# Patient Record
Sex: Female | Born: 1985
Health system: Southern US, Community
[De-identification: ages and names within clinical notes are randomized; demographics above are authoritative.]

## PROBLEM LIST (undated history)

## (undated) ENCOUNTER — Inpatient Hospital Stay (HOSPITAL_COMMUNITY): Payer: Self-pay

## (undated) DIAGNOSIS — I493 Ventricular premature depolarization: Secondary | ICD-10-CM

## (undated) DIAGNOSIS — K219 Gastro-esophageal reflux disease without esophagitis: Secondary | ICD-10-CM

## (undated) DIAGNOSIS — G4730 Sleep apnea, unspecified: Secondary | ICD-10-CM

## (undated) DIAGNOSIS — T7840XA Allergy, unspecified, initial encounter: Secondary | ICD-10-CM

## (undated) DIAGNOSIS — R0600 Dyspnea, unspecified: Secondary | ICD-10-CM

## (undated) DIAGNOSIS — Z95810 Presence of automatic (implantable) cardiac defibrillator: Secondary | ICD-10-CM

## (undated) DIAGNOSIS — I519 Heart disease, unspecified: Secondary | ICD-10-CM

## (undated) DIAGNOSIS — I509 Heart failure, unspecified: Secondary | ICD-10-CM

## (undated) DIAGNOSIS — J189 Pneumonia, unspecified organism: Secondary | ICD-10-CM

## (undated) DIAGNOSIS — D649 Anemia, unspecified: Secondary | ICD-10-CM

## (undated) DIAGNOSIS — I428 Other cardiomyopathies: Secondary | ICD-10-CM

## (undated) HISTORY — PX: CARDIAC CATHETERIZATION: SHX172

## (undated) HISTORY — DX: Other cardiomyopathies: I42.8

## (undated) HISTORY — DX: Heart disease, unspecified: I51.9

## (undated) HISTORY — DX: Allergy, unspecified, initial encounter: T78.40XA

## (undated) HISTORY — DX: Ventricular premature depolarization: I49.3

## (undated) HISTORY — PX: WISDOM TOOTH EXTRACTION: SHX21

## (undated) HISTORY — DX: Pneumonia, unspecified organism: J18.9

---
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
## 2003-02-23 ENCOUNTER — Inpatient Hospital Stay (HOSPITAL_COMMUNITY): Admission: AD | Admit: 2003-02-23 | Discharge: 2003-02-23 | Payer: Self-pay | Admitting: *Deleted

## 2005-05-10 ENCOUNTER — Ambulatory Visit: Payer: Self-pay | Admitting: Family Medicine

## 2005-05-15 ENCOUNTER — Ambulatory Visit: Payer: Self-pay | Admitting: Family Medicine

## 2005-05-16 ENCOUNTER — Ambulatory Visit: Payer: Self-pay | Admitting: *Deleted

## 2005-07-09 ENCOUNTER — Encounter: Payer: Self-pay | Admitting: Family Medicine

## 2005-07-09 ENCOUNTER — Ambulatory Visit: Payer: Self-pay | Admitting: Family Medicine

## 2006-03-08 ENCOUNTER — Ambulatory Visit: Payer: Self-pay | Admitting: Internal Medicine

## 2006-05-17 ENCOUNTER — Ambulatory Visit: Payer: Self-pay | Admitting: Internal Medicine

## 2006-05-28 ENCOUNTER — Ambulatory Visit: Payer: Self-pay | Admitting: Family Medicine

## 2006-06-19 ENCOUNTER — Ambulatory Visit: Payer: Self-pay | Admitting: Family Medicine

## 2006-06-21 ENCOUNTER — Ambulatory Visit: Payer: Self-pay | Admitting: Internal Medicine

## 2006-12-20 ENCOUNTER — Ambulatory Visit: Payer: Self-pay | Admitting: Internal Medicine

## 2006-12-25 ENCOUNTER — Encounter (INDEPENDENT_AMBULATORY_CARE_PROVIDER_SITE_OTHER): Payer: Self-pay | Admitting: *Deleted

## 2007-04-23 ENCOUNTER — Ambulatory Visit: Payer: Self-pay | Admitting: Family Medicine

## 2007-04-23 ENCOUNTER — Encounter (INDEPENDENT_AMBULATORY_CARE_PROVIDER_SITE_OTHER): Payer: Self-pay | Admitting: Nurse Practitioner

## 2007-04-23 LAB — CONVERTED CEMR LAB
ALT: 17 units/L (ref 0–35)
Alkaline Phosphatase: 70 units/L (ref 39–117)
BUN: 13 mg/dL (ref 6–23)
Basophils Relative: 0 % (ref 0–1)
Chloride: 103 meq/L (ref 96–112)
Creatinine, Ser: 0.69 mg/dL (ref 0.40–1.20)
Glucose, Bld: 94 mg/dL (ref 70–99)
HCT: 37.2 % (ref 36.0–46.0)
Lymphocytes Relative: 50 % — ABNORMAL HIGH (ref 12–46)
Lymphs Abs: 3.3 10*3/uL (ref 0.7–4.0)
Monocytes Absolute: 0.5 10*3/uL (ref 0.1–1.0)
Neutrophils Relative %: 39 % — ABNORMAL LOW (ref 43–77)
RBC: 4.71 M/uL (ref 3.87–5.11)
Total Bilirubin: 0.3 mg/dL (ref 0.3–1.2)
Total Protein: 7.6 g/dL (ref 6.0–8.3)

## 2007-05-08 ENCOUNTER — Emergency Department (HOSPITAL_COMMUNITY): Admission: EM | Admit: 2007-05-08 | Discharge: 2007-05-08 | Payer: Self-pay | Admitting: Family Medicine

## 2007-05-13 ENCOUNTER — Ambulatory Visit: Payer: Self-pay | Admitting: Internal Medicine

## 2007-06-04 ENCOUNTER — Encounter (INDEPENDENT_AMBULATORY_CARE_PROVIDER_SITE_OTHER): Payer: Self-pay | Admitting: Nurse Practitioner

## 2007-06-04 ENCOUNTER — Ambulatory Visit: Payer: Self-pay | Admitting: Family Medicine

## 2007-06-04 LAB — CONVERTED CEMR LAB
LDL Cholesterol: 148 mg/dL — ABNORMAL HIGH (ref 0–99)
Total CHOL/HDL Ratio: 4.6
VLDL: 18 mg/dL (ref 0–40)

## 2007-07-30 ENCOUNTER — Ambulatory Visit: Payer: Self-pay | Admitting: Family Medicine

## 2007-09-22 ENCOUNTER — Ambulatory Visit: Payer: Self-pay | Admitting: Internal Medicine

## 2008-06-16 ENCOUNTER — Ambulatory Visit: Payer: Self-pay | Admitting: Family Medicine

## 2008-07-05 ENCOUNTER — Encounter (INDEPENDENT_AMBULATORY_CARE_PROVIDER_SITE_OTHER): Payer: Self-pay | Admitting: Internal Medicine

## 2008-07-05 ENCOUNTER — Ambulatory Visit: Payer: Self-pay | Admitting: Internal Medicine

## 2008-07-05 LAB — CONVERTED CEMR LAB
BUN: 9 mg/dL (ref 6–23)
Creatinine, Ser: 0.66 mg/dL (ref 0.40–1.20)
Eosinophils Absolute: 0.2 10*3/uL (ref 0.0–0.7)
GC Probe Amp, Genital: NEGATIVE
Hemoglobin: 11.7 g/dL — ABNORMAL LOW (ref 12.0–15.0)
Lymphocytes Relative: 42 % (ref 12–46)
Lymphs Abs: 3.2 10*3/uL (ref 0.7–4.0)
MCHC: 34.2 g/dL (ref 30.0–36.0)
Monocytes Relative: 5 % (ref 3–12)
Neutro Abs: 3.9 10*3/uL (ref 1.7–7.7)
Platelets: 346 10*3/uL (ref 150–400)
Potassium: 3.9 meq/L (ref 3.5–5.3)
Prolactin: 6.6 ng/mL
RBC: 4.43 M/uL (ref 3.87–5.11)
RDW: 14.3 % (ref 11.5–15.5)
Sodium: 140 meq/L (ref 135–145)
WBC: 7.7 10*3/uL (ref 4.0–10.5)

## 2008-07-13 ENCOUNTER — Ambulatory Visit (HOSPITAL_COMMUNITY): Admission: RE | Admit: 2008-07-13 | Discharge: 2008-07-13 | Payer: Self-pay | Admitting: Internal Medicine

## 2008-11-04 ENCOUNTER — Ambulatory Visit: Payer: Self-pay | Admitting: Obstetrics and Gynecology

## 2008-11-05 ENCOUNTER — Ambulatory Visit: Payer: Self-pay | Admitting: Obstetrics & Gynecology

## 2009-10-25 ENCOUNTER — Emergency Department (HOSPITAL_COMMUNITY): Admission: EM | Admit: 2009-10-25 | Discharge: 2009-10-25 | Payer: Self-pay | Admitting: Family Medicine

## 2010-08-22 NOTE — Group Therapy Note (Signed)
NAME:  Tara Bradshaw, MISHKIN               ACCOUNT NO.:  [id number]   MEDICAL RECORD NO.:  [id number]          PATIENT TYPE:  WOC   LOCATION:  [facility]                   FACILITY:  [facility]   PHYSICIAN:  Dorthula Perfect, MD     DATE OF BIRTH:  1985/09/08   DATE OF SERVICE:                                  CLINIC NOTE   A 25 year old African American female, nulligravida, last menstrual  period Aug 16, 2008, is referred here from [facility] with the  diagnosis of amenorrhea.   The patient's menarche was at about age 58.  She has never had regular  periods.  Her menstrual periods for the past number of years have taken  place every 2-3 months.  Her periods are every 2 months that lasting  anywhere from 5-7 days and her periods had a few months interval, the  menstrual period might last from 10-14 days.  She describes the periods  as heavy and does stain her clothes and sometimes the bed sheets.  She  has moderate cramping.  As part of her evaluation at [facility], she  had an ultrasound, which revealed a normal uterus, revealed both ovaries  to be of normal size, but containing multiple small predominantly  peripherally oriented follicles.  No dominant follicle was identified.  This was certainly compatible with polycystic ovarian disease.  A Pap  smear was done March of this year, which was normal.   This patient is not desirous at this time of becoming pregnant.  She and  her husband use condoms.  Being low income, which there needs to be a  consideration as to what laboratory work is done at this time.   FAMILY HISTORY:  Negative.  There is no history of diabetes or breast  cancer.   MEDICATIONS:  She is on no medications.   ALLERGIES:  She has no allergies.   PHYSICAL EXAMINATION:  VITAL SIGNS:  Height 5 feet 2 1 inch, 225 pounds.  Blood pressure 127/83.  GENERAL:  There is no abnormal facial hair.  Thyroid is normal.  BREAST:  Not done today.  ABDOMEN:  Somewhat obese and  nontender.  No masses are felt.  PELVIC:  External genitalia, BUS glands were normal.  Normal female hair  pattern.  Vaginal vault was epithelialized as normal.  Cervix was  epithelialized as normal.  Uterus is of normal size and shape.  The  ovaries are not palpable on either side because of her body habitus.   DIAGNOSES:  1. Secondary amenorrhea.  2. Probable polycystic ovarian disease.   DISPOSITION:  1. The patient will return in the morning fasting for a fasting blood      sugar.  2. Because of her low income status and not desirous of pregnancy at      this time.  I will not put her on metformin or which would      necessitate baseline liver function tests and the BUN and      creatinine.  3. I will not order at this time any endocrine lab work.  She will      return in the morning  for a fasting blood sugar.  I have given her      a prescription for Provera 10 mg ten tablets to take one a day for      next 10 days depending on her periods.  Following that she will      start Ortho Tri-Cyclen low.  She was given two sample packs and a      prescription for a year.           ______________________________  Dorthula Perfect, MD     ER/MEDQ  D:  11/04/2008  T:  11/05/2008  Job:  [id number]   cc:   Dala Dock

## 2010-11-09 ENCOUNTER — Other Ambulatory Visit: Payer: Self-pay | Admitting: Family Medicine

## 2010-12-28 LAB — POCT PREGNANCY, URINE
Operator id: 239701
Preg Test, Ur: NEGATIVE

## 2010-12-28 LAB — INFLUENZA A AND B ANTIGEN (CONVERTED LAB)
Inflenza A Ag: NEGATIVE
Influenza B Ag: NEGATIVE

## 2011-02-06 ENCOUNTER — Inpatient Hospital Stay (INDEPENDENT_AMBULATORY_CARE_PROVIDER_SITE_OTHER)
Admission: RE | Admit: 2011-02-06 | Discharge: 2011-02-06 | Disposition: A | Discharge status: Home / Self Care | Payer: Self-pay | Source: Ambulatory Visit | Attending: Family Medicine | Admitting: Family Medicine

## 2011-02-06 DIAGNOSIS — J069 Acute upper respiratory infection, unspecified: Secondary | ICD-10-CM

## 2011-10-13 ENCOUNTER — Encounter (HOSPITAL_COMMUNITY): Payer: Self-pay | Admitting: Emergency Medicine

## 2011-10-13 ENCOUNTER — Emergency Department (HOSPITAL_COMMUNITY)
Admission: EM | Admit: 2011-10-13 | Discharge: 2011-10-13 | Disposition: A | Discharge status: Home / Self Care | Payer: Self-pay | Attending: Emergency Medicine | Admitting: Emergency Medicine

## 2011-10-13 DIAGNOSIS — S01312A Laceration without foreign body of left ear, initial encounter: Secondary | ICD-10-CM

## 2011-10-13 DIAGNOSIS — S01309A Unspecified open wound of unspecified ear, initial encounter: Secondary | ICD-10-CM | POA: Insufficient documentation

## 2011-10-13 DIAGNOSIS — W540XXA Bitten by dog, initial encounter: Secondary | ICD-10-CM | POA: Insufficient documentation

## 2011-10-13 DIAGNOSIS — Z23 Encounter for immunization: Secondary | ICD-10-CM | POA: Insufficient documentation

## 2011-10-13 DIAGNOSIS — Z203 Contact with and (suspected) exposure to rabies: Secondary | ICD-10-CM | POA: Insufficient documentation

## 2011-10-13 MED ORDER — RABIES VACCINE, PCEC IM SUSR
1.0000 mL | Freq: Once | INTRAMUSCULAR | Status: AC
  Administered 2011-10-13: 1 mL via INTRAMUSCULAR
  Filled 2011-10-13: qty 1

## 2011-10-13 MED ORDER — LIDOCAINE HCL (PF) 1 % IJ SOLN: 5.0000 mL | Freq: Once | INTRAMUSCULAR | Status: DC

## 2011-10-13 MED ORDER — BACITRACIN ZINC 500 UNIT/GM EX OINT
1.0000 "application " | TOPICAL_OINTMENT | Freq: Two times a day (BID) | CUTANEOUS | Status: DC
  Filled 2011-10-13: qty 15

## 2011-10-13 MED ORDER — NAPROXEN 500 MG PO TABS: 500.0000 mg | ORAL_TABLET | Freq: Two times a day (BID) | ORAL | Status: DC

## 2011-10-13 MED ORDER — RABIES IMMUNE GLOBULIN 150 UNIT/ML IM INJ
2100.0000 [IU] | INJECTION | Freq: Once | INTRAMUSCULAR | Status: AC
  Administered 2011-10-13: 2100 [IU] via INTRAMUSCULAR
  Filled 2011-10-13: qty 14

## 2011-10-13 MED ORDER — BACITRACIN ZINC 500 UNIT/GM EX OINT: TOPICAL_OINTMENT | Freq: Two times a day (BID) | CUTANEOUS | Status: AC

## 2011-10-13 MED ORDER — AMOXICILLIN-POT CLAVULANATE 875-125 MG PO TABS: 1.0000 | ORAL_TABLET | Freq: Two times a day (BID) | ORAL | Status: AC

## 2011-10-13 NOTE — ED Notes (Signed)
Pt states she was bitten on the left ear  by a stray dog at midnite.  States last tetanus 2 yrs ago,.  No known history of dog.   States left ear pain at Left lower lobe bit site 5/10, non bleeding at present.  NS irrigation in progress

## 2011-10-13 NOTE — ED Notes (Signed)
Pt states she was running from a stray dog just pta and fell.  Reports dog bit her L ear.  Denies injury/pain from fall.  Wound noted to L ear.  Bleeding controlled pta.  Unknown last DT. No information known on dog.

## 2011-10-13 NOTE — ED Provider Notes (Signed)
History     CSN: [id number]  Arrival date & time 10/13/11  0216   First MD Initiated Contact with Patient 10/13/11 [phone]      Chief Complaint  Patient presents with  . Animal Bite    (Consider location/radiation/quality/duration/timing/severity/associated sxs/prior treatment) HPI Comments: 26 year old female but states that she was bitten by a stray dog on her left ear. She states this occurred approximately 11:45 in the evening. This was acute in onset, the pain is constant, worse with palpation and not associated with any other injuries. She states that she did not know this dog, she was unable to run faster than a dog and when she tripped, the dog bit her on her left ear. She denies any significant bleeding and states she is up-to-date on tetanus within 2 years.  Patient is a 26 y.o. female presenting with animal bite. The history is provided by the patient and the spouse.  Animal Bite  Pertinent negatives include no numbness, no vomiting and no weakness.    History reviewed. No pertinent past medical history.  History reviewed. No pertinent past surgical history.  No family history on file.  History  Substance Use Topics  . Smoking status: Never Smoker   . Smokeless tobacco: Not on file  . Alcohol Use: No    OB History    Grav Para Term Preterm Abortions TAB SAB Ect Mult Living                  Review of Systems  Constitutional: Negative for fever.  Gastrointestinal: Negative for vomiting.  Skin: Positive for wound.       Laceration  Neurological: Negative for weakness and numbness.    Allergies  Review of patient's allergies indicates no known allergies.  Home Medications   Current Outpatient Rx  Name Route Sig Dispense Refill  . FERROUS GLUCONATE 324 (38 FE) MG PO TABS Oral Take 324 mg by mouth daily with breakfast.    . BACITRACIN ZINC 500 UNIT/GM EX OINT Topical Apply topically 2 (two) times daily. 15 g 0  . NAPROXEN 500 MG PO TABS Oral Take 1 tablet  (500 mg total) by mouth 2 (two) times daily with a meal. 30 tablet 0    BP 153/102  Pulse 82  Temp 98.1 F (36.7 C)  Wt 230 lb (104.327 kg)  SpO2 100%  LMP 09/09/2011  Physical Exam  Constitutional: She appears well-developed and well-nourished. No distress.  HENT:  Head: Normocephalic.       Laceration to the left earlobe  Eyes: Conjunctivae are normal. No scleral icterus.  Cardiovascular: Normal rate and regular rhythm.   Pulmonary/Chest: Effort normal and breath sounds normal.  Musculoskeletal: Normal range of motion. She exhibits tenderness ( ttp over the laceration site). She exhibits no edema.  Neurological: She is alert. Coordination normal.       Sensation and motor intact  Skin: Skin is warm and dry. She is not diaphoretic.       Laceration located on left earlobe The Laceration is linear and zig zag shaped The depth is superficial The length is 3 cm    ED Course  Procedures (including critical care time)  Labs Reviewed - No data to display No results found.   1. Laceration of left external ear   2. Dog bite       MDM  The location of the laceration makes it difficult to repair given its earlobe location wrapping around to the posterior portion of the  ear. Copious irrigation, rabies prophylaxis, tetanus is up-to-date.  LACERATION REPAIR Performed by: Vida Roller Authorized by: Vida Roller Consent: Verbal consent obtained. Risks and benefits: risks, benefits and alternatives were discussed Consent given by: patient Patient identity confirmed: provided demographic data Prepped and Draped in normal sterile fashion Wound explored  Laceration Location: Left earlobe  Laceration Length: 3 cm  No Foreign Bodies seen or palpated  Anesthesia: local infiltration  Local anesthetic: lidocaine 1 % without epinephrine perform by block of the entire left ear   Anesthetic total: 6 ml  Irrigation method: syringe Amount of cleaning: standard  Skin  closure: 6-0 Prolene   Number of sutures: 7   Technique: Simple interrupted   Patient tolerance: Patient tolerated the procedure well with no immediate complications.   I discussed with the patient at length the need for followup, I have given her a prescription for an antibiotic should her wounds become red or inflamed, she has expressed her understanding of the indications for return including signs of infection. She has had copious urination of more than 2 L of normal saline through the wound of her ear and there is no particulate matter in the wounds which have been explored to the base.   Vida Roller, MD 10/13/11 [phone]

## 2012-03-10 ENCOUNTER — Inpatient Hospital Stay (HOSPITAL_COMMUNITY)
Admission: AD | Admit: 2012-03-10 | Discharge: 2012-03-10 | Disposition: A | Discharge status: Home / Self Care | Payer: Medicaid Other | Source: Ambulatory Visit | Attending: Obstetrics & Gynecology | Admitting: Obstetrics & Gynecology

## 2012-03-10 ENCOUNTER — Encounter (HOSPITAL_COMMUNITY): Payer: Self-pay | Admitting: Family

## 2012-03-10 ENCOUNTER — Inpatient Hospital Stay (HOSPITAL_COMMUNITY): Payer: Medicaid Other

## 2012-03-10 DIAGNOSIS — O2390 Unspecified genitourinary tract infection in pregnancy, unspecified trimester: Secondary | ICD-10-CM

## 2012-03-10 DIAGNOSIS — O210 Mild hyperemesis gravidarum: Secondary | ICD-10-CM

## 2012-03-10 DIAGNOSIS — Z3490 Encounter for supervision of normal pregnancy, unspecified, unspecified trimester: Secondary | ICD-10-CM

## 2012-03-10 DIAGNOSIS — R109 Unspecified abdominal pain: Secondary | ICD-10-CM | POA: Insufficient documentation

## 2012-03-10 DIAGNOSIS — B9689 Other specified bacterial agents as the cause of diseases classified elsewhere: Secondary | ICD-10-CM | POA: Insufficient documentation

## 2012-03-10 DIAGNOSIS — N760 Acute vaginitis: Secondary | ICD-10-CM | POA: Insufficient documentation

## 2012-03-10 DIAGNOSIS — A499 Bacterial infection, unspecified: Secondary | ICD-10-CM | POA: Insufficient documentation

## 2012-03-10 HISTORY — DX: Anemia, unspecified: D64.9

## 2012-03-10 LAB — URINALYSIS, ROUTINE W REFLEX MICROSCOPIC
Bilirubin Urine: NEGATIVE
Glucose, UA: NEGATIVE mg/dL
Ketones, ur: NEGATIVE mg/dL
Leukocytes, UA: NEGATIVE
Specific Gravity, Urine: 1.02 (ref 1.005–1.030)
pH: 7.5 (ref 5.0–8.0)

## 2012-03-10 LAB — CBC
HCT: 33.7 % — ABNORMAL LOW (ref 36.0–46.0)
Hemoglobin: 11.4 g/dL — ABNORMAL LOW (ref 12.0–15.0)
MCH: 26.9 pg (ref 26.0–34.0)
MCHC: 33.8 g/dL (ref 30.0–36.0)
MCV: 79.5 fL (ref 78.0–100.0)
RDW: 13.5 % (ref 11.5–15.5)

## 2012-03-10 LAB — WET PREP, GENITAL
WBC, Wet Prep HPF POC: NONE SEEN
Yeast Wet Prep HPF POC: NONE SEEN

## 2012-03-10 LAB — OB RESULTS CONSOLE GC/CHLAMYDIA
Chlamydia: NEGATIVE
Gonorrhea: NEGATIVE

## 2012-03-10 LAB — HCG, QUANTITATIVE, PREGNANCY: hCG, Beta Chain, Quant, S: 39357 m[IU]/mL — ABNORMAL HIGH (ref <5)

## 2012-03-10 MED ORDER — PROMETHAZINE HCL 25 MG PO TABS: 25.0000 mg | ORAL_TABLET | Freq: Four times a day (QID) | ORAL | Status: DC | PRN

## 2012-03-10 MED ORDER — METRONIDAZOLE 500 MG PO TABS: 500.0000 mg | ORAL_TABLET | Freq: Two times a day (BID) | ORAL | Status: DC

## 2012-03-10 NOTE — MAU Provider Note (Signed)
History     CSN: [id number]  Arrival date and time: 03/10/12 1128   First Provider Initiated Contact with Patient 03/10/12 1217      Chief Complaint  Patient presents with  . Possible Pregnancy  . Vaginal Pain  . Abdominal Pain  . Rectal Pain  . Nausea   HPI This is a 26 y.o. female at [redacted]w[redacted]d who presents with c/o nausea, abdominal and breast pain. Has never been pregnant, is not on birth control. Was diagnosed many years ago with PCOS, though no testing was done due to finances. Has always had regular periods however, but has not gotten pregnant before, even in absence of contraception.  RN Note: Patient states she has been having nausea for a while, no vomiting. Started having abdominal, vaginal, rectal and breast pain for couple of weeks. Has missed her period since September.       OB History    Grav Para Term Preterm Abortions TAB SAB Ect Mult Living   1               Past Medical History  Diagnosis Date  . Anemia     Past Surgical History  Procedure Date  . Wisdom tooth extraction     History reviewed. No pertinent family history.  History  Substance Use Topics  . Smoking status: Never Smoker   . Smokeless tobacco: Not on file  . Alcohol Use: No    Allergies: No Known Allergies  Prescriptions prior to admission  Medication Sig Dispense Refill  . Pediatric Multivitamins-Iron (FLINTSTONES PLUS IRON PO) Take 2 tablets by mouth daily.        ROS See HPI  Physical Exam   Blood pressure 126/74, pulse 69, temperature 98 F (36.7 C), temperature source Oral, resp. rate 18, height 5' 2.5" (1.588 m), weight 230 lb 12.8 oz (104.69 kg), last menstrual period 12/12/2011, SpO2 100.00%.  Physical Exam  Constitutional: She is oriented to person, place, and time. She appears well-developed and well-nourished. No distress.  HENT:  Head: Normocephalic.  Cardiovascular: Normal rate.   Respiratory: Effort normal.  GI: Soft. She exhibits no distension and no  mass. There is no tenderness. There is no rebound and no guarding.  Genitourinary: Uterus normal. No vaginal discharge found.       Uterus enlarged, but difficult to feel due to habitus Bedside US, able to see gestational sac, but unclear as to contents   Musculoskeletal: Normal range of motion.  Neurological: She is alert and oriented to person, place, and time.  Skin: Skin is warm and dry.  Psychiatric: She has a normal mood and affect.   Results for orders placed during the hospital encounter of 03/10/12 (from the past 24 hour(s))  URINALYSIS, ROUTINE W REFLEX MICROSCOPIC     Status: Normal   Collection Time   03/10/12 11:45 AM      Component Value Range   Color, Urine YELLOW  YELLOW   APPearance CLEAR  CLEAR   Specific Gravity, Urine 1.020  1.005 - 1.030   pH 7.5  5.0 - 8.0   Glucose, UA NEGATIVE  NEGATIVE mg/dL   Hgb urine dipstick NEGATIVE  NEGATIVE   Bilirubin Urine NEGATIVE  NEGATIVE   Ketones, ur NEGATIVE  NEGATIVE mg/dL   Protein, ur NEGATIVE  NEGATIVE mg/dL   Urobilinogen, UA 0.2  0.0 - 1.0 mg/dL   Nitrite NEGATIVE  NEGATIVE   Leukocytes, UA NEGATIVE  NEGATIVE  POCT PREGNANCY, URINE     Status: Abnormal  Collection Time   03/10/12 11:53 AM      Component Value Range   Preg Test, Ur POSITIVE (*) NEGATIVE  POCT PREGNANCY, URINE     Status: Abnormal   Collection Time   03/10/12 12:18 PM      Component Value Range   Preg Test, Ur POSITIVE (*) NEGATIVE  WET PREP, GENITAL     Status: Abnormal   Collection Time   03/10/12 12:25 PM      Component Value Range   Yeast Wet Prep HPF POC NONE SEEN  NONE SEEN   Trich, Wet Prep NONE SEEN  NONE SEEN   Clue Cells Wet Prep HPF POC FEW (*) NONE SEEN   WBC, Wet Prep HPF POC NONE SEEN  NONE SEEN  CBC     Status: Abnormal   Collection Time   03/10/12 12:27 PM      Component Value Range   WBC 8.7  4.0 - 10.5 K/uL   RBC 4.24  3.87 - 5.11 MIL/uL   Hemoglobin 11.4 (*) 12.0 - 15.0 g/dL   HCT 16.1 (*) 09.6 - 04.5 %   MCV 79.5  78.0 -  100.0 fL   MCH 26.9  26.0 - 34.0 pg   MCHC 33.8  30.0 - 36.0 g/dL   RDW 40.9  81.1 - 91.4 %   Platelets 286  150 - 400 K/uL  HCG, QUANTITATIVE, PREGNANCY     Status: Abnormal   Collection Time   03/10/12 12:28 PM      Component Value Range   hCG, Beta Chain, Quant, S 78295 (*) <5 mIU/mL    MAU Course  Procedures  MDM Will check Quant, CBC and [place] >> SIUP at 8 wks with [facility] 10/20/12 Rx Phenergan US Ob Comp Less 14 Wks    Assessment and Plan  A:  SIUP at 8 weeks       Nausea of pregnancy       BV, mild  P:  Discharge      Rx Phenergan      Advised to start prenatal care        Medication List     As of 03/10/2012  6:44 PM    START taking these medications         metroNIDAZOLE 500 MG tablet   Commonly known as: FLAGYL   Take 1 tablet (500 mg total) by mouth 2 (two) times daily.      promethazine 25 MG tablet   Commonly known as: PHENERGAN   Take 1 tablet (25 mg total) by mouth every 6 (six) hours as needed for nausea.      CONTINUE taking these medications         FLINTSTONES PLUS IRON PO          Where to get your medications    These are the prescriptions that you need to pick up. We sent them to a specific pharmacy, so you will need to go there to get them.   [id number] [place], [place] - [address]    [address] [place] [address]    Phone: [phone]        metroNIDAZOLE 500 MG tablet   promethazine 25 MG tablet             Domnick Chervenak 03/10/2012, 1:10 PM

## 2012-03-10 NOTE — MAU Note (Signed)
Pt reports missed period, LMP 12/12/11; generalized lower abdominal cramping and breasts are tender.

## 2012-03-10 NOTE — MAU Note (Signed)
Patient states she has been having nausea for a while, no vomiting. Started having abdominal, vaginal, rectal and breast pain for couple of weeks. Has missed her period since September.

## 2012-03-10 NOTE — Progress Notes (Signed)
03/10/12 1500  Clinical Encounter Type  Visited With Patient  Visit Type Initial;Spiritual support;Social support  Referral From Nurse  Spiritual Encounters  Spiritual Needs Emotional  Stress Factors  Patient Stress Factors Major life changes    Thank you to RN Christy for referral to provide support to Ms Tara Bradshaw upon her learning of a positive pregnancy.  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  Provided opportunity for her to share and process her thoughts and feelings about her pregnancy news and to begin to imagine her future as a parent.  Served as pastoral presence, witness to her story, pastoral reflection partner.  Also let her know of support available from Spiritual Care upon subsequent visits/admissions.  Pt receptive, appreciative.  [address] [place], [place] [phone]

## 2012-03-11 LAB — GC/CHLAMYDIA PROBE AMP
CT Probe RNA: NEGATIVE
GC Probe RNA: NEGATIVE

## 2012-03-26 ENCOUNTER — Ambulatory Visit (INDEPENDENT_AMBULATORY_CARE_PROVIDER_SITE_OTHER): Payer: Medicaid Other | Admitting: Obstetrics and Gynecology

## 2012-03-26 DIAGNOSIS — Z36 Encounter for antenatal screening of mother: Secondary | ICD-10-CM

## 2012-03-26 DIAGNOSIS — Z331 Pregnant state, incidental: Secondary | ICD-10-CM

## 2012-03-26 LAB — POCT URINALYSIS DIPSTICK
Bilirubin, UA: NEGATIVE
Blood, UA: NEGATIVE
Clarity, UA: NEGATIVE
Glucose, UA: NEGATIVE
Nitrite, UA: NEGATIVE
Spec Grav, UA: 1.015
Urobilinogen, UA: NEGATIVE

## 2012-03-26 MED ORDER — FUSION PLUS PO CAPS: 1.0000 | ORAL_CAPSULE | Freq: Every day | ORAL | Status: DC

## 2012-03-26 NOTE — Progress Notes (Signed)
NOB Interview. Pt needs to sign ROI for family member at [place]. Completed but did not sign.

## 2012-03-26 NOTE — Addendum Note (Signed)
Addended by: Malissa Hippo. on: 03/26/2012 11:46 PM   Modules accepted: Orders

## 2012-03-27 LAB — PRENATAL PANEL VII
Antibody Screen: NEGATIVE
Basophils Absolute: 0 10*3/uL (ref 0.0–0.1)
Basophils Relative: 0 % (ref 0–1)
Eosinophils Absolute: 0.2 10*3/uL (ref 0.0–0.7)
Eosinophils Relative: 2 % (ref 0–5)
HCT: 32.3 % — ABNORMAL LOW (ref 36.0–46.0)
HIV: NONREACTIVE
Hemoglobin: 10.6 g/dL — ABNORMAL LOW (ref 12.0–15.0)
MCH: 26.3 pg (ref 26.0–34.0)
MCHC: 32.8 g/dL (ref 30.0–36.0)
MCV: 80.1 fL (ref 78.0–100.0)
Monocytes Absolute: 0.6 10*3/uL (ref 0.1–1.0)
Monocytes Relative: 7 % (ref 3–12)
RDW: 15.3 % (ref 11.5–15.5)
Rh Type: POSITIVE

## 2012-03-28 ENCOUNTER — Other Ambulatory Visit: Payer: Self-pay | Admitting: Obstetrics and Gynecology

## 2012-03-28 ENCOUNTER — Telehealth: Payer: Self-pay | Admitting: Obstetrics and Gynecology

## 2012-03-28 LAB — CULTURE, OB URINE: Colony Count: NO GROWTH

## 2012-03-28 LAB — HEMOGLOBINOPATHY EVALUATION
Hgb A2 Quant: 2.6 % (ref 2.2–3.2)
Hgb F Quant: 0 % (ref 0.0–2.0)

## 2012-03-28 NOTE — Telephone Encounter (Signed)
Message copied by Mason Jim on Fri Mar 28, 2012 12:26 PM ------      Message from: Malissa Hippo.      Created: Wed Mar 26, 2012 11:46 PM      Regarding: Please call pt to notify hgb low, FE suppl ordered, sent to pharmacy                    ----- Message -----         From: Constance Haw, RN         Sent: 03/26/2012  12:29 PM           To: Malissa Hippo, CNM

## 2012-03-28 NOTE — Telephone Encounter (Signed)
TC to pt. Informed of Hg results and Rx for Fe. Advised to eat Fe-rich foods and take Rx with Vit C source not dairy. Pt verbalizes comprehension.

## 2012-04-09 NOTE — L&D Delivery Note (Signed)
Delivery Note At 3:16 PM a viable female was delivered via Vaginal, Spontaneous Delivery (Presentation: Left Occiput Anterior).  Loose nuchal and body cord, summersault maneuver done and nuchal cord easily reduced after delivery.  APGAR: 9, 9; weight 6 lb 7.2 oz (2925 g).  Placenta status: Intact, Spontaneous.  Cord: 3 vessels with the following complications: None.  Cord pH: not done  Anesthesia: Epidural  Episiotomy: None Lacerations: None Suture Repair: n/a Est. Blood Loss (mL): 200  Mom to postpartum.  Baby to skin to skin immediately after delivery. Desires in-office circ Desires Mi[employer] for [employer] 10/14/2012, 5:01 PM

## 2012-04-14 ENCOUNTER — Ambulatory Visit (INDEPENDENT_AMBULATORY_CARE_PROVIDER_SITE_OTHER): Payer: Medicaid Other

## 2012-04-14 ENCOUNTER — Other Ambulatory Visit: Payer: Self-pay | Admitting: Obstetrics and Gynecology

## 2012-04-14 DIAGNOSIS — Z36 Encounter for antenatal screening of mother: Secondary | ICD-10-CM

## 2012-04-21 ENCOUNTER — Encounter: Payer: Self-pay | Admitting: Obstetrics and Gynecology

## 2012-04-21 ENCOUNTER — Ambulatory Visit (INDEPENDENT_AMBULATORY_CARE_PROVIDER_SITE_OTHER): Payer: Medicaid Other | Admitting: Obstetrics and Gynecology

## 2012-04-21 VITALS — BP 110/64 | Temp 97.3°F | Wt 229.0 lb

## 2012-04-21 DIAGNOSIS — R05 Cough: Secondary | ICD-10-CM

## 2012-04-21 DIAGNOSIS — Z3689 Encounter for other specified antenatal screening: Secondary | ICD-10-CM

## 2012-04-21 DIAGNOSIS — O0930 Supervision of pregnancy with insufficient antenatal care, unspecified trimester: Secondary | ICD-10-CM | POA: Insufficient documentation

## 2012-04-21 DIAGNOSIS — Z124 Encounter for screening for malignant neoplasm of cervix: Secondary | ICD-10-CM

## 2012-04-21 DIAGNOSIS — Z331 Pregnant state, incidental: Secondary | ICD-10-CM

## 2012-04-21 DIAGNOSIS — E669 Obesity, unspecified: Secondary | ICD-10-CM

## 2012-04-21 DIAGNOSIS — Z3490 Encounter for supervision of normal pregnancy, unspecified, unspecified trimester: Secondary | ICD-10-CM

## 2012-04-21 DIAGNOSIS — R059 Cough, unspecified: Secondary | ICD-10-CM

## 2012-04-21 DIAGNOSIS — R053 Chronic cough: Secondary | ICD-10-CM

## 2012-04-21 LAB — POCT URINALYSIS DIPSTICK
Blood, UA: NEGATIVE
Glucose, UA: NEGATIVE
Nitrite, UA: NEGATIVE
Spec Grav, UA: 1.02
Urobilinogen, UA: NEGATIVE
pH, UA: 6

## 2012-04-21 MED ORDER — HYDROCOD POLST-CHLORPHEN POLST 10-8 MG/5ML PO LQCR: 5.0000 mL | Freq: Two times a day (BID) | ORAL | Status: DC

## 2012-04-21 NOTE — Progress Notes (Signed)
[redacted]w[redacted]d Last pap smear 2011 WNL LMP 01/11/2012 Declines Flu vaccine today due to cold symptoms. OTC meds not working. Pt complains of bad cough x 1 month with sneezing.

## 2012-04-21 NOTE — Progress Notes (Signed)
[facility] NEW OB EXAMINATION   Tara Bradshaw is a 27 y.o. female, G1P0, who presents at [redacted]w[redacted]d gestation for a new obstetrical examination. Her last menstrual period is not certain.  She had an ultrasound at the [facility] on March 10, 2012 at which time she was [redacted] weeks pregnant. Her assigned a due date was October 20, 2012.  She was seen for nausea and vomiting.  This is now resolved. The patient complains of a chronic cough.  Robitussin has not been helpful.  The following portions of the patient's history were reviewed and updated as appropriate: allergies, current medications, past family history, past medical history, past social history, past surgical history and problem list.  OB History    Grav Para Term Preterm Abortions TAB SAB Ect Mult Living   1               Past Medical History  Diagnosis Date  . Pneumonia     x 1  . Infection     BV X 1  . Anemia     CHRONIC    Past Surgical History  Procedure Date  . Wisdom tooth extraction     Family History  Problem Relation Age of Onset  . Epilepsy Mother   . Cancer Maternal Aunt 52    BREAST  . Hypertension Maternal Grandmother   . Cancer Paternal Grandmother     BREAST    Social History:  reports that she has never smoked. She has never used smokeless tobacco. She reports that she does not drink alcohol or use illicit drugs.  Allergies: No Known Allergies  Medications: prenatal vitamins   Objective:    BP 110/64  Temp 97.3 F (36.3 C)  Wt 229 lb (103.874 kg)  LMP 12/12/2011    Weight:  Wt Readings from Last 1 Encounters:  04/21/12 229 lb (103.874 kg)          BMI: There is no height on file to calculate BMI.  General Appearance: Alert, appropriate appearance for age. No acute distress HEENT: Grossly normal Neck / Thyroid: Supple, no masses, nodes or enlargement Lungs: clear to auscultation bilaterally Back: No CVA tenderness Breast Exam: No masses or nodes.No dimpling, nipple  retraction or discharge. Cardiovascular: Regular rate and rhythm. S1, S2, no murmur Gastrointestinal: Soft, non-tender, no masses or organomegaly.                               Fundal height: 14 weeks                               Fetal heart tones audible: yes  ++++++++++++++++++++++++++++++++++++++++++++++++++++++++  Pelvic Exam: External genitalia: normal general appearance Vaginal: normal without tenderness, induration or masses and relaxation: No Cervix: normal appearance Adnexa: normal bimanual exam Uterus: gravid, nontender, 14 weeks size  ++++++++++++++++++++++++++++++++++++++++++++++++++++++++  Lymphatic Exam: Non-palpable nodes in neck, clavicular, axillary, or inguinal regions Neurologic: Normal speech, no tremor  Psychiatric: Alert and oriented, appropriate affect.  Prenatal labs: ABO, Rh: O/POS/-- (12/18 1149) Antibody: NEG (12/18 1149) Rubella:  immune RPR: NON REAC (12/18 1149)  HBsAg: NEGATIVE (12/18 1149)  HIV: NON REACTIVE (12/18 1149)  GBS:   pending until the third trimester Gonorrhea: Negative Chlamydia: Negative Sickle cell: Normal adult hemoglobin Urine culture: Negative Wet prep: Clue cells (treated with metronidazole) First trimester screen: Normal Hemoglobin: 10.6 Platelets: 300,000  Wet  Prep:   Previously done:            yes                 Assessment:   27 y.o. female G1P0 at [redacted]w[redacted]d gestation ( EDC is October 20, 2012) by: Ultrasound:  March 10, 2012 at [redacted] weeks gestation.                                                             Obesity  Anemia  Late prenatal care  Chronic cough probably secondary to viral illness   Plan:    Pap smear sent (last pain in 2011)  We discussed routine pregnancy issues:  Toxoplasmosis was reviewed.  The patient was told to avoid cat liter boxes and feces.  The patient was told to avoid predator fish including tuna because of our concerns for mercury consumption.  The patient was told to avoid soft  cheeses.  The patient was told to be sure that all lunch meats are well cooked.  Genetic screening was discussed. Normal first trimester screen  Our model for pregnancy management was reviewed.  Proper diet and exercise reviewed.  Return to office in 4 weeks.  Medications include:  Prenatal vitamins Tussionex as needed for cough.  Mylinda Latina.D.

## 2012-04-22 LAB — PAP IG, CT-NG, RFX HPV ASCU
Chlamydia Probe Amp: NEGATIVE
GC Probe Amp: NEGATIVE

## 2012-04-23 ENCOUNTER — Telehealth: Payer: Self-pay | Admitting: Obstetrics and Gynecology

## 2012-04-23 ENCOUNTER — Telehealth: Payer: Self-pay

## 2012-04-23 NOTE — Telephone Encounter (Signed)
Pt stated Rx that was called in for her cough/cold is not covered by Medicaid. ( Tussionex) Rx is $80.00 Pt needs something cheaper. Will consult w/ AVS.   LC CMA

## 2012-04-23 NOTE — Telephone Encounter (Signed)
avs pt 

## 2012-04-23 NOTE — Telephone Encounter (Signed)
Tussionex was printed on Monday. Rx needed to be called in  Tussionex called into pt pharm. Pt called to be made aware  Pt not ava LMOVM  LC CMA

## 2012-04-24 ENCOUNTER — Telehealth: Payer: Self-pay | Admitting: Obstetrics and Gynecology

## 2012-04-24 NOTE — Telephone Encounter (Signed)
Pt made aware per AVS to try Robitussin AC 10ml every 4-6 hrs.  Pt agreeable   LC CMA

## 2012-05-01 ENCOUNTER — Encounter: Payer: Self-pay | Admitting: Internal Medicine

## 2012-05-01 ENCOUNTER — Telehealth: Payer: Self-pay | Admitting: Obstetrics and Gynecology

## 2012-05-01 ENCOUNTER — Ambulatory Visit (INDEPENDENT_AMBULATORY_CARE_PROVIDER_SITE_OTHER): Payer: Medicaid Other | Admitting: Internal Medicine

## 2012-05-01 VITALS — BP 122/60 | HR 78 | Temp 98.3°F | Ht 62.0 in | Wt 231.8 lb

## 2012-05-01 DIAGNOSIS — R05 Cough: Secondary | ICD-10-CM

## 2012-05-01 DIAGNOSIS — R059 Cough, unspecified: Secondary | ICD-10-CM

## 2012-05-01 MED ORDER — ACETAMINOPHEN-CODEINE #3 300-30 MG PO TABS: ORAL_TABLET | ORAL | Status: DC

## 2012-05-01 MED ORDER — LANSOPRAZOLE 30 MG PO CPDR: DELAYED_RELEASE_CAPSULE | ORAL | Status: DC

## 2012-05-01 NOTE — Progress Notes (Signed)
  Subjective:    Patient ID: Tara Bradshaw, female    DOB: 03/16/1986   MRN: [id number]  HPI  69 yobf never smoker referred by Nigel Bridgeman during 1st prgenancy for refractory cough.    05/01/2012 1st pulmonary eval cc cough x 10 weeks and pregnant x 16 weeks, cough is daily, assoc with nasal congestion and sneezing but no problem before pregnancy assoc with dry cough to point of choking and vomiting.  Worse starting 8pm and right thru the night but better in am until around noon. No  Better with delsym, rob, afrin  No obvious daytime variabilty or assoc  cp or chest tightness, subjective wheeze overt sinus or hb symptoms. No unusual exp hx. Only really sob when couging   a. Also denies any obvious fluctuation of symptoms with weather or environmental changes or other aggravating or alleviating factors except as outlined above   No previous h/o allergies or asthma or prolonged cough with uri's       Review of Systems  Constitutional: Negative for fever, chills and unexpected weight change.  HENT: Positive for congestion and sneezing. Negative for ear pain, nosebleeds, sore throat, rhinorrhea, trouble swallowing, dental problem, voice change, postnasal drip and sinus pressure.   Eyes: Negative for visual disturbance.  Respiratory: Positive for cough and shortness of breath. Negative for choking.   Cardiovascular: Negative for chest pain and leg swelling.  Gastrointestinal: Negative for vomiting, abdominal pain and diarrhea.  Genitourinary: Negative for difficulty urinating.  Musculoskeletal: Negative for arthralgias.  Skin: Negative for rash.  Neurological: Negative for tremors, syncope and headaches.  Hematological: Does not bruise/bleed easily.       Objective:   Physical Exam  Wt Readings from Last 3 Encounters:  05/01/12 231 lb 12.8 oz (105.144 kg)  04/21/12 229 lb (103.874 kg)  03/10/12 230 lb 12.8 oz (104.69 kg)    HEENT: nl dentition,  and orophanx. Nl external ear  canals without cough reflex- mod non-specific turbinate edema bilaterally no purulent secretions   NECK :  without JVD/Nodes/TM/ nl carotid upstrokes bilaterally   LUNGS: no acc muscle use, clear to A and P bilaterally without cough on insp or exp maneuvers   CV:  RRR  no s3 or murmur or increase in P2, no edema   ABD:  soft and nontender c/w [redacted] week gestation with nl excursion in the supine position. No bruits or organomegaly, bowel sounds nl  MS:  warm without deformities, calf tenderness, cyanosis or clubbing  SKIN: warm and dry without lesions    NEURO:  alert, approp, no deficits        Assessment & Plan:

## 2012-05-01 NOTE — Patient Instructions (Addendum)
Prevacid 30 mg Take 30- 60 min before your first and last meals of the day   GERD (REFLUX)  is an extremely common cause of respiratory symptoms, many times with no significant heartburn at all.    It can be treated with medication, but also with lifestyle changes including avoidance of late meals, excessive alcohol, smoking cessation, and avoid fatty foods, chocolate, peppermint, colas, red wine, and acidic juices such as orange juice.  NO MINT OR MENTHOL PRODUCTS SO NO COUGH DROPS  USE SUGARLESS CANDY INSTEAD (jolley ranchers or Stover's)  NO OIL BASED VITAMINS - use powdered substitutes.   If still coughing ok to use Tylenol #3 one every 4 hours as needed  Please schedule a follow up office visit in 4 weeks, sooner if needed

## 2012-05-01 NOTE — Telephone Encounter (Signed)
Tc to Tara Bradshaw regarding msg.  Tara Bradshaw is 15 weeks and has a persistent cough that has not gone away with taking Robitussin and Delsym and Tussinex, Tessalon Pearls are not covered by the Tara Bradshaw's insurance.  Tara Bradshaw has had the cough since about 6 wks of her pregnancy.  Cough is a dry/hacking cough. Tara Bradshaw does not have a hx of asthma.  Per VL, refer Tara Bradshaw to [facility] pulmonology to see if she has asthma, Tara Bradshaw voices agreement.  Appt scheduled for today @ 1600, Tara Bradshaw to arrive @ 1615 to see Dr. Sandrea Hughs.

## 2012-05-02 ENCOUNTER — Telehealth: Payer: Self-pay | Admitting: Internal Medicine

## 2012-05-02 NOTE — Telephone Encounter (Signed)
I spoke with pt and advised her it is bc it is OTC being why insurance will not cover medication. She voiced her understanding and needed nothing further.

## 2012-05-04 DIAGNOSIS — R05 Cough: Secondary | ICD-10-CM | POA: Insufficient documentation

## 2012-05-04 DIAGNOSIS — R059 Cough, unspecified: Secondary | ICD-10-CM | POA: Insufficient documentation

## 2012-05-04 NOTE — Assessment & Plan Note (Signed)
Cough onset  @ around [redacted] weeks gestation typical of  Classic Upper airway cough syndrome, so named because it's frequently impossible to sort out how much is  CR/sinusitis with freq throat clearing (which can be related to primary GERD)   vs  causing  secondary (" extra esophageal")  GERD from wide swings in gastric pressure that occur with throat clearing, often  promoting self use of mint and menthol lozenges that reduce the lower esophageal sphincter tone and exacerbate the problem further in a cyclical fashion.   These are the same pts (now being labeled as having "irritable larynx syndrome" by some cough centers) who not infrequently have a history of having failed to tolerate ace inhibitors,  dry powder inhalers or biphosphonates or report having atypical reflux symptoms that don't respond to standard doses of PPI , and are easily confused as having aecopd or asthma flares by even experienced allergists/ pulmonologists.     clearly refluxing if she coughs so hard she gags and vomits and this can't be good for her baby so no real options here other than cough suppression with codeine and max acid rx - See instructions for specific recommendations which were reviewed directly with the patient who was given a copy with highlighter outlining the key components.

## 2012-05-13 ENCOUNTER — Telehealth: Payer: Self-pay | Admitting: Obstetrics and Gynecology

## 2012-05-13 NOTE — Telephone Encounter (Signed)
Pt called, is 17 weeks, was recently dx'd w/ GERD after a referral by this office.  Has been unable to keep anything down b/c when she coughs she vomits.  Pt was given Prevacid and Tylenol w/ Codeine for relief, however pt has only taken the Prevacid and has not taken the Tylenol w/ Codeine b/c she is afraid it will harm the baby.  Pt advised it is ok to take, that is a med that we rx to pregnant pt's frequently if needed and she is in her 2nd trimester which makes it safer.  Pt voices agreement, will try the Tylenol #3 and the Prevacid as suggested and will call back if no relief.

## 2012-05-19 ENCOUNTER — Ambulatory Visit: Payer: Medicaid Other

## 2012-05-19 ENCOUNTER — Ambulatory Visit: Payer: Medicaid Other | Admitting: Obstetrics and Gynecology

## 2012-05-19 ENCOUNTER — Encounter: Payer: Self-pay | Admitting: Obstetrics and Gynecology

## 2012-05-19 VITALS — BP 110/72 | Wt 226.0 lb

## 2012-05-19 DIAGNOSIS — Z331 Pregnant state, incidental: Secondary | ICD-10-CM

## 2012-05-19 DIAGNOSIS — Z3689 Encounter for other specified antenatal screening: Secondary | ICD-10-CM

## 2012-05-19 NOTE — Progress Notes (Signed)
[redacted]w[redacted]d No complaints per pt  Ultrasound shows:  SIUP  S=D     [place] EDD:10/20/12           AFI: fluid is normal (vertical pocket 3.9 cm)           Cervical length: 3.30 cm           Placenta localization: right lateral placenta (placenta edge is 6.2 cm from internal OS-normal)           Fetal presentation: vertex                   Anatomy survey is normal           Gender : female  Posterior intramural fibroid = 3.1 x 2.2 x 2.6 cm

## 2012-05-19 NOTE — Progress Notes (Signed)
[redacted]w[redacted]d Doing well. Declines a AFP screen. Ultrasound: Single gestation, normal anatomy, normal fluid, female, 8 ounces (54th percentile), cervix equals 3.3 cm. Return to office in 4 weeks. Dr. Stefano Gaul

## 2012-05-23 LAB — US OB COMP + 14 WK

## 2012-05-24 ENCOUNTER — Other Ambulatory Visit: Payer: Self-pay

## 2012-06-02 ENCOUNTER — Ambulatory Visit: Payer: Medicaid Other | Admitting: Internal Medicine

## 2012-06-16 ENCOUNTER — Ambulatory Visit: Payer: Medicaid Other | Admitting: Certified Nurse Midwife

## 2012-06-16 ENCOUNTER — Encounter: Payer: Medicaid Other | Admitting: Obstetrics and Gynecology

## 2012-06-16 VITALS — BP 112/68 | Wt 230.0 lb

## 2012-06-16 DIAGNOSIS — Z331 Pregnant state, incidental: Secondary | ICD-10-CM

## 2012-06-16 NOTE — Progress Notes (Signed)
[redacted]w[redacted]d Pt only reports sciatic discomfort and nipple dryness and sensitivity.  Reassurance given and comfort measure options offered. Rv'd glucola for next visit if done. Will RTC in 4 weeks.

## 2012-06-16 NOTE — Progress Notes (Signed)
Pt stated her nipples been very dry been going on for about a month . Pt no other issues today.

## 2012-06-25 ENCOUNTER — Ambulatory Visit: Payer: Medicaid Other | Admitting: Internal Medicine

## 2012-07-09 ENCOUNTER — Encounter: Payer: Self-pay | Admitting: Family Medicine

## 2012-09-17 ENCOUNTER — Inpatient Hospital Stay (HOSPITAL_COMMUNITY)
Admission: AD | Admit: 2012-09-17 | Discharge: 2012-09-17 | Disposition: A | Discharge status: Home / Self Care | Payer: Medicaid Other | Source: Ambulatory Visit | Attending: Obstetrics and Gynecology | Admitting: Obstetrics and Gynecology

## 2012-09-17 ENCOUNTER — Encounter (HOSPITAL_COMMUNITY): Payer: Self-pay

## 2012-09-17 DIAGNOSIS — M549 Dorsalgia, unspecified: Secondary | ICD-10-CM | POA: Insufficient documentation

## 2012-09-17 DIAGNOSIS — R112 Nausea with vomiting, unspecified: Secondary | ICD-10-CM

## 2012-09-17 DIAGNOSIS — O99891 Other specified diseases and conditions complicating pregnancy: Secondary | ICD-10-CM | POA: Insufficient documentation

## 2012-09-17 DIAGNOSIS — O212 Late vomiting of pregnancy: Secondary | ICD-10-CM | POA: Insufficient documentation

## 2012-09-17 DIAGNOSIS — R109 Unspecified abdominal pain: Secondary | ICD-10-CM | POA: Insufficient documentation

## 2012-09-17 LAB — COMPREHENSIVE METABOLIC PANEL
Alkaline Phosphatase: 105 U/L (ref 39–117)
BUN: 3 mg/dL — ABNORMAL LOW (ref 6–23)
Creatinine, Ser: 0.41 mg/dL — ABNORMAL LOW (ref 0.50–1.10)
GFR calc Af Amer: >90 mL/min (ref >90)
Glucose, Bld: 85 mg/dL (ref 70–99)
Potassium: 3.6 mEq/L (ref 3.5–5.1)
Total Bilirubin: 0.1 mg/dL — ABNORMAL LOW (ref 0.3–1.2)
Total Protein: 5.9 g/dL — ABNORMAL LOW (ref 6.0–8.3)

## 2012-09-17 LAB — CBC WITH DIFFERENTIAL/PLATELET
Eosinophils Absolute: 0.1 10*3/uL (ref 0.0–0.7)
HCT: 36.4 % (ref 36.0–46.0)
Hemoglobin: 9.5 g/dL — ABNORMAL LOW (ref 12.0–15.0)
Lymphs Abs: 1.7 10*3/uL (ref 0.7–4.0)
MCH: 25.9 pg — ABNORMAL LOW (ref 26.0–34.0)
MCHC: 26.1 g/dL — ABNORMAL LOW (ref 30.0–36.0)
MCV: 99.2 fL (ref 78.0–100.0)
Monocytes Absolute: 0.5 10*3/uL (ref 0.1–1.0)
Monocytes Relative: 8 % (ref 3–12)
Neutrophils Relative %: 66 % (ref 43–77)
RBC: 3.67 MIL/uL — ABNORMAL LOW (ref 3.87–5.11)

## 2012-09-17 LAB — URINALYSIS, ROUTINE W REFLEX MICROSCOPIC
Bilirubin Urine: NEGATIVE
Glucose, UA: NEGATIVE mg/dL
Hgb urine dipstick: NEGATIVE
Ketones, ur: NEGATIVE mg/dL
Nitrite: NEGATIVE
Specific Gravity, Urine: 1.02 (ref 1.005–1.030)
pH: 7 (ref 5.0–8.0)

## 2012-09-17 MED ORDER — RANITIDINE HCL 150 MG PO TABS: 150.0000 mg | ORAL_TABLET | Freq: Two times a day (BID) | ORAL | Status: DC

## 2012-09-17 MED ORDER — LACTATED RINGERS IV SOLN
Freq: Once | INTRAVENOUS | Status: AC
  Administered 2012-09-17: 13:00:00 via INTRAVENOUS

## 2012-09-17 MED ORDER — PROMETHAZINE HCL 25 MG/ML IJ SOLN
12.5000 mg | INTRAMUSCULAR | Status: DC | PRN
  Administered 2012-09-17: 12.5 mg via INTRAVENOUS
  Filled 2012-09-17: qty 1

## 2012-09-17 MED ORDER — PROMETHAZINE HCL 25 MG PO TABS: 25.0000 mg | ORAL_TABLET | Freq: Four times a day (QID) | ORAL | Status: DC | PRN

## 2012-09-17 NOTE — MAU Note (Signed)
Patient state she has been having nauusea and vomiting for 24 hours. Not able to keep anything down since 1800 last night. Started having abdominal cramping yesterday with some back pain. Denies bleeding or leaking and reports good fetal movement.

## 2012-09-17 NOTE — MAU Note (Signed)
Patient is in with n/v lower back and abdominal pain since last night. She denies diarrhea, vaginal bleeding or lof. She reports good fetal movement.

## 2012-10-13 ENCOUNTER — Encounter (HOSPITAL_COMMUNITY): Payer: Self-pay

## 2012-10-13 ENCOUNTER — Inpatient Hospital Stay (HOSPITAL_COMMUNITY)
Admission: RE | Admit: 2012-10-13 | Discharge: 2012-10-16 | DRG: 775 | Disposition: A | Discharge status: Home / Self Care | Payer: Medicaid Other | Source: Ambulatory Visit | Attending: Obstetrics and Gynecology | Admitting: Obstetrics and Gynecology

## 2012-10-13 DIAGNOSIS — O9902 Anemia complicating childbirth: Secondary | ICD-10-CM | POA: Diagnosis present

## 2012-10-13 DIAGNOSIS — D649 Anemia, unspecified: Secondary | ICD-10-CM | POA: Diagnosis present

## 2012-10-13 DIAGNOSIS — IMO0002 Reserved for concepts with insufficient information to code with codable children: Secondary | ICD-10-CM | POA: Diagnosis present

## 2012-10-13 DIAGNOSIS — O365990 Maternal care for other known or suspected poor fetal growth, unspecified trimester, not applicable or unspecified: Principal | ICD-10-CM | POA: Diagnosis present

## 2012-10-13 LAB — COMPREHENSIVE METABOLIC PANEL
ALT: 15 U/L (ref 0–35)
CO2: 22 mEq/L (ref 19–32)
Calcium: 9.5 mg/dL (ref 8.4–10.5)
Creatinine, Ser: 0.45 mg/dL — ABNORMAL LOW (ref 0.50–1.10)
GFR calc Af Amer: >90 mL/min (ref >90)
GFR calc non Af Amer: >90 mL/min (ref >90)
Glucose, Bld: 91 mg/dL (ref 70–99)
Sodium: 137 mEq/L (ref 135–145)
Total Bilirubin: 0.2 mg/dL — ABNORMAL LOW (ref 0.3–1.2)

## 2012-10-13 LAB — CBC
MCH: 26.5 pg (ref 26.0–34.0)
MCHC: 34.1 g/dL (ref 30.0–36.0)
Platelets: 213 10*3/uL (ref 150–400)

## 2012-10-13 LAB — PROTEIN / CREATININE RATIO, URINE: Creatinine, Urine: 57.5 mg/dL

## 2012-10-13 LAB — RPR: RPR Ser Ql: NONREACTIVE

## 2012-10-13 LAB — OB RESULTS CONSOLE GBS: GBS: NEGATIVE

## 2012-10-13 MED ORDER — FENTANYL CITRATE 0.05 MG/ML IJ SOLN
100.0000 ug | INTRAMUSCULAR | Status: DC | PRN
  Administered 2012-10-13 – 2012-10-14 (×4): 100 ug via INTRAVENOUS
  Filled 2012-10-13 (×6): qty 2

## 2012-10-13 MED ORDER — CITRIC ACID-SODIUM CITRATE 334-500 MG/5ML PO SOLN: 30.0000 mL | ORAL | Status: DC | PRN

## 2012-10-13 MED ORDER — LACTATED RINGERS IV SOLN
INTRAVENOUS | Status: DC
  Administered 2012-10-13: 900 mL via INTRAVENOUS
  Administered 2012-10-14 (×3): via INTRAVENOUS

## 2012-10-13 MED ORDER — LACTATED RINGERS IV SOLN
500.0000 mL | INTRAVENOUS | Status: DC | PRN
  Administered 2012-10-14: 500 mL via INTRAVENOUS

## 2012-10-13 MED ORDER — OXYCODONE-ACETAMINOPHEN 5-325 MG PO TABS: 1.0000 | ORAL_TABLET | ORAL | Status: DC | PRN

## 2012-10-13 MED ORDER — TERBUTALINE SULFATE 1 MG/ML IJ SOLN: 0.2500 mg | Freq: Once | INTRAMUSCULAR | Status: AC | PRN

## 2012-10-13 MED ORDER — LABETALOL HCL 5 MG/ML IV SOLN
10.0000 mg | INTRAVENOUS | Status: DC | PRN
  Filled 2012-10-13: qty 8

## 2012-10-13 MED ORDER — ACETAMINOPHEN 325 MG PO TABS: 650.0000 mg | ORAL_TABLET | ORAL | Status: DC | PRN

## 2012-10-13 MED ORDER — OXYTOCIN 40 UNITS IN LACTATED RINGERS INFUSION - SIMPLE MED: 62.5000 mL/h | INTRAVENOUS | Status: DC

## 2012-10-13 MED ORDER — DIPHENHYDRAMINE HCL 50 MG/ML IJ SOLN: 12.5000 mg | INTRAMUSCULAR | Status: DC | PRN

## 2012-10-13 MED ORDER — FENTANYL 2.5 MCG/ML BUPIVACAINE 1/10 % EPIDURAL INFUSION (WH - ANES)
14.0000 mL/h | INTRAMUSCULAR | Status: DC | PRN
  Administered 2012-10-14: 14 mL/h via EPIDURAL
  Filled 2012-10-13 (×2): qty 125

## 2012-10-13 MED ORDER — EPHEDRINE 5 MG/ML INJ
10.0000 mg | INTRAVENOUS | Status: DC | PRN
  Filled 2012-10-13: qty 4

## 2012-10-13 MED ORDER — IBUPROFEN 600 MG PO TABS
600.0000 mg | ORAL_TABLET | Freq: Four times a day (QID) | ORAL | Status: DC | PRN
  Administered 2012-10-14: 600 mg via ORAL
  Filled 2012-10-13: qty 1

## 2012-10-13 MED ORDER — FLEET ENEMA 7-19 GM/118ML RE ENEM: 1.0000 | ENEMA | RECTAL | Status: DC | PRN

## 2012-10-13 MED ORDER — EPHEDRINE 5 MG/ML INJ: 10.0000 mg | INTRAVENOUS | Status: DC | PRN

## 2012-10-13 MED ORDER — OXYTOCIN BOLUS FROM INFUSION: 500.0000 mL | INTRAVENOUS | Status: DC

## 2012-10-13 MED ORDER — LACTATED RINGERS IV SOLN
500.0000 mL | Freq: Once | INTRAVENOUS | Status: AC
  Administered 2012-10-14: 500 mL via INTRAVENOUS

## 2012-10-13 MED ORDER — MISOPROSTOL 25 MCG QUARTER TABLET
25.0000 ug | ORAL_TABLET | Freq: Once | ORAL | Status: AC
  Administered 2012-10-13: 25 ug via VAGINAL
  Filled 2012-10-13: qty 0.25

## 2012-10-13 MED ORDER — OXYTOCIN 40 UNITS IN LACTATED RINGERS INFUSION - SIMPLE MED
1.0000 m[IU]/min | INTRAVENOUS | Status: DC
  Administered 2012-10-13: 2 m[IU]/min via INTRAVENOUS
  Administered 2012-10-13: 1 m[IU]/min via INTRAVENOUS
  Administered 2012-10-13: 4 m[IU]/min via INTRAVENOUS
  Administered 2012-10-13: 8 m[IU]/min via INTRAVENOUS
  Administered 2012-10-14: 4 m[IU]/min via INTRAVENOUS
  Administered 2012-10-14: 5 m[IU]/min via INTRAVENOUS
  Administered 2012-10-14: 666 m[IU]/min via INTRAVENOUS
  Filled 2012-10-13 (×2): qty 1000

## 2012-10-13 MED ORDER — LIDOCAINE HCL (PF) 1 % IJ SOLN
30.0000 mL | INTRAMUSCULAR | Status: DC | PRN
  Filled 2012-10-13: qty 30

## 2012-10-13 MED ORDER — PHENYLEPHRINE 40 MCG/ML (10ML) SYRINGE FOR IV PUSH (FOR BLOOD PRESSURE SUPPORT)
80.0000 ug | PREFILLED_SYRINGE | INTRAVENOUS | Status: DC | PRN
  Filled 2012-10-13: qty 5

## 2012-10-13 MED ORDER — LABETALOL HCL 5 MG/ML IV SOLN: 10.0000 mg | Freq: Once | INTRAVENOUS | Status: DC

## 2012-10-13 MED ORDER — ONDANSETRON HCL 4 MG/2ML IJ SOLN
4.0000 mg | Freq: Four times a day (QID) | INTRAMUSCULAR | Status: DC | PRN
  Administered 2012-10-14: 4 mg via INTRAVENOUS
  Filled 2012-10-13: qty 2

## 2012-10-13 MED ORDER — PHENYLEPHRINE 40 MCG/ML (10ML) SYRINGE FOR IV PUSH (FOR BLOOD PRESSURE SUPPORT): 80.0000 ug | PREFILLED_SYRINGE | INTRAVENOUS | Status: DC | PRN

## 2012-10-13 NOTE — Progress Notes (Signed)
Patient ID: Tara Bradshaw, female   DOB: 26-Mar-1986, 27 y.o.   MRN: [id number] Tara Bradshaw is a 27 y.o. G1P0 at [redacted]w[redacted]d admitted for IOL   Subjective: C/o feeling more ctx now in her back, currently resting in L ex sims position, rcv'd 1 dose cytotec at 8am.   Objective: BP 147/86  Pulse 73  Temp(Src) 98.4 F (36.9 C) (Oral)  Resp 18  Ht 5\' 2"  (1.575 m)  Wt 243 lb (110.224 kg)  BMI 44.43 kg/m2  LMP 12/12/2011     FHT:  FHR: 130 bpm, variability: moderate,  accelerations:  Present,  decelerations:  Absent had a run of late decels about an hour ago, that have resolved  UC:   Irregular, not tracing well  SVE:   Dilation: 2 Effacement (%): Thick Station: -2 Exam by:: Deletha Jaffee  Foley balloon placed without difficulty, inflated 60cc and taped with mild traction to pt's inner thigh   Assessment / Plan: IOL  Labor: cervical ripening, will start pitocin now w foley balloon  Preeclampsia:  no s/s, labs normal, BP stable, prot/creat ratio .12  Fetal Wellbeing:  Category I Pain Control:  desires IV meds Anticipated MOD:  NSVD  Recheck after foley comes out   Update physician PRN   Malissa Hippo 10/13/2012, 2:07 PM

## 2012-10-13 NOTE — Progress Notes (Signed)
Patient ID: Tara Bradshaw, female   DOB: 1985/06/13, 27 y.o.   MRN: [id number] Tara Bradshaw is a 27 y.o. G1P0 at 110w0d admitted for IOL   Subjective: Requesting IV pain meds, good relief w fentanyl earlier, declines epidural at present   Objective: BP 147/78  Pulse 74  Temp(Src) 98.6 F (37 C) (Oral)  Resp 18  Ht 5\' 2"  (1.575 m)  Wt 243 lb (110.224 kg)  BMI 44.43 kg/m2  LMP 12/12/2011     FHT:  FHR: 130 bpm, variability: minimal ,  accelerations:  Abscent,  decelerations:  Absent, occ periods of mod variability, occ 10x10 accel  UC:   regular, every 3-4 minutes, not tracing well at times  SVE:   Dilation: 4 Effacement (%): 90 Station: -2 Exam by:: Jola Critzer, CNM  Foley balloon removed   Assessment / Plan: IOL for growth restriction, foley balloon now out, will continue to titrate pitocin   Labor: Progressing on Pitocin, will continue to increase then AROM Preeclampsia:  no s/s, BP stable Fetal Wellbeing:  Category II Pain Control:  Fentanyl Anticipated MOD:  NSVD  Recheck 2-3hrs, AROM/IUPC   Update physician PRN   Malissa Hippo 10/13/2012, 7:57 PM

## 2012-10-13 NOTE — H&P (Signed)
Tara Bradshaw is a 27 y.o. female presenting for IOL secondary to persistent AC lag. She denies any ctx, vag bleeding or LOF, reports GFM. Denies HA/N/V/RUQ pain  HPI: Pt began PNC at [facility] at 14wks, EDC based on 8wk [place] =7/14 1st trimester screen WNL, declined AFP,  Anatomy scan at 18wks WNL [place] at 36wks for S<D, noted growth at 30%, decreased AFI and AC lag, BPP 8/8 F/u US at 38wks growth 16%, w worsening AC lag at 3%, normal AFI, BPP 8/8   Maternal Medical History:  Reason for admission: Nausea. IOL at 39wks for [name]  Fetal activity: Perceived fetal activity is normal.   Last perceived fetal movement was within the past hour.    Prenatal complications: AC lag  Prenatal Complications - Diabetes: none.    OB History   Grav Para Term Preterm Abortions TAB SAB Ect Mult Living   1              Past Medical History  Diagnosis Date  . Pneumonia     x 1  . Infection     BV X 1  . Anemia     CHRONIC   Past Surgical History  Procedure Laterality Date  . Wisdom tooth extraction     Family History: family history includes Asthma in her mother; Cancer in her paternal grandmother; Cancer (age of onset: 74) in her maternal aunt; Epilepsy in her mother; and Hypertension in her maternal grandmother. Social History:  reports that she has never smoked. She has never used smokeless tobacco. She reports that she does not drink alcohol or use illicit drugs.   Prenatal Transfer Tool  Maternal Diabetes: No Genetic Screening: Normal Maternal Ultrasounds/Referrals: Normal Fetal Ultrasounds or other Referrals:  Referred to Materal Fetal Medicine  persistent AC lag, overall growth is normal  Maternal Substance Abuse:  No Significant Maternal Medications:  None Significant Maternal Lab Results:  Lab values include: Group B Strep negative Other Comments:  None  Review of Systems  Eyes: Negative for blurred vision.  Respiratory: Negative for shortness of breath.   Cardiovascular:  Negative for chest pain.  Gastrointestinal: Negative for heartburn, nausea, vomiting and abdominal pain.  Neurological: Negative for headaches.  All other systems reviewed and are negative.    Dilation: 2 Effacement (%): Thick Station: -2 Exam by:: dherr rn Blood pressure 144/78, pulse 79, temperature 98.2 F (36.8 C), temperature source Oral, resp. rate 20, height 5\' 2"  (1.575 Bradshaw), weight 243 lb (110.224 kg), last menstrual period 12/12/2011. Maternal Exam:  Abdomen: Patient reports no abdominal tenderness. Fundal height is aga.   Estimated fetal weight is 6-6.   Fetal presentation: vertex  Introitus: Normal vulva. Normal vagina.  Pelvis: adequate for delivery.   Cervix: Cervix evaluated by digital exam.     Fetal Exam Fetal Monitor Review: Mode: ultrasound.   Baseline rate: 130.  Variability: moderate (6-25 bpm).   Pattern: accelerations present and no decelerations.    Fetal State Assessment: Category I - tracings are normal.     Physical Exam  Nursing note and vitals reviewed. Constitutional: She is oriented to person, place, and time. She appears well-developed and well-nourished.  HENT:  Head: Normocephalic.  Eyes: Pupils are equal, round, and reactive to light.  Neck: Normal range of motion.  Cardiovascular: Normal rate, regular rhythm and normal heart sounds.   Respiratory: Effort normal and breath sounds normal.  GI: Soft. Bowel sounds are normal.  Genitourinary: Vagina normal.  Musculoskeletal: Normal range of  motion.  Neurological: She is alert and oriented to person, place, and time. She has normal reflexes.  Skin: Skin is warm and dry.  Psychiatric: She has a normal mood and affect. Her behavior is normal.    Prenatal labs: ABO, Rh: --/--/O POS (07/07 1610) Antibody: NEG (07/07 9604) Rubella: 1.18 (12/18 1149) RPR: NON REAC (12/18 1149)  HBsAg: NEGATIVE (12/18 1149)  HIV: NON REACTIVE (12/18 1149)  GBS:   neg - 6-21 GC/CT neg Pap WNL Sickle cell  - normal  1hr gtt 94, hgb 9.3, RPR NR H/H - 9.4/28.3 on 6-5   Assessment/Plan: IUP at 39wks AC lag at 3% overall growth 16% EFW 6#6oz Chronic anemia GBS neg FHR reassuring Mildly elevated BP Unfavorable cervix  Admit to b.s. Per c/w DR Rivard Routine L&D orders cytotec PV q4h, may use foley balloon and pitocin when appropriate Will check PIH labs and protein/creatine ratio     Tara Bradshaw 10/13/2012, 10:23 AM

## 2012-10-13 NOTE — Progress Notes (Signed)
Patient ID: Tara Bradshaw, female   DOB: Dec 31, 1985, 27 y.o.   MRN: [id number] Tara Bradshaw is a 27 y.o. G1P0 at [redacted]w[redacted]d admitted for IOL due to [facility] lag  Subjective: No c/o, denies HA/N/V/RUQ pain,   Objective: BP 144/78  Pulse 79  Temp(Src) 98.2 F (36.8 C) (Oral)  Resp 20  Ht 5\' 2"  (1.575 m)  Wt 243 lb (110.224 kg)  BMI 44.43 kg/m2  LMP 12/12/2011    Filed Vitals:   10/13/12 0650 10/13/12 0654 10/13/12 0801  BP: 152/92 137/90 144/78  Pulse: 83 81 79  Temp: 98.2 F (36.8 C)    TempSrc: Oral    Resp: 20    Height: 5\' 2"  (1.575 m)    Weight: 243 lb (110.224 kg)       FHT:  FHR: 130 bpm, variability: moderate,  accelerations:  Present,  decelerations:  Absent UC:   none SVE:   Dilation: 2 Effacement (%): Thick Station: -2 Exam by:: dherr rn    Assessment / Plan: IOL at 39wks due to [facility] lag  Labor: s/p cytotec at 0800 for cervical ripening  Preeclampsia:  no s/s, will check labs and prot/creat ratio, mildly elevated BP's  Fetal Wellbeing:  Category I Pain Control:  n/a Anticipated MOD:  NSVD GBS neg Recheck at noon, consider repeat cytotec/foley balloon or pitocin when appropriate  Update physician PRN   Teagen Mcleary M 10/13/2012, 9:50 AM

## 2012-10-14 ENCOUNTER — Encounter (HOSPITAL_COMMUNITY): Payer: Self-pay | Admitting: Anesthesiology

## 2012-10-14 ENCOUNTER — Encounter (HOSPITAL_COMMUNITY): Payer: Self-pay

## 2012-10-14 ENCOUNTER — Inpatient Hospital Stay (HOSPITAL_COMMUNITY): Payer: Medicaid Other | Admitting: Anesthesiology

## 2012-10-14 DIAGNOSIS — IMO0002 Reserved for concepts with insufficient information to code with codable children: Secondary | ICD-10-CM | POA: Diagnosis present

## 2012-10-14 LAB — COMPREHENSIVE METABOLIC PANEL
ALT: 13 U/L (ref 0–35)
AST: 21 U/L (ref 0–37)
Albumin: 2.6 g/dL — ABNORMAL LOW (ref 3.5–5.2)
Alkaline Phosphatase: 129 U/L — ABNORMAL HIGH (ref 39–117)
BUN: <3 mg/dL — ABNORMAL LOW (ref 6–23)
Chloride: 102 mEq/L (ref 96–112)
Potassium: 3.7 mEq/L (ref 3.5–5.1)
Sodium: 136 mEq/L (ref 135–145)
Total Bilirubin: 0.5 mg/dL (ref 0.3–1.2)

## 2012-10-14 LAB — URIC ACID: Uric Acid, Serum: 3.7 mg/dL (ref 2.4–7.0)

## 2012-10-14 LAB — CBC
HCT: 31.6 % — ABNORMAL LOW (ref 36.0–46.0)
Hemoglobin: 10 g/dL — ABNORMAL LOW (ref 12.0–15.0)
MCHC: 33.7 g/dL (ref 30.0–36.0)
Platelets: 208 10*3/uL (ref 150–400)
RDW: 15.3 % (ref 11.5–15.5)
RDW: 15.3 % (ref 11.5–15.5)
WBC: 19.7 10*3/uL — ABNORMAL HIGH (ref 4.0–10.5)

## 2012-10-14 LAB — PROTEIN / CREATININE RATIO, URINE: Protein Creatinine Ratio: 0.29 — ABNORMAL HIGH (ref 0.00–0.15)

## 2012-10-14 MED ORDER — OXYCODONE-ACETAMINOPHEN 5-325 MG PO TABS: 1.0000 | ORAL_TABLET | ORAL | Status: DC | PRN

## 2012-10-14 MED ORDER — BENZOCAINE-MENTHOL 20-0.5 % EX AERO: 1.0000 "application " | INHALATION_SPRAY | CUTANEOUS | Status: DC | PRN

## 2012-10-14 MED ORDER — LANOLIN HYDROUS EX OINT: TOPICAL_OINTMENT | CUTANEOUS | Status: DC | PRN

## 2012-10-14 MED ORDER — SIMETHICONE 80 MG PO CHEW: 80.0000 mg | CHEWABLE_TABLET | ORAL | Status: DC | PRN

## 2012-10-14 MED ORDER — FENTANYL 2.5 MCG/ML BUPIVACAINE 1/10 % EPIDURAL INFUSION (WH - ANES)
INTRAMUSCULAR | Status: DC | PRN
  Administered 2012-10-14: 14 mL/h via EPIDURAL

## 2012-10-14 MED ORDER — SENNOSIDES-DOCUSATE SODIUM 8.6-50 MG PO TABS
2.0000 | ORAL_TABLET | Freq: Every day | ORAL | Status: DC
  Administered 2012-10-14: 2 via ORAL

## 2012-10-14 MED ORDER — ONDANSETRON HCL 4 MG PO TABS: 4.0000 mg | ORAL_TABLET | ORAL | Status: DC | PRN

## 2012-10-14 MED ORDER — WITCH HAZEL-GLYCERIN EX PADS: 1.0000 "application " | MEDICATED_PAD | CUTANEOUS | Status: DC | PRN

## 2012-10-14 MED ORDER — FENTANYL CITRATE 0.05 MG/ML IJ SOLN
INTRAMUSCULAR | Status: DC | PRN
  Administered 2012-10-14 (×2): 50 ug via EPIDURAL

## 2012-10-14 MED ORDER — PRENATAL MULTIVITAMIN CH
1.0000 | ORAL_TABLET | Freq: Every day | ORAL | Status: DC
  Administered 2012-10-15 – 2012-10-16 (×2): 1 via ORAL
  Filled 2012-10-14 (×2): qty 1

## 2012-10-14 MED ORDER — DIBUCAINE 1 % RE OINT: 1.0000 "application " | TOPICAL_OINTMENT | RECTAL | Status: DC | PRN

## 2012-10-14 MED ORDER — TETANUS-DIPHTH-ACELL PERTUSSIS 5-2.5-18.5 LF-MCG/0.5 IM SUSP: 0.5000 mL | Freq: Once | INTRAMUSCULAR | Status: DC

## 2012-10-14 MED ORDER — LIDOCAINE HCL (PF) 1 % IJ SOLN
INTRAMUSCULAR | Status: DC | PRN
  Administered 2012-10-14 (×2): 4 mL

## 2012-10-14 MED ORDER — ONDANSETRON HCL 4 MG/2ML IJ SOLN: 4.0000 mg | INTRAMUSCULAR | Status: DC | PRN

## 2012-10-14 MED ORDER — BUPIVACAINE HCL (PF) 0.25 % IJ SOLN
INTRAMUSCULAR | Status: DC | PRN
  Administered 2012-10-14 (×2): 4 mL

## 2012-10-14 MED ORDER — IBUPROFEN 600 MG PO TABS
600.0000 mg | ORAL_TABLET | Freq: Four times a day (QID) | ORAL | Status: DC
  Administered 2012-10-14 – 2012-10-16 (×4): 600 mg via ORAL
  Filled 2012-10-14 (×5): qty 1

## 2012-10-14 MED ORDER — ZOLPIDEM TARTRATE 5 MG PO TABS: 5.0000 mg | ORAL_TABLET | Freq: Every evening | ORAL | Status: DC | PRN

## 2012-10-14 MED ORDER — DIPHENHYDRAMINE HCL 25 MG PO CAPS: 25.0000 mg | ORAL_CAPSULE | Freq: Four times a day (QID) | ORAL | Status: DC | PRN

## 2012-10-14 NOTE — Progress Notes (Signed)
  Patient prepared for transfer from [facility]. Doing well, baby with her.  Filed Vitals:   10/14/12 1731 10/14/12 1804 10/14/12 1841 10/14/12 1926  BP: 130/86 143/66 146/79 141/91  Pulse: 86 98 108 93  Temp:   98.4 F (36.9 C)   TempSrc:   Oral   Resp: 18  18 18   Height:      Weight:      SpO2:       Haroldine Laws, CNM, reviewed protein creatinine ratio with Dr. Stefano Gaul. Will transfer patient to [facility]. Repeat protein/creatinine ratio and PIH labs in am.  Nigel Bridgeman, CNM 10/14/12 8:10p

## 2012-10-14 NOTE — Progress Notes (Signed)
  Subjective: Pt asleep, comfortable with epidural.  Reports her legs feel heavy.  Plan of care discussed, pt verbalizes agreement.  FSE stopped working at some point this morning and external monitoring was put on.  Objective: BP 141/76  Pulse 73  Temp(Src) 98.6 F (37 C) (Oral)  Resp 18  Ht 5\' 2"  (1.575 m)  Wt 243 lb (110.224 kg)  BMI 44.43 kg/m2  SpO2 100%  LMP 12/12/2011      FHT:  FHR: 130 bpm, variability: minimal ,  accelerations:  Abscent,  decelerations:  Present earlies;  prolonged decel at 0727 down to 65 for 3 min resolving back to baseline UC:   regular, every 3-5 minutes  Pit: 3 milliU MVUs: 110  SVE:   Dilation: 5.5 Effacement (%): 90 Station: -1;0 Exam by:: Haroldine Laws, cnm  Assessment / Plan:  Results for orders placed during the hospital encounter of 10/13/12 (from the past 24 hour(s))  PROTEIN / CREATININE RATIO, URINE     Status: None   Collection Time    10/13/12 10:00 AM      Result Value Range   Creatinine, Urine 57.50     Total Protein, Urine 6.7     PROTEIN CREATININE RATIO 0.12  0.00 - 0.15  CBC     Status: Abnormal   Collection Time    10/14/12  2:25 AM      Result Value Range   WBC 12.6 (*) 4.0 - 10.5 K/uL   RBC 3.84 (*) 3.87 - 5.11 MIL/uL   Hemoglobin 10.0 (*) 12.0 - 15.0 g/dL   HCT 54.0 (*) 98.1 - 19.1 %   MCV 77.3 (*) 78.0 - 100.0 fL   MCH 26.0  26.0 - 34.0 pg   MCHC 33.7  30.0 - 36.0 g/dL   RDW 47.8  29.5 - 62.1 %   Platelets 208  150 - 400 K/uL    FSE replaced C/w Dr. Stefano Gaul regarding FHT and elevated BPs  Labor: IOL; Pitocin Preeclampsia: Elevated BP at 0732 at 0501 this am; see labs above Fetal Wellbeing: Cat II,  Pain Control: Epidural I/D: GBS neg; SROM x 6 hours Anticipated MOD: SVD   Tara Bradshaw 10/14/2012, 7:43 AM

## 2012-10-14 NOTE — Progress Notes (Signed)
Patient ID: Tara Bradshaw, female   DOB: 1985/12/13, 27 y.o.   MRN: [id number] FAIGY Bradshaw is a 27 y.o. G1P0 at [redacted]w[redacted]d admitted for IOL for IUGR  Subjective: Remains comfortable w epidural,   Objective: BP 137/75  Pulse 76  Temp(Src) 98.6 F (37 C) (Oral)  Resp 18  Ht 5\' 2"  (1.575 m)  Wt 243 lb (110.224 kg)  BMI 44.43 kg/m2  SpO2 100%  LMP 12/12/2011     FHT:  FHR: 120 bpm, variability: minimal ,  accelerations:  Present,  decelerations:  Present occ mild early variable UC:   regular, every 4-6  minutes SVE:   Dilation: 5 Effacement (%): 90 Station: -1;0 Exam by:: S Cyara Devoto CNM  Vag exam deferred   Assessment / Plan: Induction of labor due to IUGR,  progressing well on pitocin, inadequate MVU's  Labor: titrate pitocin for adequate MVU's,  Preeclampsia:  no s/s Fetal Wellbeing:  Category II, decreased variability, occ 10x10 accel,  Pain Control:  Epidural Anticipated MOD:  NSVD  Recheck when adequate MVU's   Will update Dr Estanislado Pandy at shift change    Shavonta Gossen M 10/14/2012, 6:23 AM

## 2012-10-14 NOTE — Anesthesia Preprocedure Evaluation (Signed)
Anesthesia Evaluation  Patient identified by MRN, date of birth, ID band Patient awake    Reviewed: Allergy & Precautions, H&P , Patient's Chart, lab work & pertinent test results  Airway Mallampati: III TM Distance: >3 FB Neck ROM: Full    Dental no notable dental hx. (+) Teeth Intact   Pulmonary pneumonia -, resolved,  breath sounds clear to auscultation  Pulmonary exam normal       Cardiovascular negative cardio ROS  Rhythm:Regular Rate:Normal     Neuro/Psych negative neurological ROS     GI/Hepatic negative GI ROS, Neg liver ROS,   Endo/Other  Morbid obesity  Renal/GU negative Renal ROS  negative genitourinary   Musculoskeletal negative musculoskeletal ROS (+)   Abdominal (+) + obese,   Peds  Hematology  (+) Blood dyscrasia, anemia ,   Anesthesia Other Findings   Reproductive/Obstetrics (+) Pregnancy                           Anesthesia Physical Anesthesia Plan  ASA: III  Anesthesia Plan: Epidural   Post-op Pain Management:    Induction:   Airway Management Planned: Natural Airway  Additional Equipment:   Intra-op Plan:   Post-operative Plan:   Informed Consent: I have reviewed the patients History and Physical, chart, labs and discussed the procedure including the risks, benefits and alternatives for the proposed anesthesia with the patient or authorized representative who has indicated his/her understanding and acceptance.   Dental advisory given  Plan Discussed with: Anesthesiologist  Anesthesia Plan Comments:         Anesthesia Quick Evaluation

## 2012-10-14 NOTE — Anesthesia Procedure Notes (Signed)
Epidural Patient location during procedure: OB Start time: 10/14/2012 3:25 AM  Staffing Anesthesiologist: Jamar Casagrande A. Performed by: anesthesiologist   Preanesthetic Checklist Completed: patient identified, site marked, surgical consent, pre-op evaluation, timeout performed, IV checked, risks and benefits discussed and monitors and equipment checked  Epidural Patient position: sitting Prep: site prepped and draped and DuraPrep Patient monitoring: continuous pulse ox and blood pressure Approach: midline Injection technique: LOR air  Needle:  Needle type: Tuohy  Needle gauge: 17 G Needle length: 9 cm and 9 Needle insertion depth: 8 cm Catheter type: closed end flexible Catheter size: 19 Gauge Catheter at skin depth: 14 cm Test dose: negative and Other  Assessment Events: blood not aspirated, injection not painful, no injection resistance, negative IV test and no paresthesia  Additional Notes Patient identified. Risks and benefits discussed including failed block, incomplete  Pain control, post dural puncture headache, nerve damage, paralysis, blood pressure Changes, nausea, vomiting, reactions to medications-both toxic and allergic and post Partum back pain. All questions were answered. Patient expressed understanding and wished to proceed. Sterile technique was used throughout procedure. Epidural site was Dressed with sterile barrier dressing. No paresthesias, signs of intravascular injection Or signs of intrathecal spread were encountered.  Patient was more comfortable after the epidural was dosed. Please see RN's note for documentation of vital signs and FHR which are stable.

## 2012-10-14 NOTE — Progress Notes (Signed)
Patient ID: Tara Bradshaw, female   DOB: 1985-07-17, 27 y.o.   MRN: [id number] Tara Bradshaw is a 27 y.o. G1P0 at [redacted]w[redacted]d admitted for IOL for IUGR  Subjective: Just now got epidural, getting comfortable   Objective: BP 133/84  Pulse 69  Temp(Src) 98.3 F (36.8 C) (Oral)  Resp 18  Ht 5\' 2"  (1.575 m)  Wt 243 lb (110.224 kg)  BMI 44.43 kg/m2  SpO2 100%  LMP 12/12/2011     FHT:  FHR: 130 bpm, variability: minimal ,  accelerations:  Abscent,  decelerations:  Present prolonged UC:   regular, every 5-6  minutes SVE:   Dilation: 5 Effacement (%): 90 Station: -1;0 Exam by:: S Demosthenes Virnig CNM  AROM forebag for sm amt clear fluid, IUPC placed without difficulty approx 5 min later FHR decel to 70's with slow return to baseline over 10 minutes, FSE placed, cervix stretchy 5-6cm/100,  position changes, IVF's, pitocin stopped  Assessment / Plan: Induction of labor due to IUGR,  progressing well on pitocin FHR prolonged decel w recovery, will restart pitocin at 4mu, (was on 9mu when stopped ago)  Labor: Progressing normally Preeclampsia:  no s/s Fetal Wellbeing:  Category II Pain Control:  Epidural Anticipated MOD:  NSVD  Recheck after 2hrs adequate MVU's   Update physician PRN   Malissa Hippo 10/14/2012, 4:38 AM

## 2012-10-14 NOTE — Progress Notes (Signed)
Patient ID: Tara Bradshaw, female   DOB: 1985/11/10, 27 y.o.   MRN: [id number] Tara Bradshaw is a 27 y.o. G1P0 at [redacted]w[redacted]d admitted for IOL for growth restriction  Subjective: rcv'd 4th dose of fentanyl IVP w good results about an hour ago, leaking clear in the last hour   Objective: BP 148/82  Pulse 74  Temp(Src) 98.5 F (36.9 C) (Oral)  Resp 20  Ht 5\' 2"  (1.575 m)  Wt 243 lb (110.224 kg)  BMI 44.43 kg/m2  LMP 12/12/2011     FHT:  FHR: 130 bpm, variability: moderate,  accelerations:  Present,  decelerations:  Absent UC:   regular, every 2-3  minutes SVE:   Dilation: 5 Effacement (%): 90 Station: -1;0 Exam by:: S Apoorva Bugay CNM  Clear fluid noted at introitus and leaking more w vag exam   Assessment / Plan: Induction of labor due to IUGR,  progressing well on pitocin  Labor: Progressing normally early/active  Preeclampsia:  no s/s, BP stable Fetal Wellbeing:  Category I Pain Control:  Fentanyl Anticipated MOD:  NSVD  Pt declines epidural, rv'd need to avoid IV pain meds immediately prior to delivery Recheck 2-3hr, IUPC if no cervical change  Update physician PRN   Malissa Hippo 10/14/2012, 1:42 AM

## 2012-10-15 LAB — COMPREHENSIVE METABOLIC PANEL
AST: 16 U/L (ref 0–37)
Albumin: 2.2 g/dL — ABNORMAL LOW (ref 3.5–5.2)
Alkaline Phosphatase: 105 U/L (ref 39–117)
Chloride: 105 mEq/L (ref 96–112)
Potassium: 3.8 mEq/L (ref 3.5–5.1)
Sodium: 137 mEq/L (ref 135–145)
Total Bilirubin: 0.2 mg/dL — ABNORMAL LOW (ref 0.3–1.2)
Total Protein: 4.9 g/dL — ABNORMAL LOW (ref 6.0–8.3)

## 2012-10-15 LAB — CBC
HCT: 26.4 % — ABNORMAL LOW (ref 36.0–46.0)
Hemoglobin: 8.7 g/dL — ABNORMAL LOW (ref 12.0–15.0)
RBC: 3.4 MIL/uL — ABNORMAL LOW (ref 3.87–5.11)
WBC: 16.2 10*3/uL — ABNORMAL HIGH (ref 4.0–10.5)

## 2012-10-15 LAB — PROTEIN / CREATININE RATIO, URINE
Creatinine, Urine: 152.27 mg/dL
Protein Creatinine Ratio: 0.13 (ref 0.00–0.15)
Total Protein, Urine: 20.3 mg/dL

## 2012-10-15 LAB — URIC ACID: Uric Acid, Serum: 3.7 mg/dL (ref 2.4–7.0)

## 2012-10-15 MED ORDER — FERROUS SULFATE 325 (65 FE) MG PO TABS
325.0000 mg | ORAL_TABLET | Freq: Two times a day (BID) | ORAL | Status: DC
  Administered 2012-10-15 – 2012-10-16 (×2): 325 mg via ORAL
  Filled 2012-10-15 (×3): qty 1

## 2012-10-15 NOTE — Progress Notes (Signed)
Post Partum Day 1: S/P SVD with no lacerations  Subjective: Patient up ad lib, denies syncope or dizziness. Voiding without difficulty and reports flatus. Feeding:  Breast and bottle Contraceptive plan:   Mirena  Objective: Blood pressure 120/66, pulse 83, temperature 97.8 F (36.6 C), temperature source Oral, resp. rate 18, height 5\' 2"  (1.575 m), weight 243 lb (110.224 kg), last menstrual period 12/12/2011, SpO2 98.00%, unknown if currently breastfeeding.  Physical Exam:  General: alert, cooperative and no distress Lochia: appropriate Uterine Fundus: firm Incision: healing well DVT Evaluation: No evidence of DVT seen on physical exam. Negative Homan's sign.  Orthostatic Vital Signs Filed Vitals:   10/15/12 0130 10/15/12 1000 10/15/12 1005 10/15/12 1008  BP: 120/66 113/70 131/87 136/87  Pulse: 83 78 70 74  Temp: 97.8 F (36.6 C) 97.3 F (36.3 C)    TempSrc: Oral Oral    Resp: 18 18    Height:      Weight:      SpO2: 98% 100% 100% 100%     Recent Labs  10/14/12 1645 10/15/12 0610  HGB 10.5* 8.7*  HCT 31.6* 26.4*    Assessment/Plan: S/P Vaginal delivery day 1 Asymptomatic anemia   Continue current care Plan for discharge tomorrow   LOS: 2 days   Terisa Belardo 10/15/2012, 7:57 AM

## 2012-10-15 NOTE — Anesthesia Postprocedure Evaluation (Signed)
  Anesthesia Post-op Note  Patient: Tara Bradshaw  Procedure(s) Performed: * No procedures listed *  Anesthesia Post Note  Patient: Tara Bradshaw  Procedure(s) Performed: * No procedures listed *  Anesthesia type: Epidural  Patient location: Mother/Baby  Post pain: Pain level controlled  Post assessment: Post-op Vital signs reviewed  Last Vitals:  Filed Vitals:   10/15/12 1008  BP: 136/87  Pulse: 74  Temp:   Resp:     Post vital signs: Reviewed  Level of consciousness:alert  Complications: No apparent anesthesia complications

## 2012-10-15 NOTE — Progress Notes (Signed)
UR chart review completed.  

## 2012-10-16 LAB — LACTATE DEHYDROGENASE: LDH: 182 U/L (ref 94–250)

## 2012-10-16 LAB — CBC
HCT: 28.2 % — ABNORMAL LOW (ref 36.0–46.0)
Hemoglobin: 9.3 g/dL — ABNORMAL LOW (ref 12.0–15.0)
MCHC: 33 g/dL (ref 30.0–36.0)
RBC: 3.58 MIL/uL — ABNORMAL LOW (ref 3.87–5.11)

## 2012-10-16 LAB — COMPREHENSIVE METABOLIC PANEL
ALT: 13 U/L (ref 0–35)
Alkaline Phosphatase: 104 U/L (ref 39–117)
BUN: 3 mg/dL — ABNORMAL LOW (ref 6–23)
CO2: 25 mEq/L (ref 19–32)
Chloride: 103 mEq/L (ref 96–112)
GFR calc Af Amer: >90 mL/min (ref >90)
GFR calc non Af Amer: >90 mL/min (ref >90)
Glucose, Bld: 106 mg/dL — ABNORMAL HIGH (ref 70–99)
Potassium: 3.4 mEq/L — ABNORMAL LOW (ref 3.5–5.1)
Sodium: 137 mEq/L (ref 135–145)
Total Bilirubin: 0.2 mg/dL — ABNORMAL LOW (ref 0.3–1.2)
Total Protein: 5.5 g/dL — ABNORMAL LOW (ref 6.0–8.3)

## 2012-10-16 LAB — PROTEIN / CREATININE RATIO, URINE: Creatinine, Urine: 66.47 mg/dL

## 2012-10-16 MED ORDER — NIFEDIPINE ER 30 MG PO TB24
30.0000 mg | ORAL_TABLET | ORAL | Status: AC
  Administered 2012-10-16: 30 mg via ORAL
  Filled 2012-10-16: qty 1

## 2012-10-16 MED ORDER — FERROUS SULFATE 325 (65 FE) MG PO TABS: 325.0000 mg | ORAL_TABLET | Freq: Two times a day (BID) | ORAL | Status: DC

## 2012-10-16 MED ORDER — IBUPROFEN 600 MG PO TABS: 600.0000 mg | ORAL_TABLET | Freq: Four times a day (QID) | ORAL | Status: DC

## 2012-10-16 MED ORDER — NIFEDIPINE ER OSMOTIC RELEASE 30 MG PO TB24: 30.0000 mg | ORAL_TABLET | Freq: Every day | ORAL | Status: DC

## 2012-10-16 NOTE — Discharge Summary (Signed)
  Vaginal Delivery Discharge Summary  Tara Bradshaw  DOB:    1985/10/04 MRN:    [id number] CSN:    [id number]  Date of admission:                  10/13/12  Date of discharge:                   10/16/12  Procedures this admission: SVD   Date of Delivery: 10/14/12 by Haroldine Laws  Newborn Data:  Live born female  Birth Weight: 6 lb 7.2 oz (2926 g) APGAR: 9, 9  Home with mother. Name: "Chayal" Circumcision Plan: Office  History of Present Illness:  Tara Bradshaw is a 27 y.o. female, G1P1001, who presents at [redacted]w[redacted]d weeks gestation. The patient has been followed at the [facility] of [facility]. She was admitted induction of labor. Her pregnancy has been complicated by:  Patient Active Problem List   Diagnosis Date Noted  . IUGR (intrauterine growth restriction) 10/14/2012  . NSVD (normal spontaneous vaginal delivery) 10/14/2012  . Cough onset  @ around [redacted] weeks gestation 05/04/2012  . Obesity 04/21/2012    Hospital course:  The patient was admitted for IOL for persistent AC lag.   Her labor was not complicated. She proceeded to have a vaginal delivery of a healthy infant. Her delivery was not complicated. Her postpartum course was not complicated.  She was discharged to home on postpartum day 2 doing well.  Feeding:  breast and bottle  Contraception:  IUD  Discharge hemoglobin:  Hemoglobin  Date Value Range Status  10/15/2012 8.7* 12.0 - 15.0 g/dL Final     REPEATED TO VERIFY     DELTA CHECK NOTED     HCT  Date Value Range Status  10/15/2012 26.4* 36.0 - 46.0 % Final    Discharge Physical Exam:   General: alert, cooperative and no distress Lochia: appropriate Uterine Fundus: firm Incision: n/a DVT Evaluation: No evidence of DVT seen on physical exam. Negative Homan's sign.  Filed Vitals:   10/16/12 1000 10/16/12 1020 10/16/12 1345 10/16/12 1600  BP: 162/94 151/101 150/90 130/85  Pulse: 77 81  88 93  Temp:      TempSrc:      Resp:      Height:      Weight:      SpO2:        Intrapartum Procedures: spontaneous vaginal delivery Postpartum Procedures: none Complications-Operative and Postpartum: none  Discharge Diagnoses: Term Pregnancy-delivered and anemia  Discharge Information:  Activity:           pelvic rest Diet:                routine Medications: PNV, Ibuprofen and Iron Condition:      stable Instructions:  refer to practice specific booklet Discharge to: home  C/w Dr. Pennie Rushing Rx: Procardia XL 30 mg QD SmartStart RN visit on Monday   Haroldine Laws 10/16/2012

## 2012-10-16 NOTE — Discharge Planning (Signed)
Pt instructed to call midwife for BP appt on Friday afternoon (7/11) if unable to schedule with [facility] next monday

## 2012-10-31 NOTE — MAU Provider Note (Signed)
History    CSN: [id number]  Arrival date and time: 09/17/12 1208   First Provider Initiated Contact with Patient 09/17/12 1251      Chief Complaint  Patient presents with  . Abdominal Pain  . Emesis   HPI Pt presents unannouced to MAU at 35w 2d with c/o of nausea and vomiting intermittently for the past 2 days.  She states that she feels somewhat better this AM and has not vomited for the past 3hrs.  She denies fever, diarrhea.  She reports active fetus.  She denies UCs, ROM or bldg.  She denies any known exposure to any sick individuals and no known contaminated food and no recent changes in food.  She has not treated her symptoms.    OB History as of 09/17/12   Grav Para Term Preterm Abortions TAB SAB Ect Mult Living   1         1      Past Medical History  Diagnosis Date  . Pneumonia     x 1  . Infection     BV X 1  . Anemia     CHRONIC    Past Surgical History  Procedure Laterality Date  . Wisdom tooth extraction      Family History  Problem Relation Age of Onset  . Epilepsy Mother   . Cancer Maternal Aunt 52    BREAST  . Hypertension Maternal Grandmother   . Cancer Paternal Grandmother     BREAST  . Asthma Mother     History  Substance Use Topics  . Smoking status: Never Smoker   . Smokeless tobacco: Never Used  . Alcohol Use: No    Allergies: No Known Allergies  No prescriptions prior to admission    Review of Systems  Constitutional: Negative.   HENT: Negative.   Eyes: Negative.   Respiratory: Negative.   Cardiovascular: Negative.   Gastrointestinal: Positive for heartburn, nausea and vomiting. Negative for abdominal pain, diarrhea and blood in stool.  Genitourinary: Negative.   Musculoskeletal: Negative.   Skin: Negative.   Neurological: Negative.   Endo/Heme/Allergies: Negative.   Psychiatric/Behavioral: Negative.    Physical Exam   Blood pressure 112/59, pulse 69, temperature 97.1 F (36.2 C), temperature source Oral, resp. rate  16, height 5\' 2"  (1.575 m), weight 107.684 kg (237 lb 6.4 oz), last menstrual period 12/12/2011, SpO2 99.00%, unknown if currently breastfeeding.  Physical Exam  Constitutional: She is oriented to person, place, and time. She appears well-developed and well-nourished.  HENT:  Head: Normocephalic and atraumatic.  Right Ear: External ear normal.  Left Ear: External ear normal.  Nose: Nose normal.  Eyes: Conjunctivae are normal. Pupils are equal, round, and reactive to light.  Neck: Normal range of motion. Neck supple.  Cardiovascular: Normal rate, regular rhythm and intact distal pulses.   Respiratory: Effort normal and breath sounds normal.  GI: Soft. Bowel sounds are normal. She exhibits no distension. There is no tenderness. There is no rebound and no guarding.  Gravid  Genitourinary:  Uterus soft, nontender.  Pelvic exam otherwise deferred  Musculoskeletal: Normal range of motion.  Neurological: She is alert and oriented to person, place, and time. She has normal reflexes.  Skin: Skin is warm and dry.  Psychiatric: She has a normal mood and affect. Her behavior is normal.   FHR baseline 145 bpm Variability: Moderate Accels:  Present Decels: Absent FHR Cat 1 Toco: No UCs.  Occas uterine irritability only.  MAU Course  Procedures Urine  SG 1.020 and negative ketones.  Negative all other parameters. CMP all within normal limits. CBC: WBC 7.0; Hgb 9.5; Plts 216 IV hydration and IV phenergan given.  Assessment and Plan  IUP at 35w 2d Gastroenteritis  Consult with Dr. Su Hilt obtained.  Will discharge to home. Rx Phenergan given for home use as needed.   Rx Zantac given for home use with instructions provided. Revd fetal kick counts and s/s labor. RTO as previously sched for ROB at [facility].   Anishka Bushard O. 10/31/2012, 5:11 PM

## 2013-01-01 ENCOUNTER — Emergency Department (HOSPITAL_COMMUNITY)
Admission: EM | Admit: 2013-01-01 | Discharge: 2013-01-01 | Disposition: A | Discharge status: Home / Self Care | Payer: Medicaid Other | Attending: Emergency Medicine | Admitting: Emergency Medicine

## 2013-01-01 ENCOUNTER — Emergency Department (HOSPITAL_COMMUNITY): Payer: Medicaid Other

## 2013-01-01 ENCOUNTER — Encounter (HOSPITAL_COMMUNITY): Payer: Self-pay | Admitting: *Deleted

## 2013-01-01 DIAGNOSIS — D649 Anemia, unspecified: Secondary | ICD-10-CM | POA: Insufficient documentation

## 2013-01-01 DIAGNOSIS — Z8619 Personal history of other infectious and parasitic diseases: Secondary | ICD-10-CM | POA: Insufficient documentation

## 2013-01-01 DIAGNOSIS — Z8701 Personal history of pneumonia (recurrent): Secondary | ICD-10-CM | POA: Insufficient documentation

## 2013-01-01 DIAGNOSIS — Z79899 Other long term (current) drug therapy: Secondary | ICD-10-CM | POA: Insufficient documentation

## 2013-01-01 DIAGNOSIS — M549 Dorsalgia, unspecified: Secondary | ICD-10-CM

## 2013-01-01 DIAGNOSIS — M546 Pain in thoracic spine: Secondary | ICD-10-CM | POA: Insufficient documentation

## 2013-01-01 MED ORDER — HYDROCODONE-ACETAMINOPHEN 5-325 MG PO TABS: 1.0000 | ORAL_TABLET | Freq: Four times a day (QID) | ORAL | Status: DC | PRN

## 2013-01-01 MED ORDER — METHOCARBAMOL 500 MG PO TABS
500.0000 mg | ORAL_TABLET | Freq: Once | ORAL | Status: AC
  Administered 2013-01-01: 500 mg via ORAL
  Filled 2013-01-01: qty 1

## 2013-01-01 MED ORDER — OXYCODONE-ACETAMINOPHEN 5-325 MG PO TABS
2.0000 | ORAL_TABLET | Freq: Once | ORAL | Status: AC
  Administered 2013-01-01: 2 via ORAL
  Filled 2013-01-01: qty 2

## 2013-01-01 MED ORDER — METHOCARBAMOL 500 MG PO TABS: 500.0000 mg | ORAL_TABLET | Freq: Two times a day (BID) | ORAL | Status: DC

## 2013-01-01 NOTE — ED Notes (Signed)
Bed: WU98 Expected date:  Expected time:  Means of arrival:  Comments: Ems- 26 yr F, back pain

## 2013-01-01 NOTE — ED Provider Notes (Signed)
CSN: [id number]     Arrival date & time 01/01/13  1006 History   First MD Initiated Contact with Patient 01/01/13 1013     Chief Complaint  Patient presents with  . Back Pain   (Consider location/radiation/quality/duration/timing/severity/associated sxs/prior Treatment) HPI Comments: Patient presents with a chief complaint of back pain.  Pain located across the thoracic area of her back.  Pain has been present for the past three days and is gradually worsening.  She denies any radiation of the pain.  Denies numbness or tingling.  Denies bowel or bladder incontinence. Denies weakness.  Denies fever or chills.  No chest pain or SOB.  No nausea or vomiting.  She has taken Tylenol for the pain with mild relief.  She did have a epidural when she gave birth to her child, but that was on 10/14/12.  She denies any history of HIV or DM.  Denies IV drug use.  No history of Cancer.  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  Pain worse with movement.  She denies any trauma to the back.  The history is provided by the patient.    Past Medical History  Diagnosis Date  . Pneumonia     x 1  . Infection     BV X 1  . Anemia     CHRONIC   Past Surgical History  Procedure Laterality Date  . Wisdom tooth extraction     Family History  Problem Relation Age of Onset  . Epilepsy Mother   . Cancer Maternal Aunt 52    BREAST  . Hypertension Maternal Grandmother   . Cancer Paternal Grandmother     BREAST  . Asthma Mother    History  Substance Use Topics  . Smoking status: Never Smoker   . Smokeless tobacco: Never Used  . Alcohol Use: No   OB History   Grav Para Term Preterm Abortions TAB SAB Ect Mult Living   1 1 1       1      Review of Systems  Musculoskeletal: Positive for back pain.  All other systems reviewed and are negative.    Allergies  Review of patient's allergies indicates no  known allergies.  Home Medications   Current Outpatient Rx  Name  Route  Sig  Dispense  Refill  . ferrous sulfate 325 (65 FE) MG tablet   Oral   Take 1 tablet (325 mg total) by mouth 2 (two) times daily with a meal.   60 tablet   3   . ibuprofen (ADVIL,MOTRIN) 600 MG tablet   Oral   Take 1 tablet (600 mg total) by mouth every 6 (six) hours.   30 tablet   0   . Iron-FA-B Cmp-C-Biot-Probiotic (FUSION PLUS) CAPS   Oral   Take 1 capsule by mouth daily.   30 capsule   4   . NIFEdipine (PROCARDIA-XL/ADALAT-CC/NIFEDICAL-XL) 30 MG 24 hr tablet   Oral   Take 1 tablet (30 mg total) by mouth daily.   30 tablet   2    BP 144/89  Pulse 60  Temp(Src) 98.6 F (37 C) (Oral)  Resp 20  SpO2 100% Physical Exam  Nursing note and vitals reviewed. Constitutional: She appears well-developed and well-nourished.  HENT:  Head: Normocephalic and atraumatic.  Neck: Normal range of motion. Neck supple.  Cardiovascular: Normal rate, regular rhythm and normal  heart sounds.   Pulmonary/Chest: Effort normal and breath sounds normal. No respiratory distress. She has no wheezes. She has no rales.  Abdominal: Soft. Bowel sounds are normal. There is no tenderness.  Musculoskeletal: Normal range of motion.       Cervical back: She exhibits normal range of motion, no tenderness, no bony tenderness, no swelling, no edema and no deformity.       Thoracic back: She exhibits tenderness and bony tenderness. She exhibits normal range of motion, no swelling, no edema and no deformity.       Lumbar back: She exhibits normal range of motion, no tenderness, no bony tenderness, no swelling, no edema and no deformity.  Neurological: She is alert. She has normal strength. No sensory deficit. Gait normal.  Reflex Scores:      Patellar reflexes are 2+ on the right side and 2+ on the left side.      Achilles reflexes are 2+ on the right side and 2+ on the left side. Skin: Skin is warm and dry.  Psychiatric: She has  a normal mood and affect.    ED Course  Procedures (including critical care time) Labs Review Labs Reviewed - No data to display Imaging Review Dg Thoracic Spine 2 View  01/01/2013   CLINICAL DATA:  Worsening back pain, no known injury  EXAM: THORACIC SPINE - 2 VIEW  COMPARISON:  Lateral view of the chest 05/08/2007  FINDINGS: Four views of thoracic spine submitted. Minimal levoscoliosis. No acute fracture or subluxation. Alignment, disk spaces and vertebral heights are preserved.  IMPRESSION: No acute fracture or subluxation. Minimal levoscoliosis.   Electronically Signed   By: Natasha Mead   On: 01/01/2013 11:02    11:46 AM Reassessed patient.  Pain has improved at this time.  MDM  No diagnosis found. Patient with back pain.  No neurological deficits and normal neuro exam.  Patient able to ambulate without difficulty.  No loss of bowel or bladder control.  No concern for cauda equina.  No fever, night sweats, weight loss, h/o cancer, IVDU.  RICE protocol and pain medicine indicated and discussed with patient.      Pascal Lux [place], PA-C 01/01/13 2211

## 2013-01-01 NOTE — ED Notes (Signed)
Per EMS pt brought in from work with c/o lower back pain that started 2-3 days ago and worsened this morning. Per EMS no hx of chronic back pain, pt recently given birth with epidural.

## 2013-01-03 NOTE — ED Provider Notes (Signed)
Medical screening examination/treatment/procedure(s) were performed by non-physician practitioner and as supervising physician I was immediately available for consultation/collaboration.   Dagmar Hait, MD 01/03/13 [phone]

## 2013-02-04 ENCOUNTER — Ambulatory Visit: Payer: Medicaid Other

## 2013-02-12 ENCOUNTER — Other Ambulatory Visit: Payer: Self-pay

## 2013-04-22 ENCOUNTER — Emergency Department (INDEPENDENT_AMBULATORY_CARE_PROVIDER_SITE_OTHER)
Admission: EM | Admit: 2013-04-22 | Discharge: 2013-04-22 | Disposition: A | Discharge status: Home / Self Care | Payer: Self-pay | Source: Home / Self Care | Attending: Family Medicine | Admitting: Family Medicine

## 2013-04-22 ENCOUNTER — Encounter (HOSPITAL_COMMUNITY): Payer: Self-pay | Admitting: Emergency Medicine

## 2013-04-22 DIAGNOSIS — J111 Influenza due to unidentified influenza virus with other respiratory manifestations: Secondary | ICD-10-CM

## 2013-04-22 DIAGNOSIS — R69 Illness, unspecified: Principal | ICD-10-CM

## 2013-04-22 MED ORDER — OSELTAMIVIR PHOSPHATE 75 MG PO CAPS: 75.0000 mg | ORAL_CAPSULE | Freq: Two times a day (BID) | ORAL | Status: DC

## 2013-04-22 MED ORDER — HYDROCODONE-ACETAMINOPHEN 5-325 MG PO TABS: 0.5000 | ORAL_TABLET | Freq: Every evening | ORAL | Status: DC | PRN

## 2013-04-22 NOTE — ED Notes (Signed)
Reports URI since yesterday; cough, fever, body aches

## 2013-04-22 NOTE — Discharge Instructions (Signed)
Thank you for coming in today. Take Tamiflu twice daily Take up to 2 Aleve twice daily for pain fevers chills body aches sore throat and headache. Use Norco at bedtime for cough suppression as needed.  Make sure your son get seen by a physician tomorrow to get started on Tamiflu Call or go to the emergency room if you get worse, have trouble breathing, have chest pains, or palpitations.

## 2013-04-22 NOTE — ED Provider Notes (Signed)
Tara Bradshaw is a 28 y.o. female who presents to Urgent Care today for chills fevers body ache headache coughing congestion. No shortness of breath nausea vomiting or diarrhea. Patient has tried Mucinex which helps only a little. She did not receive a flu vaccination yet this year. Her symptoms started yesterday. She has a 6 month at home we'll also was not had a flu vaccination. He is currently asymptomatic.   Past Medical History  Diagnosis Date  . Pneumonia     x 1  . Infection     BV X 1  . Anemia     CHRONIC   History  Substance Use Topics  . Smoking status: Never Smoker   . Smokeless tobacco: Never Used  . Alcohol Use: No   ROS as above Medications: No current facility-administered medications for this encounter.   Current Outpatient Prescriptions  Medication Sig Dispense Refill  . ferrous sulfate 325 (65 FE) MG tablet Take 1 tablet (325 mg total) by mouth 2 (two) times daily with a meal.  60 tablet  3  . HYDROcodone-acetaminophen (NORCO/VICODIN) 5-325 MG per tablet Take 0.5 tablets by mouth at bedtime as needed (cough).  6 tablet  0  . Iron-FA-B Cmp-C-Biot-Probiotic (FUSION PLUS) CAPS Take 1 capsule by mouth daily.  30 capsule  4  . NIFEdipine (PROCARDIA-XL/ADALAT-CC/NIFEDICAL-XL) 30 MG 24 hr tablet Take 1 tablet (30 mg total) by mouth daily.  30 tablet  2  . oseltamivir (TAMIFLU) 75 MG capsule Take 1 capsule (75 mg total) by mouth every 12 (twelve) hours.  10 capsule  0    Exam:  BP 141/96  Pulse 105  Temp(Src) 100.5 F (38.1 C) (Oral)  Resp 20  SpO2 99% Gen: Well NAD HEENT: EOMI,  MMM posterior pharynx with erythema. Normal-appearing tympanic membranes. Lungs: Normal work of breathing. CTABL Heart: RRR no MRG Abd: NABS, Soft. NT, ND Exts: Brisk capillary refill, warm and well perfused.   No results found for this or any previous visit (from the past 24 hour(s)). No results found.  Assessment and Plan: 28 y.o. female with influenza-like illness. Plan to  treat with with Tamiflu, hydrocodone containing cough medication and NSAIDs. Recommend she bring her child to a physician for prophylactic Tamiflu.  Discussed warning signs or symptoms. Please see discharge instructions. Patient expresses understanding.    Gregor Hams, MD 04/22/13 [phone]

## 2014-02-08 ENCOUNTER — Encounter (HOSPITAL_COMMUNITY): Payer: Self-pay | Admitting: Emergency Medicine

## 2014-03-18 ENCOUNTER — Encounter (HOSPITAL_COMMUNITY): Payer: Self-pay | Admitting: *Deleted

## 2014-03-18 ENCOUNTER — Emergency Department (INDEPENDENT_AMBULATORY_CARE_PROVIDER_SITE_OTHER)
Admission: EM | Admit: 2014-03-18 | Discharge: 2014-03-18 | Disposition: A | Discharge status: Home / Self Care | Payer: Self-pay | Source: Home / Self Care | Attending: Family Medicine | Admitting: Family Medicine

## 2014-03-18 DIAGNOSIS — J40 Bronchitis, not specified as acute or chronic: Secondary | ICD-10-CM

## 2014-03-18 DIAGNOSIS — J0100 Acute maxillary sinusitis, unspecified: Secondary | ICD-10-CM

## 2014-03-18 MED ORDER — AZITHROMYCIN 250 MG PO TABS: 250.0000 mg | ORAL_TABLET | Freq: Every day | ORAL | Status: DC

## 2014-03-18 MED ORDER — TRAMADOL HCL 50 MG PO TABS: 50.0000 mg | ORAL_TABLET | Freq: Every evening | ORAL | Status: DC | PRN

## 2014-03-18 MED ORDER — PREDNISONE 10 MG PO TABS: 30.0000 mg | ORAL_TABLET | Freq: Every day | ORAL | Status: DC

## 2014-03-18 MED ORDER — IPRATROPIUM BROMIDE 0.06 % NA SOLN: 2.0000 | Freq: Four times a day (QID) | NASAL | Status: DC

## 2014-03-18 NOTE — ED Provider Notes (Signed)
Tara Bradshaw is a 28 y.o. female who presents to Urgent Care today for nasal congestion and cough runny nose sinus pain and pressure and headache. Symptoms present for 2 weeks. Symptoms worsened recently. No fevers or chills or diarrhea. No significant shortness of breath. Patient has had a few episodes of posttussive. Patient has tried some over-the-counter medications which did not help much.   Past Medical History  Diagnosis Date  . Pneumonia     x 1  . Infection     BV X 1  . Anemia     CHRONIC   Past Surgical History  Procedure Laterality Date  . Wisdom tooth extraction     History  Substance Use Topics  . Smoking status: Never Smoker   . Smokeless tobacco: Never Used  . Alcohol Use: No   ROS as above Medications: No current facility-administered medications for this encounter.   Current Outpatient Prescriptions  Medication Sig Dispense Refill  . azithromycin (ZITHROMAX) 250 MG tablet Take 1 tablet (250 mg total) by mouth daily. Take first 2 tablets together, then 1 every day until finished. 6 tablet 0  . ferrous sulfate 325 (65 FE) MG tablet Take 1 tablet (325 mg total) by mouth 2 (two) times daily with a meal. 60 tablet 3  . ipratropium (ATROVENT) 0.06 % nasal spray Place 2 sprays into both nostrils 4 (four) times daily. 15 mL 1  . Iron-FA-B Cmp-C-Biot-Probiotic (FUSION PLUS) CAPS Take 1 capsule by mouth daily. 30 capsule 4  . NIFEdipine (PROCARDIA-XL/ADALAT-CC/NIFEDICAL-XL) 30 MG 24 hr tablet Take 1 tablet (30 mg total) by mouth daily. 30 tablet 2  . predniSONE (DELTASONE) 10 MG tablet Take 3 tablets (30 mg total) by mouth daily. 15 tablet 0  . traMADol (ULTRAM) 50 MG tablet Take 1 tablet (50 mg total) by mouth at bedtime as needed (cough). 10 tablet 0   No Known Allergies   Exam:  BP 121/77 mmHg  Pulse 108  Temp(Src) 98.5 F (36.9 C) (Oral)  Resp 20  SpO2 100% Gen: Well NAD HEENT: EOMI,  MMM posterior pharynx with cobblestoning. Normal tympanic membranes  bilaterally. Clear nasal discharge present. Mildly tender bilateral maxillary sinuses. Lungs: Normal work of breathing. CTABL hoarse voice Heart: RRR no MRG Abd: NABS, Soft. Nondistended, Nontender Exts: Brisk capillary refill, warm and well perfused.   No results found for this or any previous visit (from the past 24 hour(s)). No results found.  Assessment and Plan: 27 y.o. female with bronchitis and sinusitis likely viral. Treatment with prednisone and Atrovent nasal spray and tramadol for cough suppression. Patient has some component of possible second sickening so we will use azithromycin as well.  Discussed warning signs or symptoms. Please see discharge instructions. Patient expresses understanding.     Gregor Hams, MD 03/18/14 [phone]

## 2014-03-18 NOTE — ED Notes (Signed)
Pt   Reports  Symptoms  Of  Cough  Started  Off  As  Non  Productive    Now   Productive  Sputum  With  Blood  Tinged  matrerial   Pt also  Reports   Being  Stuffy     With a  Headache  As well    - pt  Is  Sitting  Upright on  Exam table  Speaking in  Complete  sentances

## 2014-03-18 NOTE — Discharge Instructions (Signed)
Thank you for coming in today. Take prednisone and azithromycin here Use Atrovent nasal spray. Take tramadol for cough as needed.  Call or go to the emergency room if you get worse, have trouble breathing, have chest pains, or palpitations.   Acute Bronchitis Bronchitis is inflammation of the airways that extend from the windpipe into the lungs (bronchi). The inflammation often causes mucus to develop. This leads to a cough, which is the most common symptom of bronchitis.  In acute bronchitis, the condition usually develops suddenly and goes away over time, usually in a couple weeks. Smoking, allergies, and asthma can make bronchitis worse. Repeated episodes of bronchitis may cause further lung problems.  CAUSES Acute bronchitis is most often caused by the same virus that causes a cold. The virus can spread from person to person (contagious) through coughing, sneezing, and touching contaminated objects. SIGNS AND SYMPTOMS   Cough.   Fever.   Coughing up mucus.   Body aches.   Chest congestion.   Chills.   Shortness of breath.   Sore throat.  DIAGNOSIS  Acute bronchitis is usually diagnosed through a physical exam. Your health care provider will also ask you questions about your medical history. Tests, such as chest X-rays, are sometimes done to rule out other conditions.  TREATMENT  Acute bronchitis usually goes away in a couple weeks. Oftentimes, no medical treatment is necessary. Medicines are sometimes given for relief of fever or cough. Antibiotic medicines are usually not needed but may be prescribed in certain situations. In some cases, an inhaler may be recommended to help reduce shortness of breath and control the cough. A cool mist vaporizer may also be used to help thin bronchial secretions and make it easier to clear the chest.  HOME CARE INSTRUCTIONS  Get plenty of rest.   Drink enough fluids to keep your urine clear or pale yellow (unless you have a medical  condition that requires fluid restriction). Increasing fluids may help thin your respiratory secretions (sputum) and reduce chest congestion, and it will prevent dehydration.   Take medicines only as directed by your health care provider.  If you were prescribed an antibiotic medicine, finish it all even if you start to feel better.  Avoid smoking and secondhand smoke. Exposure to cigarette smoke or irritating chemicals will make bronchitis worse. If you are a smoker, consider using nicotine gum or skin patches to help control withdrawal symptoms. Quitting smoking will help your lungs heal faster.   Reduce the chances of another bout of acute bronchitis by washing your hands frequently, avoiding people with cold symptoms, and trying not to touch your hands to your mouth, nose, or eyes.   Keep all follow-up visits as directed by your health care provider.  SEEK MEDICAL CARE IF: Your symptoms do not improve after 1 week of treatment.  SEEK IMMEDIATE MEDICAL CARE IF:  You develop an increased fever or chills.   You have chest pain.   You have severe shortness of breath.  You have bloody sputum.   You develop dehydration.  You faint or repeatedly feel like you are going to pass out.  You develop repeated vomiting.  You develop a severe headache. MAKE SURE YOU:   Understand these instructions.  Will watch your condition.  Will get help right away if you are not doing well or get worse. Document Released: 05/03/2004 Document Revised: 08/10/2013 Document Reviewed: 09/16/2012 [facility] Patient Information 2015 [place], [place]. This information is not intended to replace advice given to  you by your health care provider. Make sure you discuss any questions you have with your health care provider.

## 2015-05-30 ENCOUNTER — Emergency Department (INDEPENDENT_AMBULATORY_CARE_PROVIDER_SITE_OTHER)
Admission: EM | Admit: 2015-05-30 | Discharge: 2015-05-30 | Disposition: A | Discharge status: Home / Self Care | Payer: Self-pay | Source: Home / Self Care | Attending: Family Medicine | Admitting: Family Medicine

## 2015-05-30 ENCOUNTER — Encounter (HOSPITAL_COMMUNITY): Payer: Self-pay | Admitting: *Deleted

## 2015-05-30 DIAGNOSIS — R69 Illness, unspecified: Principal | ICD-10-CM

## 2015-05-30 DIAGNOSIS — J111 Influenza due to unidentified influenza virus with other respiratory manifestations: Secondary | ICD-10-CM

## 2015-05-30 MED ORDER — IPRATROPIUM BROMIDE 0.06 % NA SOLN: 2.0000 | Freq: Four times a day (QID) | NASAL | Status: DC

## 2015-05-30 MED ORDER — GUAIFENESIN-CODEINE 100-10 MG/5ML PO SYRP: 10.0000 mL | ORAL_SOLUTION | Freq: Four times a day (QID) | ORAL | Status: DC | PRN

## 2015-05-30 NOTE — Discharge Instructions (Signed)
Drink plenty of fluids as discussed, use medicine as prescribed, and mucinex or delsym for cough. Return or see your doctor if further problems °

## 2015-05-30 NOTE — ED Provider Notes (Signed)
CSN: [id number]     Arrival date & time 05/30/15  1829 History   First MD Initiated Contact with Patient 05/30/15 2006     Chief Complaint  Patient presents with  . Fever   (Consider location/radiation/quality/duration/timing/severity/associated sxs/prior Treatment) Patient is a 30 y.o. female presenting with fever. The history is provided by the patient.  Fever Severity:  Moderate Onset quality:  Sudden Duration:  2 days Progression:  Unchanged Chronicity:  New Relieved by:  None tried Worsened by:  Nothing tried Ineffective treatments:  None tried Associated symptoms: chills, congestion, cough, myalgias and rhinorrhea   Associated symptoms: no diarrhea, no nausea and no vomiting   Risk factors: sick contacts   Risk factors comment:  Child is sick with same.   Past Medical History  Diagnosis Date  . Pneumonia     x 1  . Infection     BV X 1  . Anemia     CHRONIC   Past Surgical History  Procedure Laterality Date  . Wisdom tooth extraction     Family History  Problem Relation Age of Onset  . Epilepsy Mother   . Cancer Maternal Aunt 52    BREAST  . Hypertension Maternal Grandmother   . Cancer Paternal Grandmother     BREAST  . Asthma Mother    Social History  Substance Use Topics  . Smoking status: Never Smoker   . Smokeless tobacco: Never Used  . Alcohol Use: No   OB History    Gravida Para Term Preterm AB TAB SAB Ectopic Multiple Living   1 1 1       1      Review of Systems  Constitutional: Positive for fever and chills.  HENT: Positive for congestion, postnasal drip and rhinorrhea.   Respiratory: Positive for cough. Negative for shortness of breath and wheezing.   Cardiovascular: Negative.   Gastrointestinal: Negative for nausea, vomiting and diarrhea.  Genitourinary: Negative.   Musculoskeletal: Positive for myalgias.  All other systems reviewed and are negative.   Allergies  Review of patient's allergies indicates no known allergies.  Home  Medications   Prior to Admission medications   Medication Sig Start Date End Date Taking? Authorizing Provider  azithromycin (ZITHROMAX) 250 MG tablet Take 1 tablet (250 mg total) by mouth daily. Take first 2 tablets together, then 1 every day until finished. 03/18/14   Gregor Hams, MD  ferrous sulfate 325 (65 FE) MG tablet Take 1 tablet (325 mg total) by mouth 2 (two) times daily with a meal. 10/16/12   Linda Hedges, CNM  ipratropium (ATROVENT) 0.06 % nasal spray Place 2 sprays into both nostrils 4 (four) times daily. 03/18/14   Gregor Hams, MD  Iron-FA-B Cmp-C-Biot-Probiotic (FUSION PLUS) CAPS Take 1 capsule by mouth daily. 03/26/12   Arville Go, CNM  NIFEdipine (PROCARDIA-XL/ADALAT-CC/NIFEDICAL-XL) 30 MG 24 hr tablet Take 1 tablet (30 mg total) by mouth daily. 10/16/12   Linda Hedges, CNM  predniSONE (DELTASONE) 10 MG tablet Take 3 tablets (30 mg total) by mouth daily. 03/18/14   Gregor Hams, MD  traMADol (ULTRAM) 50 MG tablet Take 1 tablet (50 mg total) by mouth at bedtime as needed (cough). 03/18/14   Gregor Hams, MD   Meds Ordered and Administered this Visit  Medications - No data to display  BP 135/93 mmHg  Pulse 97  Temp(Src) 100.2 F (37.9 C) (Oral)  Resp 18  SpO2 100%  LMP 05/30/2015 No data found.   Physical Exam  Constitutional: She is oriented to person, place, and time. She appears well-developed and well-nourished. No distress.  HENT:  Right Ear: External ear normal.  Mouth/Throat: Oropharynx is clear and moist.  Neck: Normal range of motion. Neck supple.  Pulmonary/Chest: Effort normal and breath sounds normal.  Abdominal: Soft. Bowel sounds are normal.  Lymphadenopathy:    She has no cervical adenopathy.  Neurological: She is alert and oriented to person, place, and time.  Skin: Skin is warm and dry.  Nursing note and vitals reviewed.   ED Course  Procedures (including critical care time)  Labs Review Labs Reviewed - No data to  display  Imaging Review No results found.   Visual Acuity Review  Right Eye Distance:   Left Eye Distance:   Bilateral Distance:    Right Eye Near:   Left Eye Near:    Bilateral Near:         MDM  No diagnosis found. Meds ordered this encounter  Medications  . ipratropium (ATROVENT) 0.06 % nasal spray    Sig: Place 2 sprays into both nostrils 4 (four) times daily.    Dispense:  15 mL    Refill:  1  . guaiFENesin-codeine (ROBITUSSIN AC) 100-10 MG/5ML syrup    Sig: Take 10 mLs by mouth 4 (four) times daily as needed for cough.    Dispense:  180 mL    Refill:  0       Billy Fischer, MD 05/30/15 2035

## 2015-05-30 NOTE — ED Notes (Signed)
Pt  Reports   Symptoms  Of  Body  Aches  Fever    As   Well  s  A   Cough   That  For  The  Most  Part is  Non  Productive      Child  Is  Ill    With  Similar  Symptoms  As   Well

## 2015-06-07 NOTE — ED Notes (Signed)
Pt  Reports   She  Needs  A  Note   For    Work     [employer]  Work  Excuse  For  3  Days

## 2015-08-19 ENCOUNTER — Ambulatory Visit (HOSPITAL_COMMUNITY)
Admission: EM | Admit: 2015-08-19 | Discharge: 2015-08-19 | Disposition: A | Discharge status: Home / Self Care | Payer: PRIVATE HEALTH INSURANCE | Attending: Emergency Medicine | Admitting: Emergency Medicine

## 2015-08-19 ENCOUNTER — Encounter (HOSPITAL_COMMUNITY): Payer: Self-pay | Admitting: Nurse Practitioner

## 2015-08-19 ENCOUNTER — Ambulatory Visit (INDEPENDENT_AMBULATORY_CARE_PROVIDER_SITE_OTHER): Payer: PRIVATE HEALTH INSURANCE

## 2015-08-19 DIAGNOSIS — M25562 Pain in left knee: Secondary | ICD-10-CM

## 2015-08-19 MED ORDER — MELOXICAM 15 MG PO TABS: 15.0000 mg | ORAL_TABLET | Freq: Every day | ORAL | Status: DC

## 2015-08-19 NOTE — ED Provider Notes (Signed)
CSN: [id number]     Arrival date & time 08/19/15  1457 History   First MD Initiated Contact with Patient 08/19/15 1608     Chief Complaint  Patient presents with  . Knee Pain   (Consider location/radiation/quality/duration/timing/severity/associated sxs/prior Treatment) HPI Comments: Patient is 30 yo black female who presents with a 4-6 week history of left knee pain. No known injury. Pain with weight bear with ROM. Question swelling. No warmth. Pain mostly along the posterior aspect. No locking or giving way.   The history is provided by the patient.    Past Medical History  Diagnosis Date  . Pneumonia     x 1  . Infection     BV X 1  . Anemia     CHRONIC   Past Surgical History  Procedure Laterality Date  . Wisdom tooth extraction     Family History  Problem Relation Age of Onset  . Epilepsy Mother   . Cancer Maternal Aunt 52    BREAST  . Hypertension Maternal Grandmother   . Cancer Paternal Grandmother     BREAST  . Asthma Mother    Social History  Substance Use Topics  . Smoking status: Never Smoker   . Smokeless tobacco: Never Used  . Alcohol Use: No   OB History    Gravida Para Term Preterm AB TAB SAB Ectopic Multiple Living   1 1 1       1      Review of Systems  All other systems reviewed and are negative.   Allergies  Review of patient's allergies indicates no known allergies.  Home Medications   Prior to Admission medications   Medication Sig Start Date End Date Taking? Authorizing Provider  azithromycin (ZITHROMAX) 250 MG tablet Take 1 tablet (250 mg total) by mouth daily. Take first 2 tablets together, then 1 every day until finished. 03/18/14   Gregor Hams, MD  ferrous sulfate 325 (65 FE) MG tablet Take 1 tablet (325 mg total) by mouth 2 (two) times daily with a meal. 10/16/12   Linda Hedges, CNM  guaiFENesin-codeine (ROBITUSSIN AC) 100-10 MG/5ML syrup Take 10 mLs by mouth 4 (four) times daily as needed for cough. 05/30/15   Billy Fischer, MD   ipratropium (ATROVENT) 0.06 % nasal spray Place 2 sprays into both nostrils 4 (four) times daily. 05/30/15   Billy Fischer, MD  Iron-FA-B Cmp-C-Biot-Probiotic (FUSION PLUS) CAPS Take 1 capsule by mouth daily. 03/26/12   Arville Go, CNM  meloxicam (MOBIC) 15 MG tablet Take 1 tablet (15 mg total) by mouth daily. 08/19/15   Bjorn Pippin, PA-C  NIFEdipine (PROCARDIA-XL/ADALAT-CC/NIFEDICAL-XL) 30 MG 24 hr tablet Take 1 tablet (30 mg total) by mouth daily. 10/16/12   Linda Hedges, CNM  predniSONE (DELTASONE) 10 MG tablet Take 3 tablets (30 mg total) by mouth daily. 03/18/14   Gregor Hams, MD  traMADol (ULTRAM) 50 MG tablet Take 1 tablet (50 mg total) by mouth at bedtime as needed (cough). 03/18/14   Gregor Hams, MD   Meds Ordered and Administered this Visit  Medications - No data to display  BP 125/72 mmHg  Pulse 62  Temp(Src) 98.4 F (36.9 C) (Oral)  Resp 16  SpO2 100%  LMP 08/18/2015 No data found.   Physical Exam  Constitutional: She is oriented to person, place, and time. She appears well-developed and well-nourished. No distress.  Musculoskeletal:  No erythema, swelling or warmth of the left knee. No effusion. Tenderness along the  posterior knee and lateral aspect. Pain with full flexion. Positive patellar aprehension  Neurological: She is alert and oriented to person, place, and time.  Skin: Skin is warm and dry. No rash noted. She is not diaphoretic. No erythema.  Psychiatric: Her behavior is normal.  Nursing note and vitals reviewed.   ED Course  Procedures (including critical care time)  Labs Review Labs Reviewed - No data to display  Imaging Review Dg Knee Complete 4 Views Left  08/19/2015  CLINICAL DATA:  Left knee pain for 1 month, no known injury EXAM: LEFT KNEE - COMPLETE 4+ VIEW COMPARISON:  None. FINDINGS: Four views of the left knee submitted. No acute fracture or subluxation. Minimal narrowing of medial joint compartment. No joint effusion. No pathologic  calcifications are noted. IMPRESSION: No acute fracture or subluxation. Minimal narrowing of medial joint compartment. No joint effusion. Electronically Signed   By: Lahoma Crocker M.D.   On: 08/19/2015 16:46     Visual Acuity Review  Right Eye Distance:   Left Eye Distance:   Bilateral Distance:    Right Eye Near:   Left Eye Near:    Bilateral Near:         MDM   1. Knee pain, left    Etiology unclear. No instability. No effusion or emergent needs. X-rays are normal. Treat with NSAID's and icing. F/U with Orthopedics if not improving.    Bjorn Pippin, PA-C 08/19/15 1709

## 2015-08-19 NOTE — ED Notes (Signed)
She c/o 1.5 month history of Left knee pain and swelling. She denies any injuries. She has tried epsom salt and OTC creams with no relief. Pain is worse with palpation and ambulation.

## 2015-08-19 NOTE — Discharge Instructions (Signed)
Unsure the cause of your knee pain, but xrays look ok. Suggest use of Meloxicam daily for inflammation and schedule an appt with Orthopedics for further evaluation. Activity as tolerated.    Joint Pain Joint pain, which is also called arthralgia, can be caused by many things. Joint pain often goes away when you follow your health care provider's instructions for relieving pain at home. However, joint pain can also be caused by conditions that require further treatment. Common causes of joint pain include:  Bruising in the area of the joint.  Overuse of the joint.  Wear and tear on the joints that occur with aging (osteoarthritis).  Various other forms of arthritis.  A buildup of a crystal form of uric acid in the joint (gout).  Infections of the joint (septic arthritis) or of the bone (osteomyelitis). Your health care provider may recommend medicine to help with the pain. If your joint pain continues, additional tests may be needed to diagnose your condition. HOME CARE INSTRUCTIONS Watch your condition for any changes. Follow these instructions as directed to lessen the pain that you are feeling.  Take medicines only as directed by your health care provider.  Rest the affected area for as long as your health care provider says that you should. If directed to do so, raise the painful joint above the level of your heart while you are sitting or lying down.  Do not do things that cause or worsen pain.  If directed, apply ice to the painful area:  Put ice in a plastic bag.  Place a towel between your skin and the bag.  Leave the ice on for 20 minutes, 2-3 times per day.  Wear an elastic bandage, splint, or sling as directed by your health care provider. Loosen the elastic bandage or splint if your fingers or toes become numb and tingle, or if they turn cold and blue.  Begin exercising or stretching the affected area as directed by your health care provider. Ask your health care  provider what types of exercise are safe for you.  Keep all follow-up visits as directed by your health care provider. This is important. SEEK MEDICAL CARE IF:  Your pain increases, and medicine does not help.  Your joint pain does not improve within 3 days.  You have increased bruising or swelling.  You have a fever.  You lose 10 lb (4.5 kg) or more without trying. SEEK IMMEDIATE MEDICAL CARE IF:  You are not able to move the joint.  Your fingers or toes become numb or they turn cold and blue.   This information is not intended to replace advice given to you by your health care provider. Make sure you discuss any questions you have with your health care provider.   Document Released: 03/26/2005 Document Revised: 04/16/2014 Document Reviewed: 01/05/2014 Elsevier Interactive Patient Education [employer].

## 2015-08-22 ENCOUNTER — Telehealth (HOSPITAL_COMMUNITY): Payer: Self-pay

## 2015-08-22 NOTE — ED Notes (Signed)
Patient was given in information for [facility].  They were on-call for [facility] unassigned the day she was seen.

## 2015-10-15 ENCOUNTER — Other Ambulatory Visit: Payer: Self-pay | Admitting: Sports Medicine

## 2015-10-15 DIAGNOSIS — M25562 Pain in left knee: Secondary | ICD-10-CM

## 2015-10-15 DIAGNOSIS — R531 Weakness: Secondary | ICD-10-CM

## 2015-10-22 ENCOUNTER — Ambulatory Visit
Admission: RE | Admit: 2015-10-22 | Discharge: 2015-10-22 | Disposition: A | Discharge status: Home / Self Care | Payer: PRIVATE HEALTH INSURANCE | Source: Ambulatory Visit | Attending: Sports Medicine | Admitting: Sports Medicine

## 2015-10-22 DIAGNOSIS — R531 Weakness: Secondary | ICD-10-CM

## 2015-10-22 DIAGNOSIS — M25562 Pain in left knee: Secondary | ICD-10-CM

## 2016-10-26 ENCOUNTER — Encounter: Payer: Self-pay | Admitting: Obstetrics & Gynecology

## 2016-10-26 ENCOUNTER — Ambulatory Visit (INDEPENDENT_AMBULATORY_CARE_PROVIDER_SITE_OTHER): Payer: PRIVATE HEALTH INSURANCE

## 2016-10-26 DIAGNOSIS — Z3201 Encounter for pregnancy test, result positive: Secondary | ICD-10-CM

## 2016-10-26 LAB — POCT PREGNANCY, URINE: Preg Test, Ur: POSITIVE — AB

## 2016-10-26 NOTE — Progress Notes (Signed)
Patient presented to the  office for a pregnancy test. Test confirms she is pregnant around 15 weeks. I have advised patient to start taking prenatal vitamins at this time. She has been given a letter of verification to apply for medicaid. Patient plans to follow up with her ob/gyn of choice.

## 2016-10-31 ENCOUNTER — Inpatient Hospital Stay (HOSPITAL_COMMUNITY)
Admission: AD | Admit: 2016-10-31 | Discharge: 2016-10-31 | Disposition: A | Discharge status: Home / Self Care | Payer: Medicaid Other | Source: Ambulatory Visit | Attending: Obstetrics & Gynecology | Admitting: Obstetrics & Gynecology

## 2016-10-31 ENCOUNTER — Encounter (HOSPITAL_COMMUNITY): Payer: Self-pay | Admitting: *Deleted

## 2016-10-31 ENCOUNTER — Inpatient Hospital Stay (HOSPITAL_COMMUNITY): Payer: Medicaid Other

## 2016-10-31 DIAGNOSIS — R109 Unspecified abdominal pain: Secondary | ICD-10-CM

## 2016-10-31 DIAGNOSIS — O210 Mild hyperemesis gravidarum: Secondary | ICD-10-CM | POA: Diagnosis not present

## 2016-10-31 DIAGNOSIS — R1011 Right upper quadrant pain: Secondary | ICD-10-CM | POA: Insufficient documentation

## 2016-10-31 DIAGNOSIS — Z3A01 Less than 8 weeks gestation of pregnancy: Secondary | ICD-10-CM | POA: Diagnosis not present

## 2016-10-31 DIAGNOSIS — O26891 Other specified pregnancy related conditions, first trimester: Secondary | ICD-10-CM | POA: Insufficient documentation

## 2016-10-31 DIAGNOSIS — O26899 Other specified pregnancy related conditions, unspecified trimester: Secondary | ICD-10-CM

## 2016-10-31 DIAGNOSIS — O219 Vomiting of pregnancy, unspecified: Secondary | ICD-10-CM

## 2016-10-31 LAB — URINALYSIS, ROUTINE W REFLEX MICROSCOPIC
Bacteria, UA: NONE SEEN
Bilirubin Urine: NEGATIVE
GLUCOSE, UA: NEGATIVE mg/dL
HGB URINE DIPSTICK: NEGATIVE
Ketones, ur: NEGATIVE mg/dL
NITRITE: NEGATIVE
PH: 5 (ref 5.0–8.0)
PROTEIN: NEGATIVE mg/dL
Specific Gravity, Urine: 1.025 (ref 1.005–1.030)

## 2016-10-31 LAB — HCG, QUANTITATIVE, PREGNANCY: hCG, Beta Chain, Quant, S: 89622 m[IU]/mL — ABNORMAL HIGH (ref <5)

## 2016-10-31 MED ORDER — PROMETHAZINE HCL 12.5 MG PO TABS: 12.5000 mg | ORAL_TABLET | Freq: Four times a day (QID) | ORAL | 0 refills | Status: DC | PRN

## 2016-10-31 NOTE — Progress Notes (Addendum)
G2P1 @ 15.[redacted] wksga. Presents to triage for abdominal lower pain past two days. Denies LOF or bleeding. + flutter.   Doppler: unable to obtain  1701: Provider at bs. Doppler taken to bs with provider. bs u/s brought in room.   1743: pt to u/s via ambulatory.   1810: back from U/S  1926: Discharge instructions given with pt understanding. Pt left unit via ambulatory

## 2016-10-31 NOTE — MAU Provider Note (Signed)
History     CSN: [id number]  Arrival date and time: 10/31/16 1521   First Provider Initiated Contact with Patient 10/31/16 1657      Chief Complaint  Patient presents with  . Abdominal Injury  . Hematemesis   Tara Bradshaw is a 31 y.o. G2P1001 at [redacted]w[redacted]d by LMP presenting with 1 day history of intermittent right suprapubic abdominal pain. Pain is sharp and radiates to right upper quadrant at times. It's worse when she stands up or with pressure. Alleviated by rest. Positive HPT 2 weeks ago. She also has been having heartburn and nausea for about 2 weeks similar to GERD symptoms she has had in the past. She reports spitting and coughing with one episode blood streaked sputum.     OB History  Gravida Para Term Preterm AB Living  2 1 1     1   SAB TAB Ectopic Multiple Live Births          1    # Outcome Date GA Lbr Len/2nd Weight Sex Delivery Anes PTL Lv  2 Current           1 Term 10/14/12 [redacted]w[redacted]d 13:31 / 01:23 6 lb 7.2 oz (2.926 kg) M Vag-Spont EPI  LIV       Past Medical History:  Diagnosis Date  . Anemia    CHRONIC  . Infection    BV X 1  . Pneumonia    x 1    Past Surgical History:  Procedure Laterality Date  . WISDOM TOOTH EXTRACTION      Family History  Problem Relation Age of Onset  . Epilepsy Mother   . Asthma Mother   . Cancer Paternal Grandmother        BREAST  . Cancer Maternal Aunt 52       BREAST  . Hypertension Maternal Grandmother     Social History  Substance Use Topics  . Smoking status: Never Smoker  . Smokeless tobacco: Never Used  . Alcohol use No    Allergies: No Known Allergies  Prescriptions Prior to Admission  Medication Sig Dispense Refill Last Dose  . Prenatal Vit-Fe Fumarate-FA (PRENATAL MULTIVITAMIN) TABS tablet Take 1 tablet by mouth daily at 12 noon.   10/31/2016 at Unknown time    Review of Systems  Constitutional: Positive for fatigue.  Gastrointestinal: Positive for abdominal pain and nausea. Negative for  constipation, diarrhea and vomiting.  Genitourinary: Negative for dysuria, frequency, hematuria, urgency, vaginal bleeding and vaginal discharge.  Musculoskeletal: Negative for back pain.  Psychiatric/Behavioral: The patient is not nervous/anxious.    Physical Exam   Blood pressure 135/70, pulse 71, temperature 98.2 F (36.8 C), resp. rate 18, height 5\' 1"  (1.549 m), weight 262 lb (118.8 kg), last menstrual period 07/13/2016.  Physical Exam  Nursing note and vitals reviewed. Constitutional: She is oriented to person, place, and time. No distress.  Obese  HENT:  Head: Normocephalic.  Eyes: Pupils are equal, round, and reactive to light.  Neck: Neck supple.  Cardiovascular: Normal rate.   Respiratory: Effort normal.  GI: Soft. There is tenderness. There is no rebound and no guarding.  Mild-moderate TTP right suprapubic; mild TTP mid suprapubic, no upper abd tenderness  Genitourinary:  Genitourinary Comments: VE: Cx long closed Uterus: not appreciably enlarged, exam limited by body habitus  Musculoskeletal: Normal range of motion.  No Back tenderness; no CVAT  Neurological: She is alert and oriented to person, place, and time.  Skin: Skin is warm.  MAU Course  Procedures Results for orders placed or performed during the hospital encounter of 10/31/16 (from the past 24 hour(s))  Urinalysis, Routine w reflex microscopic     Status: Abnormal   Collection Time: 10/31/16  3:50 PM  Result Value Ref Range   Color, Urine YELLOW YELLOW   APPearance HAZY (A) CLEAR   Specific Gravity, Urine 1.025 1.005 - 1.030   pH 5.0 5.0 - 8.0   Glucose, UA NEGATIVE NEGATIVE mg/dL   Hgb urine dipstick NEGATIVE NEGATIVE   Bilirubin Urine NEGATIVE NEGATIVE   Ketones, ur NEGATIVE NEGATIVE mg/dL   Protein, ur NEGATIVE NEGATIVE mg/dL   Nitrite NEGATIVE NEGATIVE   Leukocytes, UA MODERATE (A) NEGATIVE   RBC / HPF 0-5 0 - 5 RBC/hpf   WBC, UA 6-30 0 - 5 WBC/hpf   Bacteria, UA NONE SEEN NONE SEEN    Squamous Epithelial / LPF 6-30 (A) NONE SEEN   Mucous PRESENT    Urine culture sent  No FHR by DT> bedside US with no definite embryo seen US Ob Comp Less 14 Wks  Result Date: 10/31/2016 CLINICAL DATA:  31 year old female reportedly 15 weeks 5 days pregnant (bite last menstrual. ) with lower midline abdominal pain EXAM: OBSTETRIC <14 WK [place] AND TRANSVAGINAL OB US TECHNIQUE: Both transabdominal and transvaginal ultrasound examinations were performed for complete evaluation of the gestation as well as the maternal uterus, adnexal regions, and pelvic cul-de-sac. Transvaginal technique was performed to assess early pregnancy. COMPARISON:  None. FINDINGS: Intrauterine gestational sac: Single Yolk sac:  Visualized. Embryo:  Visualized. Cardiac Activity: Visualized. Heart Rate: 154  bpm CRL:  15  Mm   7 w   6 d                  [place] EDC: June 13, 2017 Subchorionic hemorrhage:  Small Maternal uterus/adnexae: Normal appearance of the ovaries. Heterogeneous soft tissue nodule in the posterior aspect of the right uterus measures 2.6 cm and is most consistent with a sternal uterine fibroid. IMPRESSION: 1. Single live intrauterine pregnancy. By crown-rump length, the estimated gestational age is 7 weeks 6 days and the estimated date of confinement is June 13, 2017. 2. The fetal heart rate is 154 beats per minute. 3. Small subchorionic hemorrhage. 4. Incidental note is made of a 2.6 cm uterine fibroid along the right posterior uterine wall. Electronically Signed   By: Jacqulynn Cadet M.D.   On: 10/31/2016 18:38   US Ob Transvaginal  Result Date: 10/31/2016 CLINICAL DATA:  31 year old female reportedly 15 weeks 5 days pregnant (bite last menstrual. ) with lower midline abdominal pain EXAM: OBSTETRIC <14 WK [place] AND TRANSVAGINAL OB US TECHNIQUE: Both transabdominal and transvaginal ultrasound examinations were performed for complete evaluation of the gestation as well as the maternal uterus, adnexal regions, and pelvic  cul-de-sac. Transvaginal technique was performed to assess early pregnancy. COMPARISON:  None. FINDINGS: Intrauterine gestational sac: Single Yolk sac:  Visualized. Embryo:  Visualized. Cardiac Activity: Visualized. Heart Rate: 154  bpm CRL:  15  Mm   7 w   6 d                  [place] EDC: June 13, 2017 Subchorionic hemorrhage:  Small Maternal uterus/adnexae: Normal appearance of the ovaries. Heterogeneous soft tissue nodule in the posterior aspect of the right uterus measures 2.6 cm and is most consistent with a sternal uterine fibroid. IMPRESSION: 1. Single live intrauterine pregnancy. By crown-rump length, the estimated gestational age is 103  weeks 6 days and the estimated date of confinement is June 13, 2017. 2. The fetal heart rate is 154 beats per minute. 3. Small subchorionic hemorrhage. 4. Incidental note is made of a 2.6 cm uterine fibroid along the right posterior uterine wall. Electronically Signed   By: Jacqulynn Cadet M.D.   On: 10/31/2016 18:38     Assessment and Plan  G2P1001  Viable IUP [redacted]w[redacted]d Nausea and vomiting during pregnancy prior to [redacted] weeks gestation  Abdominal pain in pregnancy, unspecified trimester - Plan: US OB Comp Less 14 Wks, US OB Comp Less 14 Wks  Allergies as of 10/31/2016   No Known Allergies     Medication List    TAKE these medications   prenatal multivitamin Tabs tablet Take 1 tablet by mouth daily at 12 noon.   promethazine 12.5 MG tablet Commonly known as:  PHENERGAN Take 1 tablet (12.5 mg total) by mouth every 6 (six) hours as needed for nausea or vomiting.      Follow-up [facility] Follow up on 11/21/2017.   Specialty:  Obstetrics and Gynecology Contact information: [place] [phone]          Lorene Dy, CNM 10/31/2016 7:00 PM

## 2016-10-31 NOTE — MAU Note (Signed)
Pt presents to MAU with complaints of lower abdominal cramping. Pt states she has been coughing and noticed blood this morning,

## 2016-10-31 NOTE — Discharge Instructions (Signed)
Morning Sickness °Morning sickness is when you feel sick to your stomach (nauseous) during pregnancy. This nauseous feeling may or may not come with vomiting. It often occurs in the morning but can be a problem any time of day. Morning sickness is most common during the first trimester, but it may continue throughout pregnancy. While morning sickness is unpleasant, it is usually harmless unless you develop severe and continual vomiting (hyperemesis gravidarum). This condition requires more intense treatment. °What are the causes? °The cause of morning sickness is not completely known but seems to be related to normal hormonal changes that occur in pregnancy. °What increases the risk? °You are at greater risk if you: °· Experienced nausea or vomiting before your pregnancy. °· Had morning sickness during a previous pregnancy. °· Are pregnant with more than one baby, such as twins. ° °How is this treated? °Do not use any medicines (prescription, over-the-counter, or herbal) for morning sickness without first talking to your health care provider. Your health care provider may prescribe or recommend: °· Vitamin B6 supplements. °· Anti-nausea medicines. °· The herbal medicine ginger. ° °Follow these instructions at home: °· Only take over-the-counter or prescription medicines as directed by your health care provider. °· Taking multivitamins before getting pregnant can prevent or decrease the severity of morning sickness in most women. °· Eat a piece of dry toast or unsalted crackers before getting out of bed in the morning. °· Eat five or six small meals a day. °· Eat dry and bland foods (rice, baked potato). Foods high in carbohydrates are often helpful. °· Do not drink liquids with your meals. Drink liquids between meals. °· Avoid greasy, fatty, and spicy foods. °· Get someone to cook for you if the smell of any food causes nausea and vomiting. °· If you feel nauseous after taking prenatal vitamins, take the vitamins at  night or with a snack. °· Snack on protein foods (nuts, yogurt, cheese) between meals if you are hungry. °· Eat unsweetened gelatins for desserts. °· Wearing an acupressure wristband (worn for sea sickness) may be helpful. °· Acupuncture may be helpful. °· Do not smoke. °· Get a humidifier to keep the air in your house free of odors. °· Get plenty of fresh air. °Contact a health care provider if: °· Your home remedies are not working, and you need medicine. °· You feel dizzy or lightheaded. °· You are losing weight. °Get help right away if: °· You have persistent and uncontrolled nausea and vomiting. °· You pass out (faint). °This information is not intended to replace advice given to you by your health care provider. Make sure you discuss any questions you have with your health care provider. °Document Released: 05/17/2006 Document Revised: 09/01/2015 Document Reviewed: 09/10/2012 °Elsevier Interactive Patient Education © 2017 [employer] ° °

## 2016-11-02 LAB — URINE CULTURE

## 2016-11-20 ENCOUNTER — Encounter (HOSPITAL_COMMUNITY): Payer: Self-pay

## 2016-11-20 ENCOUNTER — Inpatient Hospital Stay (HOSPITAL_COMMUNITY)
Admission: AD | Admit: 2016-11-20 | Discharge: 2016-11-20 | Disposition: A | Discharge status: Home / Self Care | Payer: Medicaid Other | Source: Ambulatory Visit | Attending: Family Medicine | Admitting: Family Medicine

## 2016-11-20 ENCOUNTER — Ambulatory Visit (HOSPITAL_COMMUNITY)
Admission: EM | Admit: 2016-11-20 | Discharge: 2016-11-20 | Disposition: A | Discharge status: Home / Self Care | Payer: PRIVATE HEALTH INSURANCE

## 2016-11-20 DIAGNOSIS — K219 Gastro-esophageal reflux disease without esophagitis: Secondary | ICD-10-CM | POA: Diagnosis not present

## 2016-11-20 DIAGNOSIS — M549 Dorsalgia, unspecified: Secondary | ICD-10-CM | POA: Diagnosis present

## 2016-11-20 DIAGNOSIS — O9989 Other specified diseases and conditions complicating pregnancy, childbirth and the puerperium: Secondary | ICD-10-CM | POA: Diagnosis not present

## 2016-11-20 DIAGNOSIS — Z825 Family history of asthma and other chronic lower respiratory diseases: Secondary | ICD-10-CM | POA: Insufficient documentation

## 2016-11-20 DIAGNOSIS — Z3A10 10 weeks gestation of pregnancy: Secondary | ICD-10-CM | POA: Insufficient documentation

## 2016-11-20 DIAGNOSIS — O99611 Diseases of the digestive system complicating pregnancy, first trimester: Secondary | ICD-10-CM | POA: Insufficient documentation

## 2016-11-20 DIAGNOSIS — Z820 Family history of epilepsy and other diseases of the nervous system: Secondary | ICD-10-CM | POA: Diagnosis not present

## 2016-11-20 LAB — URINALYSIS, ROUTINE W REFLEX MICROSCOPIC
BILIRUBIN URINE: NEGATIVE
Glucose, UA: NEGATIVE mg/dL
Hgb urine dipstick: NEGATIVE
KETONES UR: NEGATIVE mg/dL
Nitrite: NEGATIVE
PROTEIN: NEGATIVE mg/dL
SPECIFIC GRAVITY, URINE: 1.018 (ref 1.005–1.030)
pH: 6 (ref 5.0–8.0)

## 2016-11-20 MED ORDER — METOCLOPRAMIDE HCL 5 MG/ML IJ SOLN
10.0000 mg | INTRAMUSCULAR | Status: AC
  Administered 2016-11-20: 10 mg via INTRAVENOUS
  Filled 2016-11-20: qty 2

## 2016-11-20 MED ORDER — RANITIDINE HCL 150 MG PO TABS: 150.0000 mg | ORAL_TABLET | Freq: Two times a day (BID) | ORAL | 1 refills | Status: DC

## 2016-11-20 MED ORDER — LACTATED RINGERS IV BOLUS (SEPSIS)
1000.0000 mL | Freq: Once | INTRAVENOUS | Status: AC
  Administered 2016-11-20: 1000 mL via INTRAVENOUS

## 2016-11-20 NOTE — MAU Note (Signed)
+   mid lower back pain tightness Rating pain 7-8/10 Constant radiates down legs  +vomiting 3-4 episodes today Unable to hold down food or drink today

## 2016-11-20 NOTE — MAU Provider Note (Signed)
History     CSN: [id number]  Arrival date and time: 11/20/16 1329   First Provider Initiated Contact with Patient 11/20/16 1421      Chief Complaint  Patient presents with  . Back Pain  . Emesis   HPI  [name] is a 31 yo G2P1001 at 10.[redacted] wks gestation presenting to MAU with complaints of vomiting, "unable to keep anything down all day", mid to low back pain, and abdominal tightening that radiates to lower legs.  She has h/o GERD.  She reports having 3-4 episodes of vomiting today.  She hasn't taken the Phenergan she was prescribed, because "it makes me sleepy".  She rates the pain a 7-8/10.  She denies VB.  Past Medical History:  Diagnosis Date  . Anemia    CHRONIC  . Infection    BV X 1  . Pneumonia    x 1    Past Surgical History:  Procedure Laterality Date  . WISDOM TOOTH EXTRACTION      Family History  Problem Relation Age of Onset  . Epilepsy Mother   . Asthma Mother   . Cancer Paternal Grandmother        BREAST  . Cancer Maternal Aunt 52       BREAST  . Hypertension Maternal Grandmother     Social History  Substance Use Topics  . Smoking status: Never Smoker  . Smokeless tobacco: Never Used  . Alcohol use No    Allergies: No Known Allergies  Prescriptions Prior to Admission  Medication Sig Dispense Refill Last Dose  . Prenatal Vit-Fe Fumarate-FA (PRENATAL MULTIVITAMIN) TABS tablet Take 1 tablet by mouth daily at 12 noon.   11/20/2016 at Unknown time  . promethazine (PHENERGAN) 12.5 MG tablet Take 1 tablet (12.5 mg total) by mouth every 6 (six) hours as needed for nausea or vomiting. 30 tablet 0 Past Month at Unknown time    Review of Systems  Constitutional: Negative.   HENT: Negative.   Eyes: Negative.   Respiratory: Negative.   Cardiovascular: Negative.   Gastrointestinal: Positive for vomiting. Negative for nausea.  Endocrine: Negative.   Genitourinary: Negative.   Musculoskeletal: Positive for back pain (mid to low).  Skin:  Negative.   Allergic/Immunologic: Negative.   Neurological: Negative.   Hematological: Negative.   Psychiatric/Behavioral: Negative.    Physical Exam   Blood pressure 129/67, pulse 60, temperature 98.3 F (36.8 C), temperature source Oral, resp. rate 18, weight 117.5 kg (259 lb 1.3 oz), last menstrual period 07/13/2016, SpO2 100 %.  Physical Exam  Constitutional: She is oriented to person, place, and time. She appears well-developed and well-nourished.  HENT:  Head: Normocephalic.  Eyes: Pupils are equal, round, and reactive to light.  Neck: Normal range of motion.  Cardiovascular: Normal rate, regular rhythm and normal heart sounds.   Respiratory: Effort normal and breath sounds normal.  GI: Soft. Bowel sounds are normal.  Genitourinary:  Genitourinary Comments: Informal U/S with fetus S=D, (+) cardiac activity visualized at 165 bpm  Musculoskeletal: Normal range of motion.  Neurological: She is alert and oriented to person, place, and time.  Skin: Skin is warm and dry.  Psychiatric: She has a normal mood and affect. Her behavior is normal. Judgment and thought content normal.    MAU Course  Procedures  MDM CCUA Informal Bedside U/S LR bolus Reglan 10 mg IVP -- patient feeling better since IVFs and medication  Results for orders placed or performed during the hospital encounter of 11/20/16 (  from the past 24 hour(s))  Urinalysis, Routine w reflex microscopic     Status: Abnormal   Collection Time: 11/20/16  1:32 PM  Result Value Ref Range   Color, Urine YELLOW YELLOW   APPearance HAZY (A) CLEAR   Specific Gravity, Urine 1.018 1.005 - 1.030   pH 6.0 5.0 - 8.0   Glucose, UA NEGATIVE NEGATIVE mg/dL   Hgb urine dipstick NEGATIVE NEGATIVE   Bilirubin Urine NEGATIVE NEGATIVE   Ketones, ur NEGATIVE NEGATIVE mg/dL   Protein, ur NEGATIVE NEGATIVE mg/dL   Nitrite NEGATIVE NEGATIVE   Leukocytes, UA TRACE (A) NEGATIVE   RBC / HPF 0-5 0 - 5 RBC/hpf   WBC, UA 0-5 0 - 5 WBC/hpf    Bacteria, UA RARE (A) NONE SEEN   Squamous Epithelial / LPF 0-5 (A) NONE SEEN   Mucous PRESENT    Assessment and Plan  Gastroesophageal reflux disease without esophagitis - Rx Zantac 150 mg po BID  - Continue to use Phenergan for acute N/V - Keep NOB appt on 8/20  Discharge home Patient verbalized an understanding of the plan of care and agrees.   Laury Deep, MSN, CNM 11/20/2016, 2:27 PM

## 2016-11-26 ENCOUNTER — Inpatient Hospital Stay (HOSPITAL_COMMUNITY)
Admission: AD | Admit: 2016-11-26 | Discharge: 2016-11-26 | Disposition: A | Discharge status: Home / Self Care | Payer: Medicaid Other | Source: Ambulatory Visit | Attending: Obstetrics and Gynecology | Admitting: Obstetrics and Gynecology

## 2016-11-26 ENCOUNTER — Ambulatory Visit (INDEPENDENT_AMBULATORY_CARE_PROVIDER_SITE_OTHER): Payer: Medicaid Other | Admitting: Obstetrics and Gynecology

## 2016-11-26 ENCOUNTER — Ambulatory Visit: Payer: Self-pay

## 2016-11-26 ENCOUNTER — Inpatient Hospital Stay (HOSPITAL_COMMUNITY): Payer: Medicaid Other

## 2016-11-26 ENCOUNTER — Other Ambulatory Visit (HOSPITAL_COMMUNITY)
Admission: RE | Admit: 2016-11-26 | Discharge: 2016-11-26 | Disposition: A | Discharge status: Home / Self Care | Payer: Medicaid Other | Source: Ambulatory Visit | Attending: Obstetrics and Gynecology | Admitting: Obstetrics and Gynecology

## 2016-11-26 ENCOUNTER — Telehealth: Payer: Self-pay

## 2016-11-26 ENCOUNTER — Encounter: Payer: Self-pay | Admitting: Obstetrics and Gynecology

## 2016-11-26 VITALS — BP 134/47 | HR 46 | Wt 255.8 lb

## 2016-11-26 DIAGNOSIS — O2691 Pregnancy related conditions, unspecified, first trimester: Secondary | ICD-10-CM

## 2016-11-26 DIAGNOSIS — O3680X0 Pregnancy with inconclusive fetal viability, not applicable or unspecified: Secondary | ICD-10-CM

## 2016-11-26 DIAGNOSIS — Z3A09 9 weeks gestation of pregnancy: Secondary | ICD-10-CM | POA: Diagnosis not present

## 2016-11-26 DIAGNOSIS — B9689 Other specified bacterial agents as the cause of diseases classified elsewhere: Secondary | ICD-10-CM | POA: Insufficient documentation

## 2016-11-26 DIAGNOSIS — O26891 Other specified pregnancy related conditions, first trimester: Secondary | ICD-10-CM | POA: Diagnosis present

## 2016-11-26 DIAGNOSIS — O2690 Pregnancy related conditions, unspecified, unspecified trimester: Secondary | ICD-10-CM | POA: Insufficient documentation

## 2016-11-26 DIAGNOSIS — O0991 Supervision of high risk pregnancy, unspecified, first trimester: Secondary | ICD-10-CM | POA: Insufficient documentation

## 2016-11-26 DIAGNOSIS — N760 Acute vaginitis: Secondary | ICD-10-CM | POA: Insufficient documentation

## 2016-11-26 DIAGNOSIS — K219 Gastro-esophageal reflux disease without esophagitis: Secondary | ICD-10-CM | POA: Insufficient documentation

## 2016-11-26 DIAGNOSIS — O98811 Other maternal infectious and parasitic diseases complicating pregnancy, first trimester: Secondary | ICD-10-CM | POA: Diagnosis not present

## 2016-11-26 DIAGNOSIS — O021 Missed abortion: Secondary | ICD-10-CM | POA: Diagnosis not present

## 2016-11-26 DIAGNOSIS — O99210 Obesity complicating pregnancy, unspecified trimester: Secondary | ICD-10-CM | POA: Insufficient documentation

## 2016-11-26 LAB — CBC
HCT: 32.6 % — ABNORMAL LOW (ref 36.0–46.0)
Hemoglobin: 11.1 g/dL — ABNORMAL LOW (ref 12.0–15.0)
MCH: 26.7 pg (ref 26.0–34.0)
MCHC: 34 g/dL (ref 30.0–36.0)
MCV: 78.4 fL (ref 78.0–100.0)
PLATELETS: 278 10*3/uL (ref 150–400)
RBC: 4.16 MIL/uL (ref 3.87–5.11)
RDW: 14.7 % (ref 11.5–15.5)
WBC: 9.4 10*3/uL (ref 4.0–10.5)

## 2016-11-26 LAB — POCT URINALYSIS DIP (DEVICE)
Bilirubin Urine: NEGATIVE
Bilirubin Urine: NEGATIVE
GLUCOSE, UA: NEGATIVE mg/dL
GLUCOSE, UA: NEGATIVE mg/dL
HGB URINE DIPSTICK: NEGATIVE
Hgb urine dipstick: NEGATIVE
NITRITE: NEGATIVE
Nitrite: NEGATIVE
PH: 5.5 (ref 5.0–8.0)
PROTEIN: NEGATIVE mg/dL
PROTEIN: NEGATIVE mg/dL
Specific Gravity, Urine: >=1.03 (ref 1.005–1.030)
UROBILINOGEN UA: 0.2 mg/dL (ref 0.0–1.0)
UROBILINOGEN UA: 1 mg/dL (ref 0.0–1.0)
pH: 5.5 (ref 5.0–8.0)

## 2016-11-26 MED ORDER — PANTOPRAZOLE SODIUM 40 MG PO TBEC: 40.0000 mg | DELAYED_RELEASE_TABLET | Freq: Every day | ORAL | 3 refills | Status: DC

## 2016-11-26 NOTE — MAU Provider Note (Signed)
History     CSN: [id number]  Arrival date and time: 11/26/16 1714   None     Chief Complaint  Patient presents with  . no FH on [place]   HPI 31 yo G2P1001 at [redacted]w[redacted]d presenting today for evaluation of pregnancy status. Patient was seen earlier today for initial prenatal care. Fetal ultrasound was performed and viability could not be confirmed due to body habitus. Patient had an ultrasound in July confirming a viable pregnancy. Patient denies any vaginal bleeding or cramping pain. She was scheduled for outpatient ultrasound tomorrow but was to anxious to wait until then.  OB History    Gravida Para Term Preterm AB Living   2 1 1     1    SAB TAB Ectopic Multiple Live Births           1      Past Medical History:  Diagnosis Date  . Anemia    CHRONIC  . Pneumonia    x 1    Past Surgical History:  Procedure Laterality Date  . WISDOM TOOTH EXTRACTION      Family History  Problem Relation Age of Onset  . Epilepsy Mother   . Asthma Mother   . Cancer Paternal Grandmother        BREAST  . Cancer Maternal Aunt 52       BREAST  . Hypertension Maternal Grandmother     Social History  Substance Use Topics  . Smoking status: Never Smoker  . Smokeless tobacco: Never Used  . Alcohol use No    Allergies: No Known Allergies  Prescriptions Prior to Admission  Medication Sig Dispense Refill Last Dose  . pantoprazole (PROTONIX) 40 MG tablet Take 1 tablet (40 mg total) by mouth daily. 60 tablet 3   . Prenatal Vit-Fe Fumarate-FA (PRENATAL MULTIVITAMIN) TABS tablet Take 1 tablet by mouth daily at 12 noon.   Taking  . promethazine (PHENERGAN) 12.5 MG tablet Take 1 tablet (12.5 mg total) by mouth every 6 (six) hours as needed for nausea or vomiting. (Patient not taking: Reported on 11/26/2016) 30 tablet 0 Not Taking    Review of Systems  See pertinent in HPI Physical Exam   Last menstrual period 07/13/2016.  Physical Exam GENERAL: Well-developed, well-nourished female in no  acute distress.  ABDOMEN: Soft, nontender, obese PELVIC: Not performed EXTREMITIES: No cyanosis, clubbing, or edema, 2+ distal pulses.  MAU Course  Procedures  MDM US Ob Transvaginal  Result Date: 11/26/2016 CLINICAL DATA:  No fetal heart tones in doctor's office. Pregnancy with uncertain viability. EXAM: TRANSVAGINAL OB ULTRASOUND TECHNIQUE: Transvaginal ultrasound was performed for complete evaluation of the gestation as well as the maternal uterus, adnexal regions, and pelvic cul-de-sac. COMPARISON:  October 31, 2016 FINDINGS: Intrauterine gestational sac: Single Yolk sac:  Not Visualized. Embryo:  Visualized. Cardiac Activity: Not Visualized. Heart Rate:  bpm MSD:   mm    w     d CRL:   28.7  mm   9 w 4 d                  [place] EDC: June 27, 2017 Subchorionic hemorrhage:  None visualized. Maternal uterus/adnexae: A uterine fibroid measuring 3.1 cm is identified posteriorly. The ovaries were not visualized. No fluid in the pelvis. IMPRESSION: 1. A single intrauterine pregnancy is identified. However, no fetal heart tones are identified, consistent with fetal demise. 2. A uterine fibroid measuring 3.1 cm is identified. Electronically Signed   By: Dorise Bullion  III M.D   On: 11/26/2016 18:41     Assessment and Plan  31 yo G2P1 with a 9 week missed abortion. - Reviewed ultrasound results with the patient - Emotional support provided - Discussed expectant management, medical management with cytotec or surgical management with D&E. Patient is not able to make a decision at this time but will call with her plan of care. Advised patient to touch base with [place] in 2 weeks if she chooses expectant management - cbc ordered prior to patient discharge - Precautions reviewed  Tara Bradshaw 11/26/2016, 7:03 PM

## 2016-11-26 NOTE — Progress Notes (Signed)
Pt informed that the ultrasound is considered a limited OB ultrasound and is not intended to be a complete ultrasound exam.  Patient also informed that the ultrasound is not being completed with the intent of assessing for fetal or placental anomalies or any pelvic abnormalities.  Explained that the purpose of today's ultrasound is to assess for viability.  Patient acknowledges the purpose of the exam and the limitations of the study.    Single IUP  Fetal pole visualized Cardiac activity not observed Dr. Ilda Basset in to scan pt

## 2016-11-26 NOTE — MAU Note (Signed)
Urine in lab 

## 2016-11-26 NOTE — Discharge Instructions (Signed)

## 2016-11-26 NOTE — Telephone Encounter (Signed)
Called patient to inform her that her U/S appointment scheduled for Wednesday 8/22 at 2:15 has been changed to Tuesday 8/21 at 2:30 at [facility]. Patient verbalized understanding.

## 2016-11-26 NOTE — Progress Notes (Signed)
New OB Note  11/26/2016   Clinic: Center for [facility] [place]  Chief Complaint: NOB  Transfer of Care Patient: no  History of Present Illness: Ms. Leonides Schanz is a 31 y.o. G2P1001 @ 11/4 weeks ([place] 3/7, based on 7wk u/s) Patient's last menstrual period was 07/13/2016.  Preg complicated by has IUGR (intrauterine growth restriction); Obesity, Class III, BMI 40-49.9 (morbid obesity) ([place]); Obesity in pregnancy; GERD (gastroesophageal reflux disease); unknown pregnancy viability; and Supervision of high risk pregnancy, antepartum, first trimester on her problem list.   Any events prior to today's visit: no Her periods were: irregular She was using no method when she conceived.  She has Negative signs or symptoms of nausea/vomiting of pregnancy. She has Negative signs or symptoms of miscarriage or preterm labor On any different medications around the time she conceived/early pregnancy: Yes, on zantac     ROS: A 12-point review of systems was performed and negative, except as stated in the above HPI.  OBGYN History: As per HPI. OB History  Gravida Para Term Preterm AB Living  2 1 1     1   SAB TAB Ectopic Multiple Live Births          1    # Outcome Date GA Lbr Len/2nd Weight Sex Delivery Anes PTL Lv  2 Current           1 Term 10/14/12 [redacted]w[redacted]d 13:31 / 01:23 6 lb 7.2 oz (2.926 kg) M Vag-Spont EPI  LIV     Any issues with any prior pregnancies: 39wk IOL for lagging [facility]. Newborn was borderline SGA Prior children are healthy, doing well, and without any problems or issues: yes History of pap smears: Yes. Last pap smear 2012 and results were negative   Past Medical History: Past Medical History:  Diagnosis Date  . Anemia    CHRONIC  . Pneumonia    x 1    Past Surgical History: Past Surgical History:  Procedure Laterality Date  . WISDOM TOOTH EXTRACTION      Family History:  Family History  Problem Relation Age of Onset  . Epilepsy Mother   . Asthma Mother   . Cancer  Paternal Grandmother        BREAST  . Cancer Maternal Aunt 52       BREAST  . Hypertension Maternal Grandmother    She denies any history of mental retardation, birth defects or genetic disorders in her or the FOB's history  Social History:  Social History   Social History  . Marital status: Single    Spouse name: N/A  . Number of children: N/A  . Years of education:  58   Occupational History  . SECURITY OFFICER Civil engineer, contracting   Social History Main Topics  . Smoking status: Never Smoker  . Smokeless tobacco: Never Used  . Alcohol use No  . Drug use: No  . Sexual activity: Yes    Partners: Male    Birth control/ protection: None   Other Topics Concern  . Not on file   Social History Narrative  . 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 Contractions: Not present Vag. Bleeding: None. Fundal height: not applicable FHTs: none  General appearance: Well nourished, well developed female in no acute distress.  Neck:  Supple, normal appearance, and no thyromegaly  Cardiovascular: S1, S2 normal, no murmur, rub or gallop, regular rate and rhythm Respiratory:  Clear to auscultation bilateral. Normal respiratory effort Abdomen: positive bowel sounds and no masses, hernias; diffusely non tender to palpation, non distended Breasts: not examined. Pt denies any breast s/s Neuro/Psych:  Normal mood and affect.  Skin:  Warm and dry.  Lymphatic:  No inguinal lymphadenopathy.   Pelvic exam: is limited by body habitus EGBUS: within normal limits, Vagina: within normal limits and with no blood in the vault, Cervix: normal appearing cervix without discharge or  lesions, closed/long/high, Uterus:  nonenlarged, and Adnexa:  normal adnexa and no mass, fullness, tenderness  Laboratory: none  Imaging:  Bedside u/s: transabdominal u/s done. Bladder empty. Fluid appears low and fetus seen but no movement and no cardiac motion. Difficult due to body habitus   10/31/2016 CLINICAL DATA:  31 year old female reportedly 15 weeks 5 days pregnant (bite last menstrual. ) with lower midline abdominal pain  EXAM: OBSTETRIC <14 WK [place] AND TRANSVAGINAL OB US  TECHNIQUE: Both transabdominal and transvaginal ultrasound examinations were performed for complete evaluation of the gestation as well as the maternal uterus, adnexal regions, and pelvic cul-de-sac. Transvaginal technique was performed to assess early pregnancy.  COMPARISON:  None.  FINDINGS: Intrauterine gestational sac: Single  Yolk sac:  Visualized.  Embryo:  Visualized.  Cardiac Activity: Visualized.  Heart Rate: 154  bpm  CRL:  15  Mm   7 w   6 d                  [place] EDC: June 13, 2017  Subchorionic hemorrhage:  Small  Maternal uterus/adnexae: Normal appearance of the ovaries. Heterogeneous soft tissue nodule in the posterior aspect of the right uterus measures 2.6 cm and is most consistent with a sternal uterine fibroid.  IMPRESSION: 1. Single live intrauterine pregnancy. By crown-rump length, the estimated gestational age is 7 weeks 6 days and the estimated date of confinement is June 13, 2017. 2. The fetal heart rate is 154 beats per minute. 3. Small subchorionic hemorrhage. 4. Incidental note is made of a 2.6 cm uterine fibroid along the right posterior uterine wall.   Electronically Signed   By: Jacqulynn Cadet M.D.   On: 10/31/2016 18:38  Assessment: unknown pregnancy viability. Pt stable  Plan: 1. Encounter to determine fetal viability of pregnancy, single or unspecified fetus Pt went to MAU on 8/14 for back pain. Bedside u/s showed SLIUP. Earliest  u/s was for [facility] tomorrow.  - US OB Limited - US OB Transvaginal; Future - US OB Comp Less 14 Wks; Future  2. Supervision of high risk pregnancy, antepartum, first trimester Needs NOB labs (declines genetics) if viable pregnancy  - Cytology - PAP  3. Gastroesophageal reflux disease without esophagitis Switched to protonix  4. BMI 40s Early GDM and baseline pre-x labs if viable pregnancy.   Problem list reviewed and updated.  >50% of 30 min visit spent on counseling and coordination of care.     Durene Romans MD Attending Center for [place] [facility])

## 2016-11-27 ENCOUNTER — Telehealth: Payer: Self-pay | Admitting: *Deleted

## 2016-11-27 ENCOUNTER — Telehealth (HOSPITAL_COMMUNITY): Payer: Self-pay

## 2016-11-27 DIAGNOSIS — O021 Missed abortion: Secondary | ICD-10-CM | POA: Diagnosis present

## 2016-11-27 NOTE — Telephone Encounter (Signed)
-----   Message from [place], RN sent at 11/27/2016  2:49 PM EDT ----- Regarding: Schedule D&E Pt needs to be scheduled for D&E due to non-viable pregnancy. She was seen in clinic and MAU on 8/20 and given this option by Dr. Elly Modena. Pt wants procedure as soon as possible by ANY provider - I have reviewed her case with Dr. Ilda Basset today and he agrees. Blood type O+. Her address and phone # are current and correct.  Thank you!

## 2016-11-27 NOTE — Telephone Encounter (Signed)
Called and spoke with Tara Bradshaw, given surgery date and time, NPO after midnight, no lotions, perfumes, or powder, remove all jewelry, patient expressed understanding, advised to call us back if she had any questions.

## 2016-11-27 NOTE — H&P (Signed)
  Tara Bradshaw is an 30 y.o. G94P1001 female.   Chief Complaint: Missed AB HPI: Found to have missed ab at 9 wk 4/7 days by CRL.  Past Medical History:  Diagnosis Date  . Anemia    CHRONIC  . Pneumonia    x 1    Past Surgical History:  Procedure Laterality Date  . WISDOM TOOTH EXTRACTION      Family History  Problem Relation Age of Onset  . Epilepsy Mother   . Asthma Mother   . Cancer Paternal Grandmother        BREAST  . Cancer Maternal Aunt 52       BREAST  . Hypertension Maternal Grandmother    Social History:  reports that she has never smoked. She has never used smokeless tobacco. She reports that she does not drink alcohol or use drugs.  Allergies: No Known Allergies  No current facility-administered medications on file prior to encounter.    Current Outpatient Prescriptions on File Prior to Encounter  Medication Sig Dispense Refill  . Prenatal Vit-Fe Fumarate-FA (PRENATAL MULTIVITAMIN) TABS tablet Take 1 tablet by mouth daily at 12 noon.      A comprehensive review of systems was negative.  Last menstrual period 07/13/2016. General appearance: alert, cooperative and appears stated age Head: Normocephalic, without obvious abnormality, atraumatic Neck: supple, symmetrical, trachea midline Lungs: normal effort Heart: regular rate and rhythm Abdomen: soft, non-tender; bowel sounds normal; no masses,  no organomegaly Extremities: extremities normal, atraumatic, no cyanosis or edema Skin: Skin color, texture, turgor normal. No rashes or lesions Neurologic: Grossly normal   Lab Results  Component Value Date   WBC 9.4 11/26/2016   HGB 11.1 (L) 11/26/2016   HCT 32.6 (L) 11/26/2016   MCV 78.4 11/26/2016   PLT 278 11/26/2016   O POS   Assessment/Plan Principal Problem:   Missed abortion  For suction D and C. Risks include but are not limited to bleeding, infection, injury to surrounding structures, including bowel, bladder and ureters, blood clots,  and death.  Likelihood of success is high.    Donnamae Jude 11/27/2016, 9:50 PM

## 2016-11-27 NOTE — Telephone Encounter (Signed)
Pt called and spoke w/Antoinette Clinton - Museum/gallery curator. She stated that she had decided to have the surgical procedure as explained by Dr. Elly Modena last night. I reviewed pt's clinical history with Dr. Ilda Basset, received recommendation for plan of care and then spoke w/pt. I expressed condolences for her pregnancy loss. I advised pt that I will notify the appropriate person to schedule her procedure (D&E) and she will be contacted with the necessary information. Pt denies having heavy vaginal bleeding or abdominal pain. She was advised to go to MAU immediately if these sx develop and she voiced understanding. She stated that she wants the procedure ASAP and does not have a preference for provider.

## 2016-11-28 ENCOUNTER — Ambulatory Visit (HOSPITAL_COMMUNITY): Payer: Medicaid Other

## 2016-11-28 ENCOUNTER — Encounter (HOSPITAL_COMMUNITY): Payer: Self-pay | Admitting: *Deleted

## 2016-11-28 LAB — CYTOLOGY - PAP
Bacterial vaginitis: POSITIVE — AB
CANDIDA VAGINITIS: NEGATIVE
Chlamydia: NEGATIVE
Diagnosis: NEGATIVE
HPV: NOT DETECTED
Neisseria Gonorrhea: NEGATIVE
TRICH (WINDOWPATH): NEGATIVE

## 2016-11-30 ENCOUNTER — Ambulatory Visit (HOSPITAL_COMMUNITY): Payer: Medicaid Other | Admitting: Anesthesiology

## 2016-11-30 ENCOUNTER — Encounter (HOSPITAL_COMMUNITY)
Admission: RE | Disposition: A | Discharge status: Home / Self Care | Payer: Self-pay | Source: Ambulatory Visit | Attending: Family Medicine

## 2016-11-30 ENCOUNTER — Encounter (HOSPITAL_COMMUNITY): Payer: Self-pay | Admitting: *Deleted

## 2016-11-30 ENCOUNTER — Ambulatory Visit (HOSPITAL_COMMUNITY)
Admission: RE | Admit: 2016-11-30 | Discharge: 2016-11-30 | Disposition: A | Discharge status: Home / Self Care | Payer: Medicaid Other | Source: Ambulatory Visit | Attending: Family Medicine | Admitting: Family Medicine

## 2016-11-30 DIAGNOSIS — O021 Missed abortion: Secondary | ICD-10-CM | POA: Diagnosis present

## 2016-11-30 HISTORY — DX: Gastro-esophageal reflux disease without esophagitis: K21.9

## 2016-11-30 HISTORY — PX: DILATION AND EVACUATION: SHX1459

## 2016-11-30 SURGERY — DILATION AND EVACUATION, UTERUS
Anesthesia: Monitor Anesthesia Care | Site: Vagina

## 2016-11-30 MED ORDER — LIDOCAINE HCL (CARDIAC) 20 MG/ML IV SOLN
INTRAVENOUS | Status: AC
  Filled 2016-11-30: qty 5

## 2016-11-30 MED ORDER — ONDANSETRON HCL 4 MG/2ML IJ SOLN
INTRAMUSCULAR | Status: AC
  Filled 2016-11-30: qty 2

## 2016-11-30 MED ORDER — BUPIVACAINE HCL (PF) 0.25 % IJ SOLN
INTRAMUSCULAR | Status: AC
  Filled 2016-11-30: qty 30

## 2016-11-30 MED ORDER — MIDAZOLAM HCL 2 MG/2ML IJ SOLN
INTRAMUSCULAR | Status: DC | PRN
  Administered 2016-11-30: 2 mg via INTRAVENOUS

## 2016-11-30 MED ORDER — LACTATED RINGERS IV SOLN
INTRAVENOUS | Status: DC
  Administered 2016-11-30: 15:00:00 via INTRAVENOUS

## 2016-11-30 MED ORDER — PROPOFOL 10 MG/ML IV BOLUS
INTRAVENOUS | Status: AC
  Filled 2016-11-30: qty 20

## 2016-11-30 MED ORDER — DOXYCYCLINE HYCLATE 100 MG IV SOLR
100.0000 mg | INTRAVENOUS | Status: AC
  Administered 2016-11-30: 100 mg via INTRAVENOUS
  Filled 2016-11-30 (×3): qty 100

## 2016-11-30 MED ORDER — PROMETHAZINE HCL 25 MG/ML IJ SOLN: 6.2500 mg | INTRAMUSCULAR | Status: DC | PRN

## 2016-11-30 MED ORDER — MIDAZOLAM HCL 2 MG/2ML IJ SOLN
INTRAMUSCULAR | Status: AC
  Filled 2016-11-30: qty 2

## 2016-11-30 MED ORDER — FENTANYL CITRATE (PF) 100 MCG/2ML IJ SOLN
INTRAMUSCULAR | Status: DC | PRN
  Administered 2016-11-30: 100 ug via INTRAVENOUS
  Administered 2016-11-30: 50 ug via INTRAVENOUS

## 2016-11-30 MED ORDER — DEXAMETHASONE SODIUM PHOSPHATE 10 MG/ML IJ SOLN
INTRAMUSCULAR | Status: DC | PRN
  Administered 2016-11-30: 10 mg via INTRAVENOUS

## 2016-11-30 MED ORDER — LACTATED RINGERS IV SOLN
INTRAVENOUS | Status: DC
  Administered 2016-11-30: 1000 mL via INTRAVENOUS

## 2016-11-30 MED ORDER — KETOROLAC TROMETHAMINE 30 MG/ML IJ SOLN
INTRAMUSCULAR | Status: AC
  Filled 2016-11-30: qty 1

## 2016-11-30 MED ORDER — DEXAMETHASONE SODIUM PHOSPHATE 10 MG/ML IJ SOLN
INTRAMUSCULAR | Status: AC
  Filled 2016-11-30: qty 1

## 2016-11-30 MED ORDER — HYDROCODONE-ACETAMINOPHEN 7.5-325 MG PO TABS: 1.0000 | ORAL_TABLET | Freq: Once | ORAL | Status: DC | PRN

## 2016-11-30 MED ORDER — ONDANSETRON HCL 4 MG/2ML IJ SOLN
INTRAMUSCULAR | Status: DC | PRN
  Administered 2016-11-30: 4 mg via INTRAVENOUS

## 2016-11-30 MED ORDER — PROPOFOL 10 MG/ML IV BOLUS
INTRAVENOUS | Status: DC | PRN
  Administered 2016-11-30 (×3): 30 mg via INTRAVENOUS
  Administered 2016-11-30 (×2): 20 mg via INTRAVENOUS
  Administered 2016-11-30: 50 mg via INTRAVENOUS
  Administered 2016-11-30: 30 mg via INTRAVENOUS

## 2016-11-30 MED ORDER — FENTANYL CITRATE (PF) 250 MCG/5ML IJ SOLN
INTRAMUSCULAR | Status: AC
  Filled 2016-11-30: qty 5

## 2016-11-30 MED ORDER — LIDOCAINE HCL (CARDIAC) 20 MG/ML IV SOLN
INTRAVENOUS | Status: DC | PRN
  Administered 2016-11-30: 80 mg via INTRAVENOUS

## 2016-11-30 MED ORDER — SCOPOLAMINE 1 MG/3DAYS TD PT72
MEDICATED_PATCH | TRANSDERMAL | Status: AC
  Filled 2016-11-30: qty 1

## 2016-11-30 MED ORDER — FENTANYL CITRATE (PF) 100 MCG/2ML IJ SOLN: 25.0000 ug | INTRAMUSCULAR | Status: DC | PRN

## 2016-11-30 MED ORDER — KETOROLAC TROMETHAMINE 30 MG/ML IJ SOLN: 30.0000 mg | Freq: Once | INTRAMUSCULAR | Status: DC | PRN

## 2016-11-30 MED ORDER — SCOPOLAMINE 1 MG/3DAYS TD PT72
1.0000 | MEDICATED_PATCH | Freq: Once | TRANSDERMAL | Status: DC
  Administered 2016-11-30: 1.5 mg via TRANSDERMAL

## 2016-11-30 MED ORDER — BUPIVACAINE-EPINEPHRINE 0.25% -1:200000 IJ SOLN
INTRAMUSCULAR | Status: DC | PRN
  Administered 2016-11-30: 20 mL

## 2016-11-30 MED ORDER — IBUPROFEN 600 MG PO TABS: 600.0000 mg | ORAL_TABLET | Freq: Four times a day (QID) | ORAL | 1 refills | Status: DC | PRN

## 2016-11-30 MED ORDER — KETOROLAC TROMETHAMINE 30 MG/ML IJ SOLN
INTRAMUSCULAR | Status: DC | PRN
  Administered 2016-11-30: 30 mg via INTRAVENOUS

## 2016-11-30 SURGICAL SUPPLY — 19 items
CATH ROBINSON RED A/P 16FR (CATHETERS) ×2 IMPLANT
CLOTH BEACON ORANGE TIMEOUT ST (SAFETY) ×2 IMPLANT
DECANTER SPIKE VIAL GLASS SM (MISCELLANEOUS) ×2 IMPLANT
GLOVE BIOGEL PI IND STRL 7.0 (GLOVE) ×2 IMPLANT
GLOVE BIOGEL PI INDICATOR 7.0 (GLOVE) ×2
GLOVE ECLIPSE 7.0 STRL STRAW (GLOVE) ×4 IMPLANT
GOWN STRL REUS W/TWL LRG LVL3 (GOWN DISPOSABLE) ×4 IMPLANT
KIT BERKELEY 1ST TRIMESTER 3/8 (MISCELLANEOUS) ×2 IMPLANT
NS IRRIG 1000ML POUR BTL (IV SOLUTION) ×2 IMPLANT
PACK VAGINAL MINOR WOMEN LF (CUSTOM PROCEDURE TRAY) ×2 IMPLANT
PAD OB MATERNITY 4.3X12.25 (PERSONAL CARE ITEMS) ×2 IMPLANT
PAD PREP 24X48 CUFFED NSTRL (MISCELLANEOUS) ×2 IMPLANT
SET BERKELEY SUCTION TUBING (SUCTIONS) ×2 IMPLANT
TOWEL OR 17X24 6PK STRL BLUE (TOWEL DISPOSABLE) ×4 IMPLANT
VACURETTE 10 RIGID CVD (CANNULA) IMPLANT
VACURETTE 12 RIGID CVD (CANNULA) ×1 IMPLANT
VACURETTE 7MM CVD STRL WRAP (CANNULA) IMPLANT
VACURETTE 8 RIGID CVD (CANNULA) IMPLANT
VACURETTE 9 RIGID CVD (CANNULA) IMPLANT

## 2016-11-30 NOTE — Anesthesia Postprocedure Evaluation (Signed)
Anesthesia Post Note  Patient: Tara Bradshaw  Procedure(s) Performed: Procedure(s) (LRB): DILATATION AND EVACUATION (N/A)     Patient location during evaluation: PACU Anesthesia Type: MAC Level of consciousness: awake and alert Pain management: pain level controlled Vital Signs Assessment: post-procedure vital signs reviewed and stable Respiratory status: spontaneous breathing, nonlabored ventilation, respiratory function stable and patient connected to nasal cannula oxygen Cardiovascular status: stable and blood pressure returned to baseline Anesthetic complications: no    Last Vitals:  Vitals:   11/30/16 1500 11/30/16 1515  BP:  108/62  Pulse:  80  Resp: 20 18  Temp:    SpO2:  100%    Last Pain:  Vitals:   11/30/16 1515  TempSrc:   PainSc: 0-No pain   Pain Goal: Patients Stated Pain Goal: 4 (11/30/16 1515)               Tiajuana Amass

## 2016-11-30 NOTE — Discharge Instructions (Signed)
Dilation and Curettage or Vacuum Curettage, Care After These instructions give you information about caring for yourself after your procedure. Your doctor may also give you more specific instructions. Call your doctor if you have any problems or questions after your procedure. Follow these instructions at home: Activity  Do not drive or use heavy machinery while taking prescription pain medicine.  For 24 hours after your procedure, avoid driving.  Take short walks often, followed by rest periods. Ask your doctor what activities are safe for you. After one or two days, you may be able to return to your normal activities.  Do not lift anything that is heavier than 10 lb (4.5 kg) until your doctor approves.  For at least 2 weeks, or as long as told by your doctor: ? Do not douche. ? Do not use tampons. ? Do not have sex. General instructions  Take over-the-counter and prescription medicines only as told by your doctor. This is very important if you take blood thinning medicine.  Do not take baths, swim, or use a hot tub until your doctor approves. Take showers instead of baths.  Wear compression stockings as told by your doctor.  It is up to you to get the results of your procedure. Ask your doctor when your results will be ready.  Keep all follow-up visits as told by your doctor. This is important. Contact a doctor if:  You have very bad cramps that get worse or do not get better with medicine.  You have very bad pain in your belly (abdomen).  You cannot drink fluids without throwing up (vomiting).  You get pain in a different part of the area between your belly and thighs (pelvis).  You have bad-smelling discharge from your vagina.  You have a rash. Get help right away if:  You are bleeding a lot from your vagina. A lot of bleeding means soaking more than one sanitary pad in an hour, for 2 hours in a row.  You have clumps of blood (blood clots) coming from your  vagina.  You have a fever or chills.  Your belly feels very tender or hard.  You have chest pain.  You have trouble breathing.  You cough up blood.  You feel dizzy.  You feel light-headed.  You pass out (faint).  You have pain in your neck or shoulder area. Summary  Take short walks often, followed by rest periods. Ask your doctor what activities are safe for you. After one or two days, you may be able to return to your normal activities.  Do not lift anything that is heavier than 10 lb (4.5 kg) until your doctor approves.  Do not take baths, swim, or use a hot tub until your doctor approves. Take showers instead of baths.  Contact your doctor if you have any symptoms of infection, like bad-smelling discharge from your vagina.   Post Anesthesia Home Care Instructions  Activity: Get plenty of rest for the remainder of the day. A responsible individual must stay with you for 24 hours following the procedure.  For the next 24 hours, DO NOT: -Drive a car -Paediatric nurse -Drink alcoholic beverages -Take any medication unless instructed by your physician -Make any legal decisions or sign important papers.  Meals: Start with liquid foods such as gelatin or soup. Progress to regular foods as tolerated. Avoid greasy, spicy, heavy foods. If nausea and/or vomiting occur, drink only clear liquids until the nausea and/or vomiting subsides. Call your physician if vomiting continues.  Special Instructions/Symptoms: Your throat may feel dry or sore from the anesthesia or the breathing tube placed in your throat during surgery. If this causes discomfort, gargle with warm salt water. The discomfort should disappear within 24 hours.  If you had a scopolamine patch placed behind your ear for the management of post- operative nausea and/or vomiting:  1. The medication in the patch is effective for 72 hours, after which it should be removed.  Wrap patch in a tissue and discard in the  trash. Wash hands thoroughly with soap and water. 2. You may remove the patch earlier than 72 hours if you experience unpleasant side effects which may include dry mouth, dizziness or visual disturbances. 3. Avoid touching the patch. Wash your hands with soap and water after contact with the patch.   This information is not intended to replace advice given to you by your health care provider. Make sure you discuss any questions you have with your health care provider. Document Released: 01/03/2008 Document Revised: 12/12/2015 Document Reviewed: 12/12/2015 Elsevier Interactive Patient Education  2017 [employer].

## 2016-11-30 NOTE — Interval H&P Note (Signed)
History and Physical Interval Note:  11/30/2016 12:38 PM  Tara Bradshaw  has presented today for surgery, with the diagnosis of Non Viable Pregnancy  The various methods of treatment have been discussed with the patient and family. After consideration of risks, benefits and other options for treatment, the patient has consented to  Procedure(s): DILATATION AND EVACUATION (N/A) as a surgical intervention .  The patient's history has been reviewed, patient examined, no change in status, stable for surgery.  I have reviewed the patient's chart and labs.  Questions were answered to the patient's satisfaction.     Donnamae Jude

## 2016-11-30 NOTE — Op Note (Signed)
Tara Bradshaw  PROCEDURE DATE: 11/30/2016  PREOPERATIVE DIAGNOSIS: 9 week missed abortion.  POSTOPERATIVE DIAGNOSIS: The same.  PROCEDURE:    Suction Dilation and Evacuation.  SURGEON:  Donnamae Jude  ANESTHESIA: Suzette Battiest, MD  INDICATIONS: 32 y.o. G2P1001with MAB at [redacted] weeks gestation, needing surgical completion.  Risks of surgery were discussed with the patient including but not limited to: bleeding which may require transfusion; infection which may require antibiotics; injury to uterus or surrounding organs;need for additional procedures including laparotomy or laparoscopy; possibility of intrauterine scarring which may impair future fertility; and other postoperative/anesthesia complications. Written informed consent was obtained.    FINDINGS:  A 14 wk  size anteverted uterus, moderate amounts of products of conception, specimen sent to pathology.  ANESTHESIA:    Monitored intravenous sedation, paracervical block.  ESTIMATED BLOOD LOSS:  Less than 20 ml.  SPECIMENS:  Products of conception sent to pathology  COMPLICATIONS:  None immediate.  PROCEDURE DETAILS:  The patient received intravenous antibiotics while in the preoperative area.  She was then taken to the operating room where general anesthesia was administered and was found to be adequate.  After an adequate timeout was performed, she was placed in the dorsal lithotomy position and examined; then prepped and draped in the sterile manner.   Her bladder was catheterized for an unmeasured amount of clear, yellow urine. A vaginal speculum was then placed in the patient's vagina and a single tooth tenaculum was applied to the anterior lip of the cervix.  A paracervical block using 1% Lidocaine with Epinephrine was administered. The cervix was gently dilated to accommodate a 12 mm suction curette that was gently advanced to the uterine fundus.  The suction device was then activated and curette slowly rotated to clear the  uterus of products of conception.  A sharp curettage was then performed to confirm complete emptying of the uterus.There was minimal bleeding noted and the tenaculum removed with good hemostasis noted.  All insturmnent, needle and lap counts were correct x 2.The patient tolerated the procedure well.  The patient was taken to the recovery area in stable condition.  Donnamae Jude 11/30/2016 2:50 PM

## 2016-11-30 NOTE — Transfer of Care (Signed)
Immediate Anesthesia Transfer of Care Note  Patient: Tara Bradshaw  Procedure(s) Performed: Procedure(s): DILATATION AND EVACUATION (N/A)  Patient Location: PACU  Anesthesia Type:MAC  Level of Consciousness: awake, alert  and oriented  Airway & Oxygen Therapy: Patient Spontanous Breathing and Patient connected to nasal cannula oxygen  Post-op Assessment: Report given to RN, Post -op Vital signs reviewed and stable and Patient moving all extremities  Post vital signs: Reviewed and stable  Last Vitals:  Vitals:   11/30/16 1033  BP: 126/83  Pulse: 79  Resp: 16  Temp: 36.9 C  SpO2: 100%    Last Pain:  Vitals:   11/30/16 1033  TempSrc: Oral  PainSc: 4       Patients Stated Pain Goal: 4 (89/21/19 4174)  Complications: No apparent anesthesia complications

## 2016-11-30 NOTE — Anesthesia Preprocedure Evaluation (Addendum)
Anesthesia Evaluation  Patient identified by MRN, date of birth, ID band Patient awake    Reviewed: Allergy & Precautions, NPO status , Patient's Chart, lab work & pertinent test results  Airway Mallampati: II  TM Distance: >3 FB Neck ROM: Full    Dental   Pulmonary neg pulmonary ROS,    breath sounds clear to auscultation       Cardiovascular negative cardio ROS   Rhythm:Regular Rate:Normal     Neuro/Psych negative neurological ROS     GI/Hepatic Neg liver ROS, GERD  ,  Endo/Other  negative endocrine ROS  Renal/GU negative Renal ROS     Musculoskeletal   Abdominal (+) + obese,   Peds  Hematology  (+) anemia ,   Anesthesia Other Findings   Reproductive/Obstetrics (+) Pregnancy                            Lab Results  Component Value Date   WBC 9.4 11/26/2016   HGB 11.1 (L) 11/26/2016   HCT 32.6 (L) 11/26/2016   MCV 78.4 11/26/2016   PLT 278 11/26/2016   Lab Results  Component Value Date   CREATININE 0.49 (L) 10/16/2012   BUN 3 (L) 10/16/2012   NA 137 10/16/2012   K 3.4 (L) 10/16/2012   CL 103 10/16/2012   CO2 25 10/16/2012    Anesthesia Physical Anesthesia Plan  ASA: III  Anesthesia Plan: MAC   Post-op Pain Management:    Induction: Intravenous  PONV Risk Score and Plan: 3 and Ondansetron, Dexamethasone, Midazolam, Propofol infusion and Treatment may vary due to age or medical condition  Airway Management Planned: Natural Airway and Simple Face Mask  Additional Equipment:   Intra-op Plan:   Post-operative Plan:   Informed Consent: I have reviewed the patients History and Physical, chart, labs and discussed the procedure including the risks, benefits and alternatives for the proposed anesthesia with the patient or authorized representative who has indicated his/her understanding and acceptance.     Plan Discussed with: CRNA  Anesthesia Plan Comments:         Anesthesia Quick Evaluation

## 2016-12-01 ENCOUNTER — Encounter (HOSPITAL_COMMUNITY): Payer: Self-pay | Admitting: Family Medicine

## 2016-12-03 ENCOUNTER — Telehealth: Payer: Self-pay | Admitting: General Practice

## 2016-12-03 NOTE — Telephone Encounter (Signed)
Called and notified patient of post-op appointment with Dr. Kennon Rounds on 12/19/16 at 2:40pm.  Patient voiced understanding.

## 2016-12-04 ENCOUNTER — Telehealth: Payer: Self-pay | Admitting: General Practice

## 2016-12-04 NOTE — Telephone Encounter (Signed)
Hi:   This patient had surgery on 11/30/16. She returned back to work today, 12/03/16. She wants to know if it was ok to return back to work. Patient says she didn't see in her paperwork where she couldn't return back to work.    Thank you

## 2016-12-04 NOTE — Telephone Encounter (Signed)
-----   Message from [place], RN sent at 12/04/2016 12:01 PM EDT ----- Contact: [phone] A telephone note containing this information needs to be started and then route to the clinical pool so that we can call her back. Pt had a legitimate question regarding plan of care and it needs to be documented. Thanks  ----- Message ----- From: Maurine Minister, NT Sent: 12/03/2016  11:01 AM To: [facility] Clinical Pool  Hi:  This patient had surgery on 11/30/16.  She returned back to work today, 12/03/16.  She wants to know if it was ok to return back to work. Patient says she didn't see in her paperwork where she couldn't return back to work.   Thank you

## 2016-12-05 ENCOUNTER — Telehealth: Payer: Self-pay | Admitting: General Practice

## 2016-12-05 NOTE — Telephone Encounter (Signed)
-----   Message from Maurine Minister, [place] sent at 12/03/2016 11:01 AM EDT ----- Contact: [phone] Hi:  This patient had surgery on 11/30/16.  She returned back to work today, 12/03/16.  She wants to know if it was ok to return back to work. Patient says she didn't see in her paperwork where she couldn't return back to work.   Thank you

## 2016-12-05 NOTE — Telephone Encounter (Signed)
Patient called and left message on nurse line stating she had a D&E on Friday and returned to work today, Monday 8/27. Patient states she is having swelling today and more bleeding with cramps. Patient states she would like to be out of work until her follow up appt and her job isn't wanting her to work either. Spoke with Dr Kennon Rounds who states we can accommodate letter for patient. Called and discussed with patient & that she may pick up letter at front office at her convenience. Patient verbalized understanding & had no questions

## 2016-12-06 ENCOUNTER — Telehealth: Payer: Self-pay

## 2016-12-06 ENCOUNTER — Ambulatory Visit: Payer: Medicaid Other | Admitting: Obstetrics & Gynecology

## 2016-12-06 NOTE — Telephone Encounter (Signed)
Late entry spoke with patient on 12/06/2016@ 10:30am

## 2016-12-06 NOTE — Telephone Encounter (Signed)
Patient left a message on nurse line regarding some bleeding after D&E and swelling. She reported to work even after having some bleeding and swelling and was sent home. Patient stated her employer is sending her home due to her being a liability on the job and she has not been release from doctor care she should remain out of work until after follow up appointment in our office. I advised patient  she should not be bleeding heavy and swelling in her feet at this time after a D&E. I spoke with Glass blower/designer and she stated we could write her note from being absence from work since she was sent home but we would need to see her asap to determine why she is still bleeding. Patient stated she understood and would make an appointment to be seen.

## 2016-12-19 ENCOUNTER — Encounter: Payer: Self-pay | Admitting: Family Medicine

## 2016-12-19 ENCOUNTER — Ambulatory Visit (INDEPENDENT_AMBULATORY_CARE_PROVIDER_SITE_OTHER): Payer: Medicaid Other | Admitting: Family Medicine

## 2016-12-19 VITALS — BP 115/74 | HR 74 | Ht 62.0 in | Wt 254.8 lb

## 2016-12-19 DIAGNOSIS — Z09 Encounter for follow-up examination after completed treatment for conditions other than malignant neoplasm: Secondary | ICD-10-CM

## 2016-12-19 NOTE — Progress Notes (Signed)
   Subjective:    Patient ID: Tara Bradshaw is a 31 y.o. female presenting with Follow-up  on 12/19/2016  HPI: Here today to f/u her D and C. S/p procedure for missed AB at 9 wks. No further bleeding. No fever, chills, nausea, vomiting. She is unsure when she would like to try for another pregnancy.  Review of Systems  Constitutional: Negative for chills and fever.  Respiratory: Negative for shortness of breath.   Cardiovascular: Negative for chest pain.  Gastrointestinal: Negative for abdominal pain, nausea and vomiting.  Genitourinary: Negative for dysuria.  Skin: Negative for rash.      Objective:    BP 115/74   Pulse 74   Ht 5\' 2"  (1.575 m)   Wt 254 lb 12.8 oz (115.6 kg)   LMP 07/13/2016   BMI 46.60 kg/m  Physical Exam  Constitutional: She is oriented to person, place, and time. She appears well-developed and well-nourished. No distress.  HENT:  Head: Normocephalic and atraumatic.  Eyes: No scleral icterus.  Neck: Neck supple.  Cardiovascular: Normal rate.   Pulmonary/Chest: Effort normal.  Abdominal: Soft. There is no tenderness.  Neurological: She is alert and oriented to person, place, and time.  Skin: Skin is warm and dry.  Psychiatric: She has a normal mood and affect.        Assessment & Plan:  Postop check - doing well. PNV's if attempting pregnancy. Offered BHC--she did not want this at this time.   Total face-to-face time with patient: 10 minutes. Over 50% of encounter was spent on counseling and coordination of care. Return if symptoms worsen or fail to improve.  Donnamae Jude 12/19/2016 5:38 PM

## 2016-12-19 NOTE — Progress Notes (Signed)
PHQ9 was elevated but patient declined to see Behavioral Clinician.

## 2016-12-24 NOTE — Progress Notes (Signed)
Addendum:  FMLA completed.

## 2017-01-21 ENCOUNTER — Encounter (HOSPITAL_COMMUNITY): Payer: Self-pay | Admitting: Emergency Medicine

## 2017-01-21 ENCOUNTER — Ambulatory Visit (HOSPITAL_COMMUNITY)
Admission: EM | Admit: 2017-01-21 | Discharge: 2017-01-21 | Disposition: A | Discharge status: Home / Self Care | Payer: Medicaid Other | Attending: Emergency Medicine | Admitting: Emergency Medicine

## 2017-01-21 DIAGNOSIS — R0982 Postnasal drip: Secondary | ICD-10-CM | POA: Diagnosis not present

## 2017-01-21 DIAGNOSIS — J9801 Acute bronchospasm: Secondary | ICD-10-CM | POA: Diagnosis not present

## 2017-01-21 DIAGNOSIS — R059 Cough, unspecified: Secondary | ICD-10-CM

## 2017-01-21 DIAGNOSIS — R05 Cough: Secondary | ICD-10-CM

## 2017-01-21 MED ORDER — ALBUTEROL SULFATE HFA 108 (90 BASE) MCG/ACT IN AERS: 2.0000 | INHALATION_SPRAY | RESPIRATORY_TRACT | 0 refills | Status: DC | PRN

## 2017-01-21 MED ORDER — PREDNISONE 50 MG PO TABS: ORAL_TABLET | ORAL | 0 refills | Status: DC

## 2017-01-21 NOTE — ED Triage Notes (Signed)
Pt here for URI sx with cough x 5 days

## 2017-01-21 NOTE — Discharge Instructions (Signed)
Recommend taking a nonsedating antihistamine during the day such as Allegra or Zyrtec. If you need something stronger at nighttime he may take chlorpheniramine which is Chlor-Trimeton 2 or 4 mg every 4 hours.take the prednisone as directed with food. Use the albuterol inhaler 2 puffs every 4 hours as needed for cough or wheeze.

## 2017-01-21 NOTE — ED Provider Notes (Signed)
[facility]    CSN: [id number] Arrival date & time: 01/21/17  1741     History   Chief Complaint Chief Complaint  Patient presents with  . Cough  . URI    HPI Tara Bradshaw is a 31 y.o. female.   31 year old female complaining of a cough for 4-5 days associated with throat pain when she coughs as well as occasional posttussive emesis. Denies fever or chills. No history of asthma or smoking. She has taken some OTC meds without relief.      Past Medical History:  Diagnosis Date  . Anemia    CHRONIC  . GERD (gastroesophageal reflux disease)    WITH PREGNANCY  . Pneumonia    x 1    Patient Active Problem List   Diagnosis Date Noted  . Obesity, Class III, BMI 40-49.9 (morbid obesity) ([place]) 11/26/2016  . GERD (gastroesophageal reflux disease) 11/26/2016    Past Surgical History:  Procedure Laterality Date  . DILATION AND EVACUATION N/A 11/30/2016   Procedure: DILATATION AND EVACUATION;  Surgeon: Donnamae Jude, MD;  Location: [facility];  Service: Gynecology;  Laterality: N/A;  . WISDOM TOOTH EXTRACTION    . WISDOM TOOTH EXTRACTION     NO ANESTHESIA    OB History    Gravida Para Term Preterm AB Living   2 1 1     1    SAB TAB Ectopic Multiple Live Births           1       Home Medications    Prior to Admission medications   Medication Sig Start Date End Date Taking? Authorizing Provider  albuterol (PROVENTIL HFA;VENTOLIN HFA) 108 (90 Base) MCG/ACT inhaler Inhale 2 puffs into the lungs every 4 (four) hours as needed for wheezing or shortness of breath. 01/21/17   Janne Napoleon, NP  ibuprofen (ADVIL,MOTRIN) 600 MG tablet Take 1 tablet (600 mg total) by mouth every 6 (six) hours as needed. 11/30/16   Donnamae Jude, MD  predniSONE (DELTASONE) 50 MG tablet 1 tab po daily for 6 days. Take with food. 01/21/17   Janne Napoleon, NP  Prenatal Vit-Fe Fumarate-FA (PRENATAL MULTIVITAMIN) TABS tablet Take 1 tablet by mouth daily.     [provider]     Family History Family History  Problem Relation Age of Onset  . Epilepsy Mother   . Asthma Mother   . Cancer Paternal Grandmother        BREAST  . Cancer Maternal Aunt 52       BREAST  . Hypertension Maternal Grandmother     Social History Social History  Substance Use Topics  . Smoking status: Never Smoker  . Smokeless tobacco: Never Used  . Alcohol use No     Allergies   Patient has no known allergies.   Review of Systems Review of Systems  Constitutional: Negative for activity change, appetite change, chills, fatigue and fever.  HENT: Positive for postnasal drip. Negative for congestion, facial swelling, rhinorrhea and trouble swallowing.   Eyes: Negative.   Respiratory: Positive for cough.   Cardiovascular: Negative.   Musculoskeletal: Negative for neck pain and neck stiffness.  Skin: Negative for pallor and rash.  Neurological: Negative.   All other systems reviewed and are negative.    Physical Exam Triage Vital Signs ED Triage Vitals [01/21/17 1818]  Enc Vitals Group     BP (!) 171/99     Pulse Rate 81     Resp 18  Temp 98.6 F (37 C)     Temp Source Oral     SpO2 100 %     Weight      Height      Head Circumference      Peak Flow      Pain Score 6     Pain Loc      Pain Edu?      Excl. in [place]?    No data found.   Updated Vital Signs BP (!) 171/99 (BP Location: Right Arm)   Pulse 81   Temp 98.6 F (37 C) (Oral)   Resp 18   SpO2 100%   Visual Acuity Right Eye Distance:   Left Eye Distance:   Bilateral Distance:    Right Eye Near:   Left Eye Near:    Bilateral Near:     Physical Exam  Constitutional: She is oriented to person, place, and time. She appears well-developed and well-nourished. No distress.  HENT:  Bilateral TMs are normal. Oropharynx with moderate amount of thick clear PND. No exudate  Eyes: EOM are normal.  Neck: Normal range of motion. Neck supple.  Cardiovascular: Normal rate, regular rhythm, normal  heart sounds and intact distal pulses.   Pulmonary/Chest: Effort normal.  Tidal volume lungs are clear. With card forced expiration and cough there is mild coarseness bilaterally. Taking a deep breath produces paroxysmal coughing.  Musculoskeletal: Normal range of motion.  Lymphadenopathy:    She has no cervical adenopathy.  Neurological: She is alert and oriented to person, place, and time.  Skin: Skin is warm and dry.  Nursing note and vitals reviewed.    UC Treatments / Results  Labs (all labs ordered are listed, but only abnormal results are displayed) Labs Reviewed - No data to display  EKG  EKG Interpretation None       Radiology No results found.  Procedures Procedures (including critical care time)  Medications Ordered in UC Medications - No data to display   Initial Impression / Assessment and Plan / UC Course  I have reviewed the triage vital signs and the nursing notes.  Pertinent labs & imaging results that were available during my care of the patient were reviewed by me and considered in my medical decision making (see chart for details).    Recommend taking a nonsedating antihistamine during the day such as Allegra or Zyrtec. If you need something stronger at nighttime he may take chlorpheniramine which is Chlor-Trimeton 2 or 4 mg every 4 hours.take the prednisone as directed with food. Use the albuterol inhaler 2 puffs every 4 hours as needed for cough or wheeze.     Final Clinical Impressions(s) / UC Diagnoses   Final diagnoses:  Cough  Bronchospasm  PND (post-nasal drip)    New Prescriptions New Prescriptions   ALBUTEROL (PROVENTIL HFA;VENTOLIN HFA) 108 (90 BASE) MCG/ACT INHALER    Inhale 2 puffs into the lungs every 4 (four) hours as needed for wheezing or shortness of breath.   PREDNISONE (DELTASONE) 50 MG TABLET    1 tab po daily for 6 days. Take with food.     Controlled Substance Prescriptions [place] Controlled Substance Registry  consulted? Not Applicable   Janne Napoleon, NP 01/21/17 2023

## 2017-04-09 NOTE — L&D Delivery Note (Signed)
Delivery Note At 5:43 AM a viable female was delivered via Vaginal, Spontaneous (Presentation: direct OA).  APGAR: 6, 9; weight pending.   Placenta status: delivered spontaneously and completely.  Cord: three vessel with the following complications: nuchal x1, somersault maneuver.  Anesthesia:  Epidural Episiotomy: None Lacerations: Bilateral labial, superficial and hemostatic Suture Repair: n/a Est. Blood Loss (mL): 100  Mom to postpartum.  Baby to Couplet care / Skin to Skin.  Marikay Alar 12/12/2017, 5:56 AM

## 2017-04-30 LAB — OB RESULTS CONSOLE RPR: RPR: NONREACTIVE

## 2017-04-30 LAB — OB RESULTS CONSOLE ABO/RH

## 2017-04-30 LAB — OB RESULTS CONSOLE GC/CHLAMYDIA
Chlamydia: NEGATIVE
GC PROBE AMP, GENITAL: NEGATIVE

## 2017-04-30 LAB — OB RESULTS CONSOLE ANTIBODY SCREEN: ANTIBODY SCREEN: NEGATIVE

## 2017-04-30 LAB — OB RESULTS CONSOLE HIV ANTIBODY (ROUTINE TESTING): HIV: NONREACTIVE

## 2017-04-30 LAB — OB RESULTS CONSOLE HEPATITIS B SURFACE ANTIGEN: Hepatitis B Surface Ag: NEGATIVE

## 2017-04-30 LAB — OB RESULTS CONSOLE RUBELLA ANTIBODY, IGM: RUBELLA: IMMUNE

## 2017-05-21 ENCOUNTER — Inpatient Hospital Stay (HOSPITAL_COMMUNITY)
Admission: AD | Admit: 2017-05-21 | Discharge: 2017-05-21 | Disposition: A | Discharge status: Home / Self Care | Payer: Medicaid Other | Source: Ambulatory Visit | Attending: Obstetrics & Gynecology | Admitting: Obstetrics & Gynecology

## 2017-05-21 ENCOUNTER — Encounter (HOSPITAL_COMMUNITY): Payer: Self-pay | Admitting: *Deleted

## 2017-05-21 DIAGNOSIS — O219 Vomiting of pregnancy, unspecified: Secondary | ICD-10-CM | POA: Diagnosis not present

## 2017-05-21 DIAGNOSIS — Z7952 Long term (current) use of systemic steroids: Secondary | ICD-10-CM | POA: Diagnosis not present

## 2017-05-21 DIAGNOSIS — Z3A09 9 weeks gestation of pregnancy: Secondary | ICD-10-CM

## 2017-05-21 DIAGNOSIS — O99611 Diseases of the digestive system complicating pregnancy, first trimester: Secondary | ICD-10-CM | POA: Insufficient documentation

## 2017-05-21 DIAGNOSIS — Z79899 Other long term (current) drug therapy: Secondary | ICD-10-CM | POA: Diagnosis not present

## 2017-05-21 DIAGNOSIS — O26891 Other specified pregnancy related conditions, first trimester: Secondary | ICD-10-CM | POA: Diagnosis not present

## 2017-05-21 DIAGNOSIS — R109 Unspecified abdominal pain: Secondary | ICD-10-CM | POA: Diagnosis not present

## 2017-05-21 DIAGNOSIS — Z3491 Encounter for supervision of normal pregnancy, unspecified, first trimester: Secondary | ICD-10-CM

## 2017-05-21 DIAGNOSIS — O26899 Other specified pregnancy related conditions, unspecified trimester: Secondary | ICD-10-CM | POA: Diagnosis not present

## 2017-05-21 DIAGNOSIS — K219 Gastro-esophageal reflux disease without esophagitis: Secondary | ICD-10-CM | POA: Insufficient documentation

## 2017-05-21 LAB — URINALYSIS, ROUTINE W REFLEX MICROSCOPIC
Bilirubin Urine: NEGATIVE
Glucose, UA: 100 mg/dL — AB
Hgb urine dipstick: NEGATIVE
Ketones, ur: 15 mg/dL — AB
Leukocytes, UA: NEGATIVE
Nitrite: NEGATIVE
Protein, ur: 30 mg/dL — AB
Specific Gravity, Urine: 1.02 (ref 1.005–1.030)
pH: 7 (ref 5.0–8.0)

## 2017-05-21 LAB — POCT PREGNANCY, URINE: Preg Test, Ur: POSITIVE — AB

## 2017-05-21 LAB — URINALYSIS, MICROSCOPIC (REFLEX): RBC / HPF: NONE SEEN RBC/hpf (ref 0–5)

## 2017-05-21 NOTE — MAU Provider Note (Signed)
History     CSN: [id number]  Arrival date and time: 05/21/17 2585   First Provider Initiated Contact with Patient 05/21/17 1023      Chief Complaint  Patient presents with  . Abdominal Pain  . Emesis   HPI Tara Bradshaw is a 32 y.o. G3P1011 at [redacted]w[redacted]d who presents with abdominal cramping. Symptoms began yesterday. Reports intermittent lower abdominal cramping. Rates pain 5/10. Has not treated symptoms. Nothing makes better or worse. Pt concerned d/t history of 9 week miscarriage in August of last year. Also reports n/v. Has vomited twice today. Does not take anything for symptoms d/t concern for miscarriage. Denies diarrhea, dysuria, vaginal bleeding, or vaginal discharge. Saw [facility] CNM last week & had ultrasound that confirmed IUP & dating c/w LMP.   OB History    Gravida Para Term Preterm AB Living   3 1 1   1 1    SAB TAB Ectopic Multiple Live Births   1       1      Obstetric Comments   G2- D&C for missed AB      Past Medical History:  Diagnosis Date  . Anemia    CHRONIC  . GERD (gastroesophageal reflux disease)    WITH PREGNANCY  . Pneumonia    x 1    Past Surgical History:  Procedure Laterality Date  . DILATION AND EVACUATION N/A 11/30/2016   Procedure: DILATATION AND EVACUATION;  Surgeon: Donnamae Jude, MD;  Location: [facility];  Service: Gynecology;  Laterality: N/A;  . WISDOM TOOTH EXTRACTION    . WISDOM TOOTH EXTRACTION     NO ANESTHESIA    Family History  Problem Relation Age of Onset  . Epilepsy Mother   . Asthma Mother   . Cancer Paternal Grandmother        BREAST  . Cancer Maternal Aunt 52       BREAST  . Hypertension Maternal Grandmother     Social History   Tobacco Use  . Smoking status: Never Smoker  . Smokeless tobacco: Never Used  Substance Use Topics  . Alcohol use: No  . Drug use: No    Allergies: No Known Allergies  Medications Prior to Admission  Medication Sig Dispense Refill Last Dose  . albuterol (PROVENTIL HFA;VENTOLIN HFA)  108 (90 Base) MCG/ACT inhaler Inhale 2 puffs into the lungs every 4 (four) hours as needed for wheezing or shortness of breath. 1 Inhaler 0   . ibuprofen (ADVIL,MOTRIN) 600 MG tablet Take 1 tablet (600 mg total) by mouth every 6 (six) hours as needed. 30 tablet 1 Taking  . predniSONE (DELTASONE) 50 MG tablet 1 tab po daily for 6 days. Take with food. 6 tablet 0   . Prenatal Vit-Fe Fumarate-FA (PRENATAL MULTIVITAMIN) TABS tablet Take 1 tablet by mouth daily.    Not Taking    Review of Systems  Constitutional: Negative.   Gastrointestinal: Positive for abdominal pain, nausea and vomiting. Negative for constipation and diarrhea.  Genitourinary: Negative.    Physical Exam   Blood pressure 133/78, pulse 71, temperature 98.3 F (36.8 C), temperature source Oral, resp. rate 16, height 5\' 2"  (1.575 m), weight 261 lb (118.4 kg), last menstrual period 03/13/2017, SpO2 100 %, unknown if currently breastfeeding.  Physical Exam  Nursing note and vitals reviewed. Constitutional: She is oriented to person, place, and time. She appears well-developed and well-nourished. No distress.  HENT:  Head: Normocephalic and atraumatic.  Eyes: Conjunctivae are normal. Right eye exhibits no  discharge. Left eye exhibits no discharge. No scleral icterus.  Neck: Normal range of motion.  Respiratory: Effort normal. No respiratory distress.  GI: Soft. There is no tenderness.  Genitourinary:  Genitourinary Comments: Cervix closed/thick  Neurological: She is alert and oriented to person, place, and time.  Skin: Skin is warm and dry. She is not diaphoretic.  Psychiatric: She has a normal mood and affect. Her behavior is normal. Judgment and thought content normal.    MAU Course  Procedures Results for orders placed or performed during the hospital encounter of 05/21/17 (from the past 24 hour(s))  Urinalysis, Routine w reflex microscopic     Status: Abnormal   Collection Time: 05/21/17  9:38 AM  Result Value Ref  Range   Color, Urine YELLOW YELLOW   APPearance CLEAR CLEAR   Specific Gravity, Urine 1.020 1.005 - 1.030   pH 7.0 5.0 - 8.0   Glucose, UA 100 (A) NEGATIVE mg/dL   Hgb urine dipstick NEGATIVE NEGATIVE   Bilirubin Urine NEGATIVE NEGATIVE   Ketones, ur 15 (A) NEGATIVE mg/dL   Protein, ur 30 (A) NEGATIVE mg/dL   Nitrite NEGATIVE NEGATIVE   Leukocytes, UA NEGATIVE NEGATIVE  Urinalysis, Microscopic (reflex)     Status: Abnormal   Collection Time: 05/21/17  9:38 AM  Result Value Ref Range   RBC / HPF NONE SEEN 0 - 5 RBC/hpf   WBC, UA 0-5 0 - 5 WBC/hpf   Bacteria, UA RARE (A) NONE SEEN   Squamous Epithelial / LPF 0-5 (A) NONE SEEN  Pregnancy, urine POC     Status: Abnormal   Collection Time: 05/21/17  9:55 AM  Result Value Ref Range   Preg Test, Ur POSITIVE (A) NEGATIVE    MDM Informal bedside ultrasound performed -- IUP present with cardiac activity in 150s Cervix closed Pt declines medicine for nausea C/w Dr. Alwyn Pea. Ok to discharge home.   Assessment and Plan  A; 1. Abdominal cramping affecting pregnancy   2. [redacted] weeks gestation of pregnancy   3. Fetal heart tones present, first trimester   4. Nausea and vomiting during pregnancy prior to [redacted] weeks gestation    P: Discharge home Discussed reasons to return to MAU Keep f/u with OB  Jorje Guild 05/21/2017, 10:23 AM

## 2017-05-21 NOTE — Discharge Instructions (Signed)
Morning Sickness °Morning sickness is when you feel sick to your stomach (nauseous) during pregnancy. This nauseous feeling may or may not come with vomiting. It often occurs in the morning but can be a problem any time of day. Morning sickness is most common during the first trimester, but it may continue throughout pregnancy. While morning sickness is unpleasant, it is usually harmless unless you develop severe and continual vomiting (hyperemesis gravidarum). This condition requires more intense treatment. °What are the causes? °The cause of morning sickness is not completely known but seems to be related to normal hormonal changes that occur in pregnancy. °What increases the risk? °You are at greater risk if you: °· Experienced nausea or vomiting before your pregnancy. °· Had morning sickness during a previous pregnancy. °· Are pregnant with more than one baby, such as twins. ° °How is this treated? °Do not use any medicines (prescription, over-the-counter, or herbal) for morning sickness without first talking to your health care provider. Your health care provider may prescribe or recommend: °· Vitamin B6 supplements. °· Anti-nausea medicines. °· The herbal medicine ginger. ° °Follow these instructions at home: °· Only take over-the-counter or prescription medicines as directed by your health care provider. °· Taking multivitamins before getting pregnant can prevent or decrease the severity of morning sickness in most women. °· Eat a piece of dry toast or unsalted crackers before getting out of bed in the morning. °· Eat five or six small meals a day. °· Eat dry and bland foods (rice, baked potato). Foods high in carbohydrates are often helpful. °· Do not drink liquids with your meals. Drink liquids between meals. °· Avoid greasy, fatty, and spicy foods. °· Get someone to cook for you if the smell of any food causes nausea and vomiting. °· If you feel nauseous after taking prenatal vitamins, take the vitamins at  night or with a snack. °· Snack on protein foods (nuts, yogurt, cheese) between meals if you are hungry. °· Eat unsweetened gelatins for desserts. °· Wearing an acupressure wristband (worn for sea sickness) may be helpful. °· Acupuncture may be helpful. °· Do not smoke. °· Get a humidifier to keep the air in your house free of odors. °· Get plenty of fresh air. °Contact a health care provider if: °· Your home remedies are not working, and you need medicine. °· You feel dizzy or lightheaded. °· You are losing weight. °Get help right away if: °· You have persistent and uncontrolled nausea and vomiting. °· You pass out (faint). °This information is not intended to replace advice given to you by your health care provider. Make sure you discuss any questions you have with your health care provider. °Document Released: 05/17/2006 Document Revised: 09/01/2015 Document Reviewed: 09/10/2012 °Elsevier Interactive Patient Education © 2017 [employer] ° °
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
## 2017-06-11 ENCOUNTER — Emergency Department (HOSPITAL_COMMUNITY)
Admission: EM | Admit: 2017-06-11 | Discharge: 2017-06-11 | Disposition: A | Discharge status: Home / Self Care | Payer: Medicaid Other | Attending: Emergency Medicine | Admitting: Emergency Medicine

## 2017-06-11 ENCOUNTER — Other Ambulatory Visit: Payer: Self-pay

## 2017-06-11 ENCOUNTER — Encounter (HOSPITAL_COMMUNITY): Payer: Self-pay | Admitting: Emergency Medicine

## 2017-06-11 DIAGNOSIS — Z3A12 12 weeks gestation of pregnancy: Secondary | ICD-10-CM | POA: Diagnosis not present

## 2017-06-11 DIAGNOSIS — E860 Dehydration: Secondary | ICD-10-CM

## 2017-06-11 DIAGNOSIS — O26891 Other specified pregnancy related conditions, first trimester: Secondary | ICD-10-CM | POA: Insufficient documentation

## 2017-06-11 DIAGNOSIS — M545 Low back pain, unspecified: Secondary | ICD-10-CM

## 2017-06-11 DIAGNOSIS — R1010 Upper abdominal pain, unspecified: Secondary | ICD-10-CM | POA: Insufficient documentation

## 2017-06-11 LAB — URINALYSIS, ROUTINE W REFLEX MICROSCOPIC
BACTERIA UA: NONE SEEN
BILIRUBIN URINE: NEGATIVE
Glucose, UA: NEGATIVE mg/dL
Hgb urine dipstick: NEGATIVE
Ketones, ur: 80 mg/dL — AB
Leukocytes, UA: NEGATIVE
Nitrite: NEGATIVE
PH: 8 (ref 5.0–8.0)
Protein, ur: 30 mg/dL — AB
SPECIFIC GRAVITY, URINE: 1.019 (ref 1.005–1.030)

## 2017-06-11 LAB — CBC WITH DIFFERENTIAL/PLATELET
BASOS ABS: 0 10*3/uL (ref 0.0–0.1)
Basophils Relative: 0 %
EOS PCT: 1 %
Eosinophils Absolute: 0.1 10*3/uL (ref 0.0–0.7)
HEMATOCRIT: 32.1 % — AB (ref 36.0–46.0)
Hemoglobin: 10.8 g/dL — ABNORMAL LOW (ref 12.0–15.0)
LYMPHS PCT: 18 %
Lymphs Abs: 1.1 10*3/uL (ref 0.7–4.0)
MCH: 26.9 pg (ref 26.0–34.0)
MCHC: 33.6 g/dL (ref 30.0–36.0)
MCV: 80 fL (ref 78.0–100.0)
MONO ABS: 0.7 10*3/uL (ref 0.1–1.0)
MONOS PCT: 11 %
NEUTROS ABS: 4.5 10*3/uL (ref 1.7–7.7)
Neutrophils Relative %: 70 %
Platelets: 261 10*3/uL (ref 150–400)
RBC: 4.01 MIL/uL (ref 3.87–5.11)
RDW: 14 % (ref 11.5–15.5)
WBC: 6.4 10*3/uL (ref 4.0–10.5)

## 2017-06-11 LAB — COMPREHENSIVE METABOLIC PANEL
ALBUMIN: 3.8 g/dL (ref 3.5–5.0)
ALT: 20 U/L (ref 14–54)
AST: 38 U/L (ref 15–41)
Alkaline Phosphatase: 48 U/L (ref 38–126)
Anion gap: 10 (ref 5–15)
BILIRUBIN TOTAL: 0.6 mg/dL (ref 0.3–1.2)
CHLORIDE: 104 mmol/L (ref 101–111)
CO2: 20 mmol/L — ABNORMAL LOW (ref 22–32)
Calcium: 9.1 mg/dL (ref 8.9–10.3)
Creatinine, Ser: 0.52 mg/dL (ref 0.44–1.00)
GFR calc Af Amer: >60 mL/min (ref >60)
GLUCOSE: 91 mg/dL (ref 65–99)
POTASSIUM: 4.4 mmol/L (ref 3.5–5.1)
Sodium: 134 mmol/L — ABNORMAL LOW (ref 135–145)
Total Protein: 7.3 g/dL (ref 6.5–8.1)

## 2017-06-11 LAB — LIPASE, BLOOD: LIPASE: 19 U/L (ref 11–51)

## 2017-06-11 MED ORDER — SODIUM CHLORIDE 0.9 % IV BOLUS (SEPSIS)
1000.0000 mL | Freq: Once | INTRAVENOUS | Status: AC
  Administered 2017-06-11: 1000 mL via INTRAVENOUS

## 2017-06-11 NOTE — ED Triage Notes (Addendum)
N/V/D since this morning with severe cramping radiating to back. Saw OB yesterday and everything was WDL. Pt is 12 weeks 6 days pregnant.

## 2017-06-11 NOTE — ED Notes (Signed)
Bed: ZY34 Expected date: 06/11/17 Expected time:  Means of arrival:  Comments: Nausea/12wks preg

## 2017-06-11 NOTE — ED Provider Notes (Signed)
[facility] DEPT Provider Note   CSN: [id number] Arrival date & time: 06/11/17  0854     History   Chief Complaint No chief complaint on file.   HPI Tara Bradshaw is a 32 y.o. female.  HPI  32 year old female who is currently about [redacted] weeks pregnant presents with back pain and abdominal pain.  She states she has chronic low midline back pain that is worse today.  Otherwise the pain feels just like her chronic back pain.  She woke up with this pain.  She is also having diffuse abdominal pain, worst in her upper abdomen during this time.  She states she thinks is because she is constipated.  Her last bowel movement was on 3/1.  She has not taken any medicines for constipation.  She took some Tylenol this morning for the pain.  She denies any urinary symptoms or vaginal bleeding.  She states she had an ultrasound yesterday for her pregnancy. No weakness or numbness in her lower extremities. She's been having problems with reflux and vomiting since becoming pregnant and was told she's lost 10 pounds.  Past Medical History:  Diagnosis Date  . Anemia    CHRONIC  . GERD (gastroesophageal reflux disease)    WITH PREGNANCY  . Pneumonia    x 1    Patient Active Problem List   Diagnosis Date Noted  . Obesity, Class III, BMI 40-49.9 (morbid obesity) ([place]) 11/26/2016  . GERD (gastroesophageal reflux disease) 11/26/2016    Past Surgical History:  Procedure Laterality Date  . DILATION AND EVACUATION N/A 11/30/2016   Procedure: DILATATION AND EVACUATION;  Surgeon: Donnamae Jude, MD;  Location: [facility];  Service: Gynecology;  Laterality: N/A;  . WISDOM TOOTH EXTRACTION     NO ANESTHESIA    OB History    Gravida Para Term Preterm AB Living   3 1 1   1 1    SAB TAB Ectopic Multiple Live Births   1       1      Obstetric Comments   G2- D&C for missed AB       Home Medications    Prior to Admission medications   Medication Sig Start Date End Date  Taking? Authorizing Provider  acetaminophen (TYLENOL) 325 MG tablet Take 650 mg by mouth every 6 (six) hours as needed for headache.   Yes [provider]  Doxylamine-Pyridoxine (DICLEGIS) 10-10 MG TBEC Take 2 tablets by mouth daily as needed (nausea).   Yes [provider]  guaifenesin (ROBITUSSIN) 100 MG/5ML syrup Take 100 mg by mouth 3 (three) times daily as needed for cough or congestion.   Yes [provider]  albuterol (PROVENTIL HFA;VENTOLIN HFA) 108 (90 Base) MCG/ACT inhaler Inhale 2 puffs into the lungs every 4 (four) hours as needed for wheezing or shortness of breath. Patient not taking: Reported on 06/11/2017 01/21/17   Janne Napoleon, NP    Family History Family History  Problem Relation Age of Onset  . Epilepsy Mother   . Asthma Mother   . Cancer Paternal Grandmother        BREAST  . Cancer Maternal Aunt 52       BREAST  . Hypertension Maternal Grandmother     Social History Social History   Tobacco Use  . Smoking status: Never Smoker  . Smokeless tobacco: Never Used  Substance Use Topics  . Alcohol use: No  . Drug use: No     Allergies  Patient has no known allergies.   Review of Systems Review of Systems  Gastrointestinal: Positive for abdominal pain, constipation, nausea and vomiting.  Genitourinary: Negative for dysuria and vaginal bleeding.  Musculoskeletal: Positive for back pain.  All other systems reviewed and are negative.    Physical Exam Updated Vital Signs BP 104/77 (BP Location: Left Arm)   Pulse 88   Temp 98.2 F (36.8 C)   Resp 18   LMP 03/13/2017   SpO2 100%   Physical Exam  Constitutional: She is oriented to person, place, and time. She appears well-developed and well-nourished.  obese  HENT:  Head: Normocephalic and atraumatic.  Right Ear: External ear normal.  Left Ear: External ear normal.  Nose: Nose normal.  Eyes: Right eye exhibits no discharge. Left eye exhibits no discharge.  Cardiovascular:  Normal rate, regular rhythm and normal heart sounds.  Pulmonary/Chest: Effort normal and breath sounds normal.  Abdominal: Soft. There is tenderness (mild, diffuse, worst in upper abdomen).  Musculoskeletal:       Lumbar back: She exhibits tenderness.  Neurological: She is alert and oriented to person, place, and time.  5/5 strength in BLE. Normal gross sensation.  Skin: Skin is warm and dry. She is not diaphoretic.  Nursing note and vitals reviewed.    ED Treatments / Results  Labs (all labs ordered are listed, but only abnormal results are displayed) Labs Reviewed  COMPREHENSIVE METABOLIC PANEL - Abnormal; Notable for the following components:      Result Value   Sodium 134 (*)    CO2 20 (*)    BUN <5 (*)    All other components within normal limits  CBC WITH DIFFERENTIAL/PLATELET - Abnormal; Notable for the following components:   Hemoglobin 10.8 (*)    HCT 32.1 (*)    All other components within normal limits  URINALYSIS, ROUTINE W REFLEX MICROSCOPIC - Abnormal; Notable for the following components:   Ketones, ur 80 (*)    Protein, ur 30 (*)    Squamous Epithelial / LPF 6-30 (*)    All other components within normal limits  URINE CULTURE  LIPASE, BLOOD    EKG  EKG Interpretation None       Radiology No results found.  Procedures Procedures (including critical care time)  Medications Ordered in ED Medications  sodium chloride 0.9 % bolus 1,000 mL (0 mLs Intravenous Stopped 06/11/17 1247)     Initial Impression / Assessment and Plan / ED Course  I have reviewed the triage vital signs and the nursing notes.  Pertinent labs & imaging results that were available during my care of the patient were reviewed by me and considered in my medical decision making (see chart for details).     The patient is overall well-appearing.  She was given IV fluids.  Her lab work is unremarkable save for ketones in her urine.  Otherwise things seem to be stable for her.  She  does not have an obvious urinary tract infection.  Unclear why her back is hurting worse today but she is neurologically and so my suspicion of CNS emergency is low.  No trauma to her back.  Her abdominal pain is mostly upper.  Given her history of constipation this certainly could be causing her symptoms.  We discussed remedies such as a laxative x1 but not more than that given her pregnancy.  We did discuss better maintenance control such as increased fluids and fiber.  She is not really vomiting at home but  having acid problems.  She was just started on H2 blockers yesterday.  I highly doubt this is an acute cholecystitis or other gallbladder pathology.  At this point I do not think any imaging is needed.  I did do a quick bedside ultrasound which shows a fetus with active heart rate in her uterus.  She shows no signs of miscarrying.  She appears stable for discharge home to follow-up with OB.  Final Clinical Impressions(s) / ED Diagnoses   Final diagnoses:  Upper abdominal pain  Midline low back pain without sciatica, unspecified chronicity  Dehydration    ED Discharge Orders    None       Sherwood Gambler, MD 06/11/17 1521

## 2017-06-12 LAB — URINE CULTURE

## 2017-07-23 ENCOUNTER — Inpatient Hospital Stay (HOSPITAL_COMMUNITY)
Admission: AD | Admit: 2017-07-23 | Discharge: 2017-07-23 | Disposition: A | Discharge status: Home / Self Care | Payer: Medicaid Other | Source: Ambulatory Visit | Attending: Obstetrics and Gynecology | Admitting: Obstetrics and Gynecology

## 2017-07-23 ENCOUNTER — Encounter (HOSPITAL_COMMUNITY): Payer: Self-pay | Admitting: *Deleted

## 2017-07-23 ENCOUNTER — Inpatient Hospital Stay (HOSPITAL_BASED_OUTPATIENT_CLINIC_OR_DEPARTMENT_OTHER): Payer: Medicaid Other

## 2017-07-23 DIAGNOSIS — M79609 Pain in unspecified limb: Secondary | ICD-10-CM

## 2017-07-23 DIAGNOSIS — M7989 Other specified soft tissue disorders: Secondary | ICD-10-CM

## 2017-07-23 DIAGNOSIS — Z3A19 19 weeks gestation of pregnancy: Secondary | ICD-10-CM | POA: Insufficient documentation

## 2017-07-23 DIAGNOSIS — O219 Vomiting of pregnancy, unspecified: Secondary | ICD-10-CM | POA: Diagnosis not present

## 2017-07-23 DIAGNOSIS — Z9889 Other specified postprocedural states: Secondary | ICD-10-CM | POA: Insufficient documentation

## 2017-07-23 DIAGNOSIS — O26892 Other specified pregnancy related conditions, second trimester: Secondary | ICD-10-CM | POA: Diagnosis not present

## 2017-07-23 DIAGNOSIS — M79661 Pain in right lower leg: Secondary | ICD-10-CM | POA: Diagnosis not present

## 2017-07-23 DIAGNOSIS — O26899 Other specified pregnancy related conditions, unspecified trimester: Secondary | ICD-10-CM

## 2017-07-23 DIAGNOSIS — O99280 Endocrine, nutritional and metabolic diseases complicating pregnancy, unspecified trimester: Secondary | ICD-10-CM

## 2017-07-23 DIAGNOSIS — E860 Dehydration: Secondary | ICD-10-CM | POA: Diagnosis present

## 2017-07-23 LAB — URINALYSIS, ROUTINE W REFLEX MICROSCOPIC
Bilirubin Urine: NEGATIVE
Glucose, UA: NEGATIVE mg/dL
Hgb urine dipstick: NEGATIVE
KETONES UR: 80 mg/dL — AB
Nitrite: NEGATIVE
PH: 5 (ref 5.0–8.0)
Protein, ur: 100 mg/dL — AB
Specific Gravity, Urine: 1.029 (ref 1.005–1.030)

## 2017-07-23 LAB — KETONES, URINE
Ketones, ur: 80 mg/dL — AB
Ketones, ur: 80 mg/dL — AB

## 2017-07-23 LAB — COMPREHENSIVE METABOLIC PANEL
ALBUMIN: 3.5 g/dL (ref 3.5–5.0)
ALT: 14 U/L (ref 14–54)
AST: 35 U/L (ref 15–41)
Alkaline Phosphatase: 69 U/L (ref 38–126)
Anion gap: 11 (ref 5–15)
BILIRUBIN TOTAL: 0.9 mg/dL (ref 0.3–1.2)
CHLORIDE: 101 mmol/L (ref 101–111)
CO2: 19 mmol/L — ABNORMAL LOW (ref 22–32)
Calcium: 8.9 mg/dL (ref 8.9–10.3)
Creatinine, Ser: 0.46 mg/dL (ref 0.44–1.00)
GFR calc Af Amer: >60 mL/min (ref >60)
GFR calc non Af Amer: >60 mL/min (ref >60)
GLUCOSE: 91 mg/dL (ref 65–99)
POTASSIUM: 4.2 mmol/L (ref 3.5–5.1)
Sodium: 131 mmol/L — ABNORMAL LOW (ref 135–145)
Total Protein: 6.9 g/dL (ref 6.5–8.1)

## 2017-07-23 LAB — MAGNESIUM: Magnesium: 1.7 mg/dL (ref 1.7–2.4)

## 2017-07-23 MED ORDER — LACTATED RINGERS IV SOLN
INTRAVENOUS | Status: DC
  Administered 2017-07-23: 18:00:00 via INTRAVENOUS

## 2017-07-23 MED ORDER — ONDANSETRON HCL 4 MG/2ML IJ SOLN
4.0000 mg | Freq: Once | INTRAMUSCULAR | Status: AC
  Administered 2017-07-23: 4 mg via INTRAVENOUS
  Filled 2017-07-23: qty 2

## 2017-07-23 MED ORDER — LACTATED RINGERS IV BOLUS
1000.0000 mL | Freq: Once | INTRAVENOUS | Status: AC
  Administered 2017-07-23: 1000 mL via INTRAVENOUS

## 2017-07-23 MED ORDER — LACTATED RINGERS IV BOLUS
1000.0000 mL | Freq: Once | INTRAVENOUS | Status: AC
Start: 2017-07-23 — End: 2017-07-23
  Administered 2017-07-23: 1000 mL via INTRAVENOUS

## 2017-07-23 MED ORDER — PANTOPRAZOLE SODIUM 20 MG PO TBEC: 20.0000 mg | DELAYED_RELEASE_TABLET | Freq: Every day | ORAL | 4 refills | Status: DC

## 2017-07-23 MED ORDER — PROMETHAZINE HCL 25 MG/ML IJ SOLN
12.5000 mg | Freq: Once | INTRAMUSCULAR | Status: DC
  Filled 2017-07-23: qty 1

## 2017-07-23 MED ORDER — LACTATED RINGERS IV SOLN
INTRAVENOUS | Status: DC
  Administered 2017-07-23: 15:00:00 via INTRAVENOUS

## 2017-07-23 NOTE — MAU Note (Signed)
Patient sent to MAU from office.  C/o n/v and pain in the right calf.  Pt states "they want me to have fluids for dehydration and an ultrasound of my leg."  Denies vaginal bleeding or discharge.

## 2017-07-23 NOTE — Progress Notes (Signed)
Preliminary result by tech-  Right lower extremity venous study completed. Negative for deep and superficial veins thrombosis. Result notified RN Jannette Spanner.  Hongying Landry Mellow (RDMS RVT) 07/23/17 2:56 PM

## 2017-07-23 NOTE — Discharge Instructions (Signed)

## 2017-07-23 NOTE — Progress Notes (Addendum)
Tara Bradshaw is a 32 y.o. female, G3P1 at 18+6 weeks, presenting for nausea/vomiting.dehydration and right calf pain. Patient was seen in the office today by Donnel Saxon CNM and was sent to MAU for rehydration and to rule out right DVT. Patient has received 1 liter of fluid and is feeling much better. Calf pain is improved. No vomiting . History OB History    Gravida  3   Para  1   Term  1   Preterm      AB  1   Living  1     SAB  1   TAB      Ectopic      Multiple      Live Births  1        Obstetric Comments  G2- D&C for missed AB       Past Medical History:  Diagnosis Date  . Anemia    CHRONIC  . GERD (gastroesophageal reflux disease)    WITH PREGNANCY  . Pneumonia    x 1   Past Surgical History:  Procedure Laterality Date  . DILATION AND EVACUATION N/A 11/30/2016   Procedure: DILATATION AND EVACUATION;  Surgeon: Donnamae Jude, MD;  Location: [facility];  Service: Gynecology;  Laterality: N/A;  . WISDOM TOOTH EXTRACTION     NO ANESTHESIA      Blood pressure 111/75, pulse 96, temperature 97.9 F (36.6 C), temperature source Oral, resp. rate 18, weight 111.8 kg (246 lb 8 oz), last menstrual period 03/13/2017, SpO2 100 %, unknown if currently breastfeeding.  General Appearance: Alert, appropriate appearance for age. No acute distress HEENT Exam: Grossly normal Psychiatric Exam: Alert and oriented, appropriate affect   Right leg doppler is negative for DVT/superficial phlebitis   +++++++++++++++++++++++++++++++++++++++++++++++++++++++++++++++  Assessment and plan:  Nausea and vomiting of pregnancy: patient to start daily Protonix (prescribed at office) and to resume Diclegis (which has worked for her in the past)  Next appointment in office: 08/05/17 for ROB and anatomy scan  Katharine Look Ashiah Karpowicz MD  Now tolerates ice chips and crackers Feeling better Desires d/c home and will call if recurrence of symptoms Patient to take Protonix daily and  Diclegis around the clock

## 2017-07-23 NOTE — Progress Notes (Signed)
Spoke with Vascular tech, will get someone here for dopplers on patient.

## 2017-07-25 ENCOUNTER — Other Ambulatory Visit: Payer: Self-pay

## 2017-07-25 ENCOUNTER — Encounter (HOSPITAL_COMMUNITY): Payer: Self-pay | Admitting: *Deleted

## 2017-07-25 ENCOUNTER — Encounter (HOSPITAL_COMMUNITY): Payer: Self-pay | Admitting: Anesthesiology

## 2017-07-25 ENCOUNTER — Inpatient Hospital Stay (HOSPITAL_COMMUNITY)
Admission: RE | Admit: 2017-07-25 | Discharge: 2017-07-26 | DRG: 833 | Disposition: A | Discharge status: Home / Self Care | Payer: Medicaid Other | Source: Ambulatory Visit | Attending: Obstetrics and Gynecology | Admitting: Obstetrics and Gynecology

## 2017-07-25 DIAGNOSIS — Z3A19 19 weeks gestation of pregnancy: Secondary | ICD-10-CM

## 2017-07-25 DIAGNOSIS — O211 Hyperemesis gravidarum with metabolic disturbance: Secondary | ICD-10-CM | POA: Diagnosis present

## 2017-07-25 DIAGNOSIS — O210 Mild hyperemesis gravidarum: Secondary | ICD-10-CM | POA: Diagnosis present

## 2017-07-25 DIAGNOSIS — O99212 Obesity complicating pregnancy, second trimester: Secondary | ICD-10-CM | POA: Diagnosis present

## 2017-07-25 LAB — CBC WITH DIFFERENTIAL/PLATELET
BASOS ABS: 0 10*3/uL (ref 0.0–0.1)
Basophils Relative: 0 %
EOS PCT: 0 %
Eosinophils Absolute: 0 10*3/uL (ref 0.0–0.7)
HCT: 28.7 % — ABNORMAL LOW (ref 36.0–46.0)
Hemoglobin: 10 g/dL — ABNORMAL LOW (ref 12.0–15.0)
LYMPHS ABS: 1.6 10*3/uL (ref 0.7–4.0)
LYMPHS PCT: 31 %
MCH: 27.3 pg (ref 26.0–34.0)
MCHC: 34.8 g/dL (ref 30.0–36.0)
MCV: 78.4 fL (ref 78.0–100.0)
Monocytes Absolute: 0.4 10*3/uL (ref 0.1–1.0)
Monocytes Relative: 8 %
Neutro Abs: 3.2 10*3/uL (ref 1.7–7.7)
Neutrophils Relative %: 61 %
PLATELETS: 216 10*3/uL (ref 150–400)
RBC: 3.66 MIL/uL — ABNORMAL LOW (ref 3.87–5.11)
RDW: 13.6 % (ref 11.5–15.5)
WBC: 5.2 10*3/uL (ref 4.0–10.5)

## 2017-07-25 LAB — URINALYSIS, ROUTINE W REFLEX MICROSCOPIC
BACTERIA UA: NONE SEEN
Bilirubin Urine: NEGATIVE
GLUCOSE, UA: NEGATIVE mg/dL
HGB URINE DIPSTICK: NEGATIVE
KETONES UR: 80 mg/dL — AB
NITRITE: NEGATIVE
PROTEIN: NEGATIVE mg/dL
Specific Gravity, Urine: 1.006 (ref 1.005–1.030)
pH: 6 (ref 5.0–8.0)

## 2017-07-25 LAB — COMPREHENSIVE METABOLIC PANEL
ALT: 15 U/L (ref 14–54)
AST: 29 U/L (ref 15–41)
Albumin: 3.1 g/dL — ABNORMAL LOW (ref 3.5–5.0)
Alkaline Phosphatase: 67 U/L (ref 38–126)
Anion gap: 10 (ref 5–15)
CALCIUM: 8.6 mg/dL — AB (ref 8.9–10.3)
CHLORIDE: 103 mmol/L (ref 101–111)
CO2: 20 mmol/L — ABNORMAL LOW (ref 22–32)
CREATININE: 0.37 mg/dL — AB (ref 0.44–1.00)
GFR calc Af Amer: >60 mL/min (ref >60)
Glucose, Bld: 85 mg/dL (ref 65–99)
Potassium: 3.2 mmol/L — ABNORMAL LOW (ref 3.5–5.1)
Sodium: 133 mmol/L — ABNORMAL LOW (ref 135–145)
Total Bilirubin: 0.4 mg/dL (ref 0.3–1.2)
Total Protein: 6.3 g/dL — ABNORMAL LOW (ref 6.5–8.1)

## 2017-07-25 LAB — TYPE AND SCREEN
ABO/RH(D): O POS
Antibody Screen: NEGATIVE

## 2017-07-25 LAB — AMYLASE: Amylase: 52 U/L (ref 28–100)

## 2017-07-25 MED ORDER — ACETAMINOPHEN 325 MG PO TABS: 650.0000 mg | ORAL_TABLET | ORAL | Status: DC | PRN

## 2017-07-25 MED ORDER — LACTATED RINGERS IV SOLN: INTRAVENOUS | Status: DC

## 2017-07-25 MED ORDER — METOCLOPRAMIDE HCL 5 MG/ML IJ SOLN
5.0000 mg | Freq: Three times a day (TID) | INTRAMUSCULAR | Status: DC
  Administered 2017-07-25 – 2017-07-26 (×4): 5 mg via INTRAVENOUS
  Filled 2017-07-25 (×4): qty 2

## 2017-07-25 MED ORDER — POTASSIUM CHLORIDE 2 MEQ/ML IV SOLN
INTRAVENOUS | Status: DC
  Administered 2017-07-25 – 2017-07-26 (×3): via INTRAVENOUS
  Filled 2017-07-25 (×8): qty 1000

## 2017-07-25 MED ORDER — ONDANSETRON HCL 4 MG/2ML IJ SOLN
4.0000 mg | Freq: Four times a day (QID) | INTRAMUSCULAR | Status: DC
  Administered 2017-07-25 – 2017-07-26 (×5): 4 mg via INTRAVENOUS
  Filled 2017-07-25 (×5): qty 2

## 2017-07-25 MED ORDER — CALCIUM CARBONATE ANTACID 500 MG PO CHEW: 2.0000 | CHEWABLE_TABLET | ORAL | Status: DC | PRN

## 2017-07-25 MED ORDER — DOCUSATE SODIUM 100 MG PO CAPS
100.0000 mg | ORAL_CAPSULE | Freq: Every day | ORAL | Status: DC
  Filled 2017-07-25: qty 1

## 2017-07-25 MED ORDER — ZOLPIDEM TARTRATE 5 MG PO TABS: 5.0000 mg | ORAL_TABLET | Freq: Every evening | ORAL | Status: DC | PRN

## 2017-07-25 MED ORDER — LACTATED RINGERS IV BOLUS
500.0000 mL | Freq: Once | INTRAVENOUS | Status: AC
  Administered 2017-07-25: 500 mL via INTRAVENOUS

## 2017-07-25 MED ORDER — FAMOTIDINE IN NACL 20-0.9 MG/50ML-% IV SOLN
20.0000 mg | Freq: Two times a day (BID) | INTRAVENOUS | Status: DC
  Administered 2017-07-25 – 2017-07-26 (×3): 20 mg via INTRAVENOUS
  Filled 2017-07-25 (×4): qty 50

## 2017-07-25 MED ORDER — PRENATAL MULTIVITAMIN CH: 1.0000 | ORAL_TABLET | Freq: Every day | ORAL | Status: DC

## 2017-07-25 NOTE — H&P (Signed)
Reason for admission:  Hyperemesis  HPI:  Tara Bradshaw is a 32 y.o. female, G3P1 at 19+1 weeks, presenting for persistent nausea/vomiting/dehydration and inability to keep anything down.. Patient was seen in MAU 07/23/17 and received 3 liters of IV fluid and was able to tolerate ice chips and crackers. Patient declined admission at that time.Patient was also ruled out right DVT with negative dopplers for she was c/o right calf pain. She called this morning requesting admission because of inability to keep anything down for 24 hours. She continues to have a dry cough with GERD.   Her pregnancy has been followed at [facility] since 6 weeks and is remarkable for:  1. Maternal morbid obesity with BMI 47 2. HgbA1C 5.8 at NOB with a normal 1 hour GTT 07/01/17 3. Normal Quad screen  . History         OB History    Gravida  3   Para  1   Term  1   Preterm      AB  1   Living  1     SAB  1   TAB      Ectopic      Multiple      Live Births  1        Obstetric Comments  G2- D&C for missed AB           Past Medical History:  Diagnosis Date  . Anemia    CHRONIC  . GERD (gastroesophageal reflux disease)    WITH PREGNANCY  . Pneumonia    x 1        Past Surgical History:  Procedure Laterality Date  . DILATION AND EVACUATION N/A 11/30/2016   Procedure: DILATATION AND EVACUATION;  Surgeon: Donnamae Jude, MD;  Location: [facility];  Service: Gynecology;  Laterality: N/A;  . WISDOM TOOTH EXTRACTION     NO ANESTHESIA    Blood pressure 111/75, pulse 96, temperature 97.9 F (36.6 C), temperature source Oral, resp. rate 18, weight 111.8 kg (246 lb 8 oz), last menstrual period 03/13/2017, SpO2 100 %, unknown if currently breastfeeding.  General Appearance: Alert, appropriate appearance for age. No acute distress HEENT Exam: Grossly normal Psychiatric Exam: Alert and oriented, appropriate affect Heart: normal Lungs: clear Abdomen: gravid of appropriate  size for dates, NT Extremities: no edema and Homan's negative   Assessment and plan:  19+1 weeks with hyperemesis and dehydration  1. Admit for IV fluids and hyperemesis management 2. Consider steroid taper 3. NPO to rest stomach, will try to reintroduce diet in am Plan of care reviewed with patient who is agreeable

## 2017-07-26 LAB — GLUCOSE, RANDOM: Glucose, Bld: 83 mg/dL (ref 65–99)

## 2017-07-26 MED ORDER — METHYLPREDNISOLONE 4 MG PO TABS: 8.0000 mg | ORAL_TABLET | Freq: Every day | ORAL | Status: DC

## 2017-07-26 MED ORDER — FAMOTIDINE 20 MG PO TABS: 20.0000 mg | ORAL_TABLET | Freq: Two times a day (BID) | ORAL | 1 refills | Status: DC

## 2017-07-26 MED ORDER — METHYLPREDNISOLONE 16 MG PO TABS
16.0000 mg | ORAL_TABLET | Freq: Every day | ORAL | Status: DC
  Filled 2017-07-26: qty 1

## 2017-07-26 MED ORDER — METHYLPREDNISOLONE 4 MG PO TABS: 4.0000 mg | ORAL_TABLET | Freq: Every day | ORAL | Status: DC

## 2017-07-26 MED ORDER — METHYLPREDNISOLONE 8 MG PO TABS: ORAL_TABLET | ORAL | 0 refills | Status: AC

## 2017-07-26 MED ORDER — ONDANSETRON 8 MG PO TBDP: 8.0000 mg | ORAL_TABLET | Freq: Three times a day (TID) | ORAL | 0 refills | Status: DC | PRN

## 2017-07-26 MED ORDER — METHYLPREDNISOLONE SODIUM SUCC 125 MG IJ SOLR
48.0000 mg | Freq: Once | INTRAMUSCULAR | Status: AC
  Administered 2017-07-26: 48 mg via INTRAVENOUS
  Filled 2017-07-26: qty 0.77

## 2017-07-26 MED ORDER — METOCLOPRAMIDE HCL 10 MG PO TABS: 10.0000 mg | ORAL_TABLET | Freq: Four times a day (QID) | ORAL | 1 refills | Status: DC | PRN

## 2017-07-26 MED ORDER — METHYLPREDNISOLONE 16 MG PO TABS: 16.0000 mg | ORAL_TABLET | Freq: Every day | ORAL | Status: DC

## 2017-07-26 NOTE — Progress Notes (Signed)
Patient ID: Tara Bradshaw, female   DOB: [birth year], 32 y.o.   MRN: [id number] [redacted]w[redacted]d   Admitted for hyperemesis is feeling no nausea this morning. She has been NPO during the night O: BP 124/71   Pulse 89   Temp 98.3 F (36.8 C) (Oral)   Resp 18   Ht 5\' 2"  (1.575 m)   Wt 242 lb 12 oz (110.1 kg)   LMP 03/13/2017   SpO2 96%   BMI 44.40 kg/m   Results for orders placed or performed during the hospital encounter of 07/25/17 (from the past 24 hour(s))  Glucose, fasting - daily while on taper     Status: None   Collection Time: 07/26/17  8:22 AM  Result Value Ref Range   Glucose, Bld 83 65 - 99 mg/dL   A: IUP @ 19+2     Hyperemesis P: Advance diet as tolerated      Start steroid taper- Day 1 Possible D/C later today  [place]

## 2017-07-26 NOTE — Progress Notes (Signed)
Discharge instructions and prescription provided, questions answered, pt states understanding. Signs and given copy

## 2017-07-26 NOTE — Discharge Instructions (Signed)
Eating Plan for Hyperemesis Gravidarum Hyperemesis gravidarum is a severe form of morning sickness. Because this condition causes severe nausea and vomiting, it can lead to dehydration, malnutrition, and weight loss. One way to lessen the symptoms of nausea and vomiting is to follow the eating plan for hyperemesis gravidarum. It is often used along with prescribed medicines to control your symptoms. What can I do to relieve my symptoms? Listen to your body. Everyone is different and has different preferences. Find what works best for you. Take any of the following actions that are helpful to you:  Eat and drink slowly.  Eat 5-6 small meals daily instead of 3 large meals.  Eat crackers before you get out of bed in the morning.  Try having a snack in the middle of the night.  Starchy foods are usually tolerated well. Examples include cereal, toast, bread, potatoes, pasta, rice, and pretzels.  Ginger may help with nausea. Add  tsp ground ginger to hot tea or choose ginger tea.  Try drinking 100% fruit juice or an electrolyte drink. An electrolyte drink contains sodium, potassium, and chloride.  Continue to take your prenatal vitamins as told by your health care provider. If you are having trouble taking your prenatal vitamins, talk with your health care provider about different options.  Include at least 1 serving of protein with your meals and snacks. Protein options include meats or poultry, beans, nuts, eggs, and yogurt. Try eating a protein-rich snack before bed. Examples of these snacks include cheese and crackers or half of a peanut butter or [place] sandwich.  Consider eliminating foods that trigger your symptoms. These may include spicy foods, coffee, high-fat foods, very sweet foods, and acidic foods.  Try meals that have more protein combined with bland, salty, lower-fat, and dry foods, such as nuts, seeds, pretzels, crackers, and cereal.  Talk with your healthcare provider about  starting a supplement of vitamin B6.  Have fluids that are cold, clear, and carbonated or sour. Examples include lemonade, ginger ale, lemon-lime soda, ice water, and sparkling water.  Try lemon or mint tea.  Try brushing your teeth or using a mouth rinse after meals.  What should I avoid to reduce my symptoms? Avoiding some of the following things may help reduce your symptoms.  Foods with strong smells. Try eating meals in well-ventilated areas that are free of odors.  Drinking water or other beverages with meals. Try not to drink anything during the 30 minutes before and after your meals.  Drinking more than 1 cup of fluid at a time. Sometimes using a straw helps.  Fried or high-fat foods, such as butter and cream sauces.  Spicy foods.  Skipping meals as best as you can. Nausea can be more intense on an empty stomach. If you cannot tolerate food at that time, do not force it. Try sucking on ice chips or other frozen items, and make up for missed calories later.  Lying down within 2 hours after eating.  Environmental triggers. These may include smoky rooms, closed spaces, rooms with strong smells, warm or humid places, overly loud and noisy rooms, and rooms with motion or flickering lights.  Quick and sudden changes in your movement.  This information is not intended to replace advice given to you by your health care provider. Make sure you discuss any questions you have with your health care provider. Document Released: 01/21/2007 Document Revised: 11/23/2015 Document Reviewed: 10/25/2015 Elsevier Interactive Patient Education  2018 [employer].   Hyperemesis Gravidarum  Hyperemesis gravidarum is a severe form of nausea and vomiting that happens during pregnancy. Hyperemesis is worse than morning sickness. It may cause you to have nausea or vomiting all day for many days. It may keep you from eating and drinking enough food and liquids. Hyperemesis usually occurs during the  first half (the first 20 weeks) of pregnancy. It often goes away once a woman is in her second half of pregnancy. However, sometimes hyperemesis continues through an entire pregnancy. What are the causes? The cause of this condition is not known. It may be related to changes in chemicals (hormones) in the body during pregnancy, such as the high level of pregnancy hormone (human chorionic gonadotropin) or the increase in the female sex hormone (estrogen). What are the signs or symptoms? Symptoms of this condition include:  Severe nausea and vomiting.  Nausea that does not go away.  Vomiting that does not allow you to keep any food down.  Weight loss.  Body fluid loss (dehydration).  Having no desire to eat, or not liking food that you have previously enjoyed.  How is this diagnosed? This condition may be diagnosed based on:  A physical exam.  Your medical history.  Your symptoms.  Blood tests.  Urine tests.  How is this treated? This condition may be managed with medicine. If medicines to do not help relieve nausea and vomiting, you may need to receive fluids through an IV tube at the hospital. Follow these instructions at home:  Take over-the-counter and prescription medicines only as told by your health care provider.  Avoid iron pills and multivitamins that contain iron for the first 3-4 months of pregnancy. If you take prescription iron pills, do not stop taking them unless your health care provider approves.  Take the following actions to help prevent nausea and vomiting: ? In the morning, before getting out of bed, try eating a couple of dry crackers or a piece of toast. ? Avoid foods and smells that upset your stomach. Fatty and spicy foods may make nausea worse. ? Eat 5-6 small meals a day. ? Do not drink fluids while eating meals. Drink between meals. ? Eat or suck on things that have ginger in them. Ginger can help relieve nausea. ? Avoid food preparation. The  smell of food can spoil your appetite or trigger nausea.  Follow instructions from your health care provider about eating or drinking restrictions.  For snacks, eat high-protein foods, such as cheese.  Keep all follow-up and pre-birth (prenatal) visits as told by your health care provider. This is important. Contact a health care provider if:  You have pain in your abdomen.  You have a severe headache.  You have vision problems.  You are losing weight. Get help right away if:  You cannot drink fluids without vomiting.  You vomit blood.  You have constant nausea and vomiting.  You are very weak.  You are very thirsty.  You feel dizzy.  You faint.  You have a fever or other symptoms that last for more than 2-3 days.  You have a fever and your symptoms suddenly get worse. Summary  Hyperemesis gravidarum is a severe form of nausea and vomiting that happens during pregnancy.  Making some changes to your eating habits may help relieve nausea and vomiting.  This condition may be managed with medicine.  If medicines to do not help relieve nausea and vomiting, you may need to receive fluids through an IV tube at the hospital. This information  is not intended to replace advice given to you by your health care provider. Make sure you discuss any questions you have with your health care provider. Document Released: 03/26/2005 Document Revised: 11/23/2015 Document Reviewed: 11/23/2015 Elsevier Interactive Patient Education  2017 [employer].

## 2017-07-26 NOTE — Progress Notes (Signed)
Initial Nutrition Assessment  DOCUMENTATION CODES:  Morbid obesity  INTERVENTION:  Regular diet, encourage small freq meals/snacks Ensure Enlive or Boost Breeze are supplement options  NUTRITION DIAGNOSIS:   Inadequate oral intake related to nausea, vomiting as evidenced by percent weight loss.  GOAL:   Patient will meet greater than or equal to 90% of their needs  MONITOR:   Weight trends  REASON FOR ASSESSMENT:   Malnutrition Screening Tool    ASSESSMENT:   19 2/7 weks, hyperemesis. Wt 05/21/17 261 lbs  BMI 47.9  19 lbs weight loss. Steroid taper initiated along with trial of regular diet    Diet Order:  Diet regular Room service appropriate? Yes; Fluid consistency: Thin  EDUCATION NEEDS:   No education needs have been identified at this time        Height:   Ht Readings from Last 1 Encounters:  07/25/17 5\' 2"  (1.575 m)    Weight:   Wt Readings from Last 1 Encounters:  07/25/17 242 lb 12 oz (110.1 kg)    Ideal Body Weight:   110 lbs  BMI:  Body mass index is 44.4 kg/m.  Estimated Nutritional Needs:   Kcal:  2200-2400  Protein:  100-110 g  Fluid:  2.5 L    Weyman Rodney M.Fredderick Severance LDN Neonatal Nutrition Support Specialist/RD III Pager [phone]      Phone [phone]

## 2017-07-26 NOTE — Discharge Summary (Signed)
OB Discharge Summary     Patient Name: Tara Bradshaw DOB: Nov 01, 1985 MRN: [id number]  Date of admission: 07/25/2017 Delivering MD: This patient has no babies on file.  Date of discharge: 07/26/2017  Admitting diagnosis: 19wks hyperemesis Intrauterine pregnancy: [redacted]w[redacted]d     Secondary diagnosis:  Principal Problem:   Hyperemesis affecting pregnancy, antepartum  Additional problems:      Discharge diagnosis: hyperemesis                                                                                                 Positive FHT's this shift  Complications: None  Hospital course:  Pt received antiemetics and IVF; NPO status and then advanced to regular diet. Nausea and vomiting resolved. Medrol taper started. Discharge to home and keep next regular scheduled appointment  Physical exam  Vitals:   07/26/17 0805 07/26/17 1216 07/26/17 1217 07/26/17 1541  BP: 108/61  124/71 115/66  Pulse: 79  89 65  Resp: 18  18 19   Temp: 98.5 F (36.9 C) 98.3 F (36.8 C) 98.7 F (37.1 C)   TempSrc: Oral Oral Oral   SpO2: 97%  96% 96%  Weight:      Height:       General: alert and cooperative    DVT Evaluation: No evidence of DVT seen on physical exam. Labs: Lab Results  Component Value Date   WBC 5.2 07/25/2017   HGB 10.0 (L) 07/25/2017   HCT 28.7 (L) 07/25/2017   MCV 78.4 07/25/2017   PLT 216 07/25/2017   CMP Latest Ref Rng & Units 07/26/2017  Glucose 65 - 99 mg/dL 83  BUN 6 - 20 mg/dL -  Creatinine 0.44 - 1.00 mg/dL -  Sodium 135 - 145 mmol/L -  Potassium 3.5 - 5.1 mmol/L -  Chloride 101 - 111 mmol/L -  CO2 22 - 32 mmol/L -  Calcium 8.9 - 10.3 mg/dL -  Total Protein 6.5 - 8.1 g/dL -  Total Bilirubin 0.3 - 1.2 mg/dL -  Alkaline Phos 38 - 126 U/L -  AST 15 - 41 U/L -  ALT 14 - 54 U/L -    Discharge instruction: per After Visit Summary and "Baby and Me Booklet".  After visit meds:  Allergies as of 07/26/2017   No Known Allergies     Medication List    TAKE these  medications   acetaminophen 325 MG tablet Commonly known as:  TYLENOL Take 650 mg by mouth every 6 (six) hours as needed for headache.   albuterol 108 (90 Base) MCG/ACT inhaler Commonly known as:  PROVENTIL HFA;VENTOLIN HFA Inhale 2 puffs into the lungs every 4 (four) hours as needed for wheezing or shortness of breath.   DICLEGIS PO Take 1 tablet by mouth 2 (two) times daily.   famotidine 20 MG tablet Commonly known as:  PEPCID Take 1 tablet (20 mg total) by mouth 2 (two) times daily.   methylPREDNISolone 8 MG tablet Commonly known as:  MEDROL Take 3 tablets (24 mg total) by mouth daily for 1 day, THEN 2 tablets (16 mg total) daily for  1 day, THEN 1 tablet (8 mg total) daily for 1 day, THEN 0.5 tablets (4 mg total) daily for 1 day. Start taking on:  07/26/2017   metoCLOPramide 10 MG tablet Commonly known as:  REGLAN Take 1 tablet (10 mg total) by mouth every 6 (six) hours as needed for nausea.   ondansetron 8 MG disintegrating tablet Commonly known as:  ZOFRAN ODT Take 1 tablet (8 mg total) by mouth every 8 (eight) hours as needed for nausea or vomiting.   pantoprazole 20 MG tablet Commonly known as:  PROTONIX Take 1 tablet (20 mg total) by mouth daily.   prenatal multivitamin Tabs tablet Take 1 tablet by mouth daily at 12 noon.       Diet: routine diet  Activity: Advance as tolerated. Pelvic rest for 6 weeks.   Outpatient follow PI:RJJOACZ scheduled appointment    07/26/2017 Larey Days, CNM

## 2017-09-03 ENCOUNTER — Other Ambulatory Visit: Payer: Self-pay

## 2017-09-03 ENCOUNTER — Inpatient Hospital Stay (HOSPITAL_COMMUNITY)
Admission: AD | Admit: 2017-09-03 | Discharge: 2017-09-03 | Disposition: A | Discharge status: Home / Self Care | Payer: Medicaid Other | Source: Ambulatory Visit | Attending: Obstetrics and Gynecology | Admitting: Obstetrics and Gynecology

## 2017-09-03 ENCOUNTER — Encounter (HOSPITAL_COMMUNITY): Payer: Self-pay | Admitting: *Deleted

## 2017-09-03 DIAGNOSIS — Z3A24 24 weeks gestation of pregnancy: Secondary | ICD-10-CM

## 2017-09-03 DIAGNOSIS — O26892 Other specified pregnancy related conditions, second trimester: Secondary | ICD-10-CM | POA: Diagnosis not present

## 2017-09-03 DIAGNOSIS — Z79899 Other long term (current) drug therapy: Secondary | ICD-10-CM | POA: Insufficient documentation

## 2017-09-03 DIAGNOSIS — R079 Chest pain, unspecified: Secondary | ICD-10-CM | POA: Diagnosis not present

## 2017-09-03 DIAGNOSIS — R109 Unspecified abdominal pain: Secondary | ICD-10-CM | POA: Diagnosis present

## 2017-09-03 DIAGNOSIS — K219 Gastro-esophageal reflux disease without esophagitis: Secondary | ICD-10-CM | POA: Insufficient documentation

## 2017-09-03 DIAGNOSIS — O99612 Diseases of the digestive system complicating pregnancy, second trimester: Secondary | ICD-10-CM | POA: Insufficient documentation
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
## 2017-09-28 LAB — OB RESULTS CONSOLE GBS: GBS: POSITIVE

## 2017-10-17 ENCOUNTER — Other Ambulatory Visit: Payer: Self-pay

## 2017-10-17 ENCOUNTER — Emergency Department (HOSPITAL_COMMUNITY)
Admission: EM | Admit: 2017-10-17 | Discharge: 2017-10-17 | Disposition: A | Discharge status: Home / Self Care | Payer: Medicaid Other | Attending: Emergency Medicine | Admitting: Emergency Medicine

## 2017-10-17 ENCOUNTER — Encounter (HOSPITAL_COMMUNITY): Payer: Self-pay

## 2017-10-17 DIAGNOSIS — K0889 Other specified disorders of teeth and supporting structures: Secondary | ICD-10-CM | POA: Insufficient documentation

## 2017-10-17 DIAGNOSIS — Z79899 Other long term (current) drug therapy: Secondary | ICD-10-CM | POA: Insufficient documentation

## 2017-10-17 MED ORDER — AMOXICILLIN 500 MG PO CAPS: 500.0000 mg | ORAL_CAPSULE | Freq: Three times a day (TID) | ORAL | 0 refills | Status: DC

## 2017-10-17 NOTE — Discharge Instructions (Addendum)
Please review-information. Please use medication as directed. Please inform your OB/GYN tomorrow of the new addition to your medications. Please follow-up with dental resources, use Tylenol as needed for pain.

## 2017-10-17 NOTE — ED Triage Notes (Signed)
Pt has dental pain top right which has caused pain on the right side of her face.  Pain has been intense since 2pm.  Pt is pregnant [redacted] weeks.   Pt last prenatal care apt was 10/11/17

## 2017-10-17 NOTE — ED Notes (Signed)
Patient ambulatory to bathroom with steady gait at this time 

## 2017-10-17 NOTE — ED Provider Notes (Signed)
[facility]PART[facility] Provider Note   CSN: [id number] Arrival date & time: 10/17/17  2046     History   Chief Complaint Chief Complaint  Patient presents with  . Dental Pain    HPI Tara Bradshaw is a 32 y.o. female.  HPI   32 year old female presents today with complaints of dental pain. Patient notes a several week history of progressively worsening right upper posterior dental pain. She notes minor swelling behind the third molar, she denies any fever, decreased range of motion of the jaw or any purulent discharge. Patient notes she contacted her OB/GYN who recommended she come to the emergency room for antibiotics and referral to dental resources. She notes she has taken amoxicillin in the past during pregnancy and tolerated it well  Past Medical History:  Diagnosis Date  . Anemia    CHRONIC  . GERD (gastroesophageal reflux disease)    WITH PREGNANCY  . Pneumonia    x 1    Patient Active Problem List   Diagnosis Date Noted  . Hyperemesis affecting pregnancy, antepartum 07/25/2017  . Dehydration during pregnancy 07/23/2017  . Obesity, Class III, BMI 40-49.9 (morbid obesity) ([place]) 11/26/2016  . GERD (gastroesophageal reflux disease) 11/26/2016    Past Surgical History:  Procedure Laterality Date  . DILATION AND EVACUATION N/A 11/30/2016   Procedure: DILATATION AND EVACUATION;  Surgeon: Donnamae Jude, MD;  Location: [facility];  Service: Gynecology;  Laterality: N/A;  . WISDOM TOOTH EXTRACTION     NO ANESTHESIA     OB History    Gravida  3   Para  1   Term  1   Preterm      AB  1   Living  1     SAB  1   TAB      Ectopic      Multiple      Live Births  1        Obstetric Comments  G2- D&C for missed AB         Home Medications    Prior to Admission medications   Medication Sig Start Date End Date Taking? Authorizing Provider  acetaminophen (TYLENOL) 325 MG tablet Take 650 mg by mouth every 6 (six) hours  as needed for headache.    [provider]  albuterol (PROVENTIL HFA;VENTOLIN HFA) 108 (90 Base) MCG/ACT inhaler Inhale 2 puffs into the lungs every 4 (four) hours as needed for wheezing or shortness of breath. Patient not taking: Reported on 06/11/2017 01/21/17   Janne Napoleon, NP  amoxicillin (AMOXIL) 500 MG capsule Take 1 capsule (500 mg total) by mouth 3 (three) times daily. 10/17/17   Bhargav Barbaro, Dellis Filbert, PA-C  Doxylamine-Pyridoxine (DICLEGIS PO) Take 1 tablet by mouth 2 (two) times daily.    [provider]  famotidine (PEPCID) 20 MG tablet Take 1 tablet (20 mg total) by mouth 2 (two) times daily. 07/26/17 07/26/18  Clemmons, Bertram Gala, CNM  metoCLOPramide (REGLAN) 10 MG tablet Take 1 tablet (10 mg total) by mouth every 6 (six) hours as needed for nausea. 07/26/17   Clemmons, Cecille Rubin A, CNM  ondansetron (ZOFRAN ODT) 8 MG disintegrating tablet Take 1 tablet (8 mg total) by mouth every 8 (eight) hours as needed for nausea or vomiting. 07/26/17   Clemmons, Lori A, CNM  pantoprazole (PROTONIX) 20 MG tablet Take 1 tablet (20 mg total) by mouth daily. 07/23/17   Delsa Bern, MD  Prenatal Vit-Fe Fumarate-FA (PRENATAL MULTIVITAMIN) TABS tablet Take  1 tablet by mouth daily at 12 noon.    [provider]    Family History Family History  Problem Relation Age of Onset  . Epilepsy Mother   . Asthma Mother   . Cancer Paternal Grandmother        BREAST  . Cancer Maternal Aunt 52       BREAST  . Hypertension Maternal Grandmother     Social History Social History   Tobacco Use  . Smoking status: Never Smoker  . Smokeless tobacco: Never Used  Substance Use Topics  . Alcohol use: No  . Drug use: No     Allergies   Patient has no known allergies.   Review of Systems Review of Systems  All other systems reviewed and are negative.    Physical Exam Updated Vital Signs BP 125/71 (BP Location: Right Arm)   Pulse 76   Temp 97.7 F (36.5 C) (Oral)   Resp 16   Ht 5\' 2"   (1.575 m)   Wt 114.3 kg (252 lb)   LMP 03/13/2017   SpO2 98%   BMI 46.09 kg/m   Physical Exam  Constitutional: She is oriented to person, place, and time. She appears well-developed and well-nourished.  HENT:  Head: Normocephalic and atraumatic.  Right upper third molar with crack, minimal swelling along the posterior aspect, no fluctuance, remainder of dentition within normal limits full active range of motion of the jaw  Eyes: Pupils are equal, round, and reactive to light. Conjunctivae are normal. Right eye exhibits no discharge. Left eye exhibits no discharge. No scleral icterus.  Neck: Normal range of motion. No JVD present. No tracheal deviation present.  Pulmonary/Chest: Effort normal. No stridor.  Neurological: She is alert and oriented to person, place, and time. Coordination normal.  Psychiatric: She has a normal mood and affect. Her behavior is normal. Judgment and thought content normal.  Nursing note and vitals reviewed.   ED Treatments / Results  Labs (all labs ordered are listed, but only abnormal results are displayed) Labs Reviewed - No data to display  EKG None  Radiology No results found.  Procedures Procedures (including critical care time)  Medications Ordered in ED Medications - No data to display   Initial Impression / Assessment and Plan / ED Course  I have reviewed the triage vital signs and the nursing notes.  Pertinent labs & imaging results that were available during my care of the patient were reviewed by me and considered in my medical decision making (see chart for details).    32 year old female presents today with progressively worsening dental pain. I do have suspicion that this is likely from her fractured tooth. There is concern for infection given her worsening of symptoms and swelling. She'll be started on antibiotics, she will inform her OB/GYN of new addition of medication, she will follow-up with dental resources. She is given  strict return precautions, she verbalized understanding and agreement to today's plan had no further questions concerns at time of discharge.  Final Clinical Impressions(s) / ED Diagnoses   Final diagnoses:  Pain, dental    ED Discharge Orders        Ordered    amoxicillin (AMOXIL) 500 MG capsule  3 times daily     10/17/17 2155       Okey Regal, PA-C 10/17/17 2158    Deno Etienne, DO 10/17/17 2212

## 2017-10-24 DIAGNOSIS — Z3A32 32 weeks gestation of pregnancy: Secondary | ICD-10-CM | POA: Diagnosis not present

## 2017-10-24 DIAGNOSIS — Z3493 Encounter for supervision of normal pregnancy, unspecified, third trimester: Secondary | ICD-10-CM | POA: Diagnosis not present

## 2017-10-24 DIAGNOSIS — Z8759 Personal history of other complications of pregnancy, childbirth and the puerperium: Secondary | ICD-10-CM | POA: Diagnosis not present

## 2017-10-24 DIAGNOSIS — O99213 Obesity complicating pregnancy, third trimester: Secondary | ICD-10-CM | POA: Diagnosis not present

## 2017-11-01 DIAGNOSIS — Z3A33 33 weeks gestation of pregnancy: Secondary | ICD-10-CM | POA: Diagnosis not present

## 2017-11-01 DIAGNOSIS — Z3493 Encounter for supervision of normal pregnancy, unspecified, third trimester: Secondary | ICD-10-CM | POA: Diagnosis not present

## 2017-11-01 DIAGNOSIS — O99213 Obesity complicating pregnancy, third trimester: Secondary | ICD-10-CM | POA: Diagnosis not present

## 2017-11-06 DIAGNOSIS — Z22330 Carrier of Group B streptococcus: Secondary | ICD-10-CM | POA: Diagnosis not present

## 2017-11-06 DIAGNOSIS — O99213 Obesity complicating pregnancy, third trimester: Secondary | ICD-10-CM | POA: Diagnosis not present

## 2017-11-06 DIAGNOSIS — Z3A34 34 weeks gestation of pregnancy: Secondary | ICD-10-CM | POA: Diagnosis not present

## 2017-11-14 DIAGNOSIS — Z3A35 35 weeks gestation of pregnancy: Secondary | ICD-10-CM | POA: Diagnosis not present

## 2017-11-14 DIAGNOSIS — O99213 Obesity complicating pregnancy, third trimester: Secondary | ICD-10-CM | POA: Diagnosis not present

## 2017-11-14 DIAGNOSIS — Z3492 Encounter for supervision of normal pregnancy, unspecified, second trimester: Secondary | ICD-10-CM | POA: Diagnosis not present

## 2017-11-20 DIAGNOSIS — Z3A30 30 weeks gestation of pregnancy: Secondary | ICD-10-CM | POA: Diagnosis not present

## 2017-11-20 DIAGNOSIS — O99213 Obesity complicating pregnancy, third trimester: Secondary | ICD-10-CM | POA: Diagnosis not present

## 2017-11-28 DIAGNOSIS — O99213 Obesity complicating pregnancy, third trimester: Secondary | ICD-10-CM | POA: Diagnosis not present

## 2017-11-28 DIAGNOSIS — O358XX9 Maternal care for other (suspected) fetal abnormality and damage, other fetus: Secondary | ICD-10-CM | POA: Diagnosis not present

## 2017-11-28 DIAGNOSIS — Z3A37 37 weeks gestation of pregnancy: Secondary | ICD-10-CM | POA: Diagnosis not present

## 2017-11-29 ENCOUNTER — Encounter (HOSPITAL_COMMUNITY): Payer: Self-pay | Admitting: *Deleted

## 2017-11-29 ENCOUNTER — Inpatient Hospital Stay (HOSPITAL_COMMUNITY)
Admission: AD | Admit: 2017-11-29 | Discharge: 2017-11-29 | Disposition: A | Discharge status: Home / Self Care | Payer: Medicaid Other | Source: Ambulatory Visit | Attending: Obstetrics & Gynecology | Admitting: Obstetrics & Gynecology

## 2017-11-29 DIAGNOSIS — M545 Low back pain: Secondary | ICD-10-CM | POA: Diagnosis present

## 2017-11-29 DIAGNOSIS — O471 False labor at or after 37 completed weeks of gestation: Secondary | ICD-10-CM | POA: Insufficient documentation

## 2017-11-29 DIAGNOSIS — Z3A37 37 weeks gestation of pregnancy: Secondary | ICD-10-CM | POA: Diagnosis not present

## 2017-11-29 DIAGNOSIS — O479 False labor, unspecified: Secondary | ICD-10-CM

## 2017-11-29 NOTE — MAU Provider Note (Signed)
Chief Complaint: Contractions   SUBJECTIVE HPI: Tara Bradshaw is a 32 y.o. G3P1011 at [redacted]w[redacted]d who presents to MAU for rule out labor, pt c/o lower back pain and cxt for one day, pt wants to make sure she is not in labor, Was at [facility] one day ago and was 1 cm. Pt denies leakage of fluids, no dysuria, pt endorses +FM. No vaginal bleeding.    Past Medical History:  Diagnosis Date  . Anemia    CHRONIC  . GERD (gastroesophageal reflux disease)    WITH PREGNANCY  . Pneumonia    x 1   OB History  Gravida Para Term Preterm AB Living  3 1 1   1 1   SAB TAB Ectopic Multiple Live Births  1       1    # Outcome Date GA Lbr Len/2nd Weight Sex Delivery Anes PTL Lv  3 Current           2 SAB 11/2016 [redacted]w[redacted]d         1 Term 10/14/12 [redacted]w[redacted]d 13:31 / 01:23 2926 g M Vag-Spont EPI  LIV    Obstetric Comments  G2- D&C for missed AB   Past Surgical History:  Procedure Laterality Date  . DILATION AND EVACUATION N/A 11/30/2016   Procedure: DILATATION AND EVACUATION;  Surgeon: Donnamae Jude, MD;  Location: [facility];  Service: Gynecology;  Laterality: N/A;  . WISDOM TOOTH EXTRACTION     NO ANESTHESIA   Social History   Socioeconomic History  . Marital status: Married    Spouse name: Not on file  . Number of children: Not on file  . Years of education:  14  . Highest education level: Not on file  Occupational History  . Occupation: SECURITY OFFICER    Employer: [employer]  Social Needs  . Financial resource strain: Not on file  . Food insecurity:    Worry: Not on file    Inability: Not on file  . Transportation needs:    Medical: Not on file    Non-medical: Not on file  Tobacco Use  . Smoking status: Never Smoker  . Smokeless tobacco: Never Used  Substance and Sexual Activity  . Alcohol use: No  . Drug use: No  . Sexual activity: Yes    Partners: Male    Birth control/protection: None  Lifestyle  . Physical activity:    Days per week: Not on file    Minutes per  session: Not on file  . Stress: Not on file  Relationships  . Social connections:    Talks on phone: Not on file    Gets together: Not on file    Attends religious service: Not on file    Active member of club or organization: Not on file    Attends meetings of clubs or organizations: Not on file    Relationship status: Not on file  . Intimate partner violence:    Fear of current or ex partner: Not on file    Emotionally abused: Not on file    Physically abused: Not on file    Forced sexual activity: Not on file  Other Topics Concern  . Not on file  Social History Narrative  . Not on file   No current facility-administered medications on file prior to encounter.    Current Outpatient Medications on File Prior to Encounter  Medication Sig Dispense Refill  . acetaminophen (TYLENOL) 325 MG tablet Take 650 mg by mouth every 6 (  six) hours as needed for headache.    . flintstones complete (FLINTSTONES) 60 MG chewable tablet Chew 1 tablet by mouth daily.    Marland Kitchen amoxicillin (AMOXIL) 500 MG capsule Take 1 capsule (500 mg total) by mouth 3 (three) times daily. (Patient not taking: Reported on 11/29/2017) 30 capsule 0  . famotidine (PEPCID) 20 MG tablet Take 1 tablet (20 mg total) by mouth 2 (two) times daily. (Patient not taking: Reported on 11/29/2017) 60 tablet 1  . metoCLOPramide (REGLAN) 10 MG tablet Take 1 tablet (10 mg total) by mouth every 6 (six) hours as needed for nausea. (Patient not taking: Reported on 11/29/2017) 30 tablet 1  . ondansetron (ZOFRAN ODT) 8 MG disintegrating tablet Take 1 tablet (8 mg total) by mouth every 8 (eight) hours as needed for nausea or vomiting. 20 tablet 0  . pantoprazole (PROTONIX) 20 MG tablet Take 1 tablet (20 mg total) by mouth daily. (Patient not taking: Reported on 11/29/2017) 30 tablet 4   No Known Allergies  I have reviewed the past Medical Hx, Surgical Hx, Social Hx, Allergies and Medications.   REVIEW OF SYSTEMS All systems reviewed and are  negative for acute change except as noted in the HPI.   OBJECTIVE BP 117/69   Pulse 78   Temp 98.2 F (36.8 C) (Oral)   Resp 20   Wt 117.5 kg   LMP 03/13/2017   SpO2 100%   BMI 47.37 kg/m    PHYSICAL EXAM Constitutional: Well-developed, well-nourished female in no acute distress.  Cardiovascular: normal rate and rhythm, pulses intact Respiratory: normal rate and effort.  GI: Abd soft, non-tender, non-distended. Pos BS x 4 MS: Extremities nontender, no edema, normal ROM Neurologic: Alert and oriented x 4. No focal deficits GU: Neg CVAT. SPECULUM EXAM: Deferred  BIMANUAL:RN performed cervical check and resulted 3/60/-2. No change after one hour. Psych: normal mood and affect  LAB RESULTS No results found for this or any previous visit (from the past 24 hour(s)).  IMAGING No results found.  MAU Management/MDM: Vitals and nursing notes reviewed Orders Placed This Encounter  Procedures  . Diet - low sodium heart healthy  . Increase activity slowly  . Call MD for:  . Call MD for:  temperature >100.4  . Call MD for:  persistant nausea and vomiting  . Call MD for:  severe uncontrolled pain  . Call MD for:  redness, tenderness, or signs of infection (pain, swelling, redness, odor or green/yellow discharge around incision site)  . Call MD for:  difficulty breathing, headache or visual disturbances  . Call MD for:  hives  . Call MD for:  persistant dizziness or light-headedness  . Call MD for:  extreme fatigue  . (HEART FAILURE PATIENTS) Call MD:  Anytime you have any of the following symptoms: 1) 3 pound weight gain in 24 hours or 5 pounds in 1 week 2) shortness of breath, with or without a dry hacking cough 3) swelling in the hands, feet or stomach 4) if you have to sleep on extra pillows at night in order to breathe.  . Discharge patient Discharge disposition: 01-Home or Self Care; Discharge patient date: 11/29/2017    No orders of the defined types were placed in this  encounter.   Plan of care reviewed with patient, including labs and tests ordered and medical treatment.   ASSESSMENT 1. Braxton Hick's contraction   2. False labor   No cervical change over one hour. May be in early labor but not  active labor. Discharged home in stable condition.   PLAN Discharge home in stable condition. Pt discharged with strict labor precautions. Counseled on return precautions Handout given  [facility] Follow up in 1 week(s).   Specialty:  Obstetrics and Gynecology Why:  ROB  Contact information: [address] [address] [phone]          Allergies as of 11/29/2017   No Known Allergies     Medication List    STOP taking these medications   amoxicillin 500 MG capsule Commonly known as:  AMOXIL     TAKE these medications   acetaminophen 325 MG tablet Commonly known as:  TYLENOL Take 650 mg by mouth every 6 (six) hours as needed for headache.   famotidine 20 MG tablet Commonly known as:  PEPCID Take 1 tablet (20 mg total) by mouth 2 (two) times daily.   flintstones complete 60 MG chewable tablet Chew 1 tablet by mouth daily.   metoCLOPramide 10 MG tablet Commonly known as:  REGLAN Take 1 tablet (10 mg total) by mouth every 6 (six) hours as needed for nausea.   ondansetron 8 MG disintegrating tablet Commonly known as:  ZOFRAN-ODT Take 1 tablet (8 mg total) by mouth every 8 (eight) hours as needed for nausea or vomiting.   pantoprazole 20 MG tablet Commonly known as:  PROTONIX Take 1 tablet (20 mg total) by mouth daily.        @SIGN @ 11/29/2017, 4:43 PM

## 2017-11-29 NOTE — MAU Note (Signed)
Patient was seen in the office yesterday and was 3cm/60%.  Reports contractions have not stopped since then, coming 8 min apart.  Denies LOF or vaginal bleeding.  Reports good fetal movement.

## 2017-12-04 ENCOUNTER — Other Ambulatory Visit: Payer: Self-pay | Admitting: Obstetrics & Gynecology

## 2017-12-05 ENCOUNTER — Encounter (HOSPITAL_COMMUNITY): Payer: Self-pay | Admitting: *Deleted

## 2017-12-05 ENCOUNTER — Telehealth (HOSPITAL_COMMUNITY): Payer: Self-pay | Admitting: *Deleted

## 2017-12-05 NOTE — Telephone Encounter (Signed)
Preadmission screen  

## 2017-12-06 DIAGNOSIS — Z3A38 38 weeks gestation of pregnancy: Secondary | ICD-10-CM | POA: Diagnosis not present

## 2017-12-06 DIAGNOSIS — O99213 Obesity complicating pregnancy, third trimester: Secondary | ICD-10-CM | POA: Diagnosis not present

## 2017-12-06 DIAGNOSIS — Z3493 Encounter for supervision of normal pregnancy, unspecified, third trimester: Secondary | ICD-10-CM | POA: Diagnosis not present

## 2017-12-11 ENCOUNTER — Other Ambulatory Visit: Payer: Self-pay

## 2017-12-11 ENCOUNTER — Inpatient Hospital Stay (HOSPITAL_COMMUNITY)
Admission: AD | Admit: 2017-12-11 | Discharge: 2017-12-13 | DRG: 807 | Disposition: A | Discharge status: Home / Self Care | Payer: Medicaid Other | Attending: Obstetrics and Gynecology | Admitting: Obstetrics and Gynecology

## 2017-12-11 ENCOUNTER — Inpatient Hospital Stay (HOSPITAL_COMMUNITY): Payer: Medicaid Other | Admitting: Anesthesiology

## 2017-12-11 ENCOUNTER — Encounter (HOSPITAL_COMMUNITY): Payer: Self-pay

## 2017-12-11 DIAGNOSIS — E669 Obesity, unspecified: Secondary | ICD-10-CM | POA: Diagnosis present

## 2017-12-11 DIAGNOSIS — O99824 Streptococcus B carrier state complicating childbirth: Secondary | ICD-10-CM | POA: Diagnosis present

## 2017-12-11 DIAGNOSIS — O99214 Obesity complicating childbirth: Secondary | ICD-10-CM | POA: Diagnosis present

## 2017-12-11 DIAGNOSIS — Z3483 Encounter for supervision of other normal pregnancy, third trimester: Secondary | ICD-10-CM | POA: Diagnosis present

## 2017-12-11 DIAGNOSIS — O9081 Anemia of the puerperium: Secondary | ICD-10-CM | POA: Diagnosis not present

## 2017-12-11 DIAGNOSIS — Z3A39 39 weeks gestation of pregnancy: Secondary | ICD-10-CM

## 2017-12-11 LAB — CBC
HCT: 30.2 % — ABNORMAL LOW (ref 36.0–46.0)
Hemoglobin: 9.8 g/dL — ABNORMAL LOW (ref 12.0–15.0)
MCH: 25.3 pg — ABNORMAL LOW (ref 26.0–34.0)
MCHC: 32.5 g/dL (ref 30.0–36.0)
MCV: 77.8 fL — ABNORMAL LOW (ref 78.0–100.0)
Platelets: 202 10*3/uL (ref 150–400)
RBC: 3.88 MIL/uL (ref 3.87–5.11)
RDW: 14.4 % (ref 11.5–15.5)
WBC: 11.2 10*3/uL — ABNORMAL HIGH (ref 4.0–10.5)

## 2017-12-11 LAB — COMPREHENSIVE METABOLIC PANEL
ALT: 9 U/L (ref 0–44)
AST: 17 U/L (ref 15–41)
Albumin: 3.1 g/dL — ABNORMAL LOW (ref 3.5–5.0)
Alkaline Phosphatase: 93 U/L (ref 38–126)
Anion gap: 9 (ref 5–15)
BUN: <5 mg/dL — ABNORMAL LOW (ref 6–20)
CO2: 20 mmol/L — ABNORMAL LOW (ref 22–32)
Calcium: 9.4 mg/dL (ref 8.9–10.3)
Chloride: 104 mmol/L (ref 98–111)
Creatinine, Ser: 0.34 mg/dL — ABNORMAL LOW (ref 0.44–1.00)
GFR calc Af Amer: >60 mL/min (ref >60)
GFR calc non Af Amer: >60 mL/min (ref >60)
Glucose, Bld: 92 mg/dL (ref 70–99)
Potassium: 3.7 mmol/L (ref 3.5–5.1)
Sodium: 133 mmol/L — ABNORMAL LOW (ref 135–145)
Total Bilirubin: 0.3 mg/dL (ref 0.3–1.2)
Total Protein: 6.4 g/dL — ABNORMAL LOW (ref 6.5–8.1)

## 2017-12-11 LAB — LACTATE DEHYDROGENASE: LDH: 177 U/L (ref 98–192)

## 2017-12-11 LAB — URIC ACID: Uric Acid, Serum: 3.3 mg/dL (ref 2.5–7.1)

## 2017-12-11 LAB — TYPE AND SCREEN
ABO/RH(D): O POS
Antibody Screen: NEGATIVE

## 2017-12-11 MED ORDER — LACTATED RINGERS IV SOLN
500.0000 mL | INTRAVENOUS | Status: DC | PRN
  Administered 2017-12-12 (×2): 500 mL via INTRAVENOUS

## 2017-12-11 MED ORDER — SODIUM BICARBONATE 8.4 % IV SOLN
INTRAVENOUS | Status: DC | PRN
  Administered 2017-12-11 (×3): 4 mL via EPIDURAL

## 2017-12-11 MED ORDER — EPHEDRINE 5 MG/ML INJ
10.0000 mg | INTRAVENOUS | Status: DC | PRN
  Filled 2017-12-11: qty 2
  Filled 2017-12-11: qty 4

## 2017-12-11 MED ORDER — LACTATED RINGERS IV SOLN
INTRAVENOUS | Status: DC
  Administered 2017-12-11: 125 mL/h via INTRAVENOUS

## 2017-12-11 MED ORDER — OXYCODONE-ACETAMINOPHEN 5-325 MG PO TABS: 1.0000 | ORAL_TABLET | ORAL | Status: DC | PRN

## 2017-12-11 MED ORDER — EPHEDRINE 5 MG/ML INJ
10.0000 mg | INTRAVENOUS | Status: AC | PRN
  Administered 2017-12-12 (×2): 10 mg via INTRAVENOUS

## 2017-12-11 MED ORDER — SODIUM CHLORIDE 0.9 % IV SOLN
5.0000 10*6.[IU] | Freq: Once | INTRAVENOUS | Status: AC
  Administered 2017-12-11: 5 10*6.[IU] via INTRAVENOUS
  Filled 2017-12-11: qty 5

## 2017-12-11 MED ORDER — PHENYLEPHRINE 40 MCG/ML (10ML) SYRINGE FOR IV PUSH (FOR BLOOD PRESSURE SUPPORT)
80.0000 ug | PREFILLED_SYRINGE | INTRAVENOUS | Status: AC | PRN
  Administered 2017-12-11 (×2): 160 ug via INTRAVENOUS
  Administered 2017-12-12: 80 ug via INTRAVENOUS

## 2017-12-11 MED ORDER — OXYCODONE-ACETAMINOPHEN 5-325 MG PO TABS: 2.0000 | ORAL_TABLET | ORAL | Status: DC | PRN

## 2017-12-11 MED ORDER — FENTANYL 2.5 MCG/ML BUPIVACAINE 1/10 % EPIDURAL INFUSION (WH - ANES)
14.0000 mL/h | INTRAMUSCULAR | Status: DC | PRN
  Administered 2017-12-11: 14 mL/h via EPIDURAL
  Filled 2017-12-11: qty 100

## 2017-12-11 MED ORDER — ONDANSETRON HCL 4 MG/2ML IJ SOLN
4.0000 mg | Freq: Four times a day (QID) | INTRAMUSCULAR | Status: DC | PRN
  Administered 2017-12-12: 4 mg via INTRAVENOUS
  Filled 2017-12-11: qty 2

## 2017-12-11 MED ORDER — PENICILLIN G 3 MILLION UNITS IVPB - SIMPLE MED
3.0000 10*6.[IU] | INTRAVENOUS | Status: DC
  Administered 2017-12-11 – 2017-12-12 (×2): 3 10*6.[IU] via INTRAVENOUS
  Filled 2017-12-11: qty 3
  Filled 2017-12-11: qty 100
  Filled 2017-12-11: qty 3
  Filled 2017-12-11: qty 100

## 2017-12-11 MED ORDER — OXYTOCIN 40 UNITS IN LACTATED RINGERS INFUSION - SIMPLE MED
2.5000 [IU]/h | INTRAVENOUS | Status: DC
  Filled 2017-12-11: qty 1000

## 2017-12-11 MED ORDER — LACTATED RINGERS IV SOLN
INTRAVENOUS | Status: DC
  Administered 2017-12-11: 19:00:00 via INTRAVENOUS

## 2017-12-11 MED ORDER — FENTANYL CITRATE (PF) 100 MCG/2ML IJ SOLN: 50.0000 ug | INTRAMUSCULAR | Status: DC | PRN

## 2017-12-11 MED ORDER — LIDOCAINE HCL (PF) 1 % IJ SOLN
30.0000 mL | INTRAMUSCULAR | Status: DC | PRN
  Filled 2017-12-11: qty 30

## 2017-12-11 MED ORDER — DIPHENHYDRAMINE HCL 50 MG/ML IJ SOLN: 12.5000 mg | INTRAMUSCULAR | Status: DC | PRN

## 2017-12-11 MED ORDER — ACETAMINOPHEN 325 MG PO TABS: 650.0000 mg | ORAL_TABLET | ORAL | Status: DC | PRN

## 2017-12-11 MED ORDER — LACTATED RINGERS IV SOLN
500.0000 mL | Freq: Once | INTRAVENOUS | Status: AC
  Administered 2017-12-11: 500 mL via INTRAVENOUS

## 2017-12-11 MED ORDER — OXYTOCIN BOLUS FROM INFUSION
500.0000 mL | Freq: Once | INTRAVENOUS | Status: AC
  Administered 2017-12-12: 500 mL via INTRAVENOUS

## 2017-12-11 MED ORDER — PHENYLEPHRINE 40 MCG/ML (10ML) SYRINGE FOR IV PUSH (FOR BLOOD PRESSURE SUPPORT)
80.0000 ug | PREFILLED_SYRINGE | INTRAVENOUS | Status: DC | PRN
  Administered 2017-12-12: 80 ug via INTRAVENOUS
  Filled 2017-12-11: qty 5
  Filled 2017-12-11 (×2): qty 10

## 2017-12-11 MED ORDER — SOD CITRATE-CITRIC ACID 500-334 MG/5ML PO SOLN: 30.0000 mL | ORAL | Status: DC | PRN

## 2017-12-11 NOTE — H&P (Signed)
Tara Bradshaw is a 32 y.o. female G3P1011 @ [redacted]w[redacted]d presenting for active labor, initial B/P elevated 144/85 OB History    Gravida  3   Para  1   Term  1   Preterm      AB  1   Living  1     SAB  1   TAB      Ectopic      Multiple      Live Births  1        Obstetric Comments  G2- D&C for missed AB       Past Medical History:  Diagnosis Date  . Anemia    CHRONIC  . GERD (gastroesophageal reflux disease)    WITH PREGNANCY  . Pneumonia    x 1   Past Surgical History:  Procedure Laterality Date  . DILATION AND EVACUATION N/A 11/30/2016   Procedure: DILATATION AND EVACUATION;  Surgeon: Donnamae Jude, MD;  Location: [facility];  Service: Gynecology;  Laterality: N/A;  . WISDOM TOOTH EXTRACTION     NO ANESTHESIA   Family History: family history includes Asthma in her mother; Cancer in her paternal grandmother; Cancer (age of onset: 64) in her maternal aunt; Epilepsy in her mother; Hypertension in her maternal grandmother. Social History:  reports that she has never smoked. She has never used smokeless tobacco. She reports that she does not drink alcohol or use drugs.     Maternal Diabetes: No Genetic Screening: Normal Maternal Ultrasounds/Referrals: Normal Fetal Ultrasounds or other Referrals:  None Maternal Substance Abuse:  No Significant Maternal Medications:   Significant Maternal Lab Results:  Lab values include: Group B Strep positive Other Comments:  None  Review of Systems  Gastrointestinal: Positive for abdominal pain.  Musculoskeletal: Positive for back pain.  All other systems reviewed and are negative.  Maternal Medical History:  Reason for admission: Contractions.   Contractions: Onset was 6-12 hours ago.   Frequency: regular.   Perceived severity is moderate.    Fetal activity: Perceived fetal activity is normal.   Last perceived fetal movement was within the past hour.    Prenatal Complications - Diabetes: none.    Dilation:  4.5 Effacement (%): 80 Station: -2 Exam by:: [employer], CNM  Blood pressure (!) 144/85, pulse 83, temperature 98.4 F (36.9 C), temperature source Oral, resp. rate 20, weight 116.6 kg, last menstrual period 03/13/2017, SpO2 100 %, unknown if currently breastfeeding. Maternal Exam:  Uterine Assessment: Contraction strength is moderate.  Contraction frequency is regular.   Abdomen: Patient reports no abdominal tenderness. Estimated fetal weight is 6 1/2.   Fetal presentation: vertex  Introitus: Normal vulva. Normal vagina.  Pelvis: adequate for delivery.   Cervix: Cervix evaluated by digital exam.     Fetal Exam Fetal Monitor Review: Mode: ultrasound.   Variability: moderate (6-25 bpm).   Pattern: accelerations present and no decelerations.    Fetal State Assessment: Category I - tracings are normal.     Physical Exam  Nursing note and vitals reviewed. Constitutional: She is oriented to person, place, and time. She appears well-developed and well-nourished.  HENT:  Head: Normocephalic and atraumatic.  Cardiovascular: Normal rate and regular rhythm.  Respiratory: Effort normal.  GI: Soft.  Genitourinary: Vagina normal.  Musculoskeletal: Normal range of motion.  Neurological: She is alert and oriented to person, place, and time.  Skin: Skin is warm and dry.  Psychiatric: She has a normal mood and affect. Her behavior is normal.  Prenatal labs: ABO, Rh: --/--/O POS (04/18 1237) Antibody: NEG (04/18 1237) Rubella: Immune (01/22 0000) RPR: Nonreactive (01/22 0000)  HBsAg: Negative (01/22 0000)  HIV: Non-reactive (01/22 0000)  GBS: Positive (06/22 0000)   Assessment/Plan: IUP @ 39 weeks Acrive labor GBS Positive Multiparous  Routine [facility] orders Preeclamptic Labs Epidural when desired PCN G for ABX Prophylaxis Anticipate Vaginal Delivery Ranee Gosselin CNM to assume care at [facility] 12/11/2017, 6:35 PM

## 2017-12-11 NOTE — Anesthesia Pain Management Evaluation Note (Signed)
  CRNA Pain Management Visit Note  Patient: Tara Bradshaw, 32 y.o., female  "Hello I am a member of the anesthesia team at [facility]. We have an anesthesia team available at all times to provide care throughout the hospital, including epidural management and anesthesia for C-section. I don't know your plan for the delivery whether it a natural birth, water birth, IV sedation, nitrous supplementation, doula or epidural, but we want to meet your pain goals."   1.Was your pain managed to your expectations on prior hospitalizations?   Yes   2.What is your expectation for pain management during this hospitalization? epidural     3.How can we help you reach that goal? epidural  Record the patient's initial score and the patient's pain goal.   Pain: 8  Pain Goal: 8 [facility] wants you to be able to say your pain was always managed very well.  Kelicia Youtz 12/11/2017

## 2017-12-11 NOTE — Anesthesia Procedure Notes (Signed)
Epidural Patient location during procedure: OB Start time: 12/11/2017 11:10 PM End time: 12/11/2017 11:25 PM  Staffing Anesthesiologist: Freddrick March, MD Performed: anesthesiologist   Preanesthetic Checklist Completed: patient identified, pre-op evaluation, timeout performed, IV checked, risks and benefits discussed and monitors and equipment checked  Epidural Patient position: sitting Prep: site prepped and draped and DuraPrep Patient monitoring: continuous pulse ox, blood pressure, heart rate and cardiac monitor Approach: midline Location: L3-L4 Injection technique: LOR air  Needle:  Needle type: Tuohy  Needle gauge: 17 G Needle length: 9 cm Needle insertion depth: 7.5 cm Catheter type: closed end flexible Catheter size: 19 Gauge Catheter at skin depth: 13 cm Test dose: negative  Assessment Sensory level: T8 Events: blood not aspirated, injection not painful, no injection resistance, negative IV test and no paresthesia  Additional Notes Patient identified. Risks/Benefits/Options discussed with patient including but not limited to bleeding, infection, nerve damage, paralysis, failed block, incomplete pain control, headache, blood pressure changes, nausea, vomiting, reactions to medication both or allergic, itching and postpartum back pain. Confirmed with bedside nurse the patient's most recent platelet count. Confirmed with patient that they are not currently taking any anticoagulation, have any bleeding history or any family history of bleeding disorders. Patient expressed understanding and wished to proceed. All questions were answered. Sterile technique was used throughout the entire procedure. Please see nursing notes for vital signs. Test dose was given through epidural catheter and negative prior to continuing to dose epidural or start infusion. Warning signs of high block given to the patient including shortness of breath, tingling/numbness in hands, complete motor block,  or any concerning symptoms with instructions to call for help. Patient was given instructions on fall risk and not to get out of bed. All questions and concerns addressed with instructions to call with any issues or inadequate analgesia.  Reason for block:procedure for pain

## 2017-12-11 NOTE — Anesthesia Preprocedure Evaluation (Signed)
Anesthesia Evaluation  Patient identified by MRN, date of birth, ID band Patient awake    Reviewed: Allergy & Precautions, NPO status , Patient's Chart, lab work & pertinent test results  Airway Mallampati: II  TM Distance: >3 FB Neck ROM: Full    Dental no notable dental hx.    Pulmonary neg pulmonary ROS,    Pulmonary exam normal breath sounds clear to auscultation       Cardiovascular negative cardio ROS Normal cardiovascular exam Rhythm:Regular Rate:Normal     Neuro/Psych negative neurological ROS  negative psych ROS   GI/Hepatic Neg liver ROS, GERD  Medicated and Controlled,  Endo/Other  negative endocrine ROS  Renal/GU negative Renal ROS  negative genitourinary   Musculoskeletal negative musculoskeletal ROS (+)   Abdominal   Peds  Hematology  (+) anemia ,   Anesthesia Other Findings   Reproductive/Obstetrics (+) Pregnancy                             Anesthesia Physical Anesthesia Plan  ASA: III  Anesthesia Plan: Epidural   Post-op Pain Management:    Induction:   PONV Risk Score and Plan: Treatment may vary due to age or medical condition  Airway Management Planned: Natural Airway  Additional Equipment:   Intra-op Plan:   Post-operative Plan:   Informed Consent: I have reviewed the patients History and Physical, chart, labs and discussed the procedure including the risks, benefits and alternatives for the proposed anesthesia with the patient or authorized representative who has indicated his/her understanding and acceptance.     Plan Discussed with: Anesthesiologist  Anesthesia Plan Comments: (Patient identified. Risks, benefits, options discussed with patient including but not limited to bleeding, infection, nerve damage, paralysis, failed block, incomplete pain control, headache, blood pressure changes, nausea, vomiting, reactions to medication, itching, and post  partum back pain. Confirmed with bedside nurse the patient's most recent platelet count. Confirmed with the patient that they are not taking any anticoagulation, have any bleeding history or any family history of bleeding disorders. Patient expressed understanding and wishes to proceed. All questions were answered. )        Anesthesia Quick Evaluation

## 2017-12-11 NOTE — Progress Notes (Addendum)
Tara Bradshaw is a 32 y.o. G3P1011 at [redacted]w[redacted]d admitted for active labor  Subjective: Patient feeling contractions and rating them moderate about every 5 minutes but unable to trace with toco. Difficulty tracing fetal heart rate as well. Plan to insert IUPC and place FSE. Patient too uncomfortable with exam to tolerate AROM and placement of monitors at this time. Patient now getting epidural and will recheck and place monitors when patient is comfortable.   Objective: Vitals:   12/11/17 1751 12/11/17 1910 12/11/17 2146 12/11/17 2209  BP: (!) 144/85 124/72  111/77  Pulse: 83 84  89  Resp: 20 16  18   Temp: 98.4 F (36.9 C) 97.8 F (36.6 C) 98 F (36.7 C)   TempSrc: Oral Oral Oral   SpO2: 100%     Weight: 116.6 kg       FHT:  FHR: 140 bpm, variability: moderate,  accelerations:  Present,  decelerations:  Present occasional variable decels with return to baseline UC:   regular, every 5 minutes per patient SVE:   Dilation: 6.5 Effacement (%): 90 Station: -2 Exam by:: Ranee Gosselin, CNM  Labs: Lab Results  Component Value Date   WBC 11.2 (H) 12/11/2017   HGB 9.8 (L) 12/11/2017   HCT 30.2 (L) 12/11/2017   MCV 77.8 (L) 12/11/2017   PLT 202 12/11/2017    Assessment / Plan: Spontaneous labor, progressing normally  Labor: Progressing normally Preeclampsia:  no signs or symptoms of toxicity, intake and ouput balanced and labs stable Fetal Wellbeing:  Category II but overall reassuring tracing Pain Control:  Epidural I/D:  n/a Anticipated MOD:  NSVD  Marikay Alar 12/11/2017, 11:11 PM   AROM clear. FSE and IUPC placed. Will monitor patient's contraction pattern now that we have AROMed and will start pitocin if not adequate.  Marikay Alar 12/12/17 12:13 AM

## 2017-12-11 NOTE — MAU Note (Signed)
Been having pains in her lower back and pressure in her bottom, about 7 min apart now.  Been going on all day.

## 2017-12-12 ENCOUNTER — Encounter (HOSPITAL_COMMUNITY)
Admission: AD | Disposition: A | Discharge status: Home / Self Care | Payer: Self-pay | Source: Home / Self Care | Attending: Obstetrics and Gynecology

## 2017-12-12 ENCOUNTER — Encounter (HOSPITAL_COMMUNITY): Payer: Self-pay | Admitting: Obstetrics and Gynecology

## 2017-12-12 LAB — RPR: RPR Ser Ql: NONREACTIVE

## 2017-12-12 SURGERY — LIGATION, FALLOPIAN TUBE, POSTPARTUM
Anesthesia: Choice

## 2017-12-12 MED ORDER — WITCH HAZEL-GLYCERIN EX PADS: 1.0000 "application " | MEDICATED_PAD | CUTANEOUS | Status: DC | PRN

## 2017-12-12 MED ORDER — DIBUCAINE 1 % RE OINT: 1.0000 "application " | TOPICAL_OINTMENT | RECTAL | Status: DC | PRN

## 2017-12-12 MED ORDER — COCONUT OIL OIL: 1.0000 "application " | TOPICAL_OIL | Status: DC | PRN

## 2017-12-12 MED ORDER — DIPHENHYDRAMINE HCL 25 MG PO CAPS: 25.0000 mg | ORAL_CAPSULE | Freq: Four times a day (QID) | ORAL | Status: DC | PRN

## 2017-12-12 MED ORDER — ONDANSETRON HCL 4 MG/2ML IJ SOLN: 4.0000 mg | INTRAMUSCULAR | Status: DC | PRN

## 2017-12-12 MED ORDER — FAMOTIDINE 20 MG PO TABS: 40.0000 mg | ORAL_TABLET | Freq: Once | ORAL | Status: DC

## 2017-12-12 MED ORDER — ZOLPIDEM TARTRATE 5 MG PO TABS: 5.0000 mg | ORAL_TABLET | Freq: Every evening | ORAL | Status: DC | PRN

## 2017-12-12 MED ORDER — BENZOCAINE-MENTHOL 20-0.5 % EX AERO: 1.0000 "application " | INHALATION_SPRAY | CUTANEOUS | Status: DC | PRN

## 2017-12-12 MED ORDER — TETANUS-DIPHTH-ACELL PERTUSSIS 5-2.5-18.5 LF-MCG/0.5 IM SUSP: 0.5000 mL | Freq: Once | INTRAMUSCULAR | Status: DC

## 2017-12-12 MED ORDER — IBUPROFEN 600 MG PO TABS
600.0000 mg | ORAL_TABLET | Freq: Four times a day (QID) | ORAL | Status: DC
  Administered 2017-12-12 – 2017-12-13 (×4): 600 mg via ORAL
  Filled 2017-12-12 (×4): qty 1

## 2017-12-12 MED ORDER — SENNOSIDES-DOCUSATE SODIUM 8.6-50 MG PO TABS
2.0000 | ORAL_TABLET | ORAL | Status: DC
  Administered 2017-12-12: 2 via ORAL
  Filled 2017-12-12: qty 2

## 2017-12-12 MED ORDER — ONDANSETRON HCL 4 MG PO TABS: 4.0000 mg | ORAL_TABLET | ORAL | Status: DC | PRN

## 2017-12-12 MED ORDER — ACETAMINOPHEN 325 MG PO TABS: 650.0000 mg | ORAL_TABLET | ORAL | Status: DC | PRN

## 2017-12-12 MED ORDER — SIMETHICONE 80 MG PO CHEW: 80.0000 mg | CHEWABLE_TABLET | ORAL | Status: DC | PRN

## 2017-12-12 MED ORDER — PRENATAL MULTIVITAMIN CH
1.0000 | ORAL_TABLET | Freq: Every day | ORAL | Status: DC
  Administered 2017-12-12: 1 via ORAL
  Filled 2017-12-12: qty 1

## 2017-12-12 MED ORDER — LACTATED RINGERS IV SOLN: INTRAVENOUS | Status: DC

## 2017-12-12 MED ORDER — METOCLOPRAMIDE HCL 10 MG PO TABS: 10.0000 mg | ORAL_TABLET | Freq: Once | ORAL | Status: DC

## 2017-12-12 MED ORDER — LACTATED RINGERS AMNIOINFUSION
INTRAVENOUS | Status: DC
  Filled 2017-12-12: qty 1000

## 2017-12-12 NOTE — Progress Notes (Signed)
Patient admitted to Rm 112; call light within reach and safety measures addressed as well as admission packet and handbook.  Food ordered and delivered; infant swaddled and in the crib while patient eats.

## 2017-12-12 NOTE — Progress Notes (Signed)
Tara Bradshaw is a 32 y.o. G3P1011 at [redacted]w[redacted]d admitted for active labor  Subjective: Patient able to sleep after epidural medication increased per anesthesia. Patient has a left cervical rim, with practice push patient not able to push past it. Will wait to push until complete.   Objective: Vitals:   12/12/17 0401 12/12/17 0402 12/12/17 0431 12/12/17 0433  BP:  137/72  135/71  Pulse:  81  70  Resp:  18    Temp:  98 F (36.7 C)    TempSrc:      SpO2: 100%  100%   Weight:        FHT:  FHR: 140 bpm, variability: marked,  accelerations:  Present,  decelerations:  Present variable decels, mild to moderate, with return to baseline UC:  irregular, every 2-4 minutes per patient SVE:   Dilation: Lip/rim Effacement (%): 100 Station: 0 Exam by:: Wynn Banker CNM  Labs: Lab Results  Component Value Date   WBC 11.2 (H) 12/11/2017   HGB 9.8 (L) 12/11/2017   HCT 30.2 (L) 12/11/2017   MCV 77.8 (L) 12/11/2017   PLT 202 12/11/2017    Assessment / Plan: Spontaneous labor, progressing normally  Labor: Progressing normally Preeclampsia:  no signs or symptoms of toxicity, intake and ouput balanced and labs stable Fetal Wellbeing:  Category II but overall reassuring tracing Pain Control:  Epidural I/D:  n/a Anticipated MOD:  NSVD   Marikay Alar 12/12/17 4:57 AM

## 2017-12-12 NOTE — Anesthesia Postprocedure Evaluation (Signed)
Anesthesia Post Note  Patient: Tara Bradshaw  Procedure(s) Performed: AN AD HOC LABOR EPIDURAL     Patient location during evaluation: Mother Baby Anesthesia Type: Epidural Level of consciousness: awake and alert and oriented Pain management: satisfactory to patient Vital Signs Assessment: post-procedure vital signs reviewed and stable Respiratory status: spontaneous breathing and nonlabored ventilation Cardiovascular status: stable Postop Assessment: no headache, no backache, no signs of nausea or vomiting, adequate PO intake, patient able to bend at knees and able to ambulate (patient up walking) Anesthetic complications: no Comments: Epidural catheter remains in for PPTL scheduled 12/13/2017    Last Vitals:  Vitals:   12/12/17 0830 12/12/17 1210  BP: 125/82 (!) 126/51  Pulse: 87 91  Resp: 17   Temp: 36.7 C 36.6 C  SpO2: 100%     Last Pain:  Vitals:   12/12/17 1210  TempSrc: Axillary  PainSc: 0-No pain   Pain Goal:                 Tara Bradshaw

## 2017-12-12 NOTE — Plan of Care (Deleted)
Patient admitted to Rm 112; call light within reach and safety measures reviewed.  Instructed patient to call before getting out of bed for the first time; food ordered.  Infant swaddled in crib while patient eats at the time that the RN left the room.

## 2017-12-12 NOTE — Progress Notes (Signed)
Due to pt not getting BTL until 6 week follow up visit, RN called L&D to have pt's epidural removed per request. Pt stable.
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
## 2017-12-12 NOTE — Progress Notes (Signed)
Patient requested to eat breakfast this morning after being admitted to [facility] since she "had not eaten in over 24 hours."  Patient ate at 0830- tubal had not been scheduled yet at this time.  Tubal was then scheduled for 1130 and canceled due to patient having eaten in the past 8 hours.  RN held patient off from eating again until we had an update about the tubal; patient requesting to eat at 1200, stating that she "could not wait another 4 hours to have surgery and not eat."  She requested to have tubal in the morning so that she could go without eating over night.  RN assured patient she would call and find out.  Spoke with MD on the phone who stated that she was not sure if the oncoming MD would be able to do it in the morning and so to tell the patient she can come back in 6 weeks to have it done and can receive a depot shot in the meantime if needed.  This way she can eat without having to worry.  Will update patient and continue to monitor.

## 2017-12-12 NOTE — Progress Notes (Addendum)
Tara Bradshaw is a 32 y.o. G3P1011 at [redacted]w[redacted]d admitted for active labor  Subjective: Patient feeling lots of pressure in her bottom. Baby was having occasional mild to moderate variables but then began having deeper variable decels. While RN was repositioning, FSE came off and was replaced. Variability is now marked with occasional deeper variable decels. Dr. Alesia Richards consulted and reviewed strip. Amnioinfusion started. Dr. Alesia Richards is on her way to the hospital to be present at delivery if needed.   Objective: Vitals:   12/12/17 0007 12/12/17 0013 12/12/17 0018 12/12/17 0021  BP: 100/61 98/67 (!) 89/48 107/60  Pulse: 85 99 69 89  Resp:      Temp:      TempSrc:      SpO2:      Weight:        FHT:  FHR: 140 bpm, variability: marked,  accelerations:  Present,  decelerations:  Present variable decels, mild to moderate, with return to baseline UC:   regular, every 5 minutes per patient SVE:   Dilation: 9 Effacement (%): 100 Station: 0 Exam by:: Egreer, CNM  Labs: Lab Results  Component Value Date   WBC 11.2 (H) 12/11/2017   HGB 9.8 (L) 12/11/2017   HCT 30.2 (L) 12/11/2017   MCV 77.8 (L) 12/11/2017   PLT 202 12/11/2017    Assessment / Plan: Spontaneous labor, progressing normally  Labor: Progressing normally Preeclampsia:  no signs or symptoms of toxicity, intake and ouput balanced and labs stable Fetal Wellbeing:  Category II but overall reassuring tracing Pain Control:  Epidural I/D:  n/a Anticipated MOD:  NSVD  Tara Bradshaw 12/12/2017, 1:44 AM  I agree with above findings, assessment and plan per CNM Ranee Gosselin.  Upon my arrival I reviewed EFM that showed baseline 150, moderate variability, reactive.  With occasional variable decelerations. No contractions traced.  Will continue with close monitoring. Dr. Alesia Richards. 12/12/2017 0216.   RN called me to bedside stating IUPC came out and patient was starting to have deep variable decelerations again. IUPC replaced and amnioinfusion  restarted. Now tracing contractions and variable decels are improving, now mild.   Tara Bradshaw 12/12/17 2:57 AM

## 2017-12-13 DIAGNOSIS — O9081 Anemia of the puerperium: Secondary | ICD-10-CM

## 2017-12-13 LAB — CBC
HEMATOCRIT: 26.2 % — AB (ref 36.0–46.0)
Hemoglobin: 8.7 g/dL — ABNORMAL LOW (ref 12.0–15.0)
MCH: 26 pg (ref 26.0–34.0)
MCHC: 33.2 g/dL (ref 30.0–36.0)
MCV: 78.4 fL (ref 78.0–100.0)
PLATELETS: 180 10*3/uL (ref 150–400)
RBC: 3.34 MIL/uL — ABNORMAL LOW (ref 3.87–5.11)
RDW: 14.5 % (ref 11.5–15.5)
WBC: 10.2 10*3/uL (ref 4.0–10.5)

## 2017-12-13 MED ORDER — IBUPROFEN 600 MG PO TABS: 600.0000 mg | ORAL_TABLET | Freq: Four times a day (QID) | ORAL | 0 refills | Status: DC

## 2017-12-13 NOTE — Discharge Summary (Signed)
SVD OB Discharge Summary     Patient Name: Tara Bradshaw DOB: 30-Oct-1985 MRN: [id number]  Date of admission: 12/11/2017 Delivering MD: Ranee Gosselin K  Date of delivery: 12/12/2017 Type of delivery: SVD  Newborn Data: Sex: BG Live born female  Birth Weight: 6 lb 13.7 oz (3110 g) APGAR: 6, 9  Newborn Delivery   Birth date/time:  12/12/2017 05:43:00 Delivery type:  Vaginal, Spontaneous     Feeding: breast Infant being discharge to home with mother in stable condition.   Admitting diagnosis: CTX Intrauterine pregnancy: [redacted]w[redacted]d     Secondary diagnosis:  Active Problems:   Indication for care in labor or delivery   Anemia, postpartum   Normal postpartum course                                Complications: None GBS+                                                              Intrapartum Procedures: spontaneous vaginal delivery Postpartum Procedures: none Complications-Operative and Postpartum: none and minro non-repaird bilateraly labial tears Augmentation: AROM and Pitocin   History of Present Illness: Tara Bradshaw is a 32 y.o. female, [id number], who presents at [redacted]w[redacted]d weeks gestation. The patient has been followed at  [facility]  Her pregnancy has been complicated by: GBS+ Patient Active Problem List   Diagnosis Date Noted  . Anemia, postpartum 12/13/2017  . Normal postpartum course 12/13/2017  . Indication for care in labor or delivery 12/11/2017  . Hyperemesis affecting pregnancy, antepartum 07/25/2017  . Dehydration during pregnancy 07/23/2017  . Obesity, Class III, BMI 40-49.9 (morbid obesity) ([place]) 11/26/2016  . GERD (gastroesophageal reflux disease) 11/26/2016    Hospital course:  Onset of Labor With Vaginal Delivery     32 y.o. yo [id number] at [redacted]w[redacted]d was admitted in Latent Labor on 12/11/2017. Patient had an uncomplicated labor course as follows:  Membrane Rupture Time/Date: 11:56 PM ,12/12/2017   Intrapartum Procedures:  Episiotomy: None [1]                                         Lacerations:  None [1]  Patient had a delivery of a Viable infant. 12/12/2017  Information for the patient's newborn:  Beckey, Polkowski Girl Collen [[id number]]  Delivery Method: Vag-Spont    Pateint had an uncomplicated postpartum course.  She is ambulating, tolerating a regular diet, passing flatus, and urinating well. Patient is discharged home in stable condition on 12/13/17.  Postpartum Day # 1 : S/P NSVD due to spontaneous labor with GBS + and treated with penicillin. Pt awake in bed bonding with infant now and breastfeeding well. Patient up ad lib, denies syncope or dizziness. Reports consuming regular diet without issues and denies N/V. Patient reports 0 bowel movement + passing flatus.  Denies issues with urination and reports bleeding is "light."  Patient is Breastfeeding and reports going well.  Desires 6wks PPV with BTL for postpartum contraception.  Pain is being appropriately managed with use of motrin. Pt is anemic and showing or declined s/sx. Pt verbalized she is  ready to go home today.   Physical exam  Vitals:   12/12/17 1210 12/12/17 1600 12/12/17 2112 12/13/17 0550  BP: (!) 126/51 130/80 126/77 102/67  Pulse: 91 83 86 76  Resp:  16 20 18   Temp: 97.9 F (36.6 C) 98.2 F (36.8 C) 98.5 F (36.9 C) 98.4 F (36.9 C)  TempSrc: Axillary Oral Oral Oral  SpO2:   100%   Weight:       General: alert, cooperative and no distress Lochia: appropriate Uterine Fundus: firm Perineum: no erythema, no hematomas present, healing well and approximate.  DVT Evaluation: No evidence of DVT seen on physical exam. Negative Homan's sign. No cords or calf tenderness. No significant calf/ankle edema.  Labs: Lab Results  Component Value Date   WBC 10.2 12/13/2017   HGB 8.7 (L) 12/13/2017   HCT 26.2 (L) 12/13/2017   MCV 78.4 12/13/2017   PLT 180 12/13/2017   CMP Latest Ref Rng & Units 12/11/2017  Glucose 70 - 99 mg/dL 92  BUN 6 - 20  mg/dL <5(L)  Creatinine 0.44 - 1.00 mg/dL 0.34(L)  Sodium 135 - 145 mmol/L 133(L)  Potassium 3.5 - 5.1 mmol/L 3.7  Chloride 98 - 111 mmol/L 104  CO2 22 - 32 mmol/L 20(L)  Calcium 8.9 - 10.3 mg/dL 9.4  Total Protein 6.5 - 8.1 g/dL 6.4(L)  Total Bilirubin 0.3 - 1.2 mg/dL 0.3  Alkaline Phos 38 - 126 U/L 93  AST 15 - 41 U/L 17  ALT 0 - 44 U/L 9    Date of discharge: 12/13/2017 Discharge Diagnoses: Term Pregnancy-delivered Discharge instruction: per After Visit Summary and "Baby and Me Booklet".  After visit meds:  Allergies as of 12/13/2017   No Known Allergies     Medication List    TAKE these medications   flintstones complete 60 MG chewable tablet Chew 1 tablet by mouth daily.   ibuprofen 600 MG tablet Commonly known as:  ADVIL,MOTRIN Take 1 tablet (600 mg total) by mouth every 6 (six) hours.       Activity:           pelvic rest Advance as tolerated. Pelvic rest for 6 weeks.  Diet:                routine Medications: PNV, Ibuprofen, Colace and Iron Postpartum contraception: Tubal Ligation Condition:  Pt discharge to home with baby in stable Anemia: to take IRON at home with vit C, avoid calcium with taking iron. OTC Twice daily.   Meds: Allergies as of 12/13/2017   No Known Allergies     Medication List    TAKE these medications   flintstones complete 60 MG chewable tablet Chew 1 tablet by mouth daily.   ibuprofen 600 MG tablet Commonly known as:  ADVIL,MOTRIN Take 1 tablet (600 mg total) by mouth every 6 (six) hours.       Discharge Follow Up:  [facility] Follow up in 6 week(s).   Specialty:  Obstetrics and Gynecology Why:  PPV and schedule BTL Contact information: [address] [place] [id number] [place], NP-C, [place] 12/13/2017, 8:20 AM  Noralyn Pick, FNP
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
## 2017-12-14 ENCOUNTER — Inpatient Hospital Stay (HOSPITAL_COMMUNITY): Admission: RE | Admit: 2017-12-14 | Payer: Medicaid Other | Source: Ambulatory Visit

## 2017-12-18 ENCOUNTER — Other Ambulatory Visit: Payer: Self-pay | Admitting: Obstetrics and Gynecology

## 2018-01-16 NOTE — H&P (Signed)
Tara Bradshaw is a 32 y.o.  female,  P: 2-0-1-2 who presents  for permanent sterilization because of her desire to end fertility.  Patient is S/P a term vaginal delivery 08/14/996 without complications.  In the past her menstrual periods have been regular, lasting 5 days with a pad change every 3 hours.  She admits to having cramps, rated 8/10 on a 10 point pain scale but they are relieved by Tylenol or Midol.  She denies any change in bowel or bladder function. In the past the patient has used condoms, Nexplanon and oral contraceptives for pregnancy prevention however now she is requesting tubal sterilization.    Past Medical History   OB History: G: 3;  P: 2-0-1-2;  SVB 2014 and 2019  GYN History: menarche: 32 YO    LMP: Post Partum    Contraception: condoms;  Denies history of abnormal PAP smear or sexually transmitted diseases.   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  ROS:  Denies corrective lenses, dentures,   headache, vision changes, nasal congestion, dysphagia, tinnitus, dizziness, hoarseness, cough,  chest pain, shortness of breath, nausea, vomiting, diarrhea,constipation,  urinary frequency, urgency  dysuria, hematuria, vaginitis symptoms, pelvic pain, swelling of joints,easy bruising,  myalgias, arthralgias, skin rashes, unexplained weight loss and except as is mentioned in the history of present illness, patient's review of systems is otherwise negative.     Physical Exam  Bp: 128/88  P: 75 bpm  R:  16  Temperature: 98.6 degrees F orally  Weight: 241 lbs.  Height: 5'2"  BMI: 44.1  Neck: supple without masses or thyromegaly Lungs: clear to auscultation Heart: regular rate and rhythm Abdomen: soft, non-tender and no organomegaly Pelvic:EGBUS- wnl; vagina-normal rugae; uterus-normal size, cervix without lesions or motion tenderness; adnexae-no tenderness or masses Extremities:  no clubbing, cyanosis or edema   Assesment: Desire for Sterilization   Disposition:  A discussion was held with patient regarding the indication for her procedure(s) along with the risks, which include but are not limited to: Reviewed the risks of surgery to include, but not limited to: reaction to anesthesia, damage to adjacent organs, infection and excessive bleeding. The patient verbalized understanding of these risks and has consented to proceed with a Laparoscopic Tubal Sterilization at [facility] on January 23, 2018 at 12:15 p.m.   CSN# [id number]   Valyn Latchford J. Florene Glen, PA-C  for Dr. Harvie Bridge. Mancel Bale

## 2018-01-22 DIAGNOSIS — Z113 Encounter for screening for infections with a predominantly sexual mode of transmission: Secondary | ICD-10-CM | POA: Diagnosis not present

## 2018-01-22 DIAGNOSIS — D649 Anemia, unspecified: Secondary | ICD-10-CM | POA: Diagnosis not present

## 2018-01-23 ENCOUNTER — Ambulatory Visit (HOSPITAL_COMMUNITY)
Admission: RE | Admit: 2018-01-23 | Payer: Medicaid Other | Source: Ambulatory Visit | Admitting: Obstetrics and Gynecology

## 2018-01-23 SURGERY — LIGATION, FALLOPIAN TUBE, LAPAROSCOPIC
Anesthesia: General | Laterality: Bilateral

## 2018-01-27 DIAGNOSIS — Z30430 Encounter for insertion of intrauterine contraceptive device: Secondary | ICD-10-CM | POA: Diagnosis not present

## 2018-06-27 ENCOUNTER — Other Ambulatory Visit: Payer: Self-pay

## 2018-06-27 ENCOUNTER — Ambulatory Visit (HOSPITAL_COMMUNITY)
Admission: EM | Admit: 2018-06-27 | Discharge: 2018-06-27 | Disposition: A | Discharge status: Home / Self Care | Payer: 59 | Attending: Family Medicine | Admitting: Family Medicine

## 2018-06-27 ENCOUNTER — Encounter (HOSPITAL_COMMUNITY): Payer: Self-pay

## 2018-06-27 DIAGNOSIS — J22 Unspecified acute lower respiratory infection: Secondary | ICD-10-CM

## 2018-06-27 DIAGNOSIS — R05 Cough: Secondary | ICD-10-CM | POA: Diagnosis not present

## 2018-06-27 MED ORDER — AMOXICILLIN 875 MG PO TABS: 875.0000 mg | ORAL_TABLET | Freq: Two times a day (BID) | ORAL | 0 refills | Status: DC

## 2018-06-27 MED ORDER — BENZONATATE 200 MG PO CAPS: 200.0000 mg | ORAL_CAPSULE | Freq: Two times a day (BID) | ORAL | 0 refills | Status: DC | PRN

## 2018-06-27 NOTE — Discharge Instructions (Addendum)
Drink plenty of fluids Take the amoxicillin antibiotic 2 times a day Take 2 doses today Take Tessalon twice a day for cough Use a humidifier if you have one Return if not better in a week

## 2018-06-27 NOTE — ED Triage Notes (Signed)
Pt present cough, nasal and chest congestion. Pt states symptoms started 2 days ago.  Pt states she has had tried OTC medication with no relief.

## 2018-06-27 NOTE — ED Provider Notes (Signed)
[facility]    CSN: [id number] Arrival date & time: 06/27/18  6659     History   Chief Complaint Chief Complaint  Patient presents with  . Cough  . Nasal Congestion    HPI Tara Bradshaw is a 33 y.o. female.   HPI  Patient states she has been sick for 2 weeks.  Started off as a head cold.  Then she had a lot of postnasal drip.  She started having coughing and chest congestion.  She is taking over-the-counter medicines.  Over the last 2 days the sputum is turned thick brown color.  She is feeling more tired.  She states that she thinks she has "infection" that needs antibiotic.  No sweats chills or fever for the last week.  No known exposure to illness.  She does work in a nursing home.  Past Medical History:  Diagnosis Date  . Anemia    CHRONIC  . GERD (gastroesophageal reflux disease)    WITH PREGNANCY  . Pneumonia    x 1    Patient Active Problem List   Diagnosis Date Noted  . Anemia, postpartum 12/13/2017  . Normal postpartum course 12/13/2017  . Indication for care in labor or delivery 12/11/2017  . Hyperemesis affecting pregnancy, antepartum 07/25/2017  . Dehydration during pregnancy 07/23/2017  . Obesity, Class III, BMI 40-49.9 (morbid obesity) ([place]) 11/26/2016  . GERD (gastroesophageal reflux disease) 11/26/2016    Past Surgical History:  Procedure Laterality Date  . DILATION AND EVACUATION N/A 11/30/2016   Procedure: DILATATION AND EVACUATION;  Surgeon: Donnamae Jude, MD;  Location: [facility];  Service: Gynecology;  Laterality: N/A;  . WISDOM TOOTH EXTRACTION     NO ANESTHESIA    OB History    Gravida  3   Para  2   Term  2   Preterm      AB  1   Living  2     SAB  1   TAB      Ectopic      Multiple  0   Live Births  2        Obstetric Comments  G2- D&C for missed AB         Home Medications    Prior to Admission medications   Medication Sig Start Date End Date Taking? Authorizing Provider  amoxicillin (AMOXIL)  875 MG tablet Take 1 tablet (875 mg total) by mouth 2 (two) times daily for 10 days. 06/27/18 07/07/18  Raylene Everts, MD  benzonatate (TESSALON) 200 MG capsule Take 1 capsule (200 mg total) by mouth 2 (two) times daily as needed for cough. 06/27/18   Raylene Everts, MD    Family History Family History  Problem Relation Age of Onset  . Epilepsy Mother   . Asthma Mother   . Cancer Paternal Grandmother        BREAST  . Cancer Maternal Aunt 52       BREAST  . Hypertension Maternal Grandmother     Social History Social History   Tobacco Use  . Smoking status: Never Smoker  . Smokeless tobacco: Never Used  Substance Use Topics  . Alcohol use: No  . Drug use: No     Allergies   Patient has no known allergies.   Review of Systems Review of Systems  Constitutional: Positive for fatigue. Negative for chills and fever.  HENT: Positive for congestion, postnasal drip and rhinorrhea. Negative for ear pain and sore throat.  Eyes: Negative for pain and visual disturbance.  Respiratory: Positive for cough. Negative for shortness of breath.   Cardiovascular: Negative for chest pain and palpitations.  Gastrointestinal: Negative for abdominal pain and vomiting.  Genitourinary: Negative for dysuria and hematuria.  Musculoskeletal: Negative for arthralgias and back pain.  Skin: Negative for color change and rash.  Neurological: Negative for seizures and syncope.  All other systems reviewed and are negative.    Physical Exam Triage Vital Signs ED Triage Vitals  Enc Vitals Group     BP 06/27/18 0850 (!) 125/92     Pulse Rate 06/27/18 0850 (!) 110     Resp 06/27/18 0850 18     Temp 06/27/18 0850 98.2 F (36.8 C)     Temp Source 06/27/18 0850 Oral     SpO2 06/27/18 0850 99 %   No data found.  Updated Vital Signs BP (!) 125/92 (BP Location: Right Arm)   Pulse (!) 110   Temp 98.2 F (36.8 C) (Oral)   Resp 18   LMP 06/27/2018   SpO2 99%       Physical Exam  Constitutional:      General: She is not in acute distress.    Appearance: She is well-developed.  HENT:     Head: Normocephalic and atraumatic.     Right Ear: Tympanic membrane, ear canal and external ear normal.     Left Ear: Tympanic membrane, ear canal and external ear normal.     Nose: Congestion present.     Mouth/Throat:     Mouth: Mucous membranes are moist.  Eyes:     Conjunctiva/sclera: Conjunctivae normal.     Pupils: Pupils are equal, round, and reactive to light.  Neck:     Musculoskeletal: Normal range of motion.  Cardiovascular:     Rate and Rhythm: Normal rate and regular rhythm.     Pulses: Normal pulses.  Pulmonary:     Effort: Pulmonary effort is normal. No respiratory distress.     Breath sounds: Rales present.     Comments: Rhonchi centrally.  Rales both bases Abdominal:     General: There is no distension.     Palpations: Abdomen is soft.  Musculoskeletal: Normal range of motion.  Lymphadenopathy:     Cervical: Cervical adenopathy present.  Skin:    General: Skin is warm and dry.  Neurological:     General: No focal deficit present.     Mental Status: She is alert.      UC Treatments / Results  Labs (all labs ordered are listed, but only abnormal results are displayed) Labs Reviewed - No data to display  EKG None  Radiology No results found.  Procedures Procedures (including critical care time)  Medications Ordered in UC Medications - No data to display  Initial Impression / Assessment and Plan / UC Course  I have reviewed the triage vital signs and the nursing notes.  Pertinent labs & imaging results that were available during my care of the patient were reviewed by me and considered in my medical decision making (see chart for details).     Discussed that this likely started out as a virus but with rales in her lung bases and a change in her sputum, I believe an antibiotic is reasonable at this point Final Clinical Impressions(s)  / UC Diagnoses   Final diagnoses:  LRTI (lower respiratory tract infection)     Discharge Instructions     Drink plenty of fluids Take the amoxicillin  antibiotic 2 times a day Take 2 doses today Take Tessalon twice a day for cough Use a humidifier if you have one Return if not better in a week   ED Prescriptions    Medication Sig Dispense Auth. Provider   amoxicillin (AMOXIL) 875 MG tablet Take 1 tablet (875 mg total) by mouth 2 (two) times daily for 10 days. 20 tablet Raylene Everts, MD   benzonatate (TESSALON) 200 MG capsule Take 1 capsule (200 mg total) by mouth 2 (two) times daily as needed for cough. 20 capsule Raylene Everts, MD     Controlled Substance Prescriptions [place] Controlled Substance Registry consulted? Not Applicable   Raylene Everts, MD 06/27/18 [phone]

## 2018-06-27 NOTE — ED Notes (Signed)
Patient verbalizes understanding of discharge instructions. Opportunity for questioning and answers were provided. Patient discharged from [facility] by RN.  

## 2018-07-03 ENCOUNTER — Inpatient Hospital Stay (HOSPITAL_COMMUNITY): Payer: Medicaid Other

## 2018-07-03 ENCOUNTER — Inpatient Hospital Stay (HOSPITAL_COMMUNITY)
Admission: EM | Admit: 2018-07-03 | Discharge: 2018-07-05 | DRG: 291 | Disposition: A | Discharge status: Home / Self Care | Payer: Medicaid Other | Attending: Internal Medicine | Admitting: Internal Medicine

## 2018-07-03 ENCOUNTER — Emergency Department (HOSPITAL_COMMUNITY): Payer: Medicaid Other

## 2018-07-03 ENCOUNTER — Other Ambulatory Visit: Payer: Self-pay

## 2018-07-03 ENCOUNTER — Encounter (HOSPITAL_COMMUNITY): Payer: Self-pay | Admitting: Emergency Medicine

## 2018-07-03 DIAGNOSIS — B9789 Other viral agents as the cause of diseases classified elsewhere: Secondary | ICD-10-CM | POA: Diagnosis not present

## 2018-07-03 DIAGNOSIS — Z825 Family history of asthma and other chronic lower respiratory diseases: Secondary | ICD-10-CM | POA: Diagnosis not present

## 2018-07-03 DIAGNOSIS — Z20828 Contact with and (suspected) exposure to other viral communicable diseases: Secondary | ICD-10-CM | POA: Diagnosis present

## 2018-07-03 DIAGNOSIS — O9081 Anemia of the puerperium: Secondary | ICD-10-CM | POA: Diagnosis not present

## 2018-07-03 DIAGNOSIS — I509 Heart failure, unspecified: Secondary | ICD-10-CM | POA: Diagnosis not present

## 2018-07-03 DIAGNOSIS — R0602 Shortness of breath: Secondary | ICD-10-CM | POA: Diagnosis not present

## 2018-07-03 DIAGNOSIS — Z6841 Body Mass Index (BMI) 40.0 and over, adult: Secondary | ICD-10-CM

## 2018-07-03 DIAGNOSIS — O903 Peripartum cardiomyopathy: Secondary | ICD-10-CM | POA: Diagnosis present

## 2018-07-03 DIAGNOSIS — E02 Subclinical iodine-deficiency hypothyroidism: Secondary | ICD-10-CM | POA: Diagnosis not present

## 2018-07-03 DIAGNOSIS — R05 Cough: Secondary | ICD-10-CM | POA: Diagnosis not present

## 2018-07-03 DIAGNOSIS — Z809 Family history of malignant neoplasm, unspecified: Secondary | ICD-10-CM | POA: Diagnosis not present

## 2018-07-03 DIAGNOSIS — E039 Hypothyroidism, unspecified: Secondary | ICD-10-CM | POA: Diagnosis present

## 2018-07-03 DIAGNOSIS — E0590 Thyrotoxicosis, unspecified without thyrotoxic crisis or storm: Secondary | ICD-10-CM | POA: Diagnosis present

## 2018-07-03 DIAGNOSIS — R748 Abnormal levels of other serum enzymes: Secondary | ICD-10-CM | POA: Diagnosis present

## 2018-07-03 DIAGNOSIS — G4733 Obstructive sleep apnea (adult) (pediatric): Secondary | ICD-10-CM | POA: Diagnosis present

## 2018-07-03 DIAGNOSIS — Z79899 Other long term (current) drug therapy: Secondary | ICD-10-CM | POA: Diagnosis not present

## 2018-07-03 DIAGNOSIS — D649 Anemia, unspecified: Secondary | ICD-10-CM | POA: Diagnosis not present

## 2018-07-03 DIAGNOSIS — I5021 Acute systolic (congestive) heart failure: Principal | ICD-10-CM | POA: Diagnosis present

## 2018-07-03 DIAGNOSIS — K219 Gastro-esophageal reflux disease without esophagitis: Secondary | ICD-10-CM | POA: Diagnosis present

## 2018-07-03 DIAGNOSIS — D509 Iron deficiency anemia, unspecified: Secondary | ICD-10-CM | POA: Diagnosis present

## 2018-07-03 DIAGNOSIS — J9601 Acute respiratory failure with hypoxia: Secondary | ICD-10-CM | POA: Diagnosis not present

## 2018-07-03 DIAGNOSIS — Z820 Family history of epilepsy and other diseases of the nervous system: Secondary | ICD-10-CM | POA: Diagnosis not present

## 2018-07-03 DIAGNOSIS — E876 Hypokalemia: Secondary | ICD-10-CM | POA: Diagnosis present

## 2018-07-03 DIAGNOSIS — R740 Nonspecific elevation of levels of transaminase and lactic acid dehydrogenase [LDH]: Secondary | ICD-10-CM | POA: Diagnosis not present

## 2018-07-03 DIAGNOSIS — J069 Acute upper respiratory infection, unspecified: Secondary | ICD-10-CM | POA: Diagnosis present

## 2018-07-03 DIAGNOSIS — Z8249 Family history of ischemic heart disease and other diseases of the circulatory system: Secondary | ICD-10-CM | POA: Diagnosis not present

## 2018-07-03 DIAGNOSIS — R946 Abnormal results of thyroid function studies: Secondary | ICD-10-CM | POA: Diagnosis not present

## 2018-07-03 DIAGNOSIS — I493 Ventricular premature depolarization: Secondary | ICD-10-CM | POA: Diagnosis not present

## 2018-07-03 LAB — CBC WITH DIFFERENTIAL/PLATELET
Abs Immature Granulocytes: 0.02 10*3/uL (ref 0.00–0.07)
Basophils Absolute: 0 10*3/uL (ref 0.0–0.1)
Basophils Relative: 0 %
EOS PCT: 2 %
Eosinophils Absolute: 0.2 10*3/uL (ref 0.0–0.5)
HCT: 36 % (ref 36.0–46.0)
Hemoglobin: 10.9 g/dL — ABNORMAL LOW (ref 12.0–15.0)
Immature Granulocytes: 0 %
Lymphocytes Relative: 31 %
Lymphs Abs: 3.1 10*3/uL (ref 0.7–4.0)
MCH: 24.4 pg — ABNORMAL LOW (ref 26.0–34.0)
MCHC: 30.3 g/dL (ref 30.0–36.0)
MCV: 80.7 fL (ref 80.0–100.0)
Monocytes Absolute: 0.5 10*3/uL (ref 0.1–1.0)
Monocytes Relative: 5 %
Neutro Abs: 6.3 10*3/uL (ref 1.7–7.7)
Neutrophils Relative %: 62 %
Platelets: 447 10*3/uL — ABNORMAL HIGH (ref 150–400)
RBC: 4.46 MIL/uL (ref 3.87–5.11)
RDW: 15.2 % (ref 11.5–15.5)
WBC: 10.1 10*3/uL (ref 4.0–10.5)
nRBC: 0 % (ref 0.0–0.2)

## 2018-07-03 LAB — I-STAT BETA HCG BLOOD, ED (MC, WL, AP ONLY): I-stat hCG, quantitative: <5 m[IU]/mL (ref <5)

## 2018-07-03 LAB — RESPIRATORY PANEL BY PCR
Adenovirus: NOT DETECTED
Bordetella pertussis: NOT DETECTED
CHLAMYDOPHILA PNEUMONIAE-RVPPCR: NOT DETECTED
Coronavirus 229E: NOT DETECTED
Coronavirus HKU1: NOT DETECTED
Coronavirus NL63: NOT DETECTED
Coronavirus OC43: NOT DETECTED
INFLUENZA B-RVPPCR: NOT DETECTED
Influenza A: NOT DETECTED
Metapneumovirus: NOT DETECTED
Mycoplasma pneumoniae: NOT DETECTED
PARAINFLUENZA VIRUS 3-RVPPCR: NOT DETECTED
Parainfluenza Virus 1: NOT DETECTED
Parainfluenza Virus 2: NOT DETECTED
Parainfluenza Virus 4: NOT DETECTED
Respiratory Syncytial Virus: NOT DETECTED
Rhinovirus / Enterovirus: DETECTED — AB

## 2018-07-03 LAB — COMPREHENSIVE METABOLIC PANEL
ALT: 62 U/L — AB (ref 0–44)
AST: 48 U/L — ABNORMAL HIGH (ref 15–41)
Albumin: 3.7 g/dL (ref 3.5–5.0)
Alkaline Phosphatase: 70 U/L (ref 38–126)
Anion gap: 11 (ref 5–15)
BUN: 11 mg/dL (ref 6–20)
CO2: 20 mmol/L — ABNORMAL LOW (ref 22–32)
Calcium: 9.4 mg/dL (ref 8.9–10.3)
Chloride: 109 mmol/L (ref 98–111)
Creatinine, Ser: 0.64 mg/dL (ref 0.44–1.00)
GFR calc Af Amer: >60 mL/min (ref >60)
GFR calc non Af Amer: >60 mL/min (ref >60)
Glucose, Bld: 101 mg/dL — ABNORMAL HIGH (ref 70–99)
Potassium: 3.8 mmol/L (ref 3.5–5.1)
Sodium: 140 mmol/L (ref 135–145)
Total Bilirubin: 0.4 mg/dL (ref 0.3–1.2)
Total Protein: 6.5 g/dL (ref 6.5–8.1)

## 2018-07-03 LAB — TSH: TSH: 0.253 u[IU]/mL — ABNORMAL LOW (ref 0.350–4.500)

## 2018-07-03 LAB — HEMOGLOBIN A1C
Hgb A1c MFr Bld: 5.6 % (ref 4.8–5.6)
Mean Plasma Glucose: 114.02 mg/dL

## 2018-07-03 LAB — BRAIN NATRIURETIC PEPTIDE: B Natriuretic Peptide: 1021.4 pg/mL — ABNORMAL HIGH (ref 0.0–100.0)

## 2018-07-03 LAB — TROPONIN I

## 2018-07-03 LAB — LACTIC ACID, PLASMA: Lactic Acid, Venous: 1.6 mmol/L (ref 0.5–1.9)

## 2018-07-03 MED ORDER — ENOXAPARIN SODIUM 40 MG/0.4ML ~~LOC~~ SOLN
40.0000 mg | SUBCUTANEOUS | Status: DC
  Administered 2018-07-03 – 2018-07-04 (×2): 40 mg via SUBCUTANEOUS
  Filled 2018-07-03 (×3): qty 0.4

## 2018-07-03 MED ORDER — SPIRONOLACTONE 12.5 MG HALF TABLET
12.5000 mg | ORAL_TABLET | Freq: Every day | ORAL | Status: DC
  Administered 2018-07-03 – 2018-07-04 (×2): 12.5 mg via ORAL
  Filled 2018-07-03 (×2): qty 1

## 2018-07-03 MED ORDER — ACETAMINOPHEN 325 MG PO TABS: 650.0000 mg | ORAL_TABLET | Freq: Four times a day (QID) | ORAL | Status: DC | PRN

## 2018-07-03 MED ORDER — GUAIFENESIN 100 MG/5ML PO SOLN
5.0000 mL | ORAL | Status: DC | PRN
  Administered 2018-07-03: 100 mg via ORAL
  Filled 2018-07-03: qty 5

## 2018-07-03 MED ORDER — ACETAMINOPHEN 650 MG RE SUPP: 650.0000 mg | Freq: Four times a day (QID) | RECTAL | Status: DC | PRN

## 2018-07-03 MED ORDER — SODIUM CHLORIDE 0.9 % IV SOLN
1.0000 g | Freq: Once | INTRAVENOUS | Status: AC
  Administered 2018-07-03: 1 g via INTRAVENOUS
  Filled 2018-07-03: qty 10

## 2018-07-03 MED ORDER — SODIUM CHLORIDE 0.9% FLUSH
3.0000 mL | Freq: Two times a day (BID) | INTRAVENOUS | Status: DC
  Administered 2018-07-03 – 2018-07-05 (×5): 3 mL via INTRAVENOUS

## 2018-07-03 MED ORDER — POLYETHYLENE GLYCOL 3350 17 G PO PACK
17.0000 g | PACK | Freq: Every day | ORAL | Status: DC | PRN
  Administered 2018-07-03: 17 g via ORAL
  Filled 2018-07-03: qty 1

## 2018-07-03 MED ORDER — ALBUTEROL SULFATE HFA 108 (90 BASE) MCG/ACT IN AERS
4.0000 | INHALATION_SPRAY | Freq: Once | RESPIRATORY_TRACT | Status: AC
  Administered 2018-07-03: 4 via RESPIRATORY_TRACT
  Filled 2018-07-03: qty 6.7

## 2018-07-03 MED ORDER — BENZONATATE 100 MG PO CAPS
100.0000 mg | ORAL_CAPSULE | Freq: Two times a day (BID) | ORAL | Status: DC | PRN
  Administered 2018-07-03: 100 mg via ORAL
  Filled 2018-07-03: qty 1

## 2018-07-03 MED ORDER — SODIUM CHLORIDE 0.9 % IV BOLUS
1000.0000 mL | Freq: Once | INTRAVENOUS | Status: AC
  Administered 2018-07-03: 1000 mL via INTRAVENOUS

## 2018-07-03 MED ORDER — LOSARTAN POTASSIUM 50 MG PO TABS
25.0000 mg | ORAL_TABLET | Freq: Every day | ORAL | Status: DC
  Administered 2018-07-03: 25 mg via ORAL
  Filled 2018-07-03: qty 1

## 2018-07-03 MED ORDER — POTASSIUM CHLORIDE CRYS ER 20 MEQ PO TBCR
20.0000 meq | EXTENDED_RELEASE_TABLET | Freq: Two times a day (BID) | ORAL | Status: DC
  Administered 2018-07-03 – 2018-07-04 (×2): 20 meq via ORAL
  Filled 2018-07-03 (×2): qty 2
  Filled 2018-07-03: qty 1
  Filled 2018-07-03: qty 2

## 2018-07-03 MED ORDER — SODIUM CHLORIDE 0.9 % IV SOLN
500.0000 mg | Freq: Once | INTRAVENOUS | Status: AC
  Administered 2018-07-03: 500 mg via INTRAVENOUS
  Filled 2018-07-03: qty 500

## 2018-07-03 MED ORDER — SACUBITRIL-VALSARTAN 24-26 MG PO TABS
1.0000 | ORAL_TABLET | Freq: Two times a day (BID) | ORAL | Status: DC
  Administered 2018-07-03 – 2018-07-05 (×4): 1 via ORAL
  Filled 2018-07-03 (×5): qty 1

## 2018-07-03 MED ORDER — FUROSEMIDE 10 MG/ML IJ SOLN
40.0000 mg | Freq: Once | INTRAMUSCULAR | Status: AC
  Administered 2018-07-03: 40 mg via INTRAVENOUS
  Filled 2018-07-03: qty 4

## 2018-07-03 MED ORDER — DIGOXIN 125 MCG PO TABS
0.1250 mg | ORAL_TABLET | Freq: Every day | ORAL | Status: DC
  Administered 2018-07-03 – 2018-07-04 (×2): 0.125 mg via ORAL
  Filled 2018-07-03 (×2): qty 1

## 2018-07-03 NOTE — ED Notes (Signed)
Pt's urine cannister emptied and replaced at this time.

## 2018-07-03 NOTE — Consult Note (Addendum)
Advanced Heart Failure Team Consult Note   Primary Physician: Patient, No Pcp Per PCP-Cardiologist:  No primary care provider on file.  Reason for Consultation: Acute HF  HPI:    Tara Bradshaw is a 33 y/o woman with no significant PMHx whom we are asked to see by Dr. Reesa Chew for new onset HF.   Denies any h/o known heart disease or family h/o HF or cardiomyopathy. She is almost now 7 months postpartum with her second child. First pregnancy was uncomplicated.  Approximately 2 weeks ago she developed a productive cough with "brownish" phlegm associated with shortness of breath and dyspnea on exertion.  Patient states that her cough and shortness of breath gradually progressed until last week when she went to urgent care and was given amoxicillin and Tessalon Perles for possible bronchitis. Over the past week she developed LE edema, orthopnea and PND so came to ER.  In ER CXR with cardiomegaly and bilateral infiltrates c/w PNA or HF. Chest CT with bilateral infiltrates and effusions. BNP 1,021. Trop < 0.03. Lactate 1.6. IMTS did bedside echo and found reduced EF and we were called .  Currently with severe cough of clear sputum. No fevers or chills. No recent travel.   I did bedside echo LVEF 25% with dilated ventricle. Moderate RV dysfunction. Mild MR   Review of Systems: [y] = yes, [ ]  = no    General: Weight gain [y]; Weight loss [ ] ; Anorexia [ ] ; Fatigue [ ] ; Fever [ ] ; Chills [ ] ; Weakness [ ]    Cardiac: Chest pain/pressure [ ] ; Resting SOB Blue.Reese ]; Exertional SOB Blue.Reese ]; Orthopnea Blue.Reese ]; Pedal Edema Blue.Reese ]; Palpitations [ ] ; Syncope [ ] ; Presyncope [ ] ; Paroxysmal nocturnal dyspnea[y]   Pulmonary: Cough Blue.Reese ]; Wheezing[ ] ; Hemoptysis[ ] ; Sputum [ ] ; Snoring [y]   GI: Vomiting[ ] ; Dysphagia[ ] ; Melena[ ] ; Hematochezia [ ] ; Heartburn[ ] ; Abdominal pain [ ] ; Constipation [ ] ; Diarrhea [ ] ; BRBPR [ ]    GU: Hematuria[ ] ; Dysuria [ ] ; Nocturia[ ]    Vascular: Pain in legs with walking [ ] ; Pain  in feet with lying flat [ ] ; Non-healing sores [ ] ; Stroke [ ] ; TIA [ ] ; Slurred speech [ ] ;   Neuro: Headaches[ ] ; Vertigo[ ] ; Seizures[ ] ; Paresthesias[ ] ;Blurred vision [ ] ; Diplopia [ ] ; Vision changes [ ]    Ortho/Skin: Arthritis [ ] ; Joint pain [ ] ; Muscle pain [ ] ; Joint swelling [ ] ; Back Pain [ ] ; Rash [ ]    Psych: Depression[ ] ; Anxiety[ ]    Heme: Bleeding problems [ ] ; Clotting disorders [ ] ; Anemia [ ]    Endocrine: Diabetes [ ] ; Thyroid dysfunction[ ]   Home Medications Prior to Admission medications   Medication Sig Start Date End Date Taking? Authorizing Provider  amoxicillin (AMOXIL) 875 MG tablet Take 1 tablet (875 mg total) by mouth 2 (two) times daily for 10 days. 06/27/18 07/07/18 Yes Raylene Everts, MD  benzonatate (TESSALON) 200 MG capsule Take 1 capsule (200 mg total) by mouth 2 (two) times daily as needed for cough. 06/27/18  Yes Raylene Everts, MD    Past Medical History: Past Medical History:  Diagnosis Date   Anemia    CHRONIC   GERD (gastroesophageal reflux disease)    WITH PREGNANCY   Pneumonia    x 1    Past Surgical History: Past Surgical History:  Procedure Laterality Date   DILATION AND EVACUATION N/A 11/30/2016   Procedure: DILATATION AND EVACUATION;  Surgeon: Donnamae Jude, MD;  Location: [facility];  Service: Gynecology;  Laterality: N/A;   WISDOM TOOTH EXTRACTION     NO ANESTHESIA    Family History: Family History  Problem Relation Age of Onset   Epilepsy Mother    Asthma Mother    Cancer Paternal Grandmother        BREAST   Cancer Maternal Aunt 89       BREAST   Hypertension Maternal Grandmother     Social History: Social History   Socioeconomic History   Marital status: Married    Spouse name: Not on file   Number of children: Not on file   Years of education:  14   Highest education level: Not on file  Occupational History   Occupation: SECURITY OFFICER    Employer: [employer]  Designer, fashion/clothing strain: Not hard at all   Food insecurity:    Worry: Never true    Inability: Never true   Transportation needs:    Medical: No    Non-medical: No  Tobacco Use   Smoking status: Never Smoker   Smokeless tobacco: Never Used  Substance and Sexual Activity   Alcohol use: No   Drug use: No   Sexual activity: Yes    Partners: Male    Birth control/protection: None  Lifestyle   Physical activity:    Days per week: 0 days    Minutes per session: 0 min   Stress: Not on file  Relationships   Social connections:    Talks on phone: Not on file    Gets together: Not on file    Attends religious service: Not on file    Active member of club or organization: Not on file    Attends meetings of clubs or organizations: Not on file    Relationship status: Not on file  Other Topics Concern   Not on file  Social History Narrative   Not on file    Allergies:  No Known Allergies  Objective:    Vital Signs:   Temp:  [98.6 F (37 C)-99.3 F (37.4 C)] 98.6 F (37 C) (03/26 1714) Pulse Rate:  [65-116] 101 (03/26 1540) Resp:  [13-33] 16 (03/26 1714) BP: (118-168)/(75-110) 136/82 (03/26 1714) SpO2:  [92 %-99 %] 96 % (03/26 1714) Weight:  [112.8 kg] 112.8 kg (03/26 1833)    Weight change: Filed Weights   07/03/18 1833  Weight: 112.8 kg    Intake/Output:   Intake/Output Summary (Last 24 hours) at 07/03/2018 2001 Last data filed at 07/03/2018 1505 Gross per 24 hour  Intake 1000 ml  Output 2350 ml  Net -1350 ml      Physical Exam    General:  Obese woman sitting in bed with hacking cough HEENT: normal Neck: supple. JVP  To jaw . Carotids 2+ bilat; no bruits. No lymphadenopathy or thyromegaly appreciated. Cor: PMI nondisplaced. Tachy regular.  No obvious s3 Lungs: basilar crackles  Abdomen: obese soft, nontender, nondistended. No hepatosplenomegaly. No bruits or masses. Good bowel sounds. Extremities: no cyanosis, clubbing, rash, 2+  edema (warm) Neuro: alert & orientedx3, cranial nerves grossly intact. moves all 4 extremities w/o difficulty. Affect pleasant   Telemetry   Sinus tach 110-120 with PVCs. Personally reviewed  EKG    Sinus tach 117 with LAFB. + PVCs  Personally reviewed   Labs   Basic Metabolic Panel: Recent Labs  Lab 07/03/18 0845  NA 140  K 3.8  CL  109  CO2 20*  GLUCOSE 101*  BUN 11  CREATININE 0.64  CALCIUM 9.4    Liver Function Tests: Recent Labs  Lab 07/03/18 0845  AST 48*  ALT 62*  ALKPHOS 70  BILITOT 0.4  PROT 6.5  ALBUMIN 3.7   No results for input(s): LIPASE, AMYLASE in the last 168 hours. No results for input(s): AMMONIA in the last 168 hours.  CBC: Recent Labs  Lab 07/03/18 0845  WBC 10.1  NEUTROABS 6.3  HGB 10.9*  HCT 36.0  MCV 80.7  PLT 447*    Cardiac Enzymes: Recent Labs  Lab 07/03/18 0845  TROPONINI <0.03    BNP: BNP (last 3 results) Recent Labs    07/03/18 0845  BNP 1,021.4*    ProBNP (last 3 results) No results for input(s): PROBNP in the last 8760 hours.   CBG: No results for input(s): GLUCAP in the last 168 hours.  Coagulation Studies: No results for input(s): LABPROT, INR in the last 72 hours.   Imaging   Ct Chest Wo Contrast  Result Date: 07/03/2018 CLINICAL DATA:  Worsening cough over 2 weeks EXAM: CT CHEST WITHOUT CONTRAST TECHNIQUE: Multidetector CT imaging of the chest was performed following the standard protocol without IV contrast. Sagittal and coronal MPR images reconstructed from axial data set. COMPARISON:  Chest radiograph 07/03/2018 FINDINGS: Cardiovascular: Aorta normal caliber. Cardiac chambers appear enlarged. No significant pericardial effusion. Mediastinum/Nodes: Minimal residual thymic tissue in anterior mediastinum. Base of cervical region normal appearance. Esophagus unremarkable. Lungs/Pleura: Small BILATERAL pleural effusions slightly larger on RIGHT. Scattered central peribronchovascular infiltrates in  RIGHT upper and RIGHT lower lobes, little in RIGHT middle lobe. Additional minimal infiltrate in LEFT upper and LEFT lower lobes. Calcified granuloma anterior RIGHT upper lobe image 66. Minimal peribronchial thickening. No pneumothorax. Upper Abdomen: Unremarkable Musculoskeletal: Unremarkable. IMPRESSION: Enlargement of cardiac chambers. Patchy BILATERAL central/peribronchovascular infiltrates asymmetrically greater in RIGHT lung, nonspecific, could represent edema or infection. Associated small BILATERAL pleural effusions slightly larger on RIGHT. Electronically Signed   By: Lavonia Dana M.D.   On: 07/03/2018 16:17   Dg Chest Portable 1 View  Result Date: 07/03/2018 CLINICAL DATA:  Cough, dyspnea. EXAM: PORTABLE CHEST 1 VIEW COMPARISON:  Radiographs of May 08, 2007. FINDINGS: Enlarged cardiac silhouette is noted suggesting underlying cardiomegaly or possibly pericardial effusion. No pneumothorax or pleural effusion is noted. Mild reticular opacities are noted throughout both lungs, with right greater than left. Is uncertain if this represents edema or possibly atypical inflammation. Focal airspace opacity is noted in right upper lobe which may represent possible pneumonia or atelectasis. Bony thorax is unremarkable. IMPRESSION: Interval enlargement of cardiac silhouette is noted suggesting underlying cardiomegaly or possibly pericardial effusion. Increased interstitial densities are noted throughout both lungs, right greater than left, which may represent edema or possibly atypical inflammation. Focal ill-defined airspace opacity is noted in right upper lobe which may represent pneumonia or atelectasis. Electronically Signed   By: Marijo Conception, M.D.   On: 07/03/2018 09:10      Medications:     Current Medications:  enoxaparin (LOVENOX) injection  40 mg Subcutaneous Q24H   [START ON 07/04/2018] furosemide  40 mg Intravenous Once   sacubitril-valsartan  1 tablet Oral BID   sodium chloride  flush  3 mL Intravenous Q12H     Infusions:     Patient Profile   33 y/o woman with no PMHx now 7 months post-partum presents with acute systolic HF EF 72%.  Assessment/Plan   1. Acute systolic HF -  I performed a bedside echo LVEF 25% with moderate RV dysfunction - Almost certainly NICM. Either viral (har URI last week) or post-partum (PVC cardiomyopathy less likely but will quantify PVCs on tele) - Now NYHA IV with volume overload and prominent tachycardia - Agree with IV diuresis with lasix 40 IV bid - Start entresto 25/26 bid as BP tolerates - Add digoxin and spiro - No b-blocker yet with possible marginal cardiac output - Will eventually need cMRI - Discussed need to avoid further pregnancies and cease breastfeeding. (she is not planning on having more children and currently has IUD in place)  2. Acute hypoxic respiratory failure - likely due to HF but being ruled out for COVID in setting of recent URI sx. Respiratory panel + for rhinovirus - CT chest looks most c/w HF - should improve with diuresis   3. Snoring - sounds like she has OSA. Will need outpatient PSG   Length of Stay: 0  Glori Bickers, MD  07/03/2018, 8:01 PM  Advanced Heart Failure Team Pager [phone] (M-F; 7a - 4p)  Please contact [facility] for night-coverage after hours (4p -7a ) and weekends on [email or web address]

## 2018-07-03 NOTE — ED Triage Notes (Signed)
Pt with cough worsening over 2 weeks. Went to urgent care and got tesslon pearls but did not help. Cannot lie down flat. Has to sit up. Sats drop to 91 with exertion but 98 on room air

## 2018-07-03 NOTE — H&P (Signed)
Date: 07/03/2018               Patient Name:  Tara Bradshaw MRN: [id number]  DOB: 09-Jun-1985 Age / Sex: 33 y.o., female   PCP: Patient, No Pcp Per         Medical Service: Internal Medicine Teaching Service         Attending Physician: Dr. Aldine Contes, MD    First Contact: Dr. Karen Kitchens Pager: [phone]  Second Contact: Dr. Berline Lopes Pager: [phone]       After Hours (After 5p/  First Contact Pager: [phone]  weekends / holidays): Second Contact Pager: [phone]   Chief Complaint: Worsening cough and shortness of breath for the past 2 weeks.  History of Present Illness: Tara Bradshaw is a 33 y.o. female with no significant past medical history presented to ED with complaint of worsening cough and shortness of breath for 2 weeks.  Per patient she was experiencing cough and shortness of breath for little over 2 weeks.  Her shortness of breath has worsened to the point that she cannot walk few steps.  She is experiencing severe orthopnea and unable to lay down for the past few nights.  She went to urgent care last week and was given amoxicillin and Tessalon Perles for a possible bronchitis, she completed the course with no change.  She denies any fever or chills.  She denies any nasal congestion or sore throat.  She denies any chest pain.  She denies any nausea, vomiting or diarrhea.  She denies any illness over the past few month.  She is a stay-at-home mom.  Had her second baby 29-month ago on 12/12/17. She denies any sick contacts.  Denies any recent travel.  Does not know anyone with positive COVID-19.  ED course.  She was afebrile, mildly tachycardic, labs with normal CBC and differential except for mild thrombocytosis, CMP with mild transaminitis, BNP 1,021.4.  Chest x-ray with bilateral infiltrate/pulmonary edema.  Meds:  No outpatient medications have been marked as taking for the 07/03/18 encounter [facility] Encounter).   Allergies: Allergies as of 07/03/2018  . (No  Known Allergies)   Past Medical History:  Diagnosis Date  . Anemia    CHRONIC  . GERD (gastroesophageal reflux disease)    WITH PREGNANCY  . Pneumonia    x 1    Family History: Mother and an aunt with hypertension.  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  Non-smoker, denies any alcohol or illicit drug use.  Review of Systems: A complete ROS was negative except as per HPI.   Physical Exam: Blood pressure (!) 144/100, pulse (!) 114, temperature 99.3 F (37.4 C), temperature source Oral, resp. rate 20, last menstrual period 06/27/2018, SpO2 99 %, unknown if currently breastfeeding. Vitals:   07/03/18 1300 07/03/18 1400 07/03/18 1415 07/03/18 1540  BP: (!) 133/93   (!) 127/92  Pulse: (!) 113 (!) 110 (!) 104 (!) 101  Resp: (!) 27 13 (!) 25 13  Temp:      TempSrc:      SpO2: 97% 99% 98% 96%   General: Vital signs reviewed.  Patient is well-developed and well-nourished, in no acute distress and cooperative with exam.  Head: Normocephalic and atraumatic. Eyes: EOMI, conjunctivae normal, no scleral icterus.  Neck: Supple, trachea midline, normal ROM, no JVD, Cardiovascular: Sinus tachycardia, no murmurs, gallops, or rubs. Pulmonary/Chest: Bilateral basal crackles. Abdominal: Soft, non-tender, non-distended, BS +,  Extremities: No lower extremity edema bilaterally,  pulses  symmetric and intact bilaterally. No cyanosis or clubbing. Neurological: A&O x3, Strength is normal and symmetric bilaterally, cranial nerve II-XII are grossly intact, no focal motor deficit, sensory intact to light touch bilaterally.  Skin: Warm, dry and intact. No rashes or erythema. Psychiatric: Normal mood and affect. speech and behavior is normal. Cognition and memory are normal.  EKG: personally reviewed my interpretation is this rhythm with multiple PVCs and some nonspecific T wave changes.  CXR: personally reviewed my interpretation is bilateral pulmonary infiltrate/most likely pulmonary  edema.  Cardiac silhouette appears large but it was AP view.  Assessment & Plan by Problem: Tara Bradshaw is a 33 y.o. female with no significant past medical history presented to ED with complaint of worsening cough and shortness of breath for 2 weeks.  Active Problems:   Acute heart failure (HCC) Her symptoms, exam and POCUS findings was concerning for acute heart failure.  No prior cardiac history.  Patient is postpartum 58-month.  No obvious recent viral illness but respiratory viral panel was positive for rhinovirus.  COVID-19 testing pending, not much risk factor but under current circumstances can also present as heart failure symptom. Talked with Dr. Haroldine Laws from heart failure team, he agreed with testing but postpartum cardiomyopathy can also present this late.  He advised to start her on Entresto 24-26 and he will see the patient- we appreciate his recommendation. -Admit to cardiac telemetry. -Echocardiogram. -Daily weight. -Daily BMP. -Lasix 40 mg daily.-She received 2 doses of 40 mg today. Tara Bradshaw 24-26 twice daily. -Doing CT chest.  DVT prophylaxis.  Lovenox CODE STATUS.  Full Diet.  Heart healthy  Dispo: Admit patient to Inpatient with expected length of stay greater than 2 midnights.  SignedLorella Nimrod, MD 07/03/2018, 11:47 AM  Pager: [phone]

## 2018-07-03 NOTE — Progress Notes (Signed)
Attending cardiology review of echo order:  Due to the spread of COVID-19, departmental policy is to review orders for procedures and determine appropriateness regarding testing at this time. The following criteria are being used to limit potential exposure and spread of infection.  Is the patient being evaluated for COVID-19 infection: TBD per primary team  Is the patient actively having infectious respiratory symptoms or fever: Yes, blood cultures and respiratory panel ordered. Does the patient have a known history of prior cardiovascular disease: No Would the test change management of the patient: potentially, though not acutely Can the test be performed at a later time: yes (short term)  Special circumstances: Is the patient undergoing evaluation for embolic CVA: No Is the patient planned for chemotherapy: No  Based on the review above, this study is not to be performed at this time.  Based on notes by ordering provider, she is hemodynamically stable. Per notes from ordering provider, Dr. Regenia Skeeter, "She is not in shock or hypotensive and so I do not think an emergent bedside ultrasound is needed for what could be a mild pericardial effusion."  We will await results of the respiratory swab to at least rule out flu. We can then re-evaluate the echo. If her clinical status changes, please call the cardiologist on call or the cardiology cardmaster (depending on time of day) and we will re-evaluate.  Buford Dresser, MD, PhD [facility]  [address], [place], [place] [address] [phone]

## 2018-07-03 NOTE — ED Provider Notes (Addendum)
[facility] EMERGENCY DEPARTMENT Provider Note   CSN: [id number] Arrival date & time: 07/03/18  0757    History   Chief Complaint Chief Complaint  Patient presents with  . Cough    HPI Tara Bradshaw is a 33 y.o. female.     HPI  33 year old female presents with dyspnea.  She states she is been having a cough for a couple weeks.  She went to urgent care on 3/20.  She was given benzonatate and amoxicillin but states these have not been helping and she seems to be getting worse.  No fever during this time.  No known contacts with someone with COVID.  No chest pain, sore throat, leg swelling.  At this time she feels like she cannot breathe.  Has not heard any wheezing. It is worse to lay flat.  Past Medical History:  Diagnosis Date  . Anemia    CHRONIC  . GERD (gastroesophageal reflux disease)    WITH PREGNANCY  . Pneumonia    x 1    Patient Active Problem List   Diagnosis Date Noted  . Anemia, postpartum 12/13/2017  . Normal postpartum course 12/13/2017  . Indication for care in labor or delivery 12/11/2017  . Hyperemesis affecting pregnancy, antepartum 07/25/2017  . Dehydration during pregnancy 07/23/2017  . Obesity, Class III, BMI 40-49.9 (morbid obesity) ([place]) 11/26/2016  . GERD (gastroesophageal reflux disease) 11/26/2016    Past Surgical History:  Procedure Laterality Date  . DILATION AND EVACUATION N/A 11/30/2016   Procedure: DILATATION AND EVACUATION;  Surgeon: Donnamae Jude, MD;  Location: [facility];  Service: Gynecology;  Laterality: N/A;  . WISDOM TOOTH EXTRACTION     NO ANESTHESIA     OB History    Gravida  3   Para  2   Term  2   Preterm      AB  1   Living  2     SAB  1   TAB      Ectopic      Multiple  0   Live Births  2        Obstetric Comments  G2- D&C for missed AB         Home Medications    Prior to Admission medications   Medication Sig Start Date End Date Taking? Authorizing Provider   amoxicillin (AMOXIL) 875 MG tablet Take 1 tablet (875 mg total) by mouth 2 (two) times daily for 10 days. 06/27/18 07/07/18  Raylene Everts, MD  benzonatate (TESSALON) 200 MG capsule Take 1 capsule (200 mg total) by mouth 2 (two) times daily as needed for cough. 06/27/18   Raylene Everts, MD    Family History Family History  Problem Relation Age of Onset  . Epilepsy Mother   . Asthma Mother   . Cancer Paternal Grandmother        BREAST  . Cancer Maternal Aunt 52       BREAST  . Hypertension Maternal Grandmother     Social History Social History   Tobacco Use  . Smoking status: Never Smoker  . Smokeless tobacco: Never Used  Substance Use Topics  . Alcohol use: No  . Drug use: No     Allergies   Patient has no known allergies.   Review of Systems Review of Systems  Constitutional: Negative for fever.  HENT: Negative for sore throat.   Respiratory: Positive for cough and shortness of breath.   Cardiovascular: Negative for chest  pain and leg swelling.  All other systems reviewed and are negative.    Physical Exam Updated Vital Signs BP (!) 144/100   Pulse (!) 114   Temp 99.3 F (37.4 C) (Oral)   Resp 20   LMP 06/27/2018   SpO2 99%   Physical Exam Vitals signs and nursing note reviewed.  Constitutional:      Appearance: She is well-developed. She is obese. She is not ill-appearing or diaphoretic.  HENT:     Head: Normocephalic and atraumatic.     Right Ear: External ear normal.     Left Ear: External ear normal.     Nose: Nose normal.  Eyes:     General:        Right eye: No discharge.        Left eye: No discharge.  Cardiovascular:     Rate and Rhythm: Regular rhythm. Tachycardia present.     Heart sounds: Normal heart sounds.  Pulmonary:     Effort: Pulmonary effort is normal. Tachypnea present.     Breath sounds: Examination of the right-lower field reveals decreased breath sounds. Decreased breath sounds present. No wheezing.  Abdominal:      Palpations: Abdomen is soft.     Tenderness: There is no abdominal tenderness.  Musculoskeletal:     Right lower leg: No edema.     Left lower leg: No edema.  Skin:    General: Skin is warm and dry.  Neurological:     Mental Status: She is alert.  Psychiatric:        Mood and Affect: Mood is not anxious.      ED Treatments / Results  Labs (all labs ordered are listed, but only abnormal results are displayed) Labs Reviewed  COMPREHENSIVE METABOLIC PANEL - Abnormal; Notable for the following components:      Result Value   CO2 20 (*)    Glucose, Bld 101 (*)    AST 48 (*)    ALT 62 (*)    All other components within normal limits  CBC WITH DIFFERENTIAL/PLATELET - Abnormal; Notable for the following components:   Hemoglobin 10.9 (*)    MCH 24.4 (*)    Platelets 447 (*)    All other components within normal limits  BRAIN NATRIURETIC PEPTIDE - Abnormal; Notable for the following components:   B Natriuretic Peptide 1,021.4 (*)    All other components within normal limits  CULTURE, BLOOD (ROUTINE X 2)  CULTURE, BLOOD (ROUTINE X 2)  RESPIRATORY PANEL BY PCR  LACTIC ACID, PLASMA  TROPONIN I  I-STAT BETA HCG BLOOD, ED ([facility], [facility], [facility] ONLY)  CBG MONITORING, ED    EKG EKG Interpretation  Date/Time:  Thursday July 03 2018 08:09:00 EDT Ventricular Rate:  117 PR Interval:    QRS Duration: 104 QT Interval:  335 QTC Calculation: 468 R Axis:   -73 Text Interpretation:  Sinus tachycardia Multiform ventricular premature complexes Left anterior fascicular block Borderline T abnormalities, lateral leads rate is faster compared to May 2019 Confirmed by Sherwood Gambler [id number]) on 07/03/2018 8:16:29 AM   Radiology Dg Chest Portable 1 View  Result Date: 07/03/2018 CLINICAL DATA:  Cough, dyspnea. EXAM: PORTABLE CHEST 1 VIEW COMPARISON:  Radiographs of May 08, 2007. FINDINGS: Enlarged cardiac silhouette is noted suggesting underlying cardiomegaly or possibly pericardial effusion. No  pneumothorax or pleural effusion is noted. Mild reticular opacities are noted throughout both lungs, with right greater than left. Is uncertain if this represents edema or possibly atypical inflammation.  Focal airspace opacity is noted in right upper lobe which may represent possible pneumonia or atelectasis. Bony thorax is unremarkable. IMPRESSION: Interval enlargement of cardiac silhouette is noted suggesting underlying cardiomegaly or possibly pericardial effusion. Increased interstitial densities are noted throughout both lungs, right greater than left, which may represent edema or possibly atypical inflammation. Focal ill-defined airspace opacity is noted in right upper lobe which may represent pneumonia or atelectasis. Electronically Signed   By: Marijo Conception, M.D.   On: 07/03/2018 09:10    Procedures Procedures (including critical care time)  Medications Ordered in ED Medications  albuterol (PROVENTIL HFA;VENTOLIN HFA) 108 (90 Base) MCG/ACT inhaler 4 puff (4 puffs Inhalation Given 07/03/18 0850)  sodium chloride 0.9 % bolus 1,000 mL (0 mLs Intravenous Stopped 07/03/18 1004)  cefTRIAXone (ROCEPHIN) 1 g in sodium chloride 0.9 % 100 mL IVPB (0 g Intravenous Stopped 07/03/18 1041)  azithromycin (ZITHROMAX) 500 mg in sodium chloride 0.9 % 250 mL IVPB (0 mg Intravenous Stopped 07/03/18 1015)  furosemide (LASIX) injection 40 mg (40 mg Intravenous Given 07/03/18 1006)     Initial Impression / Assessment and Plan / ED Course  I have reviewed the triage vital signs and the nursing notes.  Pertinent labs & imaging results that were available during my care of the patient were reviewed by me and considered in my medical decision making (see chart for details).        Patient's tachypnea and work of breathing have improved with albuterol inhaler.  Her chest x-ray shows interstitial edema with some may be atypical pneumonia.  Blood cultures were obtained and she was given community-acquired pneumonia  antibiotics.  However now, her BNP is showing over 1000.  Unclear why she is having heart failure but this seems to be the likely cause of her chest x-ray findings and dyspnea.  She was originally given some fluids given the course of what originally sounded infectious and this was then stopped and she was given IV Lasix.  She is currently being kept on droplet and contact precautions and COVID testing will be deferred to internal medicine teaching service.  They ask for RVP for now.  She is not in shock or hypotensive and so I do not think an emergent bedside ultrasound is needed for what could be a mild pericardial effusion, more likely this is cardiomegaly and she will need a formal echo either way.  Final Clinical Impressions(s) / ED Diagnoses   Final diagnoses:  Acute congestive heart failure, unspecified heart failure type [facility])    ED Discharge Orders    None       Sherwood Gambler, MD 07/03/18 1135    Sherwood Gambler, MD 07/03/18 1135

## 2018-07-03 NOTE — ED Notes (Addendum)
ED TO INPATIENT HANDOFF REPORT  ED Nurse Name and Phone #:  Gwynn Burly Name/Age/Gender Tara Bradshaw 33 y.o. female Room/Bed: 023C/023C  Code Status   Code Status: Full Code  Home/SNF/Other Home Patient oriented to: self, place, time and situation Is this baseline? Yes   Triage Complete: Triage complete  Chief Complaint cant breathe  Triage Note Pt with cough worsening over 2 weeks. Went to urgent care and got tesslon pearls but did not help. Cannot lie down flat. Has to sit up. Sats drop to 91 with exertion but 98 on room air   Allergies No Known Allergies  Level of Care/Admitting Diagnosis ED Disposition    ED Disposition Condition [facility] Area: [facility] [[id number]]  Level of Care: Telemetry Cardiac [103]  Diagnosis: Acute heart failure [facility]) [[id number]]  Admitting Physician: Aldine Contes [phone]  Attending Physician: Aldine Contes [phone]  Estimated length of stay: past midnight tomorrow  Certification:: I certify this patient will need inpatient services for at least 2 midnights  Bed request comments: COVID19 rule out - high risk  PT Class (Do Not Modify): Inpatient [101]  PT Acc Code (Do Not Modify): Private [1]       B Medical/Surgery History Past Medical History:  Diagnosis Date  . Anemia    CHRONIC  . GERD (gastroesophageal reflux disease)    WITH PREGNANCY  . Pneumonia    x 1   Past Surgical History:  Procedure Laterality Date  . DILATION AND EVACUATION N/A 11/30/2016   Procedure: DILATATION AND EVACUATION;  Surgeon: Donnamae Jude, MD;  Location: [facility];  Service: Gynecology;  Laterality: N/A;  . WISDOM TOOTH EXTRACTION     NO ANESTHESIA     A IV Location/Drains/Wounds Patient Lines/Drains/Airways Status   Active Line/Drains/Airways    Name:   Placement date:   Placement time:   Site:   Days:   Peripheral IV 07/03/18 Left Hand   07/03/18    0849    Hand   less than 1   Peripheral IV 07/03/18  Right Antecubital   07/03/18    0839    Antecubital   less than 1          Intake/Output Last 24 hours  Intake/Output Summary (Last 24 hours) at 07/03/2018 1542 Last data filed at 07/03/2018 1235 Gross per 24 hour  Intake 1000 ml  Output 1400 ml  Net -400 ml    Labs/Imaging Results for orders placed or performed during the hospital encounter of 07/03/18 (from the past 48 hour(s))  Comprehensive metabolic panel     Status: Abnormal   Collection Time: 07/03/18  8:45 AM  Result Value Ref Range   Sodium 140 135 - 145 mmol/L   Potassium 3.8 3.5 - 5.1 mmol/L   Chloride 109 98 - 111 mmol/L   CO2 20 (L) 22 - 32 mmol/L   Glucose, Bld 101 (H) 70 - 99 mg/dL   BUN 11 6 - 20 mg/dL   Creatinine, Ser 0.64 0.44 - 1.00 mg/dL   Calcium 9.4 8.9 - 10.3 mg/dL   Total Protein 6.5 6.5 - 8.1 g/dL   Albumin 3.7 3.5 - 5.0 g/dL   AST 48 (H) 15 - 41 U/L   ALT 62 (H) 0 - 44 U/L   Alkaline Phosphatase 70 38 - 126 U/L   Total Bilirubin 0.4 0.3 - 1.2 mg/dL   GFR calc non Af Amer >60 >60 mL/min   GFR  calc Af Amer >60 >60 mL/min   Anion gap 11 5 - 15    Comment: Performed at [facility] [address], [place], [place] [address]  CBC with Differential     Status: Abnormal   Collection Time: 07/03/18  8:45 AM  Result Value Ref Range   WBC 10.1 4.0 - 10.5 K/uL   RBC 4.46 3.87 - 5.11 MIL/uL   Hemoglobin 10.9 (L) 12.0 - 15.0 g/dL   HCT 36.0 36.0 - 46.0 %   MCV 80.7 80.0 - 100.0 fL   MCH 24.4 (L) 26.0 - 34.0 pg   MCHC 30.3 30.0 - 36.0 g/dL   RDW 15.2 11.5 - 15.5 %   Platelets 447 (H) 150 - 400 K/uL   nRBC 0.0 0.0 - 0.2 %   Neutrophils Relative % 62 %   Neutro Abs 6.3 1.7 - 7.7 K/uL   Lymphocytes Relative 31 %   Lymphs Abs 3.1 0.7 - 4.0 K/uL   Monocytes Relative 5 %   Monocytes Absolute 0.5 0.1 - 1.0 K/uL   Eosinophils Relative 2 %   Eosinophils Absolute 0.2 0.0 - 0.5 K/uL   Basophils Relative 0 %   Basophils Absolute 0.0 0.0 - 0.1 K/uL   Immature Granulocytes 0 %   Abs Immature  Granulocytes 0.02 0.00 - 0.07 K/uL    Comment: Performed at [facility] [address] [place], [place] [address]  Lactic acid, plasma     Status: None   Collection Time: 07/03/18  8:45 AM  Result Value Ref Range   Lactic Acid, Venous 1.6 0.5 - 1.9 mmol/L    Comment: Performed at [facility] [address], [place], [place] [address]  Troponin I - Once     Status: None   Collection Time: 07/03/18  8:45 AM  Result Value Ref Range   Troponin I <0.03 <0.03 ng/mL    Comment: Performed at [facility] [address], [place], [place] [address]  Brain natriuretic peptide     Status: Abnormal   Collection Time: 07/03/18  8:45 AM  Result Value Ref Range   B Natriuretic Peptide 1,021.4 (H) 0.0 - 100.0 pg/mL    Comment: Performed at [facility] [address], [place], [place] [address]  I-Stat beta hCG blood, ED     Status: None   Collection Time: 07/03/18  9:01 AM  Result Value Ref Range   I-stat hCG, quantitative <5.0 <5 mIU/mL   Comment 3            Comment:   GEST. AGE      CONC.  (mIU/mL)   <=1 WEEK        5 - 50     2 WEEKS       50 - 500     3 WEEKS       100 - 10,000     4 WEEKS     1,000 - 30,000        FEMALE AND NON-PREGNANT FEMALE:     LESS THAN 5 mIU/mL   Respiratory Panel by PCR     Status: Abnormal   Collection Time: 07/03/18 11:31 AM  Result Value Ref Range   Adenovirus NOT DETECTED NOT DETECTED   Coronavirus 229E NOT DETECTED NOT DETECTED    Comment: (NOTE) The Coronavirus on the Respiratory Panel, DOES NOT test for the novel  Coronavirus (2019 nCoV)    Coronavirus HKU1 NOT DETECTED  NOT DETECTED   Coronavirus NL63 NOT DETECTED NOT DETECTED   Coronavirus OC43 NOT DETECTED NOT DETECTED   Metapneumovirus NOT DETECTED NOT DETECTED   Rhinovirus / Enterovirus DETECTED (A) NOT DETECTED   Influenza A NOT DETECTED NOT DETECTED   Influenza B NOT DETECTED NOT DETECTED   Parainfluenza Virus 1 NOT DETECTED NOT DETECTED   Parainfluenza Virus 2  NOT DETECTED NOT DETECTED   Parainfluenza Virus 3 NOT DETECTED NOT DETECTED   Parainfluenza Virus 4 NOT DETECTED NOT DETECTED   Respiratory Syncytial Virus NOT DETECTED NOT DETECTED   Bordetella pertussis NOT DETECTED NOT DETECTED   Chlamydophila pneumoniae NOT DETECTED NOT DETECTED   Mycoplasma pneumoniae NOT DETECTED NOT DETECTED    Comment: Performed at [facility], [place] [address], [place], [place] [address]  TSH     Status: Abnormal   Collection Time: 07/03/18  2:24 PM  Result Value Ref Range   TSH 0.253 (L) 0.350 - 4.500 uIU/mL    Comment: Performed by a 3rd Generation assay with a functional sensitivity of <=0.01 uIU/mL. Performed at [facility], [place] [address] [place], [place] [address]   Hemoglobin A1c     Status: None   Collection Time: 07/03/18  2:24 PM  Result Value Ref Range   Hgb A1c MFr Bld 5.6 4.8 - 5.6 %    Comment: (NOTE) Pre diabetes:          5.7%-6.4% Diabetes:              >6.4% Glycemic control for   <7.0% adults with diabetes    Mean Plasma Glucose 114.02 mg/dL    Comment: Performed at [facility] [address] [place], [place] [address]   Dg Chest Portable 1 View  Result Date: 07/03/2018 CLINICAL DATA:  Cough, dyspnea. EXAM: PORTABLE CHEST 1 VIEW COMPARISON:  Radiographs of May 08, 2007. FINDINGS: Enlarged cardiac silhouette is noted suggesting underlying cardiomegaly or possibly pericardial effusion. No pneumothorax or pleural effusion is noted. Mild reticular opacities are noted throughout both lungs, with right greater than left. Is uncertain if this represents edema or possibly atypical inflammation. Focal airspace opacity is noted in right upper lobe which may represent possible pneumonia or atelectasis. Bony thorax is unremarkable. IMPRESSION: Interval enlargement of cardiac silhouette is noted suggesting underlying cardiomegaly or possibly pericardial effusion. Increased interstitial densities are noted throughout both lungs,  right greater than left, which may represent edema or possibly atypical inflammation. Focal ill-defined airspace opacity is noted in right upper lobe which may represent pneumonia or atelectasis. Electronically Signed   By: Marijo Conception, M.D.   On: 07/03/2018 09:10    Pending Labs Unresulted Labs (From admission, onward)    Start     Ordered   07/10/18 0500  Creatinine, serum  (enoxaparin (LOVENOX)    CrCl >/= 30 ml/min)  Weekly,   R    Comments:  while on enoxaparin therapy    07/03/18 1143   07/04/18 8101  Basic metabolic panel  Daily,   R     07/03/18 1143   07/04/18 0500  CBC  Tomorrow morning,   R     07/03/18 1143   07/03/18 1408  Novel Coronavirus, NAA (hospital order; send-out to ref lab)  (Novel Coronavirus, NAA [facility] Order; send-out to ref lab) with precautions panel)  Once,   R    Comments:  Cough, shortness of breath, pulm infiltrates   Question:  Current symptoms  Answer:  Other (  testing not indicated)   07/03/18 1409   07/03/18 1136  HIV antibody (Routine Testing)  Once,   R     07/03/18 1143   07/03/18 0915  Culture, blood (routine x 2)  BLOOD CULTURE X 2,   STAT     07/03/18 0917          Vitals/Pain Today's Vitals   07/03/18 1300 07/03/18 1400 07/03/18 1415 07/03/18 1540  BP: (!) 133/93   (!) 127/92  Pulse: (!) 113 (!) 110 (!) 104 (!) 101  Resp: (!) 27 13 (!) 25 13  Temp:      TempSrc:      SpO2: 97% 99% 98% 96%  PainSc:    0-No pain    Isolation Precautions Droplet and Contact precautions  Medications Medications  enoxaparin (LOVENOX) injection 40 mg (has no administration in time range)  sodium chloride flush (NS) 0.9 % injection 3 mL (3 mLs Intravenous Given 07/03/18 1236)  acetaminophen (TYLENOL) tablet 650 mg (has no administration in time range)    Or  acetaminophen (TYLENOL) suppository 650 mg (has no administration in time range)  polyethylene glycol (MIRALAX / GLYCOLAX) packet 17 g (has no administration in time range)  losartan  (COZAAR) tablet 25 mg (25 mg Oral Given 07/03/18 1235)  furosemide (LASIX) injection 40 mg (has no administration in time range)  furosemide (LASIX) injection 40 mg (has no administration in time range)  albuterol (PROVENTIL HFA;VENTOLIN HFA) 108 (90 Base) MCG/ACT inhaler 4 puff (4 puffs Inhalation Given 07/03/18 0850)  sodium chloride 0.9 % bolus 1,000 mL (0 mLs Intravenous Stopped 07/03/18 1004)  cefTRIAXone (ROCEPHIN) 1 g in sodium chloride 0.9 % 100 mL IVPB (0 g Intravenous Stopped 07/03/18 1041)  azithromycin (ZITHROMAX) 500 mg in sodium chloride 0.9 % 250 mL IVPB (0 mg Intravenous Stopped 07/03/18 1015)  furosemide (LASIX) injection 40 mg (40 mg Intravenous Given 07/03/18 1006)    Mobility walks Low fall risk   Focused Assessments Pulmonary Assessment Handoff:  Lung sounds: L Breath Sounds: Diminished R Breath Sounds: Diminished O2 Device: Room Air        R Recommendations: See Admitting Provider Note  Report given to:   Additional Notes:   Pt has a productive cough with orthopnea

## 2018-07-03 NOTE — Progress Notes (Signed)
07/03/2018 Patient transfer from Emergency room to [facility] at [facility]. Patient is alert, oriented and ambulatory. Patient skin is intact. She was placed wads placed on telemetry and CHG bath was administered [facility].

## 2018-07-03 NOTE — ED Notes (Signed)
Patient transported to CT 

## 2018-07-04 DIAGNOSIS — R946 Abnormal results of thyroid function studies: Secondary | ICD-10-CM

## 2018-07-04 LAB — BASIC METABOLIC PANEL
Anion gap: 13 (ref 5–15)
BUN: 10 mg/dL (ref 6–20)
CO2: 24 mmol/L (ref 22–32)
Calcium: 9.5 mg/dL (ref 8.9–10.3)
Chloride: 102 mmol/L (ref 98–111)
Creatinine, Ser: 0.71 mg/dL (ref 0.44–1.00)
GFR calc non Af Amer: >60 mL/min (ref >60)
Glucose, Bld: 115 mg/dL — ABNORMAL HIGH (ref 70–99)
Potassium: 3.3 mmol/L — ABNORMAL LOW (ref 3.5–5.1)
SODIUM: 139 mmol/L (ref 135–145)

## 2018-07-04 LAB — CBC
HCT: 34.4 % — ABNORMAL LOW (ref 36.0–46.0)
Hemoglobin: 11 g/dL — ABNORMAL LOW (ref 12.0–15.0)
MCH: 24.7 pg — ABNORMAL LOW (ref 26.0–34.0)
MCHC: 32 g/dL (ref 30.0–36.0)
MCV: 77.1 fL — ABNORMAL LOW (ref 80.0–100.0)
NRBC: 0 % (ref 0.0–0.2)
Platelets: 424 10*3/uL — ABNORMAL HIGH (ref 150–400)
RBC: 4.46 MIL/uL (ref 3.87–5.11)
RDW: 14.8 % (ref 11.5–15.5)
WBC: 9.3 10*3/uL (ref 4.0–10.5)

## 2018-07-04 LAB — HIV ANTIBODY (ROUTINE TESTING W REFLEX): HIV Screen 4th Generation wRfx: NONREACTIVE

## 2018-07-04 MED ORDER — FUROSEMIDE 20 MG PO TABS: ORAL_TABLET | ORAL | 2 refills | Status: DC

## 2018-07-04 MED ORDER — SPIRONOLACTONE 25 MG PO TABS: 12.5000 mg | ORAL_TABLET | Freq: Every day | ORAL | 2 refills | Status: DC

## 2018-07-04 MED ORDER — GUAIFENESIN-CODEINE 100-10 MG/5ML PO SOLN
5.0000 mL | ORAL | Status: DC | PRN
  Administered 2018-07-04 – 2018-07-05 (×3): 5 mL via ORAL
  Filled 2018-07-04 (×3): qty 5

## 2018-07-04 MED ORDER — FUROSEMIDE 10 MG/ML IJ SOLN
40.0000 mg | Freq: Two times a day (BID) | INTRAMUSCULAR | Status: DC
  Administered 2018-07-04 – 2018-07-05 (×3): 40 mg via INTRAVENOUS
  Filled 2018-07-04 (×4): qty 4

## 2018-07-04 MED ORDER — POTASSIUM CHLORIDE CRYS ER 20 MEQ PO TBCR
40.0000 meq | EXTENDED_RELEASE_TABLET | Freq: Two times a day (BID) | ORAL | Status: AC
  Administered 2018-07-04 (×2): 40 meq via ORAL
  Filled 2018-07-04 (×2): qty 2

## 2018-07-04 MED ORDER — SACUBITRIL-VALSARTAN 24-26 MG PO TABS: 1.0000 | ORAL_TABLET | Freq: Two times a day (BID) | ORAL | 2 refills | Status: DC

## 2018-07-04 MED ORDER — AMIODARONE HCL 200 MG PO TABS
200.0000 mg | ORAL_TABLET | Freq: Every day | ORAL | Status: DC
  Administered 2018-07-04 – 2018-07-05 (×2): 200 mg via ORAL
  Filled 2018-07-04 (×2): qty 1

## 2018-07-04 MED ORDER — POTASSIUM CHLORIDE CRYS ER 20 MEQ PO TBCR: 20.0000 meq | EXTENDED_RELEASE_TABLET | Freq: Two times a day (BID) | ORAL | Status: DC

## 2018-07-04 MED ORDER — DIGOXIN 125 MCG PO TABS: 0.1250 mg | ORAL_TABLET | Freq: Every day | ORAL | 2 refills | Status: DC

## 2018-07-04 MED ORDER — LIVING BETTER WITH HEART FAILURE BOOK: Freq: Once | Status: DC

## 2018-07-04 MED ORDER — LACTULOSE 10 GM/15ML PO SOLN
10.0000 g | Freq: Once | ORAL | Status: AC
  Administered 2018-07-04: 10 g via ORAL
  Filled 2018-07-04: qty 15

## 2018-07-04 MED ORDER — SPIRONOLACTONE 12.5 MG HALF TABLET
12.5000 mg | ORAL_TABLET | Freq: Every day | ORAL | Status: DC
  Administered 2018-07-05: 12.5 mg via ORAL
  Filled 2018-07-04: qty 1

## 2018-07-04 MED ORDER — DIGOXIN 125 MCG PO TABS
0.1250 mg | ORAL_TABLET | Freq: Every day | ORAL | Status: DC
  Administered 2018-07-05: 0.125 mg via ORAL
  Filled 2018-07-04: qty 1

## 2018-07-04 MED ORDER — AMIODARONE HCL 200 MG PO TABS: 200.0000 mg | ORAL_TABLET | Freq: Every day | ORAL | 2 refills | Status: DC

## 2018-07-04 MED ORDER — SENNOSIDES-DOCUSATE SODIUM 8.6-50 MG PO TABS: 1.0000 | ORAL_TABLET | Freq: Two times a day (BID) | ORAL | Status: DC | PRN

## 2018-07-04 MED FILL — ENTRESTO 24 MG-26 MG TABLET: 24-26 | 30 days supply | Qty: 60 | Fill #0 | Status: TO

## 2018-07-04 MED FILL — AMIODARONE HCL 200 MG TABS: 200 | 30 days supply | Qty: 30 | Fill #0

## 2018-07-04 MED FILL — SPIRONOLACTONE 25 MG TABLET: 25 | 30 days supply | Qty: 15 | Fill #0

## 2018-07-04 MED FILL — DIGOXIN 0.125 MG TABLET: 125 | 30 days supply | Qty: 30 | Fill #0

## 2018-07-04 MED FILL — FUROSEMIDE 20 MG TAB: 20 | 30 days supply | Qty: 60 | Fill #0

## 2018-07-04 NOTE — TOC Initial Note (Addendum)
Transition of Care [facility]) - Initial/Assessment Note    Patient Details  Name: Tara Bradshaw MRN: [id number] Date of Birth: 02-25-1986  Transition of Care [facility]) CM/SW Contact:    Tara Mayo, RN Phone Number: 07/04/2018, 10:50 AM  Clinical Narrative:                 From home with spouse, and young children, pta indep,  acute heart failure, she states she has no problem getting medications, she has transportation, preferred pharmacy is  [address] she has [name] insurance the [facility], there is a copy of insurance card for that admit.  She does not have a PCP, NCM gave her the [facility] phone to help assist her in getting a pcp in network with [employer].   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   Also NCM contacted the financial aide counselor Tara Bradshaw , she states she was just trying to reach patient, she will run insurance .  Note from financial counselor states patient does not have insurance, NCM will schedule patient a hospital follow up apt.  . Also patient states she does not want HHRN for CHF disease management.  TOC to fill scripts for patient today Friday , patient will pay with card.  She states per financial counselor she has family planning medicaid and will have to apply for regular medicaid.  She will be on Entresto, [facility] pharmacy has the 30 day free card there they will fill for her. Hospital follow up apt made at [facility] , listed on AVS.  Expected Discharge Plan: Home/Self Care Barriers to Discharge: No Barriers Identified   Patient Goals and CMS Choice Patient states their goals for this hospitalization and ongoing recovery are:: what is causing this issue, and do what is needed to fix it   Choice offered to / list presented to : NA  Expected Discharge Plan and Services Expected Discharge Plan: Home/Self Care In-house Referral:  Financial Counselor Discharge Planning Services: CM Consult Post Acute Care Choice: NA Living arrangements for the past 2 months: Single Family Home Expected Discharge Date: 07/07/18                        Prior Living Arrangements/Services Living arrangements for the past 2 months: Single Family Home Lives with:: Spouse, Minor Children   Do you feel safe going back to the place where you live?: Yes               Activities of Daily Living Home Assistive Devices/Equipment: None ADL Screening (condition at time of admission) Patient's cognitive ability adequate to safely complete daily activities?: Yes Is the patient deaf or have difficulty hearing?: No Does the patient have difficulty seeing, even when wearing glasses/contacts?: No Does the patient have difficulty concentrating, remembering, or making decisions?: No Patient able to express need for assistance with ADLs?: Yes Does the patient have difficulty dressing or bathing?: No Independently performs ADLs?: Yes (appropriate for developmental age) Does the patient have difficulty walking or climbing stairs?: No Weakness of Legs: None Weakness of Arms/Hands: None  Permission Sought/Granted                  Emotional Assessment   Attitude/Demeanor/Rapport: Engaged Affect (typically observed): Appropriate Orientation: : Oriented to Self, Oriented to Place, Oriented to  Time,  Oriented to Situation   Psych Involvement: No (comment)  Admission diagnosis:  SOB (shortness of breath) [R06.02] Acute congestive heart failure, unspecified heart failure type ([place]) [I50.9] Acute heart failure ([place]) [I50.9] Patient Active Problem List   Diagnosis Date Noted  . Acute heart failure ([place]) 07/03/2018  . Anemia, postpartum 12/13/2017  . Normal postpartum course 12/13/2017  . Indication for care in labor or delivery 12/11/2017  . Hyperemesis affecting pregnancy, antepartum 07/25/2017  . Dehydration during pregnancy  07/23/2017  . Obesity, Class III, BMI 40-49.9 (morbid obesity) ([place]) 11/26/2016  . GERD (gastroesophageal reflux disease) 11/26/2016   PCP:  Patient, No Pcp Per Pharmacy:   [id number] [place], [address] [place] [address] Phone: [phone] Fax: [phone]  [id number] - [place], [place] [address] [place] [address] Phone: [phone] Fax: [phone]     Social Determinants of Health (SDOH) Interventions    Readmission Risk Interventions No flowsheet data found.

## 2018-07-04 NOTE — Progress Notes (Signed)
Daily Nursing Note  Report received from [place], [place]. Introduced self to patient who verbalized having a harse cough overnight which inhibited her from sleeping. Requested that the team consider an alternative coughing agent --> told that a cough suppressant (containing codeine) will be ordered.  Patient stated that this diagnosis of heart failure is new to her and she is unsure of what to expect progressing forward. Will provide initial HF education to patient in terms of daily weights/volume restriction/salt restriction. KCL dosing clarified as 54mq this AM. Patient received lasix with excellent output. I/O (-) 2,970. All needs met during shift.   Pending Test: Novel Coronavirus (SARS-CoV-2 NAAT)  DC Plan: Discharge to home tomorrow if continues to improve and formalized plan confirmed with Cariology.

## 2018-07-04 NOTE — Progress Notes (Addendum)
     Subjective: Patient states that she feels much better today and that her shortness of breath is improving.  She still complains of occasional coughing fits but this is improving as well.  Objective:  Vital signs in last 24 hours: Vitals:   07/03/18 2318 07/03/18 2328 07/04/18 0527 07/04/18 0748  BP: (!) 127/97 (!) 128/97  120/82  Pulse: (!) 102 (!) 115  94  Resp:    16  Temp: 98.6 F (37 C) 98.6 F (37 C)    TempSrc: Oral Oral    SpO2: 100% 97%  96%  Weight:   112.1 kg   Height:       General: Awake, alert, oriented x3, NAD CVS: Regular rate and rhythm, normal heart sounds Lungs: Diminished breath sounds at the bases, scattered crackles noted bilaterally Abdomen: Soft, nontender, nondistended, normoactive bowel sounds Extremities: No edema noted  Assessment/Plan:  Active Problems:   Acute heart failure [facility])  Patient is a 33 year old female with no significant past medical history who presented to the ED with progressively worsening shortness of breath and was found to be in new onset heart failure  1.  New onset heart failure: -Patient presented with worsening shortness of breath and had bilateral infiltrates on her CT which were consistent with edema versus infection.  However, on point-of-care ultrasound she was noted to have moderate to severely reduced EF with a dilated left ventricle consistent with systolic dysfunction. -Heart failure follow-up and recommendations appreciated -We will continue with IV diuresis for now with Lasix 40 mg twice daily -Supplement potassium as needed -Continue with Entresto, digoxin and spironolactone per cardiology -We will hold off on beta-blocker for now given acute heart failure -The etiology of the patient's new onset heart failure remains uncertain at this time (possible viral induced cardiomyopathy versus postpartum cardiomyopathy).  Will follow up COVID-19 testing.  RVP was positive for rhinovirus but uncertain of the  significance of this at this point. -Patient also noted to have decreased TSH.  We will follow-up repeat TSH and free T4 to rule out hyperthyroidism. -We will consider adding codeine for her coughing fits -No further work-up at this time.  Resident will call patient's husband to update him as to her status  Dispo: Anticipated discharge in approximately 1-2 day(s).   Aldine Contes, MD 07/04/2018, 10:32 AM

## 2018-07-04 NOTE — Progress Notes (Addendum)
Advanced Heart Failure Rounding Note   Subjective:    Diuresing very with IV lasix - 4.1L overnight. Weight only down 1 pound. SOB much improved. Still with cough. Mild orthopnea. No CP. Afebrile.   HR improved    Objective:   Weight Range:  Vital Signs:   Temp:  [98.6 F (37 C)] 98.6 F (37 C) (03/26 2328) Pulse Rate:  [65-115] 94 (03/27 0748) Resp:  [13-27] 16 (03/27 0748) BP: (120-136)/(82-101) 120/82 (03/27 0748) SpO2:  [96 %-100 %] 96 % (03/27 0748) Weight:  [112.1 kg-112.8 kg] 112.1 kg (03/27 0527) Last BM Date: 07/01/18  Weight change: Filed Weights   07/03/18 1833 07/04/18 0527  Weight: 112.8 kg 112.1 kg    Intake/Output:   Intake/Output Summary (Last 24 hours) at 07/04/2018 1116 Last data filed at 07/04/2018 0918 Gross per 24 hour  Intake 603 ml  Output 4800 ml  Net -4197 ml     Physical Exam: General:  Sitting in bed + cough No resp difficulty HEENT: normal Neck: supple. JVP hard to see but appears mildly elevated  Carotids 2+ bilat; no bruits. No lymphadenopathy or thryomegaly appreciated. Cor: PMI nondisplaced. Regular rate & rhythm. No rubs, gallops or murmurs. Lungs: clear Abdomen: obese soft, nontender, nondistended. No hepatosplenomegaly. No bruits or masses. Good bowel sounds. Extremities: no cyanosis, clubbing, rash, edema Neuro: alert & orientedx3, cranial nerves grossly intact. moves all 4 extremities w/o difficulty. Affect pleasant   Telemetry: NSR 90s + PVCs (about 8-10/hr) Personally reviewed   Labs: Basic Metabolic Panel: Recent Labs  Lab 07/03/18 0845 07/04/18 0211  NA 140 139  K 3.8 3.3*  CL 109 102  CO2 20* 24  GLUCOSE 101* 115*  BUN 11 10  CREATININE 0.64 0.71  CALCIUM 9.4 9.5    Liver Function Tests: Recent Labs  Lab 07/03/18 0845  AST 48*  ALT 62*  ALKPHOS 70  BILITOT 0.4  PROT 6.5  ALBUMIN 3.7   No results for input(s): LIPASE, AMYLASE in the last 168 hours. No results for input(s): AMMONIA in the  last 168 hours.  CBC: Recent Labs  Lab 07/03/18 0845 07/04/18 0211  WBC 10.1 9.3  NEUTROABS 6.3  --   HGB 10.9* 11.0*  HCT 36.0 34.4*  MCV 80.7 77.1*  PLT 447* 424*    Cardiac Enzymes: Recent Labs  Lab 07/03/18 0845  TROPONINI <0.03    BNP: BNP (last 3 results) Recent Labs    07/03/18 0845  BNP 1,021.4*    ProBNP (last 3 results) No results for input(s): PROBNP in the last 8760 hours.    Other results:  Imaging: Ct Chest Wo Contrast  Result Date: 07/03/2018 CLINICAL DATA:  Worsening cough over 2 weeks EXAM: CT CHEST WITHOUT CONTRAST TECHNIQUE: Multidetector CT imaging of the chest was performed following the standard protocol without IV contrast. Sagittal and coronal MPR images reconstructed from axial data set. COMPARISON:  Chest radiograph 07/03/2018 FINDINGS: Cardiovascular: Aorta normal caliber. Cardiac chambers appear enlarged. No significant pericardial effusion. Mediastinum/Nodes: Minimal residual thymic tissue in anterior mediastinum. Base of cervical region normal appearance. Esophagus unremarkable. Lungs/Pleura: Small BILATERAL pleural effusions slightly larger on RIGHT. Scattered central peribronchovascular infiltrates in RIGHT upper and RIGHT lower lobes, little in RIGHT middle lobe. Additional minimal infiltrate in LEFT upper and LEFT lower lobes. Calcified granuloma anterior RIGHT upper lobe image 66. Minimal peribronchial thickening. No pneumothorax. Upper Abdomen: Unremarkable Musculoskeletal: Unremarkable. IMPRESSION: Enlargement of cardiac chambers. Patchy BILATERAL central/peribronchovascular infiltrates asymmetrically greater in RIGHT lung, nonspecific,  could represent edema or infection. Associated small BILATERAL pleural effusions slightly larger on RIGHT. Electronically Signed   By: Lavonia Dana M.D.   On: 07/03/2018 16:17   Dg Chest Portable 1 View  Result Date: 07/03/2018 CLINICAL DATA:  Cough, dyspnea. EXAM: PORTABLE CHEST 1 VIEW COMPARISON:   Radiographs of May 08, 2007. FINDINGS: Enlarged cardiac silhouette is noted suggesting underlying cardiomegaly or possibly pericardial effusion. No pneumothorax or pleural effusion is noted. Mild reticular opacities are noted throughout both lungs, with right greater than left. Is uncertain if this represents edema or possibly atypical inflammation. Focal airspace opacity is noted in right upper lobe which may represent possible pneumonia or atelectasis. Bony thorax is unremarkable. IMPRESSION: Interval enlargement of cardiac silhouette is noted suggesting underlying cardiomegaly or possibly pericardial effusion. Increased interstitial densities are noted throughout both lungs, right greater than left, which may represent edema or possibly atypical inflammation. Focal ill-defined airspace opacity is noted in right upper lobe which may represent pneumonia or atelectasis. Electronically Signed   By: Marijo Conception, M.D.   On: 07/03/2018 09:10      Medications:     Scheduled Medications: . [START ON 07/05/2018] digoxin  0.125 mg Oral Daily  . enoxaparin (LOVENOX) injection  40 mg Subcutaneous Q24H  . furosemide  40 mg Intravenous BID  . Living Better with Heart Failure Book   Does not apply Once  . potassium chloride  40 mEq Oral BID  . sacubitril-valsartan  1 tablet Oral BID  . sodium chloride flush  3 mL Intravenous Q12H  . [START ON 07/05/2018] spironolactone  12.5 mg Oral Daily     Infusions:   PRN Medications:  acetaminophen **OR** acetaminophen, benzonatate, guaiFENesin-codeine, polyethylene glycol, senna-docusate   Assessment:   33 y/o woman with no PMHx now 7 months post-partum presents with acute systolic HF EF 58%.  Plan/Discussion:     1. Acute systolic HF - Bedside echo LVEF 25% with moderate RV dysfunction - Almost certainly NICM. Either viral (hadURI last week and respiratory panel + rhinovirus) or post-partum or PVC cardiomyopathy (less likely but will quantify  PVCs on tele) - Now NYHA IV with volume overload and prominent tachycardia - Remains volume overloaded. Will continue IV diuresis with lasix 40 IV bid - Continue entresto 25/26 bid, digoxin and spiro - No b-blocker yet with possible marginal cardiac output - Will eventually need cMRI - Discussed need to avoid further pregnancies and cease breastfeeding. (she is not planning on having more children and currently has IUD in place) - Plan d/w IMTS - Will need CM help if she is to be able to afford Entresto   2. Acute hypoxic respiratory failure - likely due to HF but also likely has component of URI. Being ruled out for COVID. Respiratory panel + for rhinovirus - CT chest looks most c/w HF - should improve with diuresis   3. Snoring - sounds like she has OSA. Will need outpatient PSG  4. Hypokalemia - will supp. Continue spiro.   5. PVCs - Averaging about 8-10 PVCs per minute. May be contributing to LV dysfunction - Keep K > 4.0 Mg > 2.0 - Add low dose amio 200 daily for suppression  If improved may be able to go home tomorrow on  Entresto 24/26 bid Spiro 12.5 daily Amio 200 daily Digoxin 0.125 daily Lasix 40 daily with extra 40 as needed for weight gain  Will need telehealth f/u with HF team   Length of Stay: 1  Glori Bickers MD 07/04/2018, 11:16 AM  Advanced Heart Failure Team Pager [phone] (M-F; 7a - 4p)  Please contact [facility] for night-coverage after hours (4p -7a ) and weekends on [email or web address]

## 2018-07-04 NOTE — Progress Notes (Addendum)
Nutrition Consult/Education Note  RD working remotely  Nutrition consulted for new onset heart failure education. RD spoke with patient over her room phone. She is very pleasant and positive.  Reviewed patient's dietary recall. Provided examples on ways to decrease sodium intake in diet. Discouraged intake of processed foods and use of salt shaker. Encouraged fresh fruits and vegetables as well as whole grain sources of carbohydrates to maximize fiber intake.   Teach back method used. Expect good compliance.  Body mass index is 46.7 kg/m.  Pt meets criteria for Obesity Class III based on current BMI.  Current diet order is Heart Healthy.  Labs and medications reviewed.  No further interventions warranted at this time.   RD will mail pt "Low Sodium Nutrition Therapy" handout from the Academy of Nutrition [employer] Dietetics.  Address obtained ([address] [place], [place] [address]).  Arthur Holms, RD, LDN Pager #: [phone] After-Hours Pager #: [phone]

## 2018-07-05 DIAGNOSIS — E02 Subclinical iodine-deficiency hypothyroidism: Secondary | ICD-10-CM

## 2018-07-05 DIAGNOSIS — I5021 Acute systolic (congestive) heart failure: Principal | ICD-10-CM

## 2018-07-05 DIAGNOSIS — D649 Anemia, unspecified: Secondary | ICD-10-CM

## 2018-07-05 DIAGNOSIS — Z6841 Body Mass Index (BMI) 40.0 and over, adult: Secondary | ICD-10-CM

## 2018-07-05 DIAGNOSIS — E0590 Thyrotoxicosis, unspecified without thyrotoxic crisis or storm: Secondary | ICD-10-CM | POA: Diagnosis present

## 2018-07-05 DIAGNOSIS — R748 Abnormal levels of other serum enzymes: Secondary | ICD-10-CM | POA: Diagnosis present

## 2018-07-05 DIAGNOSIS — J9601 Acute respiratory failure with hypoxia: Secondary | ICD-10-CM

## 2018-07-05 DIAGNOSIS — R740 Nonspecific elevation of levels of transaminase and lactic acid dehydrogenase [LDH]: Secondary | ICD-10-CM

## 2018-07-05 DIAGNOSIS — Z79899 Other long term (current) drug therapy: Secondary | ICD-10-CM

## 2018-07-05 DIAGNOSIS — O9081 Anemia of the puerperium: Secondary | ICD-10-CM

## 2018-07-05 DIAGNOSIS — I493 Ventricular premature depolarization: Secondary | ICD-10-CM

## 2018-07-05 LAB — BASIC METABOLIC PANEL
Anion gap: 11 (ref 5–15)
BUN: 10 mg/dL (ref 6–20)
CO2: 25 mmol/L (ref 22–32)
Calcium: 9.3 mg/dL (ref 8.9–10.3)
Chloride: 100 mmol/L (ref 98–111)
Creatinine, Ser: 0.82 mg/dL (ref 0.44–1.00)
GFR calc Af Amer: >60 mL/min (ref >60)
GFR calc non Af Amer: >60 mL/min (ref >60)
Glucose, Bld: 122 mg/dL — ABNORMAL HIGH (ref 70–99)
Potassium: 3.3 mmol/L — ABNORMAL LOW (ref 3.5–5.1)
Sodium: 136 mmol/L (ref 135–145)

## 2018-07-05 LAB — CBC WITH DIFFERENTIAL/PLATELET
Abs Immature Granulocytes: 0.02 10*3/uL (ref 0.00–0.07)
Basophils Absolute: 0 10*3/uL (ref 0.0–0.1)
Basophils Relative: 1 %
Eosinophils Absolute: 0.3 10*3/uL (ref 0.0–0.5)
Eosinophils Relative: 4 %
HCT: 39.7 % (ref 36.0–46.0)
Hemoglobin: 12.6 g/dL (ref 12.0–15.0)
Immature Granulocytes: 0 %
Lymphocytes Relative: 42 %
Lymphs Abs: 3.3 10*3/uL (ref 0.7–4.0)
MCH: 25 pg — AB (ref 26.0–34.0)
MCHC: 31.7 g/dL (ref 30.0–36.0)
MCV: 78.6 fL — ABNORMAL LOW (ref 80.0–100.0)
Monocytes Absolute: 0.5 10*3/uL (ref 0.1–1.0)
Monocytes Relative: 7 %
Neutro Abs: 3.6 10*3/uL (ref 1.7–7.7)
Neutrophils Relative %: 46 %
Platelets: 445 10*3/uL — ABNORMAL HIGH (ref 150–400)
RBC: 5.05 MIL/uL (ref 3.87–5.11)
RDW: 15.3 % (ref 11.5–15.5)
WBC: 7.9 10*3/uL (ref 4.0–10.5)
nRBC: 0 % (ref 0.0–0.2)

## 2018-07-05 LAB — TSH: TSH: 0.235 u[IU]/mL — ABNORMAL LOW (ref 0.350–4.500)

## 2018-07-05 LAB — T4, FREE: Free T4: 0.97 ng/dL (ref 0.82–1.77)

## 2018-07-05 LAB — MAGNESIUM: Magnesium: 1.7 mg/dL (ref 1.7–2.4)

## 2018-07-05 MED ORDER — MAGNESIUM SULFATE 2 GM/50ML IV SOLN
2.0000 g | Freq: Once | INTRAVENOUS | Status: AC
  Administered 2018-07-05: 2 g via INTRAVENOUS
  Filled 2018-07-05: qty 50

## 2018-07-05 MED ORDER — GUAIFENESIN-CODEINE 100-10 MG/5ML PO SOLN: 5.0000 mL | ORAL | 0 refills | Status: DC | PRN

## 2018-07-05 MED ORDER — POTASSIUM CHLORIDE CRYS ER 20 MEQ PO TBCR
60.0000 meq | EXTENDED_RELEASE_TABLET | Freq: Once | ORAL | Status: AC
  Administered 2018-07-05: 60 meq via ORAL
  Filled 2018-07-05: qty 3

## 2018-07-05 NOTE — Progress Notes (Signed)
     Subjective: Patient reports feeling much better her shortness of breath is greatly improved she was able to lie her hospital bed flat without any orthopnea.  Lower extremity edema has also improved.  Objective:  Vital signs in last 24 hours: Vitals:   07/04/18 2255 07/05/18 0444 07/05/18 0753 07/05/18 0836  BP: 107/84   103/79  Pulse: 83  83 86  Resp: 18     Temp: 98.1 F (36.7 C)   97.7 F (36.5 C)  TempSrc: Oral   Oral  SpO2: 90%   99%  Weight:  111.7 kg    Height:       General: Awake, alert, oriented x3, NAD CVS: Regular rate and rhythm, normal heart sounds Lungs: diminished at bases otherwise clear Abdomen: Soft, nontender, nondistended, normoactive bowel sounds Extremities: trace LE edema  Assessment/Plan:  Principal Problem:   Acute heart failure (HCC) Active Problems:   Obesity, Class III, BMI 40-49.9 (morbid obesity) (HCC)   Anemia, postpartum   Subclinical hyperthyroidism   Abnormal transaminases  Patient is a 33 year old female with no significant past medical history who presented to the ED with progressively worsening shortness of breath and was found to be in new onset heart failure  1.  Acute systolic heart failure: Patient is doing much better we will plan to transition care to outpatient. -Continue with Entresto, digoxin and spironolactone per cardiology -We will hold off on beta-blocker for now given acute heart failure -The etiology of the patient's new onset heart failure remains uncertain at this time (possible viral induced cardiomyopathy versus postpartum cardiomyopathy).  Will follow up COVID-19 testing.  RVP was positive for rhinovirus but uncertain of the significance of this at this point.  2. Subclinical Hypothyroidism - T3 still pending but T4 normal. Will need repeat testing in 6 months  3. Abnormal transaminases -Discussed would like to check hepatitis B and C however since she is otherwise stable for discharge and there is no acute  issues I think this can be reasonably deferred to the outpatient setting I discussed this with the patient.  4.  Postpartum anemia-seems to be improving had mildly low MCV and mild thrombocytosis consistent with previous history of iron deficiency anemia.  Again I would potentially check iron studies however patient stable for discharge I discussed this with her we will plan for her to continue oral iron at this time and follow-up with her outpatient provider.  5. Morbid obesity -Needs sleep study.  6. PVCs - potassium and mag replaced, now on low dose amiodarone. Stable   Dispo: discharge home today, COVID testing still pending, has been instructed to maintain quarantine at home.  Lucious Groves, DO 07/05/2018, 12:26 PM

## 2018-07-05 NOTE — Discharge Summary (Signed)
Name: Tara Bradshaw MRN: [id number] DOB: [birth year] 33 y.o. PCP: Patient, No Pcp Per  Date of Admission: 07/03/2018  7:59 AM Date of Discharge: 07/05/2018 Attending Physician: Dr. Johnnette Gourd 1. Acute heart failure (HCC) 2. Obesity, Class III, BMI 40-49.9 (morbid obesity) ([place]) 3. Subclinical hyperthyroidism 4. Abnormal transaminases  Discharge Medications: Allergies as of 07/05/2018   No Known Allergies     Medication List    STOP taking these medications   amoxicillin 875 MG tablet Commonly known as:  AMOXIL     TAKE these medications   amiodarone 200 MG tablet Commonly known as:  PACERONE Take 1 tablet (200 mg total) by mouth daily.   benzonatate 200 MG capsule Commonly known as:  TESSALON Take 1 capsule (200 mg total) by mouth 2 (two) times daily as needed for cough.   digoxin 0.125 MG tablet Commonly known as:  LANOXIN Take 1 tablet (0.125 mg total) by mouth daily.   furosemide 20 MG tablet Commonly known as:  Lasix Take 1 tablet daily. Take an additional tablet daily as needed for greater than 2lbs weight gain in 24 hours   guaiFENesin-codeine 100-10 MG/5ML syrup Take 5 mLs by mouth every 4 (four) hours as needed for cough.   sacubitril-valsartan 24-26 MG Commonly known as:  ENTRESTO Take 1 tablet by mouth 2 (two) times daily.   spironolactone 25 MG tablet Commonly known as:  ALDACTONE Take 0.5 tablets (12.5 mg total) by mouth daily.      Disposition and follow-up:   TaraAshlee L Bradshaw was discharged from [facility] in Stable condition.  At the hospital follow up visit please address:  1.  Acute systolic heart failure: New diagnosis of HFrEF with an LVEF of 25% (bedside echo by Dr. Haroldine Laws due to COVID-19 risk) and moderate RV dysfunction felt to be 2/2 to a viral process vs post-partum. Followed by advanced heart failure team while admitted. Stated on Entresto, sprionolactone, amiodarone, digoxin and lasix. Please assess  adherence and affordability.   Acute hypoxic respiratory failure: Mild. Likely viral in etiology, patient was rhinovirus positive, which is uncommonly a source of such illness. As such, and given the case reports of COVID-19 induced cardiomyopathy she was COVID tested, results not available at discharge.  2.  Labs / imaging needed at time of follow-up: BMP, (TSH, T4 in 6 months)  3.  Pending labs/ test needing follow-up: COVID-19 testing  Follow-up Appointments: Follow-up Information    [facility] Follow up on 07/17/2018.   Specialty:  Family Medicine Why:  10:30 for hospital follow up Contact information: [place] [address] [phone]       [facility]. Schedule an appointment as soon as possible for a visit.   Contact information: [place] [address] [place] Hospital Course by problem list: Tara Bradshaw is a 33 year old female with no significant past medical history who presented to the ED with progressively worsening shortness of breath and was found to be in new onset heart failure  Acute systolic heart failure: She improved rapidly with regard to her dyspnea and was doing much better overall. She was discharged to follow-up as an outpatient. She will need a repeat Echo in the next 3-6 months to monitor her heart failure and potential improvement. She is to continue with Entresto, digoxin and spironolactone per cardiology at discharge.  Subclinical  Hypothyroidism TSH below normal limits. T3 still pending but T4 normal. Will need repeat testing in 6 months.  Abnormal transaminases -Discussed would like to check hepatitis B and C however since she is otherwise stable for discharge and there is no acute issues I think this can be reasonably deferred to the outpatient setting I discussed this with the  patient.   Morbid obesity Needs sleep study to rule out/in OSA given her CHF. Untreated OSA would rapidly worsen her CHF and lead to early onset low output state.  PVCs Noted on telemetry. Likely a result of the congestive heart failure and cardiomyopathy as opposed to the cause. Electrolytes potassium and magnesium replaced, now on low dose amiodarone. Will need BMP as well as magnesium and EKG if indicated at outpatient follow-up.  Discharge Vitals:   BP 103/79 (BP Location: Left Arm)   Pulse 86   Temp 97.7 F (36.5 C) (Oral)   Resp 18   Ht 5\' 1"  (1.549 m)   Wt 111.7 kg   LMP 06/27/2018   SpO2 99%   BMI 46.52 kg/m   Pertinent Labs, Studies, and Procedures:  CMP Latest Ref Rng & Units 07/05/2018 07/04/2018 07/03/2018  Glucose 70 - 99 mg/dL 122(H) 115(H) 101(H)  BUN 6 - 20 mg/dL 10 10 11   Creatinine 0.44 - 1.00 mg/dL 0.82 0.71 0.64  Sodium 135 - 145 mmol/L 136 139 140  Potassium 3.5 - 5.1 mmol/L 3.3(L) 3.3(L) 3.8  Chloride 98 - 111 mmol/L 100 102 109  CO2 22 - 32 mmol/L 25 24 20(L)  Calcium 8.9 - 10.3 mg/dL 9.3 9.5 9.4  Total Protein 6.5 - 8.1 g/dL - - 6.5  Total Bilirubin 0.3 - 1.2 mg/dL - - 0.4  Alkaline Phos 38 - 126 U/L - - 70  AST 15 - 41 U/L - - 48(H)  ALT 0 - 44 U/L - - 62(H)   CBC Latest Ref Rng & Units 07/05/2018 07/04/2018 07/03/2018  WBC 4.0 - 10.5 K/uL 7.9 9.3 10.1  Hemoglobin 12.0 - 15.0 g/dL 12.6 11.0(L) 10.9(L)  Hematocrit 36.0 - 46.0 % 39.7 34.4(L) 36.0  Platelets 150 - 400 K/uL 445(H) 424(H) 447(H)    Discharge Instructions: Discharge Instructions    (HEART FAILURE PATIENTS) Call MD:  Anytime you have any of the following symptoms: 1) 3 pound weight gain in 24 hours or 5 pounds in 1 week 2) shortness of breath, with or without a dry hacking cough 3) swelling in the hands, feet or stomach 4) if you have to sleep on extra pillows at night in order to breathe.   Complete by:  As directed    Diet - low sodium heart healthy   Complete by:  As directed     Discharge instructions   Complete by:  As directed    COVD testing pending. Patient will need to quarantine herself from her child and other family members until her testing returns. This is imperative to protect her child and prevent transmission. She is to wash her hands regularly, disinfect anything she touches regularly and be in a separate room at least 6 feet away from other people. In addition, she should wear a surgical mask while being transported home.  She has been started on several new medications and will need to follow-up with cardiology.  She will need to follow-up with her PCP as well.   Increase activity slowly   Complete by:  As directed       Signed: Kathi Ludwig, MD 07/06/2018,  12:18 PM   Pager: Pager# [phone]

## 2018-07-05 NOTE — Progress Notes (Signed)
Daily Nursing Note  Report received from [place], [place]. Introduced self to patient who endorsed she slept much better last night in the setting of receiving cough syrup with codeine. Patient vocalized being hopeful that she may be able to discharge today. AM medications given. Talked to attending physician who was rounding on patient and alerted him about ventricular bigeminies that monitor room had called about. No additional orders as patient received K/Mg repletion this AM. Discharge education reviewed with patient. Patient educated on where to call for COVID-19 results. Reviewed the "Coronavirus Disease 2019 (COVID-19) Guidance for Persons Under Investigation" paperwork with the patient and obtained signature of understanding. HF education started with patient, "Up To Date" patient education printout handed to patient. All questions answered and patient discharged to husbands care for transport home.    Pending Test: Novel Coronavirus (SARS-CoV-2 NAAT)  DC Plan: Discharge to home this afternoon.

## 2018-07-05 NOTE — Care Management (Signed)
Spoke w patient over the phone. She states that she has received her medications for DC through [facility]. No other CM needs.

## 2018-07-07 ENCOUNTER — Telehealth (HOSPITAL_COMMUNITY): Payer: Self-pay | Admitting: Licensed Clinical Social Worker

## 2018-07-07 LAB — T3, FREE: T3, Free: 3.4 pg/mL (ref 2.0–4.4)

## 2018-07-07 LAB — NOVEL CORONAVIRUS, NAA (HOSPITAL ORDER, SEND-OUT TO REF LAB): SARS-COV-2, NAA: NOT DETECTED

## 2018-07-07 LAB — NOVEL CORONAVIRUS, NAA (HOSP ORDER, SEND-OUT TO REF LAB; TAT 18-24 HRS)

## 2018-07-07 NOTE — Telephone Encounter (Signed)
CSW consulted to assist patient with [employer] application to receive help with Entresto.  CSW discussed application process with pt and emailed her a copy of the application- pt to return application to clinic once her portion is complete  CSW will continue to follow and assist as needed.  Jorge Ny, LCSW Clinical Social Worker [facility] Clinic Desk#: [phone] Cell#: [phone]

## 2018-07-08 LAB — CULTURE, BLOOD (ROUTINE X 2)
Culture: NO GROWTH
Culture: NO GROWTH
Special Requests: ADEQUATE

## 2018-07-10 ENCOUNTER — Telehealth (HOSPITAL_COMMUNITY): Payer: Self-pay | Admitting: Cardiology

## 2018-07-10 NOTE — Telephone Encounter (Signed)
Left message for pt re f/u visit.

## 2018-07-17 ENCOUNTER — Inpatient Hospital Stay (INDEPENDENT_AMBULATORY_CARE_PROVIDER_SITE_OTHER): Payer: 59 | Admitting: Primary Care

## 2018-07-17 ENCOUNTER — Inpatient Hospital Stay: Payer: 59 | Admitting: Primary Care

## 2018-07-18 ENCOUNTER — Ambulatory Visit (HOSPITAL_COMMUNITY)
Admission: RE | Admit: 2018-07-18 | Discharge: 2018-07-18 | Disposition: A | Discharge status: Home / Self Care | Payer: 59 | Source: Ambulatory Visit | Attending: Internal Medicine | Admitting: Internal Medicine

## 2018-07-18 ENCOUNTER — Other Ambulatory Visit: Payer: Self-pay

## 2018-07-18 DIAGNOSIS — I493 Ventricular premature depolarization: Secondary | ICD-10-CM | POA: Diagnosis not present

## 2018-07-18 DIAGNOSIS — I5022 Chronic systolic (congestive) heart failure: Secondary | ICD-10-CM

## 2018-07-18 DIAGNOSIS — R0683 Snoring: Secondary | ICD-10-CM

## 2018-07-18 NOTE — Progress Notes (Signed)
Spoke with pt about after visit summary instruction. Advised that we will be in contact with her about the one time hhrn visit. Schedule f/u visit. Denies needs for any cardiac meds. No further questions.

## 2018-07-18 NOTE — Patient Instructions (Signed)
You will lab work drawn by a 1 time home health nurse. We will be in contact with you in order to set that up.   Please follow up with Dr. Haroldine Laws with a telehealth telephon visit in 2 weeks. This is scheduled for April 24th at 9:00am.

## 2018-07-18 NOTE — Progress Notes (Signed)
Heart Failure TeleHealth Note  Due to national recommendations of social distancing due to [place] 19, Audio/video telehealth visit is felt to be most appropriate for this patient at this time.  See MyChart message from today for patient consent regarding telehealth for [facility].  Date:  07/18/2018   ID:  [name], DOB 1985/08/20, MRN [id number]  Location: Home  Provider location: [facility] Type of Visit: Established patient  PCP:  Patient, No Pcp Per  Cardiologist:  No primary care provider on file. Primary HF: Zacheriah Stumpe  Chief Complaint: Heart Failure follow-up   History of Present Illness:  [name] is a 33 y/o woman with h/o mild HTN who was admitted in 3/20 with acute systolic HF with EF 74-12% in setting of recent viral illness and 15-month post-partum status. COVID testing negative.   Who is scheduled today for a telehealth visit via audio/video conferencing.   Says she is feeling ok. Still tired at times. Feels a lot better than previous though. Denies swelling. Still with cough. Weight is the same from hospital d/c +/- 1 pound. Still with mild orthopnea. No PND or edema. No CP. No fevers. Has not taken her BP. Can do ADLs but has to take breaks as she gets winded. No dizziness.      Robecca L Holness denies symptoms worrisome for COVID 19.   Past Medical History:  Diagnosis Date  . Anemia    CHRONIC  . GERD (gastroesophageal reflux disease)    WITH PREGNANCY  . Pneumonia    x 1   Past Surgical History:  Procedure Laterality Date  . DILATION AND EVACUATION N/A 11/30/2016   Procedure: DILATATION AND EVACUATION;  Surgeon: Donnamae Jude, MD;  Location: [facility];  Service: Gynecology;  Laterality: N/A;  . WISDOM TOOTH EXTRACTION     NO ANESTHESIA     Current Outpatient Medications  Medication Sig Dispense Refill  . amiodarone (PACERONE) 200 MG tablet Take 1 tablet (200 mg total) by mouth daily. 30 tablet 2  .  benzonatate (TESSALON) 200 MG capsule Take 1 capsule (200 mg total) by mouth 2 (two) times daily as needed for cough. 20 capsule 0  . digoxin (LANOXIN) 0.125 MG tablet Take 1 tablet (0.125 mg total) by mouth daily. 30 tablet 2  . furosemide (LASIX) 20 MG tablet Take 1 tablet daily. Take an additional tablet daily as needed for greater than 2lbs weight gain in 24 hours 30 tablet 2  . guaiFENesin-codeine 100-10 MG/5ML syrup Take 5 mLs by mouth every 4 (four) hours as needed for cough. 120 mL 0  . sacubitril-valsartan (ENTRESTO) 24-26 MG Take 1 tablet by mouth 2 (two) times daily. 60 tablet 2  . spironolactone (ALDACTONE) 25 MG tablet Take 0.5 tablets (12.5 mg total) by mouth daily. 15 tablet 2   No current facility-administered medications for this encounter.     Allergies:   Patient has no known allergies.   Social History:  The patient  reports that she has never smoked. She has never used smokeless tobacco. She reports that she does not drink alcohol or use drugs.   Family History:  The patient's family history includes Asthma in her mother; Cancer in her paternal grandmother; Cancer (age of onset: 17) in her maternal aunt; Epilepsy in her mother; Hypertension in her maternal grandmother.   ROS:  Please see the history of present illness.   All other systems are personally reviewed and negative.  Exam:  (Video/Tele Health Call; Exam is subjective and or/visual.) General:  Speaks in full sentences. No resp difficulty. Lungs: Normal respiratory effort with conversation.  Abdomen: Non-distended per patient report Extremities: Pt denies edema. Neuro: Alert & oriented x 3.   Recent Labs: 07/03/2018: ALT 62; B Natriuretic Peptide 1,021.4 07/05/2018: BUN 10; Creatinine, Ser 0.82; Hemoglobin 12.6; Magnesium 1.7; Platelets 445; Potassium 3.3; Sodium 136; TSH 0.235  Personally reviewed   Wt Readings from Last 3 Encounters:  07/05/18 111.7 kg (246 lb 3.2 oz)  12/11/17 116.6 kg (257 lb 1.9 oz)   11/29/17 117.5 kg (259 lb)      ASSESSMENT AND PLAN:  1. Acute systolic HF - New onset 0/17. Bedside echoLVEF 25% with moderate RV dysfunction - Almost certainly NICM. Either viral (hadURI last week and respiratory panel + rhinovirus) or post-partum or PVC cardiomyopathy - Weight stable  - NYHA III - Continue entresto 25/26 bid - would like to titrate but need vitals - Digoxin 0.125 - Spiro 12.5 - Continue lasix 40mg  daily. Counseled on need to take extra lasix for weight gain or increasing symptoms.  - Will eventually need cMRI - Discussed need to avoid further pregnancies and cease breastfeeding. (she is not planning on having more children and currently has IUD in place) - Will arrange RN home visit next week to see her,   2. Snoring - sounds like she has OSA. Will need outpatient PSG  3. PVCs - In hospital was averaging about 8-10 PVCs per minute. May be contributing to LV dysfunction - Continue low dose amio 200 daily for suppression   COVID screen The patient does not have any symptoms that suggest any further testing/ screening at this time.  Social distancing reinforced today.  Recommended follow-up:  As above  Relevant cardiac medications were reviewed at length with the patient today.   The patient does not have concerns regarding their medications at this time.   The following changes were made today:  As above  Today, I have spent 12 minutes with the patient with telehealth technology discussing the above issues .    Signed, Glori Bickers, MD  07/18/2018 8:55 AM  Advanced Heart Failure [place] [address] [phone] (office) [phone] (fax)

## 2018-07-18 NOTE — Addendum Note (Signed)
Encounter addended by: Marlise Eves, RN on: 07/18/2018 1:58 PM  Actions taken: Clinical Note Signed

## 2018-07-25 ENCOUNTER — Telehealth: Payer: Self-pay | Admitting: Primary Care

## 2018-07-25 ENCOUNTER — Ambulatory Visit: Payer: 59 | Attending: Primary Care | Admitting: Primary Care

## 2018-07-25 ENCOUNTER — Other Ambulatory Visit: Payer: Self-pay

## 2018-07-25 ENCOUNTER — Encounter: Payer: Self-pay | Admitting: Primary Care

## 2018-07-25 DIAGNOSIS — I5021 Acute systolic (congestive) heart failure: Secondary | ICD-10-CM | POA: Diagnosis not present

## 2018-07-25 DIAGNOSIS — J302 Other seasonal allergic rhinitis: Secondary | ICD-10-CM | POA: Diagnosis not present

## 2018-07-25 DIAGNOSIS — Z7689 Persons encountering health services in other specified circumstances: Secondary | ICD-10-CM

## 2018-07-25 DIAGNOSIS — R05 Cough: Secondary | ICD-10-CM | POA: Diagnosis not present

## 2018-07-25 MED ORDER — CETIRIZINE HCL 10 MG PO TABS: 10.0000 mg | ORAL_TABLET | Freq: Every day | ORAL | 11 refills | Status: DC

## 2018-07-25 NOTE — Telephone Encounter (Signed)
Patient called to check on the FMLA papers she dropped off for PCP in regardds to her husband. Please follow up.

## 2018-07-25 NOTE — Progress Notes (Signed)
Patient verified DOB Patient has not taken medication today. Patient has not eaten yet today. Patient denies pain at this time.

## 2018-07-25 NOTE — Progress Notes (Signed)
Virtual Visit via Telephone Note  I connected with Tara Bradshaw on 07/25/18 at  920 AM EDT by telephone and verified that I am speaking with the correct person using two identifiers.   I discussed the limitations, risks, security and privacy concerns of performing an evaluation and management service by telephone and the availability of in person appointments. I also discussed with the patient that there may be a patient responsible charge related to this service. The patient expressed understanding and agreed to proceed.   History of Present Illness:  Patient states that she is now almost 7 months postpartum.  Approximately 2 weeks ago she developed a productive cough with "brownish" phlegm associated with shortness of breath and dyspnea on exertion.  Patient states that her cough and shortness of breath gradually progressed until last week when she went to urgent care and was given amoxicillin and Tessalon Perles for possible bronchitis.  Patient also complained of associated orthopnea and PND and states that she could not lay down flat.  No rhinorrhea, no nausea or vomiting, no diarrhea, no abdominal pain, no lightheadedness, no syncope, no focal weakness, no headache, no blurry vision, no palpitations, no diaphoresis.  Patient states that she did not get any relief with the amoxicillin Tessalon Perles and came to the ED today for progressively worsening shortness of breath.  She denies any recent travel or any known sick contacts.   Observations/Objective: Review of Systems  Constitutional: Negative.   HENT: Negative.   Eyes: Negative.   Respiratory: Positive for cough and shortness of breath.        With activity  Cardiovascular: Negative.   Gastrointestinal: Negative.   Genitourinary: Negative.   Musculoskeletal: Positive for back pain.  Skin: Negative.   Neurological: Negative.   Endo/Heme/Allergies: Positive for environmental allergies.  Psychiatric/Behavioral: Negative.      Assessment and Plan:  [name] was seen today for hospitalization follow-up and Establish Care.  Diagnoses and all orders for this visit:  Acute/Chronic systolic congestive heart failure ([place]) We discussed what CHF meant and how the heart functions  Acute systolic HF with EF 40-34% . Endorses shortness of breath with exertion.  Followed by Heart Care with Dr.Bensimhon  Obesity, Class III, BMI 40-49.9 (morbid obesity) ([place]) Discussed importance of weight loss and improving her heart by not having to work as hard.  Seasonal allergies Watery eyes, rhinitis, high pollen send in antihistamine  RVP + for rhinovirus   Encounter to establish care/Hospitalization  New dx of CHF followed by Heart Care with Dr.Bensimhon    Follow Up Instructions:    I discussed the assessment and treatment plan with the patient. The patient was provided an opportunity to ask questions and all were answered. The patient agreed with the plan and demonstrated an understanding of the instructions.   The patient was advised to call back or seek an in-person evaluation if the symptoms worsen or if the condition fails to improve as anticipated.  I provided  45 minutes of non-face-to-face time during this encounter.   Kerin Perna, NP

## 2018-07-29 NOTE — Telephone Encounter (Signed)
MA received paperwork today. MA contacted patient to clarify the request time and other details.

## 2018-07-30 ENCOUNTER — Encounter (HOSPITAL_COMMUNITY): Payer: Self-pay | Admitting: *Deleted

## 2018-07-30 ENCOUNTER — Other Ambulatory Visit: Payer: Self-pay

## 2018-07-30 ENCOUNTER — Telehealth: Payer: Self-pay

## 2018-07-30 ENCOUNTER — Ambulatory Visit (HOSPITAL_COMMUNITY)
Admission: RE | Admit: 2018-07-30 | Discharge: 2018-07-30 | Disposition: A | Discharge status: Home / Self Care | Payer: 59 | Source: Ambulatory Visit | Attending: Internal Medicine | Admitting: Internal Medicine

## 2018-07-30 DIAGNOSIS — I5022 Chronic systolic (congestive) heart failure: Secondary | ICD-10-CM

## 2018-07-30 DIAGNOSIS — I493 Ventricular premature depolarization: Secondary | ICD-10-CM

## 2018-07-30 DIAGNOSIS — R0683 Snoring: Secondary | ICD-10-CM

## 2018-07-30 MED ORDER — DIGOXIN 125 MCG PO TABS: 0.1250 mg | ORAL_TABLET | Freq: Every day | ORAL | 6 refills | Status: DC

## 2018-07-30 MED ORDER — SACUBITRIL-VALSARTAN 24-26 MG PO TABS: 1.0000 | ORAL_TABLET | Freq: Two times a day (BID) | ORAL | 6 refills | Status: DC

## 2018-07-30 MED ORDER — FUROSEMIDE 40 MG PO TABS: 40.0000 mg | ORAL_TABLET | Freq: Every day | ORAL | 6 refills | Status: DC

## 2018-07-30 MED ORDER — AMIODARONE HCL 200 MG PO TABS: 200.0000 mg | ORAL_TABLET | Freq: Every day | ORAL | 6 refills | Status: DC

## 2018-07-30 MED ORDER — SPIRONOLACTONE 25 MG PO TABS: 12.5000 mg | ORAL_TABLET | Freq: Every day | ORAL | 6 refills | Status: DC

## 2018-07-30 NOTE — Progress Notes (Signed)
Spoke w/pt via phone and completed the [place], AVS reviewed with her via phone and sent via mychart.  Message sent to schedulers to sch f/u appts.

## 2018-07-30 NOTE — Progress Notes (Signed)
Heart Failure TeleHealth Note  Due to national recommendations of social distancing due to [place] 19, Audio/video telehealth visit is felt to be most appropriate for this patient at this time.  See MyChart message from today for patient consent regarding telehealth for [facility].  Date:  07/30/2018   ID:  Tara Bradshaw, DOB 17-Aug-1985, MRN [id number]  Location: Home  Provider location: [facility] Type of Visit: Established patient  PCP:  Kerin Perna, NP  Cardiologist:  No primary care provider on file. Primary HF: Bensimhon  Chief Complaint: Heart Failure follow-up   History of Present Illness:  Tara Bradshaw is a 33 y/o woman with h/o mild HTN who was admitted in 3/20 with acute systolic HF with EF 76-16% in setting of recent viral illness and 28-month post-partum status. COVID testing negative.   She presents today for f/u telehealth visit for new-onset systolic HF in setting of COVID pandemic.  We had a post-discharge telehealth visit on 4/10. Was doing well but still with cough. Weight was stable.   Says she is getting better. Getting more active. Having occasionally back pain. Minimal dependent ankle edema. Taking  Lasix 40mg  lasix daily. No need for extra. Cough improved. No orthopnea or PND. No way to check BP. Weight unchanged. No dizziness. Snoring at night. + fatigue.   Tara Bradshaw denies symptoms worrisome for COVID 19.   Past Medical History:  Diagnosis Date  . Anemia    CHRONIC  . GERD (gastroesophageal reflux disease)    WITH PREGNANCY  . Pneumonia    x 1   Past Surgical History:  Procedure Laterality Date  . DILATION AND EVACUATION N/A 11/30/2016   Procedure: DILATATION AND EVACUATION;  Surgeon: Donnamae Jude, MD;  Location: [facility];  Service: Gynecology;  Laterality: N/A;  . WISDOM TOOTH EXTRACTION     NO ANESTHESIA     Current Outpatient Medications  Medication Sig Dispense Refill  . amiodarone  (PACERONE) 200 MG tablet Take 1 tablet (200 mg total) by mouth daily. 30 tablet 2  . cetirizine (ZYRTEC) 10 MG tablet Take 1 tablet (10 mg total) by mouth daily. 30 tablet 11  . digoxin (LANOXIN) 0.125 MG tablet Take 1 tablet (0.125 mg total) by mouth daily. 30 tablet 2  . furosemide (LASIX) 20 MG tablet Take 1 tablet daily. Take an additional tablet daily as needed for greater than 2lbs weight gain in 24 hours 30 tablet 2  . guaiFENesin-codeine 100-10 MG/5ML syrup Take 5 mLs by mouth every 4 (four) hours as needed for cough. (Patient not taking: Reported on 07/25/2018) 120 mL 0  . sacubitril-valsartan (ENTRESTO) 24-26 MG Take 1 tablet by mouth 2 (two) times daily. 60 tablet 2  . spironolactone (ALDACTONE) 25 MG tablet Take 0.5 tablets (12.5 mg total) by mouth daily. 15 tablet 2   No current facility-administered medications for this encounter.     Allergies:   Patient has no known allergies.   Social History:  The patient  reports that she has never smoked. She has never used smokeless tobacco. She reports that she does not drink alcohol or use drugs.   Family History:  The patient's family history includes Asthma in her mother; Cancer in her paternal grandmother; Cancer (age of onset: 77) in her maternal aunt; Epilepsy in her mother; Hypertension in her maternal grandmother.   ROS:  Please see the history of present illness.   All other systems are personally reviewed  and negative.   Exam:  (Video/Tele Health Call; Exam is subjective and or/visual.) General:  Speaks in full sentences. No resp difficulty. Lungs: Normal respiratory effort with conversation.  Abdomen: Non-distended per patient report Extremities: Pt denies edema. Neuro: Alert & oriented x 3.   Recent Labs: 07/03/2018: ALT 62; B Natriuretic Peptide 1,021.4 07/05/2018: BUN 10; Creatinine, Ser 0.82; Hemoglobin 12.6; Magnesium 1.7; Platelets 445; Potassium 3.3; Sodium 136; TSH 0.235  Personally reviewed   Wt Readings from Last  3 Encounters:  07/05/18 111.7 kg (246 lb 3.2 oz)  12/11/17 116.6 kg (257 lb 1.9 oz)  11/29/17 117.5 kg (259 lb)      ASSESSMENT AND PLAN:  1. Acute systolic HF - New onset 0/09. Bedside echoLVEF 25% with moderate RV dysfunction - Almost certainly NICM. Either viral (had URI in week prior to admission and respiratory panel + rhinovirus) or post-partum or PVC cardiomyopathy - Weight stable  - Improved NYHA II - Continue entresto 25/26 bid - would like to titrate to 49/51 bid but need vitals first - Continue digoxin 0.125 - Continue spiro 12.5 - Continue lasix 40mg  daily. Counseled on need to take extra lasix for weight gain or increasing symptoms.  - Will eventually need cMRI - Discussed need to avoid further pregnancies and cease breastfeeding. (she is not planning on having more children and currently has IUD in place) - Will arrange RN home visit to get vitals. - Needs HF meds refilled  2. Snoring - sounds like she has OSA. Will arrange for home sleep study.   3. PVCs - In hospital was averaging about 8-10 PVCs per minute. May be contributing to LV dysfunction - Continue low dose amio 200 daily for suppression   COVID screen The patient does not have any symptoms that suggest any further testing/ screening at this time.  Social distancing reinforced today.  Recommended follow-up:  1 month televisit  Relevant cardiac medications were reviewed at length with the patient today.   The patient does not have concerns regarding their medications at this time.   The following changes were made today:  As above  Today, I have spent 18 minutes with the patient with telehealth technology discussing the above issues .    Signed, Glori Bickers, MD  07/30/2018 12:47 PM  Advanced [facility] Failure [place] [address] [facility] and [place] [address] [phone] (office) [phone] (fax)

## 2018-07-30 NOTE — Progress Notes (Signed)
  Height:  5'1"    Weight: 246 lbs         Referring Provider: Bensimhon Today's Date: 07/30/2018, completed via phone     STOP BANG RISK ASSESSMENT S (snore) Have you been told that you snore?     YES   T (tired) Are you often tired, fatigued, or sleepy during the day?   YES  O (obstruction) Do you stop breathing, choke, or gasp during sleep? NO   P (pressure) Do you have or are you being treated for high blood pressure? YES   B (BMI) Is your body index greater than 35 kg/m? YES   A (age) Are you 35 years old or older? NO   N (neck) Do you have a neck circumference greater than 16 inches?      G (gender) Are you a female? NO   TOTAL STOP/BANG "YES" ANSWERS 4                                                                       For Office Use Only              Procedure Order Form    YES to 3+ Stop Bang questions OR two clinical symptoms - patient qualifies for WatchPAT (CPT 95800)     Submit: This Form + Patient Face Sheet + Clinical Note via CloudPAT or Fax: [phone]         Clinical Notes: Will consult Sleep Specialist and refer for management of therapy due to patient increased risk of Sleep Apnea. Ordering a sleep study due to the following two clinical symptoms: Excessive daytime sleepiness G47.10 /  / Loud snoring R06.83 /

## 2018-07-30 NOTE — Patient Instructions (Signed)
All refills for your cardiac medications [facility] [address] for you  Debbie, RN with [facility] will call you and schedule a time she can come out and check your vital signs  Your provider has recommended that you have a home sleep study.  [employer] is the company that provides these and will send the equipment right to your home with instructions on how to set it up.  Once you have completed the test you just dispose of the equipment, the information is automatically uploaded to [place].  If your test was positive and you need a home CPAP machine you will be contacted by Dr Theodosia Blender office [facility]) to set this up.  Your physician recommends that you return for lab work in: 1 week, our office will call you to schedule  Your physician recommends that you schedule a follow-up appointment in: 1 month with telehealth visit with Dr Haroldine Laws, our office will call you to schedule

## 2018-07-30 NOTE — Telephone Encounter (Signed)
Spoke to Ms. Outman to set up an appointment for a home visit to obtain a ReDS clip reading and vitals as requested by the HF clinic.  Appointment for 10 am tomorrow 07/31/18.

## 2018-07-30 NOTE — Addendum Note (Signed)
Encounter addended by: Scarlette Calico, RN on: 07/30/2018 2:40 PM  Actions taken: Pharmacy for encounter modified, Order list changed, Diagnosis association updated, Clinical Note Signed

## 2018-07-31 ENCOUNTER — Other Ambulatory Visit (HOSPITAL_COMMUNITY): Payer: Self-pay

## 2018-07-31 ENCOUNTER — Telehealth (HOSPITAL_COMMUNITY): Payer: Self-pay | Admitting: Cardiology

## 2018-07-31 ENCOUNTER — Telehealth (HOSPITAL_COMMUNITY): Payer: Self-pay | Admitting: Licensed Clinical Social Worker

## 2018-07-31 DIAGNOSIS — I5022 Chronic systolic (congestive) heart failure: Secondary | ICD-10-CM

## 2018-07-31 MED ORDER — POTASSIUM CHLORIDE CRYS ER 20 MEQ PO TBCR: 20.0000 meq | EXTENDED_RELEASE_TABLET | Freq: Every day | ORAL | 0 refills | Status: DC

## 2018-07-31 MED ORDER — DIGOXIN 125 MCG PO TABS: 0.1250 mg | ORAL_TABLET | Freq: Every day | ORAL | 0 refills | Status: DC

## 2018-07-31 MED ORDER — FUROSEMIDE 40 MG PO TABS: 40.0000 mg | ORAL_TABLET | Freq: Every day | ORAL | 0 refills | Status: DC

## 2018-07-31 MED ORDER — SACUBITRIL-VALSARTAN 49-51 MG PO TABS: 1.0000 | ORAL_TABLET | Freq: Two times a day (BID) | ORAL | 0 refills | Status: DC

## 2018-07-31 MED ORDER — SPIRONOLACTONE 25 MG PO TABS: 12.5000 mg | ORAL_TABLET | Freq: Every day | ORAL | 0 refills | Status: DC

## 2018-07-31 MED ORDER — AMIODARONE HCL 200 MG PO TABS: 200.0000 mg | ORAL_TABLET | Freq: Every day | ORAL | 0 refills | Status: DC

## 2018-07-31 NOTE — Progress Notes (Signed)
Tara Bradshaw      DOB: [birth year]  Purpose of Visit: ReDS clip reading, Vital signs  HF provider:  Medications: Is the patient taking all medications listed on MAR from Epic? Yes   List any medications that are not being taken correctly:  List any medication refills needed: [facility]  Refill for spironolactone and enteresto are [facility]approved [facility]. Digoxin and Amiodarone are awaiting approval.  Reported Caryl Pina, NP at HF clinic who will talk to HF SW.   Is the patient able to pick up medications? Yes  Vitals: BP: 134/62   HR: 68  Oxygen: 98% RA Weight: 243lb        Physical Exam: Circle, add more if applicable.  Lung sounds: clear  Heart sounds: regular  Peripheral edema: no  Wounds: no  Location:  Any patient concerns? Intermittent headaches and mid back aching that she's reported to [place] Vest/Clip Reading: 44% and 43% on recheck.    Rhythm Strip:n/a  Is Home Health recommended? No If yes, state reason:   Results reported to Caryl Pina, NP at HF clinic and new orders given: Pt to take Enteresto 24mg /26mg  x 2 tabs BID, lasix 40mg  BID x 2 days then  Back to QD, New Rx for KCl 21meq QD, and she will get a call for an appt for lab draw w/i a week.  Reviewed w/ Tara Bradshaw who wrote orders down and read back correctly.  She knows to call the HF clinic w/any concerns.   Tara Ingles, RN 07/31/18

## 2018-07-31 NOTE — Telephone Encounter (Signed)
Received call from Vanita Ingles, RN, who went to see Ms Linenberger today for vitals and ReDS reading.   ReDS reading 44%. BP 132/62, HR 68, O2 98% on RA. Lungs clear and no edema per nursing assessment.   Advised for patient to take lasix 40 mg BID x 2 days, then back to lasix 40 mg daily. Her potassium has been low on most recent lab checks (3.3 on 3/28), so will start potassium 20 meq daily. Increase Entresto to 49/51 mg BID. Check BMET next week in HF clinic. Will send HF refills to [facility]. She got a call from her pharmacy that her insurance would not cover Delene Loll or Arlyce Harman refills. I'll have HF CSW check in on this.  Georgiana Shore, NP

## 2018-07-31 NOTE — Telephone Encounter (Signed)
CSW consulted to help regarding medication concerns.  CSW called pharmacy who confirms that pt has no active insurance on file.  CSW called pt who reports she had [employer] through her job that she was laid off recently due to the [place] virus- she didn't realize that her insurance was also being terminated.  Pt reports she received something in the mail today stating that her Medicaid is being extended due to current concerns with the virus- she has had pregnant medicaid and family planning medicaid recently so she is unsure which of these is being extended- Pt to call medicaid and speak with her case worker to see if she still has full medicaid coverage at this time which would cover her medications.  CSW also emailed patient another Novart[employer] application to fill out if she doesn't have insurance as that would be the only way to obtain it.  CSW will continue to follow and assist as needed  Jorge Ny, [facility] Desk#: [phone] Cell#: [phone]

## 2018-08-01 ENCOUNTER — Telehealth (HOSPITAL_COMMUNITY): Payer: Self-pay | Admitting: Licensed Clinical Social Worker

## 2018-08-01 ENCOUNTER — Other Ambulatory Visit (HOSPITAL_COMMUNITY): Payer: Self-pay

## 2018-08-01 ENCOUNTER — Telehealth (HOSPITAL_COMMUNITY): Payer: 59 | Admitting: Internal Medicine

## 2018-08-01 MED ORDER — POTASSIUM CHLORIDE CRYS ER 20 MEQ PO TBCR: 20.0000 meq | EXTENDED_RELEASE_TABLET | Freq: Every day | ORAL | 0 refills | Status: DC

## 2018-08-01 MED ORDER — DIGOXIN 125 MCG PO TABS: 0.1250 mg | ORAL_TABLET | Freq: Every day | ORAL | 0 refills | Status: DC

## 2018-08-01 MED ORDER — AMIODARONE HCL 200 MG PO TABS: 200.0000 mg | ORAL_TABLET | Freq: Every day | ORAL | 0 refills | Status: DC

## 2018-08-01 MED ORDER — FUROSEMIDE 40 MG PO TABS: 40.0000 mg | ORAL_TABLET | Freq: Every day | ORAL | 0 refills | Status: DC

## 2018-08-01 MED ORDER — SPIRONOLACTONE 25 MG PO TABS: 12.5000 mg | ORAL_TABLET | Freq: Every day | ORAL | 0 refills | Status: DC

## 2018-08-01 MED FILL — AMIODARONE HCL 200 MG TABS: 200 | 30 days supply | Qty: 30 | Fill #0

## 2018-08-01 MED FILL — POTASSIUM CHLORIDE CRYS ER: 20 | 30 days supply | Qty: 30 | Fill #0

## 2018-08-01 MED FILL — DIGOXIN 0.125 MG TABLET: 125 | 30 days supply | Qty: 30 | Fill #0

## 2018-08-01 MED FILL — FUROSEMIDE 40 MG TAB: 40 | 30 days supply | Qty: 30 | Fill #0

## 2018-08-01 MED FILL — SPIRONOLACTONE 25 MG TABS: 25 | 30 days supply | Qty: 15 | Fill #0

## 2018-08-01 NOTE — Telephone Encounter (Signed)
1 bottle of samples given of Entresto 49/51. Lot # [id number]. Expiration # 09/2020.   Refilled all cardiac medications to [facility] through the heart failure fund.   Pt to pick samples up

## 2018-08-01 NOTE — Telephone Encounter (Signed)
CSW spoke with pt again today regarding insurance concerns.  Pt has discovered that she only has Medicaid Family Planning which does NOT cover medication costs.  Pt currently with no insurance.  CSW called clinic and had RN send in prescriptions for her heart failure medications to the [facility] to be filled under the [place].    Pt is close to running out of Delene Loll- is going to complete Nov[employer] application today and plan to scan and return to CSW.  Clinic to provide pt with samples to help hold her over until application can be processed.  CSW will continue to follow and assist as needed  Jorge Ny, [facility] Desk#: [phone] Cell#: [phone]

## 2018-08-03 ENCOUNTER — Encounter (INDEPENDENT_AMBULATORY_CARE_PROVIDER_SITE_OTHER): Payer: 59 | Admitting: Cardiology

## 2018-08-03 DIAGNOSIS — G4733 Obstructive sleep apnea (adult) (pediatric): Secondary | ICD-10-CM | POA: Diagnosis not present

## 2018-08-07 ENCOUNTER — Other Ambulatory Visit (HOSPITAL_COMMUNITY): Payer: 59

## 2018-08-07 ENCOUNTER — Encounter (HOSPITAL_COMMUNITY): Payer: 59

## 2018-08-27 ENCOUNTER — Ambulatory Visit: Payer: Medicaid Other

## 2018-09-02 ENCOUNTER — Ambulatory Visit (HOSPITAL_COMMUNITY)
Admission: RE | Admit: 2018-09-02 | Discharge: 2018-09-02 | Disposition: A | Discharge status: Home / Self Care | Payer: 59 | Source: Ambulatory Visit | Attending: Internal Medicine | Admitting: Internal Medicine

## 2018-09-02 ENCOUNTER — Other Ambulatory Visit: Payer: Self-pay

## 2018-09-02 DIAGNOSIS — I5022 Chronic systolic (congestive) heart failure: Secondary | ICD-10-CM

## 2018-09-02 DIAGNOSIS — G4733 Obstructive sleep apnea (adult) (pediatric): Secondary | ICD-10-CM

## 2018-09-02 MED ORDER — FUROSEMIDE 40 MG PO TABS: 40.0000 mg | ORAL_TABLET | Freq: Two times a day (BID) | ORAL | 6 refills | Status: DC

## 2018-09-02 NOTE — Progress Notes (Signed)
Yes, sorry Tara Bradshaw we have been having major issues with the sleep encounter showing up to place orders.  I will just send a secure message to Gae Bon to go ahead and get set up.  We have things fixed with encounters on patients going forward - it is just the first 5 that IT has messed up.  Gae Bon - please send in order for ResMed CPAP with heated humidity and maks of choice with auto settings from 5 to 20cm H2O.  Please get a download in 2 weeks and set up F/U with me in 10 weeks from time she gets her device.

## 2018-09-02 NOTE — Patient Instructions (Signed)
Increase Furosemide to 40 mg Twice daily   Labs in 1 week:  Tuesday 6/2 at 10:30 AM  Your physician recommends that you schedule a follow-up appointment in: 1 month with telephone appointment with Dr Haroldine Laws:  Wed 6/24 at 11:20 AM  Your physician recommends that you schedule a follow-up appointment in: 2 months with echocardiogram.  Our office will call you to schedule this appointment.

## 2018-09-02 NOTE — Progress Notes (Signed)
Spoke w/pt via phone.  She now has full medicaid and will let us know her ID number so that I can complete PA for [facility], she is currently out of Entresto but will come by office and get samples.  [facility], labs sch for 6/2, telephone visit w/Dr Bensimhon sch for 6/24, message sent to schedulers to sch 2 month f/u w/echo.  Medication Samples have been provided to the patient.  Drug name: Delene Loll       Strength: 49/51        Qty: 3  LOT: [id number]  Exp.Date: 3/22  Dosing instructions: take 1 tab Twice daily   The patient has been instructed regarding the correct time, dose, and frequency of taking this medication, including desired effects and most common side effects.   Ric Rosenberg 4:39 PM 09/02/2018

## 2018-09-02 NOTE — Addendum Note (Signed)
Encounter addended by: Scarlette Calico, RN on: 09/02/2018 4:46 PM  Actions taken: Pharmacy for encounter modified, Order list changed, Diagnosis association updated, Clinical Note Signed

## 2018-09-02 NOTE — Progress Notes (Signed)
Heart Failure TeleHealth Note  Due to national recommendations of social distancing due to [place] 19, Audio/video telehealth visit is felt to be most appropriate for this patient at this time.  See MyChart message from today for patient consent regarding telehealth for [facility].  Date:  09/02/2018   ID:  Tara Bradshaw, DOB July 29, 1985, MRN [id number]  Location: Home  Provider location: [facility] Type of Visit: Established patient  PCP:  Kerin Perna, NP  Cardiologist:  No primary care provider on file. Primary HF: Callan Norden  Chief Complaint: Heart Failure follow-up   History of Present Illness:  Tara Bradshaw is a 33 y/o woman with h/o mild HTN who was admitted in 3/20 with acute systolic HF with EF 86-76% in setting of recent viral illness and 45-month post-partum status. COVID testing negative.   She presents today for f/u telehealth visit for new-onset systolic HF in setting of COVID pandemic.  We had a post-discharge telehealth visit on 4/10. Was doing well but still with cough. Weight was stable.    Had Vanita Ingles, RN make a nurse visit on 4/23 and ReDS was 44%. Lasix increased to 40 bid for 2 days then back down to 40 daily. Had to apply for [facility] assistance program to get more refills. BP was 132/62 at that visit.   Says she feels good. However felt better after losing 5-7 pounds with the extra lasix. Now that weight is back. Mild DOE with going up steps. No edema, orthopnea or PND. Got approved for Medicaid but still needs PA for [facility].   Tara Bradshaw denies symptoms worrisome for COVID 19.   Past Medical History:  Diagnosis Date  . Anemia    CHRONIC  . GERD (gastroesophageal reflux disease)    WITH PREGNANCY  . Pneumonia    x 1   Past Surgical History:  Procedure Laterality Date  . DILATION AND EVACUATION N/A 11/30/2016   Procedure: DILATATION AND EVACUATION;  Surgeon: Donnamae Jude, MD;  Location: [facility];   Service: Gynecology;  Laterality: N/A;  . WISDOM TOOTH EXTRACTION     NO ANESTHESIA     Current Outpatient Medications  Medication Sig Dispense Refill  . amiodarone (PACERONE) 200 MG tablet Take 1 tablet (200 mg total) by mouth daily. 90 tablet 0  . cetirizine (ZYRTEC) 10 MG tablet Take 1 tablet (10 mg total) by mouth daily. 30 tablet 11  . digoxin (LANOXIN) 0.125 MG tablet Take 1 tablet (0.125 mg total) by mouth daily. 90 tablet 0  . furosemide (LASIX) 40 MG tablet Take 1 tablet (40 mg total) by mouth daily. 90 tablet 0  . guaiFENesin-codeine 100-10 MG/5ML syrup Take 5 mLs by mouth every 4 (four) hours as needed for cough. (Patient not taking: Reported on 07/25/2018) 120 mL 0  . potassium chloride SA (K-DUR) 20 MEQ tablet Take 1 tablet (20 mEq total) by mouth daily. 90 tablet 0  . sacubitril-valsartan (ENTRESTO) 49-51 MG Take 1 tablet by mouth 2 (two) times daily. 180 tablet 0  . spironolactone (ALDACTONE) 25 MG tablet Take 0.5 tablets (12.5 mg total) by mouth daily. 45 tablet 0   No current facility-administered medications for this encounter.     Allergies:   Patient has no known allergies.   Social History:  The patient  reports that she has never smoked. She has never used smokeless tobacco. She reports that she does not drink alcohol or use drugs.   Family  History:  The patient's family history includes Asthma in her mother; Cancer in her paternal grandmother; Cancer (age of onset: 39) in her maternal aunt; Epilepsy in her mother; Hypertension in her maternal grandmother.   ROS:  Please see the history of present illness.   All other systems are personally reviewed and negative.   Exam:  (Video/Tele Health Call; Exam is subjective and or/visual.) General:  Speaks in full sentences. No resp difficulty. Lungs: Normal respiratory effort with conversation.  Abdomen: Non-distended per patient report Extremities: Pt denies edema. Neuro: Alert & oriented x 3.   Recent Labs:  07/03/2018: ALT 62; B Natriuretic Peptide 1,021.4 07/05/2018: BUN 10; Creatinine, Ser 0.82; Hemoglobin 12.6; Magnesium 1.7; Platelets 445; Potassium 3.3; Sodium 136; TSH 0.235  Personally reviewed   Wt Readings from Last 3 Encounters:  07/31/18 110.2 kg (243 lb)  07/05/18 111.7 kg (246 lb 3.2 oz)  12/11/17 116.6 kg (257 lb 1.9 oz)      ASSESSMENT AND PLAN:  1. Acute systolic HF - New onset 7/48. Bedside echoLVEF 25% with moderate RV dysfunction - Almost certainly NICM. Either viral (had URI in week prior to admission and respiratory panel + rhinovirus) or post-partum or PVC cardiomyopathy - Weight stable  - Stable NYHA II - At home visit in April ReDS was up to 44% and had lasix doubled for 2 days - now eight back up.  - Increase lasix back to 40 bid - Increase entresto to 49/51 - will need PA filled out.  - Watch for volume depletion. Check labs next week - Continue digoxin 0.125 - Continue spiro 12.5 - Will eventually need cMRI - Discussed need to avoid further pregnancies and cease breastfeeding. (she is not planning on having more children and currently has IUD in place)  2. Snoring - Had WatchPat study 07/30/18 has moderate OSA (AHI 15.3). I reached out to Dr. Radford Pax to discuss   3. PVCs - In hospital was averaging about 8-10 PVCs per minute. May be contributing to LV dysfunction - Continue low dose amio 200 daily for suppression   COVID screen The patient does not have any symptoms that suggest any further testing/ screening at this time.  Social distancing reinforced today.  Recommended follow-up:  1 month televisit. 2 month with echo   Relevant cardiac medications were reviewed at length with the patient today.   The patient does not have concerns regarding their medications at this time.   The following changes were made today:  As above  Today, I have spent 16 minutes with the patient with telehealth technology discussing the above issues .    Signed, Glori Bickers, MD  09/02/2018 1:13 PM  [facility] [place] [address] Heart and [place] [address] [phone] (office) [phone] (fax)

## 2018-09-04 ENCOUNTER — Telehealth: Payer: Self-pay | Admitting: *Deleted

## 2018-09-04 ENCOUNTER — Telehealth (HOSPITAL_COMMUNITY): Payer: Self-pay

## 2018-09-04 DIAGNOSIS — G4733 Obstructive sleep apnea (adult) (pediatric): Secondary | ICD-10-CM

## 2018-09-04 NOTE — Telephone Encounter (Addendum)
-----   Message from Sueanne Margarita, MD sent at 09/02/2018  2:48 PM EDT -----   ----- Message ----- From: Jolaine Artist, MD Sent: 09/02/2018   1:25 PM EDT To: Sueanne Margarita, MD  Eyvonne Mechanic - She has moderate OSA. Can we contact [facility] to help her get CPAP? Thanks -dan   Gae Bon - please send in order for ResMed CPAP with heated humidity and maks of choice with auto settings from 5 to 20cm H2O.  Please get a download in 2 weeks and set up F/U with me in 10 weeks from time she gets her device.

## 2018-09-04 NOTE — Telephone Encounter (Signed)
Called pt for Medicare ID # that is needed for [facility], pt stated Pharm needed V/O from this office to fill script and did not have ID # (no card yet), called said Pharm and rep gave ID number [id number] L for Medicare. Will complete PA.

## 2018-09-04 NOTE — Telephone Encounter (Addendum)
Order placed to [facility] Informed patient of sleep study results. Left detailed message on voicemail and informed patient to call back to confirm she received and understood message.  Informed patient of sleep study results and patient understanding was verbalized. Patient understands she has been ordered auto PAP through [facility] and needs followup with me in 10 weeks after getting her device.  Patient  DME is [facility] Patient understands she will be contacted by [facility] to set up her cpap. Patient understands to call if [facility] does not contact her with new setup in a timely manner. Patient understands they will be called once confirmation has been received from [facility] that they have received their new machine to schedule 10 week follow up appointment.  BN notified of new cpap order  Please add to airview Patient was grateful for the call and thanked me.

## 2018-09-09 ENCOUNTER — Other Ambulatory Visit: Payer: Self-pay

## 2018-09-09 ENCOUNTER — Ambulatory Visit (HOSPITAL_COMMUNITY)
Admission: RE | Admit: 2018-09-09 | Discharge: 2018-09-09 | Disposition: A | Discharge status: Home / Self Care | Payer: Medicaid Other | Source: Ambulatory Visit | Attending: Cardiology | Admitting: Cardiology

## 2018-09-09 DIAGNOSIS — I5022 Chronic systolic (congestive) heart failure: Secondary | ICD-10-CM

## 2018-09-09 LAB — BASIC METABOLIC PANEL
Anion gap: 11 (ref 5–15)
BUN: 7 mg/dL (ref 6–20)
CO2: 20 mmol/L — ABNORMAL LOW (ref 22–32)
Calcium: 9.2 mg/dL (ref 8.9–10.3)
Chloride: 106 mmol/L (ref 98–111)
Creatinine, Ser: 0.85 mg/dL (ref 0.44–1.00)
GFR calc Af Amer: >60 mL/min (ref >60)
GFR calc non Af Amer: >60 mL/min (ref >60)
Glucose, Bld: 123 mg/dL — ABNORMAL HIGH (ref 70–99)
Potassium: 3.5 mmol/L (ref 3.5–5.1)
Sodium: 137 mmol/L (ref 135–145)

## 2018-09-09 LAB — BRAIN NATRIURETIC PEPTIDE: B Natriuretic Peptide: 95.1 pg/mL (ref 0.0–100.0)

## 2018-09-25 ENCOUNTER — Telehealth (HOSPITAL_COMMUNITY): Payer: Self-pay

## 2018-09-25 NOTE — Telephone Encounter (Signed)
Received fax from [facility] stating that they have been attempting to contact patient and unsuccessful. Spoke with patient and she states she spoke to them 2 weeks ago and havent heard from them since. Gave patient number to [facility].  Pt states she will call them.

## 2018-10-01 ENCOUNTER — Ambulatory Visit (HOSPITAL_COMMUNITY)
Admission: RE | Admit: 2018-10-01 | Discharge: 2018-10-01 | Disposition: A | Discharge status: Home / Self Care | Payer: 59 | Source: Ambulatory Visit | Attending: Internal Medicine | Admitting: Internal Medicine

## 2018-10-01 ENCOUNTER — Other Ambulatory Visit: Payer: Self-pay

## 2018-10-01 DIAGNOSIS — I5022 Chronic systolic (congestive) heart failure: Secondary | ICD-10-CM | POA: Diagnosis not present

## 2018-10-01 DIAGNOSIS — G4733 Obstructive sleep apnea (adult) (pediatric): Secondary | ICD-10-CM

## 2018-10-01 DIAGNOSIS — I493 Ventricular premature depolarization: Secondary | ICD-10-CM

## 2018-10-01 NOTE — Progress Notes (Signed)
Heart Failure TeleHealth Note  Due to national recommendations of social distancing due to [place] 19, Audio/video telehealth visit is felt to be most appropriate for this patient at this time.  See MyChart message from today for patient consent regarding telehealth for Tara Bradshaw.  Date:  10/01/2018   ID:  Tara Bradshaw, DOB 02/13/1986, MRN [id number]  Location: Home  Provider location: [facility] Type of Visit: Established patient  PCP:  Kerin Perna, NP  Cardiologist:  No primary care provider on file. Primary HF: Bensimhon  Chief Complaint: Heart Failure follow-up   History of Present Illness:  Tara Bradshaw is a 34 y/o woman with h/o mild HTN who was admitted in 3/20 with acute systolic HF with EF 54-62% in setting of recent viral illness and 80-month post-partum status. COVID testing negative.   She presents today for f/u telehealth visit for new-onset systolic HF in setting of COVID pandemic.  We had a post-discharge telehealth visit on 4/10. Was doing well but still with cough. Weight was stable.    Had Tara Ingles, RN make a nurse visit on 4/23 and ReDS was 44%. Lasix increased to 40 bid for 2 days then back down to 40 daily. Had to apply for [facility] assistance program to get more refills. BP was 132/62 at that visit.   She presents today for a telehealh visit due to [place] pandemic. Says she is having good days and bad days. Denies ankle edema. But sometimes feels aching in chest and back that felt like her HF. Gets better when she takes her meds. Weight up and down. Now at baseline. No orthopnea or PND. Does get SOB when active. About the same. Will take BP later today. Hasn't taken in a while    Tara Bradshaw denies symptoms worrisome for COVID 19.   Past Medical History:  Diagnosis Date  . Anemia    CHRONIC  . GERD (gastroesophageal reflux disease)    WITH PREGNANCY  . Pneumonia    x 1   Past Surgical History:   Procedure Laterality Date  . DILATION AND EVACUATION N/A 11/30/2016   Procedure: DILATATION AND EVACUATION;  Surgeon: Donnamae Jude, MD;  Location: [facility];  Service: Gynecology;  Laterality: N/A;  . WISDOM TOOTH EXTRACTION     NO ANESTHESIA     Current Outpatient Medications  Medication Sig Dispense Refill  . amiodarone (PACERONE) 200 MG tablet Take 1 tablet (200 mg total) by mouth daily. 90 tablet 0  . cetirizine (ZYRTEC) 10 MG tablet Take 1 tablet (10 mg total) by mouth daily. 30 tablet 11  . digoxin (LANOXIN) 0.125 MG tablet Take 1 tablet (0.125 mg total) by mouth daily. 90 tablet 0  . furosemide (LASIX) 40 MG tablet Take 1 tablet (40 mg total) by mouth 2 (two) times daily. 60 tablet 6  . guaiFENesin-codeine 100-10 MG/5ML syrup Take 5 mLs by mouth every 4 (four) hours as needed for cough. (Patient not taking: Reported on 07/25/2018) 120 mL 0  . potassium chloride SA (K-DUR) 20 MEQ tablet Take 1 tablet (20 mEq total) by mouth daily. 90 tablet 0  . sacubitril-valsartan (ENTRESTO) 49-51 MG Take 1 tablet by mouth 2 (two) times daily. 180 tablet 0  . spironolactone (ALDACTONE) 25 MG tablet Take 0.5 tablets (12.5 mg total) by mouth daily. 45 tablet 0   No current facility-administered medications for this encounter.     Allergies:   Patient has no known  allergies.   Social History:  The patient  reports that she has never smoked. She has never used smokeless tobacco. She reports that she does not drink alcohol or use drugs.   Family History:  The patient's family history includes Asthma in her mother; Cancer in her paternal grandmother; Cancer (age of onset: 48) in her maternal aunt; Epilepsy in her mother; Hypertension in her maternal grandmother.   ROS:  Please see the history of present illness.   All other systems are personally reviewed and negative.   Exam:  (Video/Tele Health Call; Exam is subjective and or/visual.) General:  Speaks in full sentences. No resp difficulty. Lungs:  Normal respiratory effort with conversation.  Abdomen: Non-distended per patient report Extremities: Pt denies edema. Neuro: Alert & oriented x 3.   Recent Labs: 07/03/2018: ALT 62 07/05/2018: Hemoglobin 12.6; Magnesium 1.7; Platelets 445; TSH 0.235 09/09/2018: B Natriuretic Peptide 95.1; BUN 7; Creatinine, Ser 0.85; Potassium 3.5; Sodium 137  Personally reviewed   Wt Readings from Last 3 Encounters:  07/31/18 110.2 kg (243 lb)  07/05/18 111.7 kg (246 lb 3.2 oz)  12/11/17 116.6 kg (257 lb 1.9 oz)      ASSESSMENT AND PLAN:  1. Acute systolic HF - New onset 2/69. Bedside echoLVEF 25% with moderate RV dysfunction - Almost certainly NICM. Either viral (had URI in week prior to admission and respiratory panel + rhinovirus) or post-partum or PVC cardiomyopathy - Weight stable  - Stable NYHA II - At home visit in April ReDS was up to 44% and had lasix doubled for 2 days - now eight back up.  - Continue lasix 40 bid - Continue entresto to 49/51 - BMET 6/2 K 3.5 Cr 0.85. BNP 95 - Watch for volume depletion. Check labs next week - Continue digoxin 0.125 - Continue spiro 12.5 - Will eventually need cMRI - Discussed need to avoid further pregnancies and cease breastfeeding. (she is not planning on having more children and currently has IUD in place) - Will need echo in a few weeks  - Visit in 2 months  2. Snoring - Had WatchPat study 07/30/18 has moderate OSA (AHI 15.3). - Still waiting to hear back  3. PVCs - In [place] was averaging about 8-10 PVCs per minute. May be contributing to LV dysfunction - Continue low dose amio 200 daily for suppression   COVID screen The patient does not have any symptoms that suggest any further testing/ screening at this time.  Social distancing reinforced today.  Recommended follow-up:  1 month televisit. 2 month with echo   Relevant cardiac medications were reviewed at length with the patient today.   The patient does not have concerns  regarding their medications at this time.   The following changes were made today:  As above  Today, I have spent 14 minutes with the patient with telehealth technology discussing the above issues .    Signed, Glori Bickers, MD  10/01/2018 4:05 PM  [facility] [place] [address] Heart and [place] [address] [phone] (office) [phone] (fax)

## 2018-10-02 NOTE — Procedures (Signed)
Refer to sleep study in chart review under media

## 2018-10-08 NOTE — Progress Notes (Signed)
cpap was ordered by Dr Radford Pax on 6/29, pt aware.  Order for echo placed and message sent to schedulers to arrange.

## 2018-10-08 NOTE — Addendum Note (Signed)
Encounter addended by: Scarlette Calico, RN on: 10/08/2018 4:03 PM  Actions taken: Clinical Note Signed

## 2018-10-09 ENCOUNTER — Telehealth (HOSPITAL_COMMUNITY): Payer: Self-pay | Admitting: Vascular Surgery

## 2018-10-09 NOTE — Telephone Encounter (Signed)
Left pt message giving f/u appt w/ db , asked pt to call back to confirm appts

## 2018-11-04 DIAGNOSIS — G4733 Obstructive sleep apnea (adult) (pediatric): Secondary | ICD-10-CM | POA: Diagnosis not present

## 2018-11-14 ENCOUNTER — Other Ambulatory Visit: Payer: Self-pay

## 2018-11-14 ENCOUNTER — Telehealth (HOSPITAL_COMMUNITY): Payer: Self-pay | Admitting: Licensed Clinical Social Worker

## 2018-11-14 ENCOUNTER — Ambulatory Visit (HOSPITAL_COMMUNITY)
Admission: RE | Admit: 2018-11-14 | Discharge: 2018-11-14 | Disposition: A | Discharge status: Home / Self Care | Payer: Medicaid Other | Source: Ambulatory Visit | Attending: Internal Medicine | Admitting: Internal Medicine

## 2018-11-14 VITALS — BP 127/74 | HR 66 | Wt 249.0 lb

## 2018-11-14 DIAGNOSIS — I5022 Chronic systolic (congestive) heart failure: Secondary | ICD-10-CM | POA: Diagnosis not present

## 2018-11-14 DIAGNOSIS — R0683 Snoring: Secondary | ICD-10-CM | POA: Diagnosis not present

## 2018-11-14 DIAGNOSIS — G4733 Obstructive sleep apnea (adult) (pediatric): Secondary | ICD-10-CM | POA: Diagnosis not present

## 2018-11-14 DIAGNOSIS — D649 Anemia, unspecified: Secondary | ICD-10-CM | POA: Insufficient documentation

## 2018-11-14 DIAGNOSIS — I428 Other cardiomyopathies: Secondary | ICD-10-CM | POA: Insufficient documentation

## 2018-11-14 DIAGNOSIS — R002 Palpitations: Secondary | ICD-10-CM | POA: Diagnosis not present

## 2018-11-14 DIAGNOSIS — Z809 Family history of malignant neoplasm, unspecified: Secondary | ICD-10-CM | POA: Insufficient documentation

## 2018-11-14 DIAGNOSIS — I509 Heart failure, unspecified: Secondary | ICD-10-CM | POA: Diagnosis not present

## 2018-11-14 DIAGNOSIS — I493 Ventricular premature depolarization: Secondary | ICD-10-CM | POA: Insufficient documentation

## 2018-11-14 DIAGNOSIS — Z8249 Family history of ischemic heart disease and other diseases of the circulatory system: Secondary | ICD-10-CM | POA: Insufficient documentation

## 2018-11-14 DIAGNOSIS — I110 Hypertensive heart disease with heart failure: Secondary | ICD-10-CM | POA: Diagnosis not present

## 2018-11-14 DIAGNOSIS — I5023 Acute on chronic systolic (congestive) heart failure: Secondary | ICD-10-CM | POA: Insufficient documentation

## 2018-11-14 DIAGNOSIS — Z79899 Other long term (current) drug therapy: Secondary | ICD-10-CM | POA: Insufficient documentation

## 2018-11-14 LAB — MAGNESIUM: Magnesium: 2 mg/dL (ref 1.7–2.4)

## 2018-11-14 LAB — COMPREHENSIVE METABOLIC PANEL
ALT: 13 U/L (ref 0–44)
AST: 15 U/L (ref 15–41)
Albumin: 4 g/dL (ref 3.5–5.0)
Alkaline Phosphatase: 53 U/L (ref 38–126)
Anion gap: 8 (ref 5–15)
BUN: 5 mg/dL — ABNORMAL LOW (ref 6–20)
CO2: 22 mmol/L (ref 22–32)
Calcium: 9.2 mg/dL (ref 8.9–10.3)
Chloride: 107 mmol/L (ref 98–111)
Creatinine, Ser: 0.59 mg/dL (ref 0.44–1.00)
GFR calc Af Amer: >60 mL/min (ref >60)
GFR calc non Af Amer: >60 mL/min (ref >60)
Glucose, Bld: 95 mg/dL (ref 70–99)
Potassium: 4 mmol/L (ref 3.5–5.1)
Sodium: 137 mmol/L (ref 135–145)
Total Bilirubin: 0.4 mg/dL (ref 0.3–1.2)
Total Protein: 7 g/dL (ref 6.5–8.1)

## 2018-11-14 LAB — CBC
HCT: 37.3 % (ref 36.0–46.0)
Hemoglobin: 11.7 g/dL — ABNORMAL LOW (ref 12.0–15.0)
MCH: 26.1 pg (ref 26.0–34.0)
MCHC: 31.4 g/dL (ref 30.0–36.0)
MCV: 83.3 fL (ref 80.0–100.0)
Platelets: 256 10*3/uL (ref 150–400)
RBC: 4.48 MIL/uL (ref 3.87–5.11)
RDW: 15.4 % (ref 11.5–15.5)
WBC: 6.5 10*3/uL (ref 4.0–10.5)
nRBC: 0 % (ref 0.0–0.2)

## 2018-11-14 LAB — BRAIN NATRIURETIC PEPTIDE: B Natriuretic Peptide: 247.7 pg/mL — ABNORMAL HIGH (ref 0.0–100.0)

## 2018-11-14 MED ORDER — CARVEDILOL 3.125 MG PO TABS: 3.1250 mg | ORAL_TABLET | Freq: Two times a day (BID) | ORAL | 3 refills | Status: DC

## 2018-11-14 NOTE — Patient Instructions (Signed)
Lab work done today. We will notify you of any abnormal lab work. No news is good news.  EKG done today.  ReDS vest clip done today.  START Carvedilol 3.125mg  tab two times daily  Your provider has recommended that  you wear a Zio Patch for 14 days.  This monitor will record your heart rhythm for our review.  IF you have any symptoms while wearing the monitor please press the button.  If you have any issues with the patch or you notice a red or orange light on it please call the company at [phone].  Once you remove the patch please mail it back to the company as soon as possible so we can get the results.  Please follow up with our heart failure pharmacist in 1 week.  Please follow up with the [facility] in 6 weeks.  At the [facility], you and your health needs are our priority. As part of our continuing mission to provide you with exceptional heart care, we have created designated Provider Care Teams. These Care Teams include your primary Cardiologist (physician) and Advanced Practice Providers (APPs- Physician Assistants and Nurse Practitioners) who all work together to provide you with the care you need, when you need it.   You may see any of the following providers on your designated Care Team at your next follow up: Marland Kitchen Dr Glori Bickers . Dr Loralie Champagne . Darrick Grinder, NP   Please be sure to bring in all your medications bottles to every appointment.

## 2018-11-14 NOTE — Telephone Encounter (Signed)
CSW consulted to speak with pt regarding disability application process.    Patient reports she has been out of work since February- was originally put on leave because there wasn't work for her at that time but has since had increased medical concerns and is not allowed to return to work due to her condition (works as a Presenter, broadcasting).  CSW explained disability process and provided pt with number to the local office to set up appt to apply.  Also emailed patient brochure with disability application information.  CSW will continue to follow and assist as needed  Jorge Ny, [facility] Desk#: [phone] Cell#: [phone]

## 2018-11-14 NOTE — Progress Notes (Signed)
Advanced Heart Failure Clinic Note    Date:  11/14/2018   ID:  Tara Bradshaw, DOB 1986/02/27, MRN [id number]  Location: Home  Provider location: [facility] Type of Visit: Established patient  PCP:  Kerin Perna, NP  Cardiologist:  No primary care provider on file. Primary HF: Azaylea Maves  Chief Complaint: Heart Failure follow-up   History of Present Illness:  Tara Bradshaw is a 33 y/o woman with h/o mild HTN who was admitted in 3/20 with acute systolic HF with EF 96-78% in setting of recent viral illness and 45-month post-partum status. COVID testing negative. Did not undergo catha s felt to be NICM   Had Vanita Ingles, RN make a nurse visit on 4/23 and ReDS was 44%. Lasix increased to 40 bid for 2 days then back down to 40 daily. Had to apply for [facility] assistance program to get more refills. BP was 132/62 at that visit.   She presents today for routine f/u. Says the last 2 weeks has been very rough. More SOB. Having frequent tachypalpitations - just about every day. Notes HR on Apple Watch with go from 70-80 up to 130-140 and stay there for 20 mins or more. Will get lightheaded during those times. More DOE. No swelling. +orthopnea. No PND.     Past Medical History:  Diagnosis Date  . Anemia    CHRONIC  . GERD (gastroesophageal reflux disease)    WITH PREGNANCY  . Pneumonia    x 1   Past Surgical History:  Procedure Laterality Date  . DILATION AND EVACUATION N/A 11/30/2016   Procedure: DILATATION AND EVACUATION;  Surgeon: Donnamae Jude, MD;  Location: [facility];  Service: Gynecology;  Laterality: N/A;  . WISDOM TOOTH EXTRACTION     NO ANESTHESIA     Current Outpatient Medications  Medication Sig Dispense Refill  . amiodarone (PACERONE) 200 MG tablet Take 1 tablet (200 mg total) by mouth daily. 90 tablet 0  . digoxin (LANOXIN) 0.125 MG tablet Take 1 tablet (0.125 mg total) by mouth daily. 90 tablet 0  . furosemide (LASIX) 40 MG  tablet Take 1 tablet (40 mg total) by mouth 2 (two) times daily. 60 tablet 6  . potassium chloride SA (K-DUR) 20 MEQ tablet Take 1 tablet (20 mEq total) by mouth daily. 90 tablet 0  . sacubitril-valsartan (ENTRESTO) 49-51 MG Take 1 tablet by mouth 2 (two) times daily. 180 tablet 0  . spironolactone (ALDACTONE) 25 MG tablet Take 0.5 tablets (12.5 mg total) by mouth daily. 45 tablet 0   No current facility-administered medications for this encounter.     Allergies:   Patient has no known allergies.   Social History:  The patient  reports that she has never smoked. She has never used smokeless tobacco. She reports that she does not drink alcohol or use drugs.   Family History:  The patient's family history includes Asthma in her mother; Cancer in her paternal grandmother; Cancer (age of onset: 84) in her maternal aunt; Epilepsy in her mother; Hypertension in her maternal grandmother.   ROS:  Please see the history of present illness.   All other systems are personally reviewed and negative.   Vitals:   11/14/18 1006  BP: 127/74  Pulse: 66  SpO2: 99%    Exam:  General:  Well appearing. No resp difficulty HEENT: normal Neck: supple. no JVD. Carotids 2+ bilat; no bruits. No lymphadenopathy or thryomegaly appreciated. Cor: PMI nondisplaced. Regular  rate & rhythm. No rubs, gallops or murmurs. Lungs: clear Abdomen: obese soft, nontender, nondistended. No hepatosplenomegaly. No bruits or masses. Good bowel sounds. Extremities: no cyanosis, clubbing, rash, trace edema Neuro: alert & orientedx3, cranial nerves grossly intact. moves all 4 extremities w/o difficulty. Affect pleasant   Recent Labs: 07/03/2018: ALT 62 07/05/2018: Hemoglobin 12.6; Magnesium 1.7; Platelets 445; TSH 0.235 09/09/2018: B Natriuretic Peptide 95.1; BUN 7; Creatinine, Ser 0.85; Potassium 3.5; Sodium 137  Personally reviewed   Wt Readings from Last 3 Encounters:  11/14/18 112.9 kg (249 lb)  07/31/18 110.2 kg (243 lb)   07/05/18 111.7 kg (246 lb 3.2 oz)     ECG NSR 65 inferolat TWI Personally reviewed  ASSESSMENT AND PLAN:  1. Acute systolic HF - New onset 4/62. Bedside echoLVEF 25% with moderate RV dysfunction - Almost certainly NICM. Either viral (had URI in week prior to admission and respiratory panel + rhinovirus) or post-partum or PVC cardiomyopathy - Weight stable  - Symptomatically a bit worse NYHA III - At home visit in April ReDS was up to 44%. ReDS today is 30% - Continue lasix 40 bid - Continue entresto to 49/51 - Continue digoxin 0.125 - Continue spiro 12.5 - Add carvedilol 3.125 bid with palpitations - Bediside echo today done personally EF 20-25% - Will need cMRI (if size permits) - refer to PharmD clinic - Discussed need to avoid further pregnancies and cease breastfeeding. (she is not planning on having more children and currently has IUD in place) - Given palpitations and low EF place ZioAT. Low threshold for ICD or lifevest   2. Snoring - Had WatchPat study 07/30/18 has moderate OSA (AHI 15.3). - Wearing CPAP  3. PVCs/Palpitations - In hospital was averaging about 8-10 PVCs per minute. May be contributing to LV dysfunction - Continue low dose amio 200 daily for suppression - ZioPatch AT place  Total time spent 40 minutes. Over half that time spent discussing above.    Signed, Glori Bickers, MD  11/14/2018 10:28 AM  Advanced Heart Failure [place] [address] [phone] (office) [phone] (fax)

## 2018-11-14 NOTE — Progress Notes (Signed)
ReDS Vest - 11/14/18 1000      ReDS Vest   MR   No  (Pended)     Estimated volume prior to reading  Med  (Pended)     Fitting Posture  Sitting  (Pended)     Height Marker  Short  (Pended)     Ruler Value  38  (Pended)     Center Strip  Aligned  (Pended)     ReDS Value  30  (Pended)

## 2018-11-14 NOTE — Addendum Note (Signed)
Encounter addended by: Scarlette Calico, RN on: 11/14/2018 11:30 AM  Actions taken: Order list changed, Diagnosis association updated

## 2018-12-01 ENCOUNTER — Inpatient Hospital Stay (HOSPITAL_COMMUNITY): Admission: RE | Admit: 2018-12-01 | Payer: Medicaid Other | Source: Ambulatory Visit

## 2018-12-03 ENCOUNTER — Other Ambulatory Visit: Payer: Self-pay

## 2018-12-05 DIAGNOSIS — G4733 Obstructive sleep apnea (adult) (pediatric): Secondary | ICD-10-CM | POA: Diagnosis not present

## 2018-12-08 ENCOUNTER — Other Ambulatory Visit: Payer: Self-pay

## 2018-12-08 ENCOUNTER — Ambulatory Visit (HOSPITAL_COMMUNITY)
Admission: RE | Admit: 2018-12-08 | Discharge: 2018-12-08 | Disposition: A | Discharge status: Home / Self Care | Payer: Medicaid Other | Source: Ambulatory Visit | Attending: Internal Medicine | Admitting: Internal Medicine

## 2018-12-08 VITALS — BP 130/98 | HR 68 | Wt 249.2 lb

## 2018-12-08 DIAGNOSIS — I493 Ventricular premature depolarization: Secondary | ICD-10-CM | POA: Insufficient documentation

## 2018-12-08 DIAGNOSIS — I5022 Chronic systolic (congestive) heart failure: Secondary | ICD-10-CM | POA: Diagnosis not present

## 2018-12-08 DIAGNOSIS — Z79899 Other long term (current) drug therapy: Secondary | ICD-10-CM | POA: Diagnosis not present

## 2018-12-08 DIAGNOSIS — G4733 Obstructive sleep apnea (adult) (pediatric): Secondary | ICD-10-CM | POA: Diagnosis not present

## 2018-12-08 DIAGNOSIS — R0683 Snoring: Secondary | ICD-10-CM | POA: Insufficient documentation

## 2018-12-08 DIAGNOSIS — R002 Palpitations: Secondary | ICD-10-CM | POA: Diagnosis not present

## 2018-12-08 DIAGNOSIS — R0602 Shortness of breath: Secondary | ICD-10-CM | POA: Insufficient documentation

## 2018-12-08 DIAGNOSIS — Z7901 Long term (current) use of anticoagulants: Secondary | ICD-10-CM | POA: Diagnosis not present

## 2018-12-08 MED ORDER — SPIRONOLACTONE 25 MG PO TABS: 25.0000 mg | ORAL_TABLET | Freq: Every day | ORAL | 6 refills | Status: DC

## 2018-12-08 MED ORDER — BIDIL 20-37.5 MG PO TABS: 1.0000 | ORAL_TABLET | Freq: Three times a day (TID) | ORAL | 6 refills | Status: DC

## 2018-12-08 NOTE — Telephone Encounter (Signed)
DME Changed to [facility].

## 2018-12-08 NOTE — Patient Instructions (Signed)
It was a pleasure seeing you today!  MEDICATIONS: -We are changing your medications today -Increase spironolactone to 25 mg (1 tab) daily. -Start taking Bidil 1 tablet three times daily. -We will work on [employer] PA for [employer].  -Call if you have questions about your medications.  NEXT APPOINTMENT: Return to clinic on 12/18/2018 with Dr. Haroldine Laws.  In general, to take care of your heart failure: -Limit your fluid intake to 2 Liters (half-gallon) per day.   -Limit your salt intake to ideally 2-3 grams (2000-3000 mg) per day. -Weigh yourself daily and record, and bring that "weight diary" to your next appointment.  (Weight gain of 2-3 pounds in 1 day typically means fluid weight.) -The medications for your heart are to help your heart and help you live longer.   -Please contact us before stopping any of your heart medications.  Call the clinic at [phone] with questions or to reschedule future appointments.

## 2018-12-09 DIAGNOSIS — G4733 Obstructive sleep apnea (adult) (pediatric): Secondary | ICD-10-CM | POA: Diagnosis not present

## 2018-12-09 NOTE — Progress Notes (Signed)
RC:[id number], Milford Cage, NP Cardiologist:No primary care provider on file. Primary HR:[id number]  HPI:  Tara Bradshaw is a 33 y/o woman with h/o mild HTN who was admitted in 3/20 with acute systolic HF with EF 0000000 in setting of recent viral illness and 78-month post-partum status. COVID testing negative.Did not undergo cath as felt to be NICM  Had Vanita Ingles, RN make a nurse visit on 4/23 and ReDS was 44%. Lasix increased to 40 bid for 2 days then back down to 40 daily. Had to apply for [facility] assistance program to get more refills. BP was 132/62 at that visit.   She recently presented to HF Clinic with Dr. Haroldine Laws on 11/14/2018. She reported the last 2 weeks have been "very rough". More SOB. Experienced frequent tachypalpitations - just about every day. Noted HR on Apple Watch goes from 70-80 up to 130-140 and will stay there for 20 mins or more. She reported lightheadedness during those times. More DOE. No swelling. No PND, but some orthopnea.  Today she returns to HF clinic for pharmacist medication titration. At last visit with MD, carvedilol 3.125 mg twice daily was initiated. Overall she is feeling well today and has tolerated new carvedilol well with no significant fatigue. She reports some dizziness and lightheadedness when her HR is elevated (110-130). This usually happens when walking/exercising and is stable from last visit. No orthostasis. She has reproducible chest pain over her right breast, which is likely musculoskeletal. She complains of significant SOB and cannot walk from the clinic waiting room to the clinic without becoming SOB. She is trying to walk daily but is limited by SOB. She does not weigh herself daily at home but believes her weight to be stable. She has trace edema bilaterally and 3 pillow orthopnea which is stable. No more PND and her new CPAP is helping her sleep through the night. ReDS 33% today. Her appetite is fine and she is trying to limit  salty foods.      Shortness of breath/dyspnea on exertion? yes   Orthopnea/PND? 3 pillow orthopnea, no PND  Edema? trace  Lightheadedness/dizziness? Yes - only when HR elevated   Daily weights at home? No - has a scale and I encouraged her to start weighing herself daily.  Blood pressure/heart rate monitoring at home? no  Following low-sodium/fluid-restricted diet? yes  HF Medications: Carvedilol 3.125 mg twice daily Entresto 49/51 mg twice daily Spironolactone 12.5 mg daily Digoxin 0.125 mg daily Furosemide 40 mg twice daily  Has the patient been experiencing any side effects to the medications prescribed?  no  Does the patient have any problems obtaining medications due to transportation or finances?   Yes - Has Medicaid. Waiting for Entresto PA to be completed.   Understanding of regimen: good Understanding of indications: good Potential of compliance: good Patient understands to avoid NSAIDs. Patient understands to avoid decongestants.   Pertinent Lab Values - 11/14/18  Serum creatinine 0.59, BUN 5, Potassium 4.0, Sodium 137, BNP 247.7, Magnesium 2.0, Digoxin - none   Vital Signs:  Weight: 249 (last clinic weight 249.2 lb)  Blood pressure: 130/98   Heart rate: 68   Assessment:  1. Acute systolic HF -New onset 0000000. Bedside echoLVEF 25% with moderate RV dysfunction - Almost certainly NICM. Either viral (had URI in week prior to admissionand respiratory panel + rhinovirus) or post-partumorPVC cardiomyopathy - Volume status stable. ReDS 33%. -NYHA III - BP stable. - Continue furosemide 40 mg twice daily -Continueentresto to 49/51 mg twice daily. Will  provide her with samples today and follow-up on status of PA (Medicaid).  - Continue digoxin 0.125. Consider obtaining dig level at next visit.  -Continue carvedilol 3.125 twice daily.  - Increase spironolactone to 25 mg daily. BMET in 10 days. - Start Bidil 20-37.5 1 tab TID - Consider  obtaining iron panel at next visit.   2. Snoring - Had WatchPat study 07/30/18 has moderate OSA (AHI 15.3). -Wearing CPAP  3. PVCs/Palpitations - In hospital was averaging about 8-10 PVCs per minute. May be contributing to LV dysfunction - Continue low dose amio 200 daily for suppression - ZioPatch AT place  Plan: 1) Medication changes: Based on clinical presentation, vital signs and recent labs will increase spironolactone to 25 mg daily and add Bidil 1 tab TID. 2) Labs: Fe panel/BMET in 10 days with increase in spiro 3) Follow-up: HF Clinic with Dr. Haroldine Laws in 10 days  Audry Riles, PharmD, [facility] Pharmacist [phone]  Patient seen, assessed and discussed with Dr. Lynelle Smoke.  Agree with assessment and plan  Bonnita Nasuti Pharm.D. CPP, BCPS Clinical Pharmacist [phone] 12/09/2018 2:23 PM

## 2018-12-10 ENCOUNTER — Telehealth (HOSPITAL_COMMUNITY): Payer: Self-pay | Admitting: Pharmacist

## 2018-12-10 NOTE — Telephone Encounter (Signed)
Advanced Heart Failure Patient Advocate Encounter  Prior Authorization for Delene Loll has been approved.    PA# [id number] Effective dates: 12/10/2018 through 12/05/2019  Patients co-pay is $3.00  Patient was informed.  Audry Riles, PharmD, BCPS Heart Failure Clinic Pharmacist [phone]

## 2018-12-10 NOTE — Telephone Encounter (Signed)
Patient Advocate Encounter  Prior authorization for Tara Bradshaw is required.  PA submitted on [employer] [id number] W Status is pending  Will continue to follow.   Audry Riles, PharmD, BCPS Heart Failure Clinic Pharmacist [phone]

## 2018-12-18 ENCOUNTER — Telehealth (HOSPITAL_COMMUNITY): Payer: Self-pay | Admitting: *Deleted

## 2018-12-18 ENCOUNTER — Other Ambulatory Visit: Payer: Self-pay

## 2018-12-18 ENCOUNTER — Other Ambulatory Visit (HOSPITAL_COMMUNITY): Payer: 59

## 2018-12-18 ENCOUNTER — Ambulatory Visit (HOSPITAL_COMMUNITY)
Admission: RE | Admit: 2018-12-18 | Discharge: 2018-12-18 | Disposition: A | Discharge status: Home / Self Care | Payer: Medicaid Other | Source: Ambulatory Visit | Attending: Internal Medicine | Admitting: Internal Medicine

## 2018-12-18 ENCOUNTER — Encounter (HOSPITAL_COMMUNITY): Payer: Self-pay | Admitting: Internal Medicine

## 2018-12-18 VITALS — BP 133/86 | HR 80 | Wt 250.8 lb

## 2018-12-18 DIAGNOSIS — Z79899 Other long term (current) drug therapy: Secondary | ICD-10-CM | POA: Diagnosis not present

## 2018-12-18 DIAGNOSIS — I5022 Chronic systolic (congestive) heart failure: Secondary | ICD-10-CM | POA: Diagnosis not present

## 2018-12-18 DIAGNOSIS — I110 Hypertensive heart disease with heart failure: Secondary | ICD-10-CM | POA: Insufficient documentation

## 2018-12-18 DIAGNOSIS — I5023 Acute on chronic systolic (congestive) heart failure: Secondary | ICD-10-CM | POA: Diagnosis not present

## 2018-12-18 DIAGNOSIS — I493 Ventricular premature depolarization: Secondary | ICD-10-CM | POA: Diagnosis not present

## 2018-12-18 DIAGNOSIS — I428 Other cardiomyopathies: Secondary | ICD-10-CM | POA: Diagnosis not present

## 2018-12-18 DIAGNOSIS — G4733 Obstructive sleep apnea (adult) (pediatric): Secondary | ICD-10-CM | POA: Diagnosis not present

## 2018-12-18 DIAGNOSIS — Z8249 Family history of ischemic heart disease and other diseases of the circulatory system: Secondary | ICD-10-CM | POA: Insufficient documentation

## 2018-12-18 LAB — BASIC METABOLIC PANEL
Anion gap: 8 (ref 5–15)
BUN: 9 mg/dL (ref 6–20)
CO2: 21 mmol/L — ABNORMAL LOW (ref 22–32)
Calcium: 9.1 mg/dL (ref 8.9–10.3)
Chloride: 110 mmol/L (ref 98–111)
Creatinine, Ser: 0.56 mg/dL (ref 0.44–1.00)
GFR calc Af Amer: >60 mL/min (ref >60)
GFR calc non Af Amer: >60 mL/min (ref >60)
Glucose, Bld: 89 mg/dL (ref 70–99)
Potassium: 3.7 mmol/L (ref 3.5–5.1)
Sodium: 139 mmol/L (ref 135–145)

## 2018-12-18 LAB — BRAIN NATRIURETIC PEPTIDE: B Natriuretic Peptide: 515.4 pg/mL — ABNORMAL HIGH (ref 0.0–100.0)

## 2018-12-18 MED ORDER — SACUBITRIL-VALSARTAN 97-103 MG PO TABS: 1.0000 | ORAL_TABLET | Freq: Two times a day (BID) | ORAL | 6 refills | Status: DC

## 2018-12-18 NOTE — Patient Instructions (Signed)
Lab work done today. We will notify you of any abnormal lab work. No news is good news!  INCREASE Entresto to 97/103 mg tab two times daily.  Your physician has requested that you have an echocardiogram. Echocardiography is a painless test that uses sound waves to create images of your heart. It provides your doctor with information about the size and shape of your heart and how well your heart's chambers and valves are working. This procedure takes approximately one hour. There are no restrictions for this procedure. This will be done at your follow up visit in 1 month.  Please follow up with the [facility] in 2 weeks.  Please follow up with the Advance Heart Failure Clinic in 1 month.  At the [facility], you and your health needs are our priority. As part of our continuing mission to provide you with exceptional heart care, we have created designated Provider Care Teams. These Care Teams include your primary Cardiologist (physician) and Advanced Practice Providers (APPs- Physician Assistants and Nurse Practitioners) who all work together to provide you with the care you need, when you need it.   You may see any of the following providers on your designated Care Team at your next follow up: Marland Kitchen Dr Glori Bickers . Dr Loralie Champagne . Darrick Grinder, NP   Please be sure to bring in all your medications bottles to every appointment.

## 2018-12-18 NOTE — Progress Notes (Signed)
ReDS Vest - 12/18/18 1200      ReDS Vest   Estimated volume prior to reading  Med    Fitting Posture  Sitting    Height Marker  Short    Ruler Value  35    Center Strip  Aligned    ReDS Value  36    Anatomical Comments  station B

## 2018-12-18 NOTE — Progress Notes (Signed)
Advanced Heart Failure Clinic Note    Date:  12/18/2018   ID:  Tara Bradshaw, DOB [birth year], MRN [id number]  Location: Home  Provider location: [facility] Type of Visit: Established patient  PCP:  Kerin Perna, NP  Cardiologist:  No primary care provider on file. Primary HF: Tara Bradshaw  Chief Complaint: Heart Failure follow-up   History of Present Illness:  Ms. Delgiorno is a 33 y/o woman with h/o mild HTN who was admitted in 3/20 with acute systolic HF with EF 0000000 in setting of recent viral illness and 53-month post-partum status. COVID testing negative. Did not undergo catha s felt to be NICM   Had Vanita Ingles, RN make a nurse visit on 4/23 and ReDS was 44%. Lasix increased to 40 bid for 2 days then back down to 40 daily. Had to apply for [facility] assistance program to get more refills. BP was 132/62 at that visit.   She presents today for routine f/u. Having good days and bad days. On good days can get around and go to store. On bad days doesn't want to do anything. Back hurts. SOB with ADLs. Fatigued. Taking all medications as prescribed. Taking lasix 40 bid. Occasionally will take extra.   ReDS today 36%  ZioPatch 8/20 NSR. Occasional PVCs. 9-beat run NSVT    Past Medical History:  Diagnosis Date  . Anemia    CHRONIC  . GERD (gastroesophageal reflux disease)    WITH PREGNANCY  . Pneumonia    x 1   Past Surgical History:  Procedure Laterality Date  . DILATION AND EVACUATION N/A 11/30/2016   Procedure: DILATATION AND EVACUATION;  Surgeon: Donnamae Jude, MD;  Location: [facility];  Service: Gynecology;  Laterality: N/A;  . WISDOM TOOTH EXTRACTION     NO ANESTHESIA     Current Outpatient Medications  Medication Sig Dispense Refill  . amiodarone (PACERONE) 200 MG tablet Take 1 tablet (200 mg total) by mouth daily. 90 tablet 0  . carvedilol (COREG) 3.125 MG tablet Take 1 tablet (3.125 mg total) by mouth 2 (two) times daily.  180 tablet 3  . digoxin (LANOXIN) 0.125 MG tablet Take 1 tablet (0.125 mg total) by mouth daily. 90 tablet 0  . furosemide (LASIX) 40 MG tablet Take 1 tablet (40 mg total) by mouth 2 (two) times daily. 60 tablet 6  . isosorbide-hydrALAZINE (BIDIL) 20-37.5 MG tablet Take 1 tablet by mouth 3 (three) times daily. 90 tablet 6  . potassium chloride SA (K-DUR) 20 MEQ tablet Take 1 tablet (20 mEq total) by mouth daily. 90 tablet 0  . sacubitril-valsartan (ENTRESTO) 49-51 MG Take 1 tablet by mouth 2 (two) times daily. 180 tablet 0  . spironolactone (ALDACTONE) 25 MG tablet Take 1 tablet (25 mg total) by mouth daily. 30 tablet 6   No current facility-administered medications for this encounter.     Allergies:   Patient has no known allergies.   Social History:  The patient  reports that she has never smoked. She has never used smokeless tobacco. She reports that she does not drink alcohol or use drugs.   Family History:  The patient's family history includes Asthma in her mother; Cancer in her paternal grandmother; Cancer (age of onset: 91) in her maternal aunt; Epilepsy in her mother; Hypertension in her maternal grandmother.   ROS:  Please see the history of present illness.   All other systems are personally reviewed and negative.   Vitals:  12/18/18 1159  BP: 133/86  Pulse: 80  SpO2: 98%    Exam:  General:  Well appearing. No resp difficulty HEENT: normal Neck: supple. no JVD. Carotids 2+ bilat; no bruits. No lymphadenopathy or thryomegaly appreciated. Cor: PMI nondisplaced. Regular rate & rhythm. No rubs, gallops or murmurs. Lungs: clear Abdomen: obese soft, nontender, nondistended. No hepatosplenomegaly. No bruits or masses. Good bowel sounds. Extremities: no cyanosis, clubbing, rash, edema Neuro: alert & orientedx3, cranial nerves grossly intact. moves all 4 extremities w/o difficulty. Affect pleasant    Recent Labs: 07/05/2018: TSH 0.235 11/14/2018: ALT 13; B Natriuretic Peptide  247.7; BUN 5; Creatinine, Ser 0.59; Hemoglobin 11.7; Magnesium 2.0; Platelets 256; Potassium 4.0; Sodium 137  Personally reviewed   Wt Readings from Last 3 Encounters:  12/18/18 113.8 kg (250 lb 12.8 oz)  12/08/18 113 kg (249 lb 3.2 oz)  11/14/18 112.9 kg (249 lb)      ASSESSMENT AND PLAN:  1. Acute systolic HF - New onset 0000000. Bedside echoLVEF 25% with moderate RV dysfunction - Almost certainly NICM. Either viral (had URI in week prior to admission and respiratory panel + rhinovirus) or post-partum or PVC cardiomyopathy - Weight stable  - Symptomatically stable NYHA III - At home visit in April ReDS was up to 44%. ReDS today is 36% - Continue lasix 40 bid - Increase entresto to 97/103 - Continue digoxin 0.125 - Continue spiro 12.5 - Continue Bidil 1 tab tid - Continue carvedilol 3.125 bid  - Consider SGLT2i after other meds titrated  - Bediside echo 11/14/18 done personally EF 20-25% - Will need cMRI (if size permits)t - Get labs today - 2 week visit with PharmD to titrate Bidil  - F/u with me in 4 week with echo. If EF not improved consider ICD  2. Snoring - Had WatchPat study 07/30/18 has moderate OSA (AHI 15.3). - Wearing CPAP  3. PVCs/Palpitations - In hospital was averaging about 8-10 PVCs per minute. May be contributing to LV dysfunction - Continue low dose amio 200 daily for suppression - ZioPatch AT done. NSR one 9-beat run NSVT. Need whole report to quantify PVC burden.   Signed, Glori Bickers, MD  12/18/2018 12:17 PM  Advanced Heart Failure [place] [address] [place] [address] [phone] (office) [phone] (fax)

## 2018-12-18 NOTE — Addendum Note (Signed)
Encounter addended by: Marlise Eves, RN on: 12/18/2018 12:38 PM  Actions taken: Pharmacy for encounter modified, Charge Capture section accepted, Clinical Note Signed, Order list changed, Diagnosis association updated

## 2018-12-18 NOTE — Telephone Encounter (Signed)
Received message from [facility] that pt's monitor was returned to them damaged and they could only pull a little info from it and they recommend she repeat with a new monitor.  Dr Haroldine Laws is aware and would like pt to repeat Zio for only 3 days to quantify her PVC's.  Pt is agreeable and is aware Elwyn Reach will ship a new monitor to her home for her to place.

## 2018-12-26 ENCOUNTER — Encounter (HOSPITAL_COMMUNITY): Payer: Medicaid Other | Admitting: Internal Medicine

## 2018-12-29 NOTE — Telephone Encounter (Signed)
Patient has a 10 week follow up appointment scheduled for 01/21/19. Patient understands she needs to keep this appointment for insurance compliance. Left detailed message on voicemail with date and time and informed patient to call back to confirm or reschedule.

## 2018-12-31 NOTE — Telephone Encounter (Signed)
Patient was seen on 5/26 by Dr Haroldine Laws and Dr Radford Pax ordered to get a d/l in 2 weeks 6/11= 2wks . No d/l was printed patient was sent back from [facility] and placed with [employer]. Download will be printed for her 10 week visit.

## 2019-01-01 ENCOUNTER — Inpatient Hospital Stay (HOSPITAL_COMMUNITY)
Admission: RE | Admit: 2019-01-01 | Discharge: 2019-01-01 | Disposition: A | Discharge status: Home / Self Care | Payer: Medicaid Other | Source: Ambulatory Visit

## 2019-01-06 NOTE — Addendum Note (Signed)
Encounter addended by: Micki Riley, RN on: 01/06/2019 10:55 AM  Actions taken: Imaging Exam ended

## 2019-01-07 ENCOUNTER — Telehealth (HOSPITAL_COMMUNITY): Payer: Self-pay | Admitting: Pharmacist

## 2019-01-07 NOTE — Telephone Encounter (Signed)
Patient missed pharmacy clinic appointment for medication titration on 01/01/19. Have attempted to reach patient to reschedule and left voicemail, but unable to reach patient at this time. Left message reminding her about her appointments on 01/15/2019 for echo and HF clinic with Dr. Haroldine Laws.   Audry Riles, PharmD, BCPS, CPP Heart Failure Clinic Pharmacist [phone]

## 2019-01-08 DIAGNOSIS — G4733 Obstructive sleep apnea (adult) (pediatric): Secondary | ICD-10-CM | POA: Diagnosis not present

## 2019-01-13 ENCOUNTER — Telehealth: Payer: Self-pay | Admitting: *Deleted

## 2019-01-13 NOTE — Addendum Note (Signed)
Encounter addended by: Micki Riley, RN on: 01/13/2019 11:58 AM  Actions taken: Imaging Exam ended

## 2019-01-13 NOTE — Telephone Encounter (Signed)

## 2019-01-15 ENCOUNTER — Ambulatory Visit (HOSPITAL_COMMUNITY)
Admission: RE | Admit: 2019-01-15 | Discharge: 2019-01-15 | Disposition: A | Discharge status: Home / Self Care | Payer: Medicaid Other | Source: Ambulatory Visit | Attending: Internal Medicine | Admitting: Internal Medicine

## 2019-01-15 ENCOUNTER — Ambulatory Visit (HOSPITAL_BASED_OUTPATIENT_CLINIC_OR_DEPARTMENT_OTHER)
Admission: RE | Admit: 2019-01-15 | Discharge: 2019-01-15 | Disposition: A | Discharge status: Home / Self Care | Payer: Medicaid Other | Source: Ambulatory Visit | Attending: Internal Medicine | Admitting: Internal Medicine

## 2019-01-15 ENCOUNTER — Other Ambulatory Visit: Payer: Self-pay

## 2019-01-15 ENCOUNTER — Encounter (HOSPITAL_COMMUNITY): Payer: Self-pay | Admitting: Internal Medicine

## 2019-01-15 VITALS — BP 124/92 | HR 84 | Wt 251.2 lb

## 2019-01-15 DIAGNOSIS — Z809 Family history of malignant neoplasm, unspecified: Secondary | ICD-10-CM | POA: Insufficient documentation

## 2019-01-15 DIAGNOSIS — I493 Ventricular premature depolarization: Secondary | ICD-10-CM | POA: Diagnosis not present

## 2019-01-15 DIAGNOSIS — G4733 Obstructive sleep apnea (adult) (pediatric): Secondary | ICD-10-CM | POA: Insufficient documentation

## 2019-01-15 DIAGNOSIS — Z8249 Family history of ischemic heart disease and other diseases of the circulatory system: Secondary | ICD-10-CM | POA: Diagnosis not present

## 2019-01-15 DIAGNOSIS — R42 Dizziness and giddiness: Secondary | ICD-10-CM | POA: Diagnosis not present

## 2019-01-15 DIAGNOSIS — I509 Heart failure, unspecified: Secondary | ICD-10-CM

## 2019-01-15 DIAGNOSIS — Z79899 Other long term (current) drug therapy: Secondary | ICD-10-CM | POA: Insufficient documentation

## 2019-01-15 DIAGNOSIS — I472 Ventricular tachycardia: Secondary | ICD-10-CM | POA: Insufficient documentation

## 2019-01-15 DIAGNOSIS — I5022 Chronic systolic (congestive) heart failure: Secondary | ICD-10-CM | POA: Diagnosis not present

## 2019-01-15 DIAGNOSIS — I081 Rheumatic disorders of both mitral and tricuspid valves: Secondary | ICD-10-CM | POA: Diagnosis not present

## 2019-01-15 DIAGNOSIS — I110 Hypertensive heart disease with heart failure: Secondary | ICD-10-CM | POA: Diagnosis not present

## 2019-01-15 DIAGNOSIS — I5021 Acute systolic (congestive) heart failure: Secondary | ICD-10-CM | POA: Insufficient documentation

## 2019-01-15 LAB — PROTIME-INR
INR: 1 (ref 0.8–1.2)
Prothrombin Time: 12.7 seconds (ref 11.4–15.2)

## 2019-01-15 LAB — CBC
HCT: 38.8 % (ref 36.0–46.0)
Hemoglobin: 12.5 g/dL (ref 12.0–15.0)
MCH: 27.2 pg (ref 26.0–34.0)
MCHC: 32.2 g/dL (ref 30.0–36.0)
MCV: 84.3 fL (ref 80.0–100.0)
Platelets: 227 10*3/uL (ref 150–400)
RBC: 4.6 MIL/uL (ref 3.87–5.11)
RDW: 13.8 % (ref 11.5–15.5)
WBC: 6.2 10*3/uL (ref 4.0–10.5)
nRBC: 0 % (ref 0.0–0.2)

## 2019-01-15 LAB — COMPREHENSIVE METABOLIC PANEL
ALT: 19 U/L (ref 0–44)
AST: 21 U/L (ref 15–41)
Albumin: 4 g/dL (ref 3.5–5.0)
Alkaline Phosphatase: 57 U/L (ref 38–126)
Anion gap: 8 (ref 5–15)
BUN: 8 mg/dL (ref 6–20)
CO2: 24 mmol/L (ref 22–32)
Calcium: 9.4 mg/dL (ref 8.9–10.3)
Chloride: 105 mmol/L (ref 98–111)
Creatinine, Ser: 0.66 mg/dL (ref 0.44–1.00)
GFR calc Af Amer: >60 mL/min (ref >60)
GFR calc non Af Amer: >60 mL/min (ref >60)
Glucose, Bld: 90 mg/dL (ref 70–99)
Potassium: 3.9 mmol/L (ref 3.5–5.1)
Sodium: 137 mmol/L (ref 135–145)
Total Bilirubin: 0.3 mg/dL (ref 0.3–1.2)
Total Protein: 7.6 g/dL (ref 6.5–8.1)

## 2019-01-15 LAB — T4, FREE: Free T4: 0.8 ng/dL (ref 0.61–1.12)

## 2019-01-15 LAB — TSH: TSH: 0.465 u[IU]/mL (ref 0.350–4.500)

## 2019-01-15 MED ORDER — SODIUM CHLORIDE 0.9% FLUSH: 3.0000 mL | Freq: Two times a day (BID) | INTRAVENOUS | Status: DC

## 2019-01-15 MED ORDER — MECLIZINE HCL 25 MG PO TABS: 25.0000 mg | ORAL_TABLET | Freq: Three times a day (TID) | ORAL | 0 refills | Status: DC | PRN

## 2019-01-15 NOTE — H&P (View-Only) (Signed)
Advanced Heart Failure Clinic Note    Date:  01/15/2019   ID:  Tara Bradshaw, DOB 1985/05/11, MRN [id number]  Location: Home  Provider location: [facility] Type of Visit: Established patient  PCP:  Tara Perna, NP  Cardiologist:  No primary care provider on file. Primary HF: Bensimhon  Chief Complaint: Heart Failure follow-up   History of Present Illness:  [name] is a 33 y/o woman with h/o mild HTN who was admitted in 3/20 with acute systolic HF with EF 0000000 in setting of recent viral illness and 76-month post-partum status. COVID testing negative. Did not undergo cath as felt to be NICM   Had Vanita Ingles, RN make a nurse visit on 4/23 and ReDS was 44%. Lasix increased to 40 bid for 2 days then back down to 40 daily. Had to apply for [facility] assistance program to get more refills. BP was 132/62 at that visit.   She presents today for routine f/u. Feels tired. Easily worn out. Struggles even with ADLs. No edema or PND. Mild orthopnea. + vertigo. Recently started carvedilol .  Echo today EF 25-30% (read as 30-35%)  ZioPatch 8/20 NSR. Occasional PVCs (3.5%) 9-beat run NSVT    Past Medical History:  Diagnosis Date  . Anemia    CHRONIC  . GERD (gastroesophageal reflux disease)    WITH PREGNANCY  . Pneumonia    x 1   Past Surgical History:  Procedure Laterality Date  . DILATION AND EVACUATION N/A 11/30/2016   Procedure: DILATATION AND EVACUATION;  Surgeon: Donnamae Jude, MD;  Location: [facility];  Service: Gynecology;  Laterality: N/A;  . WISDOM TOOTH EXTRACTION     NO ANESTHESIA     Current Outpatient Medications  Medication Sig Dispense Refill  . amiodarone (PACERONE) 200 MG tablet Take 1 tablet (200 mg total) by mouth daily. 90 tablet 0  . carvedilol (COREG) 3.125 MG tablet Take 1 tablet (3.125 mg total) by mouth 2 (two) times daily. 180 tablet 3  . digoxin (LANOXIN) 0.125 MG tablet Take 1 tablet (0.125 mg total) by  mouth daily. 90 tablet 0  . furosemide (LASIX) 40 MG tablet Take 1 tablet (40 mg total) by mouth 2 (two) times daily. 60 tablet 6  . isosorbide-hydrALAZINE (BIDIL) 20-37.5 MG tablet Take 1 tablet by mouth 3 (three) times daily. 90 tablet 6  . potassium chloride SA (K-DUR) 20 MEQ tablet Take 1 tablet (20 mEq total) by mouth daily. 90 tablet 0  . sacubitril-valsartan (ENTRESTO) 97-103 MG Take 1 tablet by mouth 2 (two) times daily. 60 tablet 6  . spironolactone (ALDACTONE) 25 MG tablet Take 1 tablet (25 mg total) by mouth daily. 30 tablet 6   No current facility-administered medications for this encounter.     Allergies:   Patient has no known allergies.   Social History:  The patient  reports that she has never smoked. She has never used smokeless tobacco. She reports that she does not drink alcohol or use drugs.   Family History:  The patient's family history includes Asthma in her mother; Cancer in her paternal grandmother; Cancer (age of onset: 70) in her maternal aunt; Epilepsy in her mother; Hypertension in her maternal grandmother.   ROS:  Please see the history of present illness.   All other systems are personally reviewed and negative.   Vitals:   01/15/19 1514  BP: (!) 124/92  Pulse: 84  SpO2: 99%    Exam:  General:  Fatigued appearing. No resp difficulty HEENT: normal Neck: supple. no JVD. Carotids 2+ bilat; no bruits. No lymphadenopathy or thryomegaly appreciated. Cor: PMI nondisplaced. Regular rate & rhythm. No rubs, gallops or murmurs. Lungs: clear Abdomen: obese soft, nontender, nondistended. No hepatosplenomegaly. No bruits or masses. Good bowel sounds. Extremities: no cyanosis, clubbing, rash, edema Neuro: alert & orientedx3, cranial nerves grossly intact. moves all 4 extremities w/o difficulty. Affect pleasant     Recent Labs: 07/05/2018: TSH 0.235 11/14/2018: ALT 13; Hemoglobin 11.7; Magnesium 2.0; Platelets 256 12/18/2018: B Natriuretic Peptide 515.4; BUN 9;  Creatinine, Ser 0.56; Potassium 3.7; Sodium 139  Personally reviewed   Wt Readings from Last 3 Encounters:  01/15/19 113.9 kg (251 lb 3.2 oz)  12/18/18 113.8 kg (250 lb 12.8 oz)  12/08/18 113 kg (249 lb 3.2 oz)      ASSESSMENT AND PLAN:  1. Acute systolic HF - New onset 0000000. EchoLVEF 25% with moderate RV dysfunction - Suspect NICM. Either viral (had URI in week prior to admission and respiratory panel + rhinovirus) or post-partum or PVC cardiomyopathy - Weight stable  - Symptomatically worse NYHA IIIb-IV - Continue lasix 40 bid - Continue entresto to 97/103 - Continue digoxin 0.125 - Continue spiro 12.5 - Continue Bidil 1 tab tid - Continue carvedilol 3.125 bid  - Consider SGLT2i after other meds titrated  - Bediside echo 11/14/18 done personally EF 20-25% - Echo 25-30% today.  - Not candidate for cMRI due to size - She continues to struggle NYHA IIIB with persistent severe LV dysfunction. Will refer to EP for ICD. Schedule R/L cath +/- CPX  2. Snoring - Had WatchPat study 07/30/18 has moderate OSA (AHI 15.3). - Wearing CPAP  3. PVCs/Palpitations - In hospital was averaging about 8-10 PVCs per minute. May be contributing to LV dysfunction - Continue low dose amio 200 daily for suppression - ZioPatch AT done. NSR one 9-beat run NSVT. PVC 3.5%  4. Vertigo - start meclizine   Signed, Glori Bickers, MD  01/15/2019 3:32 PM  Advanced Heart Failure [place] [address] Heart and [place] [address] [phone] (office) [phone] (fax)

## 2019-01-15 NOTE — Patient Instructions (Signed)
Lab work done today. We will notify you of any abnormal lab work. No news is good news!  You have been referred to Cardiac Electrophysiology. They will contact you in order to schedule an appointment.  START Meclizine 25mg  one tab three times daily as needed for dizziness.  Please follow up with the [facility] in 3 months. We currently do not have that schedule. Please call us at [phone] option #3 in November 2020 in order to schedule an appointment for January 2021.  At the [facility], you and your health needs are our priority. As part of our continuing mission to provide you with exceptional heart care, we have created designated Provider Care Teams. These Care Teams include your primary Cardiologist (physician) and Advanced Practice Providers (APPs- Physician Assistants and Nurse Practitioners) who all work together to provide you with the care you need, when you need it.   You may see any of the following providers on your designated Care Team at your next follow up: Marland Kitchen Dr Glori Bickers . Dr Loralie Champagne . Darrick Grinder, NP . Lyda Jester, PA   Please be sure to bring in all your medications bottles to every appointment.      [facility] [place] [id number] [place] [address] Dept: [phone] Loc: [phone]  Tobe Stanberry  01/15/2019  You are scheduled for a Cardiac Catheterization on Thursday, October 15 with Dr. Glori Bickers.  1. Please arrive at the [facility] (Main Entrance A) at [facility]: [address] [place], [place] [address] at 6:30 AM (This time is two hours before your procedure to ensure your preparation). Free valet parking service is available.   Special note: Every effort is made to have your procedure done on time. Please understand that emergencies sometimes delay scheduled procedures.  2. Diet: Do  not eat solid foods after midnight.  The patient may have clear liquids until 5am upon the day of the procedure.  3. Labs: You had your blood drawn on 01/15/2019.  4. Medication instructions in preparation for your procedure:   Contrast Allergy: No  DO NOT TAKE  Furosemide or Spironolactone the morning of your procedure. You may take them after unless otherwise specified by your Physician.  5. Plan for one night stay--bring personal belongings. 6. Bring a current list of your medications and current insurance cards. 7. You MUST have a responsible person to drive you home. 8. Someone MUST be with you the first 24 hours after you arrive home or your discharge will be delayed. 9. Please wear clothes that are easy to get on and off and wear slip-on shoes.  Thank you for allowing [place] to care for you!   -- [facility]

## 2019-01-15 NOTE — Progress Notes (Signed)
Sign and held orders placed for r/l heart cath 01/22/19. Pt to have covid test 01/19/19. Pt verbalized understanding of all orders.

## 2019-01-15 NOTE — Progress Notes (Signed)
Advanced Heart Failure Clinic Note    Date:  01/15/2019   ID:  Tara Bradshaw, DOB Jan 12, 1986, MRN [id number]  Location: Home  Provider location: [facility] Type of Visit: Established patient  PCP:  Kerin Perna, NP  Cardiologist:  No primary care provider on file. Primary HF: Latrell Potempa  Chief Complaint: Heart Failure follow-up   History of Present Illness:  Tara Bradshaw is a 33 y/o woman with h/o mild HTN who was admitted in 3/20 with acute systolic HF with EF 0000000 in setting of recent viral illness and 47-month post-partum status. COVID testing negative. Did not undergo cath as felt to be NICM   Had Vanita Ingles, RN make a nurse visit on 4/23 and ReDS was 44%. Lasix increased to 40 bid for 2 days then back down to 40 daily. Had to apply for [facility] assistance program to get more refills. BP was 132/62 at that visit.   She presents today for routine f/u. Feels tired. Easily worn out. Struggles even with ADLs. No edema or PND. Mild orthopnea. + vertigo. Recently started carvedilol .  Echo today EF 25-30% (read as 30-35%)  ZioPatch 8/20 NSR. Occasional PVCs (3.5%) 9-beat run NSVT    Past Medical History:  Diagnosis Date  . Anemia    CHRONIC  . GERD (gastroesophageal reflux disease)    WITH PREGNANCY  . Pneumonia    x 1   Past Surgical History:  Procedure Laterality Date  . DILATION AND EVACUATION N/A 11/30/2016   Procedure: DILATATION AND EVACUATION;  Surgeon: Donnamae Jude, MD;  Location: [facility];  Service: Gynecology;  Laterality: N/A;  . WISDOM TOOTH EXTRACTION     NO ANESTHESIA     Current Outpatient Medications  Medication Sig Dispense Refill  . amiodarone (PACERONE) 200 MG tablet Take 1 tablet (200 mg total) by mouth daily. 90 tablet 0  . carvedilol (COREG) 3.125 MG tablet Take 1 tablet (3.125 mg total) by mouth 2 (two) times daily. 180 tablet 3  . digoxin (LANOXIN) 0.125 MG tablet Take 1 tablet (0.125 mg total) by  mouth daily. 90 tablet 0  . furosemide (LASIX) 40 MG tablet Take 1 tablet (40 mg total) by mouth 2 (two) times daily. 60 tablet 6  . isosorbide-hydrALAZINE (BIDIL) 20-37.5 MG tablet Take 1 tablet by mouth 3 (three) times daily. 90 tablet 6  . potassium chloride SA (K-DUR) 20 MEQ tablet Take 1 tablet (20 mEq total) by mouth daily. 90 tablet 0  . sacubitril-valsartan (ENTRESTO) 97-103 MG Take 1 tablet by mouth 2 (two) times daily. 60 tablet 6  . spironolactone (ALDACTONE) 25 MG tablet Take 1 tablet (25 mg total) by mouth daily. 30 tablet 6   No current facility-administered medications for this encounter.     Allergies:   Patient has no known allergies.   Social History:  The patient  reports that she has never smoked. She has never used smokeless tobacco. She reports that she does not drink alcohol or use drugs.   Family History:  The patient's family history includes Asthma in her mother; Cancer in her paternal grandmother; Cancer (age of onset: 67) in her maternal aunt; Epilepsy in her mother; Hypertension in her maternal grandmother.   ROS:  Please see the history of present illness.   All other systems are personally reviewed and negative.   Vitals:   01/15/19 1514  BP: (!) 124/92  Pulse: 84  SpO2: 99%    Exam:  General:  Fatigued appearing. No resp difficulty HEENT: normal Neck: supple. no JVD. Carotids 2+ bilat; no bruits. No lymphadenopathy or thryomegaly appreciated. Cor: PMI nondisplaced. Regular rate & rhythm. No rubs, gallops or murmurs. Lungs: clear Abdomen: obese soft, nontender, nondistended. No hepatosplenomegaly. No bruits or masses. Good bowel sounds. Extremities: no cyanosis, clubbing, rash, edema Neuro: alert & orientedx3, cranial nerves grossly intact. moves all 4 extremities w/o difficulty. Affect pleasant     Recent Labs: 07/05/2018: TSH 0.235 11/14/2018: ALT 13; Hemoglobin 11.7; Magnesium 2.0; Platelets 256 12/18/2018: B Natriuretic Peptide 515.4; BUN 9;  Creatinine, Ser 0.56; Potassium 3.7; Sodium 139  Personally reviewed   Wt Readings from Last 3 Encounters:  01/15/19 113.9 kg (251 lb 3.2 oz)  12/18/18 113.8 kg (250 lb 12.8 oz)  12/08/18 113 kg (249 lb 3.2 oz)      ASSESSMENT AND PLAN:  1. Acute systolic HF - New onset 0000000. EchoLVEF 25% with moderate RV dysfunction - Suspect NICM. Either viral (had URI in week prior to admission and respiratory panel + rhinovirus) or post-partum or PVC cardiomyopathy - Weight stable  - Symptomatically worse NYHA IIIb-IV - Continue lasix 40 bid - Continue entresto to 97/103 - Continue digoxin 0.125 - Continue spiro 12.5 - Continue Bidil 1 tab tid - Continue carvedilol 3.125 bid  - Consider SGLT2i after other meds titrated  - Bediside echo 11/14/18 done personally EF 20-25% - Echo 25-30% today.  - Not candidate for cMRI due to size - She continues to struggle NYHA IIIB with persistent severe LV dysfunction. Will refer to EP for ICD. Schedule R/L cath +/- CPX  2. Snoring - Had WatchPat study 07/30/18 has moderate OSA (AHI 15.3). - Wearing CPAP  3. PVCs/Palpitations - In hospital was averaging about 8-10 PVCs per minute. May be contributing to LV dysfunction - Continue low dose amio 200 daily for suppression - ZioPatch AT done. NSR one 9-beat run NSVT. PVC 3.5%  4. Vertigo - start meclizine   Signed, Glori Bickers, MD  01/15/2019 3:32 PM  Advanced Heart Failure [place] [address] Heart and [place] [address] [phone] (office) [phone] (fax)

## 2019-01-15 NOTE — Progress Notes (Signed)
  Echocardiogram 2D Echocardiogram has been performed.  Bobbye Charleston 01/15/2019, 2:48 PM

## 2019-01-16 LAB — T3, FREE: T3, Free: 3.1 pg/mL (ref 2.0–4.4)

## 2019-01-19 ENCOUNTER — Other Ambulatory Visit (HOSPITAL_COMMUNITY)
Admission: RE | Admit: 2019-01-19 | Discharge: 2019-01-19 | Disposition: A | Discharge status: Home / Self Care | Payer: Medicaid Other | Source: Ambulatory Visit | Attending: Internal Medicine | Admitting: Internal Medicine

## 2019-01-19 DIAGNOSIS — Z20828 Contact with and (suspected) exposure to other viral communicable diseases: Secondary | ICD-10-CM | POA: Diagnosis not present

## 2019-01-19 DIAGNOSIS — Z01812 Encounter for preprocedural laboratory examination: Secondary | ICD-10-CM | POA: Diagnosis not present

## 2019-01-19 LAB — SARS CORONAVIRUS 2 (TAT 6-24 HRS): SARS Coronavirus 2: NEGATIVE

## 2019-01-20 NOTE — Progress Notes (Unsigned)
Virtual Visit via Video Note   This visit type was conducted due to national recommendations for restrictions regarding the COVID-19 Pandemic (e.g. social distancing) in an effort to limit this patient's exposure and mitigate transmission in our community.  Due to her co-morbid illnesses, this patient is at least at moderate risk for complications without adequate follow up.  This format is felt to be most appropriate for this patient at this time.  All issues noted in this document were discussed and addressed.  A limited physical exam was performed with this format.  Please refer to the patient's chart for her consent to telehealth for [facility].  Evaluation Performed:  Follow-up visit  This visit type was conducted due to national recommendations for restrictions regarding the COVID-19 Pandemic (e.g. social distancing).  This format is felt to be most appropriate for this patient at this time.  All issues noted in this document were discussed and addressed.  No physical exam was performed (except for noted visual exam findings with Video Visits).  Please refer to the patient's chart (MyChart message for video visits and phone note for telephone visits) for the patient's consent to telehealth for [facility].  Date:  01/20/2019   ID:  Tara Bradshaw, DOB 01-31-1986, MRN [id number]  Patient Location:  Home  Provider location:   [place]  PCP:  Kerin Perna, NP  Cardiologist:  Pierre Bali, MD Sleep Medicine:  Fransico Him, MD Electrophysiologist:  None   Chief Complaint:  OSA  History of Present Illness:    Tara Bradshaw who presents via audio/video conferencing for a telehealth visit today.    This is a 33yo AAF with a hx of chronic systolic CHF, HTN and GERD.  She was referred for sleep study showing moderate OSA with an AHI of 15.3/hr.  She was placed on auto CPAP and is now here for followup.  She is doing well with her CPAP device and thinks  that she has gotten used to it.  She tolerates the mask and feels the pressure is adequate.  Since going on CPAP she feels rested in the am and has no significant daytime sleepiness.  She denies any significant mouth or nasal dryness or nasal congestion.  She does not think that he snores.    The patient does not have symptoms concerning for COVID-19 infection (fever, chills, cough, or new shortness of breath).   Prior CV studies:   The following studies were reviewed today:  PAP compliance download  Past Medical History:  Diagnosis Date  . Anemia    CHRONIC  . GERD (gastroesophageal reflux disease)    WITH PREGNANCY  . Pneumonia    x 1   Past Surgical History:  Procedure Laterality Date  . DILATION AND EVACUATION N/A 11/30/2016   Procedure: DILATATION AND EVACUATION;  Surgeon: Donnamae Jude, MD;  Location: [facility];  Service: Gynecology;  Laterality: N/A;  . WISDOM TOOTH EXTRACTION     NO ANESTHESIA     No outpatient medications have been marked as taking for the 01/21/19 encounter (Appointment) with Sueanne Margarita, MD.     Allergies:   Patient has no known allergies.   Social History   Tobacco Use  . Smoking status: Never Smoker  . Smokeless tobacco: Never Used  Substance Use Topics  . Alcohol use: No  . Drug use: No     Family Hx: The patient's family history includes Asthma in her mother; Cancer in  her paternal grandmother; Cancer (age of onset: 70) in her maternal aunt; Epilepsy in her mother; Hypertension in her maternal grandmother.  ROS:   Please see the history of present illness.     All other systems reviewed and are negative.   Labs/Other Tests and Data Reviewed:    Recent Labs: 11/14/2018: Magnesium 2.0 12/18/2018: B Natriuretic Peptide 515.4 01/15/2019: ALT 19; BUN 8; Creatinine, Ser 0.66; Hemoglobin 12.5; Platelets 227; Potassium 3.9; Sodium 137; TSH 0.465   Recent Lipid Panel Lab Results  Component Value Date/Time   CHOL 212 (H) 06/04/2007 08:12  PM   TRIG 88 06/04/2007 08:12 PM   HDL 46 06/04/2007 08:12 PM   CHOLHDL 4.6 Ratio 06/04/2007 08:12 PM   LDLCALC 148 (H) 06/04/2007 08:12 PM    Wt Readings from Last 3 Encounters:  01/15/19 251 lb 3.2 oz (113.9 kg)  12/18/18 250 lb 12.8 oz (113.8 kg)  12/08/18 249 lb 3.2 oz (113 kg)     Objective:    Vital Signs:  There were no vitals taken for this visit.   CONSTITUTIONAL:  Well nourished, well developed Bradshaw in no acute distress.  EYES: anicteric MOUTH: oral mucosa is pink RESPIRATORY: Normal respiratory effort, symmetric expansion CARDIOVASCULAR: No peripheral edema SKIN: No rash, lesions or ulcers MUSCULOSKELETAL: no digital cyanosis NEURO: Cranial Nerves II-XII grossly intact, moves all extremities PSYCH: Intact judgement and insight.  A&O x 3, Mood/affect appropriate   ASSESSMENT & PLAN:    1.  OSA - The pathophysiology of obstructive sleep apnea , it's cardiovascular consequences & modes of treatment including CPAP were discused with the patient in detail & they evidenced understanding.  The patient is tolerating PAP therapy well without any problems. The PAP download was reviewed today and showed an AHI of ***/hr on *** cm H2O with ***% compliance in using more than 4 hours nightly.  The patient has been using and benefiting from PAP use and will continue to benefit from therapy.   2.  HTN -BP controlled -continue Carvedilol 3.125mg  BID, Bidil 20-37.5mg  BID and Entresto  3.  Obesity -I have encouraged her to get into a routine exercise program and cut back on carbs and portions.   COVID-19 Education: The signs and symptoms of COVID-19 were discussed with the patient and how to seek care for testing (follow up with PCP or arrange E-visit).  The importance of social distancing was discussed today.  Patient Risk:   After full review of this patient's clinical status, I feel that they are at least moderate risk at this time.  Time:   Today, I have spent 20 minutes  directly with the patient on telemedicine discussing medical problems including OSA< HTN, obesity.  We also reviewed the symptoms of COVID 19 and the ways to protect against contracting the virus with telehealth technology.  I spent an additional 5 minutes reviewing patient's chart including PAP compliance download and sleep study.  Medication Adjustments/Labs and Tests Ordered: Current medicines are reviewed at length with the patient today.  Concerns regarding medicines are outlined above.  Tests Ordered: No orders of the defined types were placed in this encounter.  Medication Changes: No orders of the defined types were placed in this encounter.   Disposition:  Follow up in 1 year(s)  Signed, Fransico Him, MD  01/20/2019 10:32 PM    [facility]

## 2019-01-21 ENCOUNTER — Other Ambulatory Visit: Payer: Self-pay

## 2019-01-21 ENCOUNTER — Telehealth: Payer: Medicaid Other | Admitting: Cardiology

## 2019-01-21 ENCOUNTER — Telehealth: Payer: Self-pay | Admitting: *Deleted

## 2019-01-21 DIAGNOSIS — G4733 Obstructive sleep apnea (adult) (pediatric): Secondary | ICD-10-CM

## 2019-01-21 DIAGNOSIS — I10 Essential (primary) hypertension: Secondary | ICD-10-CM

## 2019-01-21 DIAGNOSIS — E669 Obesity, unspecified: Secondary | ICD-10-CM

## 2019-01-21 NOTE — Telephone Encounter (Signed)
Pt is scheduled for virtual appointment today at 8:20 with Dr Radford Pax.  I have tried twice to reach her this morning.  Left message to call office.

## 2019-01-22 ENCOUNTER — Encounter (HOSPITAL_COMMUNITY)
Admission: RE | Disposition: A | Discharge status: Home / Self Care | Payer: Self-pay | Source: Home / Self Care | Attending: Internal Medicine

## 2019-01-22 ENCOUNTER — Other Ambulatory Visit: Payer: Self-pay

## 2019-01-22 ENCOUNTER — Ambulatory Visit (HOSPITAL_COMMUNITY)
Admission: RE | Admit: 2019-01-22 | Discharge: 2019-01-22 | Disposition: A | Discharge status: Home / Self Care | Payer: Medicaid Other | Attending: Internal Medicine | Admitting: Internal Medicine

## 2019-01-22 DIAGNOSIS — I110 Hypertensive heart disease with heart failure: Secondary | ICD-10-CM | POA: Diagnosis not present

## 2019-01-22 DIAGNOSIS — K219 Gastro-esophageal reflux disease without esophagitis: Secondary | ICD-10-CM | POA: Diagnosis not present

## 2019-01-22 DIAGNOSIS — R42 Dizziness and giddiness: Secondary | ICD-10-CM | POA: Insufficient documentation

## 2019-01-22 DIAGNOSIS — Z8249 Family history of ischemic heart disease and other diseases of the circulatory system: Secondary | ICD-10-CM | POA: Insufficient documentation

## 2019-01-22 DIAGNOSIS — I428 Other cardiomyopathies: Secondary | ICD-10-CM | POA: Insufficient documentation

## 2019-01-22 DIAGNOSIS — G4733 Obstructive sleep apnea (adult) (pediatric): Secondary | ICD-10-CM | POA: Insufficient documentation

## 2019-01-22 DIAGNOSIS — I5022 Chronic systolic (congestive) heart failure: Secondary | ICD-10-CM

## 2019-01-22 DIAGNOSIS — I5023 Acute on chronic systolic (congestive) heart failure: Secondary | ICD-10-CM | POA: Insufficient documentation

## 2019-01-22 DIAGNOSIS — Z79899 Other long term (current) drug therapy: Secondary | ICD-10-CM | POA: Insufficient documentation

## 2019-01-22 HISTORY — PX: RIGHT/LEFT HEART CATH AND CORONARY ANGIOGRAPHY: CATH118266

## 2019-01-22 LAB — POCT I-STAT EG7
Acid-base deficit: 3 mmol/L — ABNORMAL HIGH (ref 0.0–2.0)
Acid-base deficit: 4 mmol/L — ABNORMAL HIGH (ref 0.0–2.0)
Acid-base deficit: 5 mmol/L — ABNORMAL HIGH (ref 0.0–2.0)
Bicarbonate: 22.6 mmol/L (ref 20.0–28.0)
Bicarbonate: 20.7 mmol/L (ref 20.0–28.0)
Bicarbonate: 22.2 mmol/L (ref 20.0–28.0)
Calcium, Ion: 1.26 mmol/L (ref 1.15–1.40)
Calcium, Ion: 1 mmol/L — ABNORMAL LOW (ref 1.15–1.40)
Calcium, Ion: 1.11 mmol/L — ABNORMAL LOW (ref 1.15–1.40)
HCT: 35 % — ABNORMAL LOW (ref 36.0–46.0)
HCT: 32 % — ABNORMAL LOW (ref 36.0–46.0)
HCT: 34 % — ABNORMAL LOW (ref 36.0–46.0)
Hemoglobin: 11.9 g/dL — ABNORMAL LOW (ref 12.0–15.0)
Hemoglobin: 10.9 g/dL — ABNORMAL LOW (ref 12.0–15.0)
Hemoglobin: 11.6 g/dL — ABNORMAL LOW (ref 12.0–15.0)
O2 Saturation: 77 %
O2 Saturation: 79 %
O2 Saturation: 82 %
Potassium: 3.3 mmol/L — ABNORMAL LOW (ref 3.5–5.1)
Potassium: 2.9 mmol/L — ABNORMAL LOW (ref 3.5–5.1)
Potassium: 3.2 mmol/L — ABNORMAL LOW (ref 3.5–5.1)
Sodium: 143 mmol/L (ref 135–145)
Sodium: 144 mmol/L (ref 135–145)
Sodium: 145 mmol/L (ref 135–145)
TCO2: 24 mmol/L (ref 22–32)
TCO2: 22 mmol/L (ref 22–32)
TCO2: 23 mmol/L (ref 22–32)
pCO2, Ven: 42.4 mmHg — ABNORMAL LOW (ref 44.0–60.0)
pCO2, Ven: 39.4 mmHg — ABNORMAL LOW (ref 44.0–60.0)
pCO2, Ven: 42.3 mmHg — ABNORMAL LOW (ref 44.0–60.0)
pH, Ven: 7.336 (ref 7.250–7.430)
pH, Ven: 7.327 (ref 7.250–7.430)
pH, Ven: 7.33 (ref 7.250–7.430)
pO2, Ven: 44 mmHg (ref 32.0–45.0)
pO2, Ven: 47 mmHg — ABNORMAL HIGH (ref 32.0–45.0)
pO2, Ven: 50 mmHg — ABNORMAL HIGH (ref 32.0–45.0)

## 2019-01-22 LAB — PREGNANCY, URINE: Preg Test, Ur: NEGATIVE

## 2019-01-22 LAB — POCT I-STAT 7, (LYTES, BLD GAS, ICA,H+H)
Acid-base deficit: 2 mmol/L (ref 0.0–2.0)
Bicarbonate: 23.4 mmol/L (ref 20.0–28.0)
Calcium, Ion: 1.23 mmol/L (ref 1.15–1.40)
HCT: 36 % (ref 36.0–46.0)
Hemoglobin: 12.2 g/dL (ref 12.0–15.0)
O2 Saturation: 99 %
Potassium: 3.4 mmol/L — ABNORMAL LOW (ref 3.5–5.1)
Sodium: 142 mmol/L (ref 135–145)
TCO2: 25 mmol/L (ref 22–32)
pCO2 arterial: 41.6 mmHg (ref 32.0–48.0)
pH, Arterial: 7.358 (ref 7.350–7.450)
pO2, Arterial: 131 mmHg — ABNORMAL HIGH (ref 83.0–108.0)

## 2019-01-22 SURGERY — RIGHT/LEFT HEART CATH AND CORONARY ANGIOGRAPHY
Anesthesia: LOCAL

## 2019-01-22 MED ORDER — HEPARIN (PORCINE) IN NACL 1000-0.9 UT/500ML-% IV SOLN
INTRAVENOUS | Status: AC
  Filled 2019-01-22: qty 1000

## 2019-01-22 MED ORDER — LIDOCAINE HCL (PF) 1 % IJ SOLN
INTRAMUSCULAR | Status: DC | PRN
  Administered 2019-01-22: 2 mL
  Administered 2019-01-22: 4 mL

## 2019-01-22 MED ORDER — SODIUM CHLORIDE 0.9% FLUSH: 3.0000 mL | Freq: Two times a day (BID) | INTRAVENOUS | Status: DC

## 2019-01-22 MED ORDER — LABETALOL HCL 5 MG/ML IV SOLN: 10.0000 mg | INTRAVENOUS | Status: DC | PRN

## 2019-01-22 MED ORDER — VERAPAMIL HCL 2.5 MG/ML IV SOLN
INTRAVENOUS | Status: DC | PRN
  Administered 2019-01-22: 10 mL via INTRA_ARTERIAL

## 2019-01-22 MED ORDER — ONDANSETRON HCL 4 MG/2ML IJ SOLN: 4.0000 mg | Freq: Four times a day (QID) | INTRAMUSCULAR | Status: DC | PRN

## 2019-01-22 MED ORDER — HEPARIN SODIUM (PORCINE) 1000 UNIT/ML IJ SOLN
INTRAMUSCULAR | Status: AC
  Filled 2019-01-22: qty 1

## 2019-01-22 MED ORDER — MIDAZOLAM HCL 2 MG/2ML IJ SOLN
INTRAMUSCULAR | Status: AC
  Filled 2019-01-22: qty 2

## 2019-01-22 MED ORDER — MIDAZOLAM HCL 2 MG/2ML IJ SOLN
INTRAMUSCULAR | Status: DC | PRN
  Administered 2019-01-22: 2 mg via INTRAVENOUS

## 2019-01-22 MED ORDER — HEPARIN (PORCINE) IN NACL 1000-0.9 UT/500ML-% IV SOLN
INTRAVENOUS | Status: DC | PRN
  Administered 2019-01-22 (×2): 500 mL

## 2019-01-22 MED ORDER — SODIUM CHLORIDE 0.9% FLUSH: 3.0000 mL | INTRAVENOUS | Status: DC | PRN

## 2019-01-22 MED ORDER — ACETAMINOPHEN 325 MG PO TABS: 650.0000 mg | ORAL_TABLET | ORAL | Status: DC | PRN

## 2019-01-22 MED ORDER — VERAPAMIL HCL 2.5 MG/ML IV SOLN
INTRAVENOUS | Status: AC
  Filled 2019-01-22: qty 2

## 2019-01-22 MED ORDER — SODIUM CHLORIDE 0.9 % IV SOLN
INTRAVENOUS | Status: DC
  Administered 2019-01-22: 08:00:00 via INTRAVENOUS

## 2019-01-22 MED ORDER — HYDRALAZINE HCL 20 MG/ML IJ SOLN: 10.0000 mg | INTRAMUSCULAR | Status: DC | PRN

## 2019-01-22 MED ORDER — LIDOCAINE HCL (PF) 1 % IJ SOLN
INTRAMUSCULAR | Status: AC
  Filled 2019-01-22: qty 30

## 2019-01-22 MED ORDER — SODIUM CHLORIDE 0.9 % IV SOLN: 250.0000 mL | INTRAVENOUS | Status: DC | PRN

## 2019-01-22 MED ORDER — ASPIRIN 81 MG PO CHEW
81.0000 mg | CHEWABLE_TABLET | ORAL | Status: AC
  Administered 2019-01-22: 81 mg via ORAL
  Filled 2019-01-22: qty 1

## 2019-01-22 MED ORDER — HEPARIN SODIUM (PORCINE) 1000 UNIT/ML IJ SOLN
INTRAMUSCULAR | Status: DC | PRN
  Administered 2019-01-22: 5000 [IU] via INTRAVENOUS

## 2019-01-22 MED ORDER — FENTANYL CITRATE (PF) 100 MCG/2ML IJ SOLN
INTRAMUSCULAR | Status: DC | PRN
  Administered 2019-01-22: 25 ug via INTRAVENOUS

## 2019-01-22 MED ORDER — IOHEXOL 350 MG/ML SOLN
INTRAVENOUS | Status: DC | PRN
  Administered 2019-01-22: 35 mL

## 2019-01-22 MED ORDER — FENTANYL CITRATE (PF) 100 MCG/2ML IJ SOLN
INTRAMUSCULAR | Status: AC
  Filled 2019-01-22: qty 2

## 2019-01-22 SURGICAL SUPPLY — 12 items
CATH 5FR JL3.5 JR4 ANG PIG MP (CATHETERS) ×1 IMPLANT
CATH BALLN WEDGE 5F 110CM (CATHETERS) ×1 IMPLANT
DEVICE RAD COMP TR BAND LRG (VASCULAR PRODUCTS) ×1 IMPLANT
GLIDESHEATH SLEND SS 6F .021 (SHEATH) ×1 IMPLANT
GUIDEWIRE .025 260CM (WIRE) ×1 IMPLANT
GUIDEWIRE INQWIRE 1.5J.035X260 (WIRE) IMPLANT
INQWIRE 1.5J .035X260CM (WIRE) ×2
KIT HEART LEFT (KITS) ×1 IMPLANT
PACK CARDIAC CATHETERIZATION (CUSTOM PROCEDURE TRAY) ×2 IMPLANT
SHEATH GLIDE SLENDER 4/5FR (SHEATH) ×1 IMPLANT
TRANSDUCER W/STOPCOCK (MISCELLANEOUS) ×2 IMPLANT
TUBING CIL FLEX 10 FLL-RA (TUBING) ×1 IMPLANT

## 2019-01-22 NOTE — Interval H&P Note (Signed)
History and Physical Interval Note:  01/22/2019 8:33 AM  Tara Bradshaw  has presented today for surgery, with the diagnosis of heart failure.  The various methods of treatment have been discussed with the patient and family. After consideration of risks, benefits and other options for treatment, the patient has consented to  Procedure(s): RIGHT/LEFT HEART CATH AND CORONARY ANGIOGRAPHY (N/A) and possible coronary angioplasty as a surgical intervention.  The patient's history has been reviewed, patient examined, no change in status, stable for surgery.  I have reviewed the patient's chart and labs.  Questions were answered to the patient's satisfaction.     Daniel Bensimhon

## 2019-01-22 NOTE — Progress Notes (Signed)
VAST consulted to place 20G IV in [facility]. Pt gone from P16 upon arrival.

## 2019-01-22 NOTE — Discharge Instructions (Signed)
Radial Site Care ° °This sheet gives you information about how to care for yourself after your procedure. Your health care provider may also give you more specific instructions. If you have problems or questions, contact your health care provider. °What can I expect after the procedure? °After the procedure, it is common to have: °· Bruising and tenderness at the catheter insertion area. °Follow these instructions at home: °Medicines °· Take over-the-counter and prescription medicines only as told by your health care provider. °Insertion site care °· Follow instructions from your health care provider about how to take care of your insertion site. Make sure you: °? Wash your hands with soap and water before you change your bandage (dressing). If soap and water are not available, use hand sanitizer. °? Change your dressing as told by your health care provider. °? Leave stitches (sutures), skin glue, or adhesive strips in place. These skin closures may need to stay in place for 2 weeks or longer. If adhesive strip edges start to loosen and curl up, you may trim the loose edges. Do not remove adhesive strips completely unless your health care provider tells you to do that. °· Check your insertion site every day for signs of infection. Check for: °? Redness, swelling, or pain. °? Fluid or blood. °? Pus or a bad smell. °? Warmth. °· Do not take baths, swim, or use a hot tub until your health care provider approves. °· You may shower 24-48 hours after the procedure, or as directed by your health care provider. °? Remove the dressing and gently wash the site with plain soap and water. °? Pat the area dry with a clean towel. °? Do not rub the site. That could cause bleeding. °· Do not apply powder or lotion to the site. °Activity ° °· For 24 hours after the procedure, or as directed by your health care provider: °? Do not flex or bend the affected arm. °? Do not push or pull heavy objects with the affected arm. °? Do not  drive yourself home from the hospital or clinic. You may drive 24 hours after the procedure unless your health care provider tells you not to. °? Do not operate machinery or power tools. °· Do not lift anything that is heavier than 10 lb (4.5 kg), or the limit that you are told, until your health care provider says that it is safe. °· Ask your health care provider when it is okay to: °? Return to work or school. °? Resume usual physical activities or sports. °? Resume sexual activity. °General instructions °· If the catheter site starts to bleed, raise your arm and put firm pressure on the site. If the bleeding does not stop, get help right away. This is a medical emergency. °· If you went home on the same day as your procedure, a responsible adult should be with you for the first 24 hours after you arrive home. °· Keep all follow-up visits as told by your health care provider. This is important. °Contact a health care provider if: °· You have a fever. °· You have redness, swelling, or yellow drainage around your insertion site. °Get help right away if: °· You have unusual pain at the radial site. °· The catheter insertion area swells very fast. °· The insertion area is bleeding, and the bleeding does not stop when you hold steady pressure on the area. °· Your arm or hand becomes pale, cool, tingly, or numb. °These symptoms may represent a serious problem   that is an emergency. Do not wait to see if the symptoms will go away. Get medical help right away. Call your local emergency services (911 in the [place] Do not drive yourself to the hospital. °Summary °· After the procedure, it is common to have bruising and tenderness at the site. °· Follow instructions from your health care provider about how to take care of your radial site wound. Check the wound every day for signs of infection. °· Do not lift anything that is heavier than 10 lb (4.5 kg), or the limit that you are told, until your health care provider says  that it is safe. °This information is not intended to replace advice given to you by your health care provider. Make sure you discuss any questions you have with your health care provider. °Document Released: 04/28/2010 Document Revised: 05/01/2017 Document Reviewed: 05/01/2017 °Elsevier Patient Education © 2020 [employer] ° °

## 2019-01-23 ENCOUNTER — Encounter (HOSPITAL_COMMUNITY): Payer: Self-pay | Admitting: Internal Medicine

## 2019-01-28 ENCOUNTER — Telehealth (HOSPITAL_COMMUNITY): Payer: Self-pay | Admitting: *Deleted

## 2019-01-28 DIAGNOSIS — I5022 Chronic systolic (congestive) heart failure: Secondary | ICD-10-CM

## 2019-01-28 NOTE — Telephone Encounter (Signed)
Have her come for radial artery u/s to evaluate please.

## 2019-01-28 NOTE — Telephone Encounter (Signed)
Pt left vm c/o pain from elbow into her hand. She describes pain as " sharp/throbbing" pain. Pt would like to know if this is normal post cath.     Routed to [place] for advice

## 2019-01-29 NOTE — Telephone Encounter (Signed)
VAS [place] Upper Extremity ordered per Dr.Bensimhon request.   Arbutus Leas will you please contact patient to schedule an appointment for this.   She has an open schedule fyi

## 2019-01-29 NOTE — Telephone Encounter (Signed)
Spoke with patient and she is agreeable to having this done. Will work on ordering this today

## 2019-02-02 ENCOUNTER — Institutional Professional Consult (permissible substitution): Payer: Medicaid Other | Admitting: Internal Medicine

## 2019-02-04 ENCOUNTER — Encounter: Payer: Self-pay | Admitting: Internal Medicine

## 2019-02-04 ENCOUNTER — Telehealth: Payer: Self-pay

## 2019-02-04 ENCOUNTER — Other Ambulatory Visit: Payer: Self-pay

## 2019-02-04 ENCOUNTER — Telehealth (INDEPENDENT_AMBULATORY_CARE_PROVIDER_SITE_OTHER): Payer: Medicaid Other | Admitting: Internal Medicine

## 2019-02-04 VITALS — Ht 61.0 in | Wt 251.0 lb

## 2019-02-04 DIAGNOSIS — I5022 Chronic systolic (congestive) heart failure: Secondary | ICD-10-CM

## 2019-02-04 DIAGNOSIS — I493 Ventricular premature depolarization: Secondary | ICD-10-CM | POA: Diagnosis not present

## 2019-02-04 DIAGNOSIS — I428 Other cardiomyopathies: Secondary | ICD-10-CM | POA: Diagnosis not present

## 2019-02-04 NOTE — Progress Notes (Signed)
Electrophysiology TeleHealth Note   Due to national recommendations of social distancing due to [place] 19, Audio  telehealth visit is felt to be most appropriate for this patient at this time.  Verbal consent was obtained for the visit today   Date:  02/04/2019   ID:  Tara Bradshaw, DOB [birth year], MRN [id number]  Location: home Provider location: [place] Evaluation Performed: New patient consult  PCP:  Kerin Perna, NP  Cardiologist:  Dr Haroldine Laws Electrophysiologist:  None   Chief Complaint:  CHF  History of Present Illness:    Tara Bradshaw is a 33 y.o. female who presents via audio conferencing for a telehealth visit today.   The patient is referred for new consultation regarding risk stratification of sudden death by Dr Haroldine Laws.  She was initially diagnosed with CHF in March 2020 after presenting with CHF symptoms.  She has marked SOB and orthopnea at that time.  Echo revealed EF 20%.  She has been treated with optimal medical therapy since that time, without resolutions in her CM.  She has worn a Ziopath which documented sinus with PVCs and NSVT.  She is felt to likely have a post-partem CM.  There was concern that she could have PVC cardiomyopathy and she was placed on amiodarone, though Zio reveals PVC burden is only 3.5% LHC/RHC- no CAD, EF 20%, well compensated hemodynamics with high cardiac output. She continues to be quite limited with SOB.  She has fatigue and markedly reduced exercise tolerance.  +orthopnea.   She has heart "fluttering".  + dizziness Today, she denies symptoms of chest pain,  lower extremity edema, claudication,  presyncope, syncope, or neurologic sequela. The patient is tolerating medications without difficulties and is otherwise without complaint today.   she denies symptoms of cough, fevers, chills, or new SOB worrisome for COVID 19.   Past Medical History:  Diagnosis Date   Anemia    CHRONIC   Chronic systolic dysfunction of  left ventricle    GERD (gastroesophageal reflux disease)    WITH PREGNANCY   Nonischemic cardiomyopathy (HCC)    Pneumonia    x 1   PVC's (premature ventricular contractions)     Past Surgical History:  Procedure Laterality Date   DILATION AND EVACUATION N/A 11/30/2016   Procedure: DILATATION AND EVACUATION;  Surgeon: Donnamae Jude, MD;  Location: [facility];  Service: Gynecology;  Laterality: N/A;   RIGHT/LEFT HEART CATH AND CORONARY ANGIOGRAPHY N/A 01/22/2019   Procedure: RIGHT/LEFT HEART CATH AND CORONARY ANGIOGRAPHY;  Surgeon: Jolaine Artist, MD;  Location: [facility];  Service: Cardiovascular;  Laterality: N/A;   WISDOM TOOTH EXTRACTION     NO ANESTHESIA    Current Outpatient Medications  Medication Sig Dispense Refill   amiodarone (PACERONE) 200 MG tablet Take 1 tablet (200 mg total) by mouth daily. 90 tablet 0   carvedilol (COREG) 3.125 MG tablet Take 1 tablet (3.125 mg total) by mouth 2 (two) times daily. 180 tablet 3   digoxin (LANOXIN) 0.125 MG tablet Take 1 tablet (0.125 mg total) by mouth daily. 90 tablet 0   furosemide (LASIX) 40 MG tablet Take 1 tablet (40 mg total) by mouth 2 (two) times daily. (Patient taking differently: Take 40 mg by mouth daily. ) 60 tablet 6   isosorbide-hydrALAZINE (BIDIL) 20-37.5 MG tablet Take 1 tablet by mouth 3 (three) times daily. (Patient taking differently: Take 1 tablet by mouth daily. ) 90 tablet 6   levonorgestrel (MIRENA) 20 MCG/24HR IUD 1  each by Intrauterine route once.     meclizine (ANTIVERT) 25 MG tablet Take 1 tablet (25 mg total) by mouth 3 (three) times daily as needed for dizziness. 15 tablet 0   potassium chloride SA (K-DUR) 20 MEQ tablet Take 1 tablet (20 mEq total) by mouth daily. 90 tablet 0   sacubitril-valsartan (ENTRESTO) 49-51 MG Take 1 tablet by mouth 2 (two) times daily.     sacubitril-valsartan (ENTRESTO) 97-103 MG Take 1 tablet by mouth 2 (two) times daily. 60 tablet 6   spironolactone  (ALDACTONE) 25 MG tablet Take 1 tablet (25 mg total) by mouth daily. 30 tablet 6   No current facility-administered medications for this visit.     Allergies:   Patient has no known allergies.   Social History:  The patient  reports that she has never smoked. She has never used smokeless tobacco. She reports that she does not drink alcohol or use drugs.   Family History:  The patient's family history includes Asthma in her mother; Cancer in her paternal grandmother; Cancer (age of onset: 42) in her maternal aunt; Epilepsy in her mother; Hypertension in her maternal grandmother.    ROS:  Please see the history of present illness.   All other systems are personally reviewed and negative.    Exam:    Vital Signs:  Ht 5\' 1"  (1.549 m)    Wt 251 lb (113.9 kg)    BMI 47.43 kg/m   Well sounding, alert and conversant   Labs/Other Tests and Data Reviewed:    Recent Labs: 11/14/2018: Magnesium 2.0 12/18/2018: B Natriuretic Peptide 515.4 01/15/2019: ALT 19; BUN 8; Creatinine, Ser 0.66; Platelets 227; TSH 0.465 01/22/2019: Hemoglobin 11.6; Potassium 3.2; Sodium 144   Wt Readings from Last 3 Encounters:  02/04/19 251 lb (113.9 kg)  01/22/19 251 lb (113.9 kg)  01/15/19 251 lb 3.2 oz (113.9 kg)     Other studies personally reviewed: Additional studies/ records that were reviewed today include: Dr Gillermina Hu notes, recent echo/ cath/ labs  Review of the above records today demonstrates: as above  ASSESSMENT & PLAN:    1.  Nonischemic CM The patient has a nonischemic CM (EF20% by cath), NYHA Class III CHF, and PVCs.  He is referred by Dr Haroldine Laws for risk stratification of sudden death and consideration of ICD implantation.  At this time, she meets SCD-HeFT criteria for ICD implantation for primary prevention of sudden death.  I have had a thorough discussion with the patient reviewing options.  The patient has had opportunities to ask questions and have them answered.  Risks, benefits,  alternatives to ICD implantation were discussed in detail with the patient today. The patient understands that the risks include but are not limited to bleeding, infection, pneumothorax, perforation, tamponade, vascular damage, renal failure, MI, stroke, death, inappropriate shocks, and lead dislodgement and wishes to discuss this further with her husband.  I will have Sonia Baller follow-up with her regarding her decision.  Given narrow QRS, she does not meet criteria for CRT.  She wishes to feel better.  I will reach out to Dr Haroldine Laws to see if she is a candidate for [employer] also  2. PVCs Appear to be outflow tract in nature by prior ekgs I do not feel that PVC burden is the cause of her cardiomyopathy.  She does have frequent "fluttering".  Continue amiodarone for now, though hopefully this can be discontinued long term.    Current medicines are reviewed at length with the patient  today.   The patient does not have concerns regarding her medicines.  The following changes were made today:  none  Labs/ tests ordered today include:  No orders of the defined types were placed in this encounter.   Patient Risk:  after full review of this patients clinical status, I feel that they are at moderate risk at this time.   Today, I have spent 20 minutes with the patient with telehealth technology discussing risk stratification of sudden death .    Signed, Thompson Grayer MD, [facility] 02/04/2019 11:10 AM   [facility] [address] [place] [address] [phone] (office) [phone] (fax)

## 2019-02-04 NOTE — H&P (View-Only) (Signed)
Electrophysiology TeleHealth Note   Due to national recommendations of social distancing due to [place] 19, Audio  telehealth visit is felt to be most appropriate for this patient at this time.  Verbal consent was obtained for the visit today   Date:  02/04/2019   ID:  Tara Bradshaw, DOB 16-Jul-1985, MRN [id number]  Location: home Provider location: [place] Evaluation Performed: New patient consult  PCP:  Tara Perna, NP  Cardiologist:  Dr Haroldine Laws Electrophysiologist:  None   Chief Complaint:  CHF  History of Present Illness:    Tara Bradshaw is a 33 y.o. female who presents via audio conferencing for a telehealth visit today.   The patient is referred for new consultation regarding risk stratification of sudden death by Dr Haroldine Laws.  She was initially diagnosed with CHF in March 2020 after presenting with CHF symptoms.  She has marked SOB and orthopnea at that time.  Echo revealed EF 20%.  She has been treated with optimal medical therapy since that time, without resolutions in her CM.  She has worn a Ziopath which documented sinus with PVCs and NSVT.  She is felt to likely have a post-partem CM.  There was concern that she could have PVC cardiomyopathy and she was placed on amiodarone, though Zio reveals PVC burden is only 3.5% LHC/RHC- no CAD, EF 20%, well compensated hemodynamics with high cardiac output. She continues to be quite limited with SOB.  She has fatigue and markedly reduced exercise tolerance.  +orthopnea.   She has heart "fluttering".  + dizziness Today, she denies symptoms of chest pain,  lower extremity edema, claudication,  presyncope, syncope, or neurologic sequela. The patient is tolerating medications without difficulties and is otherwise without complaint today.   she denies symptoms of cough, fevers, chills, or new SOB worrisome for COVID 19.   Past Medical History:  Diagnosis Date   Anemia    CHRONIC   Chronic systolic dysfunction of  left ventricle    GERD (gastroesophageal reflux disease)    WITH PREGNANCY   Nonischemic cardiomyopathy (HCC)    Pneumonia    x 1   PVC's (premature ventricular contractions)     Past Surgical History:  Procedure Laterality Date   DILATION AND EVACUATION N/A 11/30/2016   Procedure: DILATATION AND EVACUATION;  Surgeon: Donnamae Jude, MD;  Location: [facility];  Service: Gynecology;  Laterality: N/A;   RIGHT/LEFT HEART CATH AND CORONARY ANGIOGRAPHY N/A 01/22/2019   Procedure: RIGHT/LEFT HEART CATH AND CORONARY ANGIOGRAPHY;  Surgeon: Jolaine Artist, MD;  Location: [facility];  Service: Cardiovascular;  Laterality: N/A;   WISDOM TOOTH EXTRACTION     NO ANESTHESIA    Current Outpatient Medications  Medication Sig Dispense Refill   amiodarone (PACERONE) 200 MG tablet Take 1 tablet (200 mg total) by mouth daily. 90 tablet 0   carvedilol (COREG) 3.125 MG tablet Take 1 tablet (3.125 mg total) by mouth 2 (two) times daily. 180 tablet 3   digoxin (LANOXIN) 0.125 MG tablet Take 1 tablet (0.125 mg total) by mouth daily. 90 tablet 0   furosemide (LASIX) 40 MG tablet Take 1 tablet (40 mg total) by mouth 2 (two) times daily. (Patient taking differently: Take 40 mg by mouth daily. ) 60 tablet 6   isosorbide-hydrALAZINE (BIDIL) 20-37.5 MG tablet Take 1 tablet by mouth 3 (three) times daily. (Patient taking differently: Take 1 tablet by mouth daily. ) 90 tablet 6   levonorgestrel (MIRENA) 20 MCG/24HR IUD 1  each by Intrauterine route once.     meclizine (ANTIVERT) 25 MG tablet Take 1 tablet (25 mg total) by mouth 3 (three) times daily as needed for dizziness. 15 tablet 0   potassium chloride SA (K-DUR) 20 MEQ tablet Take 1 tablet (20 mEq total) by mouth daily. 90 tablet 0   sacubitril-valsartan (ENTRESTO) 49-51 MG Take 1 tablet by mouth 2 (two) times daily.     sacubitril-valsartan (ENTRESTO) 97-103 MG Take 1 tablet by mouth 2 (two) times daily. 60 tablet 6   spironolactone  (ALDACTONE) 25 MG tablet Take 1 tablet (25 mg total) by mouth daily. 30 tablet 6   No current facility-administered medications for this visit.     Allergies:   Patient has no known allergies.   Social History:  The patient  reports that she has never smoked. She has never used smokeless tobacco. She reports that she does not drink alcohol or use drugs.   Family History:  The patient's family history includes Asthma in her mother; Cancer in her paternal grandmother; Cancer (age of onset: 39) in her maternal aunt; Epilepsy in her mother; Hypertension in her maternal grandmother.    ROS:  Please see the history of present illness.   All other systems are personally reviewed and negative.    Exam:    Vital Signs:  Ht 5\' 1"  (1.549 m)    Wt 251 lb (113.9 kg)    BMI 47.43 kg/m   Well sounding, alert and conversant   Labs/Other Tests and Data Reviewed:    Recent Labs: 11/14/2018: Magnesium 2.0 12/18/2018: B Natriuretic Peptide 515.4 01/15/2019: ALT 19; BUN 8; Creatinine, Ser 0.66; Platelets 227; TSH 0.465 01/22/2019: Hemoglobin 11.6; Potassium 3.2; Sodium 144   Wt Readings from Last 3 Encounters:  02/04/19 251 lb (113.9 kg)  01/22/19 251 lb (113.9 kg)  01/15/19 251 lb 3.2 oz (113.9 kg)     Other studies personally reviewed: Additional studies/ records that were reviewed today include: Dr Gillermina Hu notes, recent echo/ cath/ labs  Review of the above records today demonstrates: as above  ASSESSMENT & PLAN:    1.  Nonischemic CM The patient has a nonischemic CM (EF20% by cath), NYHA Class III CHF, and PVCs.  He is referred by Dr Haroldine Laws for risk stratification of sudden death and consideration of ICD implantation.  At this time, she meets SCD-HeFT criteria for ICD implantation for primary prevention of sudden death.  I have had a thorough discussion with the patient reviewing options.  The patient has had opportunities to ask questions and have them answered.  Risks, benefits,  alternatives to ICD implantation were discussed in detail with the patient today. The patient understands that the risks include but are not limited to bleeding, infection, pneumothorax, perforation, tamponade, vascular damage, renal failure, MI, stroke, death, inappropriate shocks, and lead dislodgement and wishes to discuss this further with her husband.  I will have Sonia Baller follow-up with her regarding her decision.  Given narrow QRS, she does not meet criteria for CRT.  She wishes to feel better.  I will reach out to Dr Haroldine Laws to see if she is a candidate for [employer] also  2. PVCs Appear to be outflow tract in nature by prior ekgs I do not feel that PVC burden is the cause of her cardiomyopathy.  She does have frequent "fluttering".  Continue amiodarone for now, though hopefully this can be discontinued long term.    Current medicines are reviewed at length with the patient  today.   The patient does not have concerns regarding her medicines.  The following changes were made today:  none  Labs/ tests ordered today include:  No orders of the defined types were placed in this encounter.   Patient Risk:  after full review of this patients clinical status, I feel that they are at moderate risk at this time.   Today, I have spent 20 minutes with the patient with telehealth technology discussing risk stratification of sudden death .    Signed, Thompson Grayer MD, [facility] 02/04/2019 11:10 AM   [facility] [address] [place] [address] [phone] (office) [phone] (fax)

## 2019-02-04 NOTE — Telephone Encounter (Signed)
-----   Message from Thompson Grayer, MD sent at 02/04/2019 11:09 AM EDT ----- She wishes to think about ICD and discuss with her husband.  Please call her tomorrow.  She will know then if she wants to proceed or not.

## 2019-02-06 ENCOUNTER — Other Ambulatory Visit: Payer: Self-pay

## 2019-02-06 ENCOUNTER — Ambulatory Visit (HOSPITAL_COMMUNITY)
Admission: RE | Admit: 2019-02-06 | Discharge: 2019-02-06 | Disposition: A | Discharge status: Home / Self Care | Payer: Medicaid Other | Source: Ambulatory Visit | Attending: Internal Medicine | Admitting: Internal Medicine

## 2019-02-06 DIAGNOSIS — I5022 Chronic systolic (congestive) heart failure: Secondary | ICD-10-CM

## 2019-02-06 NOTE — Progress Notes (Signed)
Right radial artery surveillance has been completed. Preliminary results can be found in CV Proc through chart review.   02/06/19 1:55 PM Tara Bradshaw RVT

## 2019-02-06 NOTE — Telephone Encounter (Signed)
Scheduled for February 19, 2019 at 12:30 pm  Pt aware

## 2019-02-10 ENCOUNTER — Other Ambulatory Visit: Payer: Self-pay

## 2019-02-10 ENCOUNTER — Telehealth: Payer: Self-pay | Admitting: Internal Medicine

## 2019-02-10 ENCOUNTER — Other Ambulatory Visit: Payer: Medicaid Other

## 2019-02-10 DIAGNOSIS — I5022 Chronic systolic (congestive) heart failure: Secondary | ICD-10-CM

## 2019-02-10 LAB — CBC WITH DIFFERENTIAL/PLATELET
Basophils Absolute: 0 10*3/uL (ref 0.0–0.2)
Basos: 0 %
EOS (ABSOLUTE): 0.2 10*3/uL (ref 0.0–0.4)
Eos: 3 %
Hematocrit: 36.4 % (ref 34.0–46.6)
Hemoglobin: 11.8 g/dL (ref 11.1–15.9)
Immature Grans (Abs): 0 10*3/uL (ref 0.0–0.1)
Immature Granulocytes: 0 %
Lymphocytes Absolute: 2.8 10*3/uL (ref 0.7–3.1)
Lymphs: 45 %
MCH: 26.1 pg — ABNORMAL LOW (ref 26.6–33.0)
MCHC: 32.4 g/dL (ref 31.5–35.7)
MCV: 81 fL (ref 79–97)
Monocytes Absolute: 0.4 10*3/uL (ref 0.1–0.9)
Monocytes: 7 %
Neutrophils Absolute: 2.7 10*3/uL (ref 1.4–7.0)
Neutrophils: 45 %
Platelets: 224 10*3/uL (ref 150–450)
RBC: 4.52 x10E6/uL (ref 3.77–5.28)
RDW: 13.6 % (ref 11.7–15.4)
WBC: 6 10*3/uL (ref 3.4–10.8)

## 2019-02-10 LAB — BASIC METABOLIC PANEL
BUN/Creatinine Ratio: 11 (ref 9–23)
BUN: 7 mg/dL (ref 6–20)
CO2: 21 mmol/L (ref 20–29)
Calcium: 9.6 mg/dL (ref 8.7–10.2)
Chloride: 104 mmol/L (ref 96–106)
Creatinine, Ser: 0.63 mg/dL (ref 0.57–1.00)
GFR calc Af Amer: 134 mL/min/{1.73_m2} (ref >59)
GFR calc non Af Amer: 117 mL/min/{1.73_m2} (ref >59)
Glucose: 98 mg/dL (ref 65–99)
Potassium: 4.2 mmol/L (ref 3.5–5.2)
Sodium: 138 mmol/L (ref 134–144)

## 2019-02-10 NOTE — Telephone Encounter (Signed)
Pt to come to office today to pick up instruction letter and get lab work  Letter created for Pt husband per request to give to his employer.  Work up complete.

## 2019-02-10 NOTE — Telephone Encounter (Signed)
Patient has questions regarding her procedure with Dr. Rayann Heman on 02/19/19. Patient needs to know about after care instructions and how long she will need care for after procedure. Husband is applying for FMLA and needs to know about these after care instructions as well. Please advise.

## 2019-02-12 NOTE — Telephone Encounter (Signed)
Call returned to Pt.  Advised Dr. Rayann Heman does not sign FMLA for family members because Pt does not require her husband to "care" for her post procedure.  Advised she would have some limitations.  Per wife, husband's work has offered to give him a week off.  Wife asked if a letter could be created for her that delineated her limitations for her husband to take to work.  Advised would provide letter and return FMLA paperwork.  Pt will pick up letter.

## 2019-02-16 ENCOUNTER — Other Ambulatory Visit (HOSPITAL_COMMUNITY)
Admission: RE | Admit: 2019-02-16 | Discharge: 2019-02-16 | Disposition: A | Discharge status: Home / Self Care | Payer: Medicaid Other | Source: Ambulatory Visit | Attending: Internal Medicine | Admitting: Internal Medicine

## 2019-02-16 DIAGNOSIS — Z01812 Encounter for preprocedural laboratory examination: Secondary | ICD-10-CM | POA: Insufficient documentation

## 2019-02-16 DIAGNOSIS — Z20828 Contact with and (suspected) exposure to other viral communicable diseases: Secondary | ICD-10-CM | POA: Diagnosis not present

## 2019-02-17 ENCOUNTER — Telehealth: Payer: Self-pay

## 2019-02-17 LAB — NOVEL CORONAVIRUS, NAA (HOSP ORDER, SEND-OUT TO REF LAB; TAT 18-24 HRS): SARS-CoV-2, NAA: NOT DETECTED

## 2019-02-17 NOTE — Telephone Encounter (Signed)
Left detailed message.  Advised Pt should arrive at 7:30 am for her procedure on 02/19/2019  Advised to call ONLY if that time will not work.

## 2019-02-19 ENCOUNTER — Other Ambulatory Visit: Payer: Self-pay

## 2019-02-19 ENCOUNTER — Ambulatory Visit (HOSPITAL_COMMUNITY)
Admission: RE | Admit: 2019-02-19 | Discharge: 2019-02-19 | Disposition: A | Discharge status: Home / Self Care | Payer: Medicaid Other | Attending: Internal Medicine | Admitting: Internal Medicine

## 2019-02-19 ENCOUNTER — Encounter (HOSPITAL_COMMUNITY): Payer: Self-pay | Admitting: Emergency Medicine

## 2019-02-19 ENCOUNTER — Ambulatory Visit (HOSPITAL_COMMUNITY): Payer: Medicaid Other

## 2019-02-19 ENCOUNTER — Ambulatory Visit (HOSPITAL_COMMUNITY)
Admission: RE | Disposition: A | Discharge status: Home / Self Care | Payer: Self-pay | Source: Home / Self Care | Attending: Internal Medicine

## 2019-02-19 DIAGNOSIS — K219 Gastro-esophageal reflux disease without esophagitis: Secondary | ICD-10-CM | POA: Insufficient documentation

## 2019-02-19 DIAGNOSIS — Z79899 Other long term (current) drug therapy: Secondary | ICD-10-CM | POA: Insufficient documentation

## 2019-02-19 DIAGNOSIS — I5022 Chronic systolic (congestive) heart failure: Secondary | ICD-10-CM | POA: Diagnosis not present

## 2019-02-19 DIAGNOSIS — Z793 Long term (current) use of hormonal contraceptives: Secondary | ICD-10-CM | POA: Insufficient documentation

## 2019-02-19 DIAGNOSIS — I428 Other cardiomyopathies: Secondary | ICD-10-CM | POA: Diagnosis not present

## 2019-02-19 DIAGNOSIS — I493 Ventricular premature depolarization: Secondary | ICD-10-CM | POA: Insufficient documentation

## 2019-02-19 DIAGNOSIS — Z95810 Presence of automatic (implantable) cardiac defibrillator: Secondary | ICD-10-CM

## 2019-02-19 HISTORY — PX: ICD IMPLANT: EP1208

## 2019-02-19 LAB — PREGNANCY, URINE: Preg Test, Ur: NEGATIVE

## 2019-02-19 LAB — SURGICAL PCR SCREEN
MRSA, PCR: NEGATIVE
Staphylococcus aureus: NEGATIVE

## 2019-02-19 SURGERY — ICD IMPLANT

## 2019-02-19 MED ORDER — FENTANYL CITRATE (PF) 100 MCG/2ML IJ SOLN
INTRAMUSCULAR | Status: AC
  Filled 2019-02-19: qty 2

## 2019-02-19 MED ORDER — SODIUM CHLORIDE 0.9 % IV SOLN
80.0000 mg | INTRAVENOUS | Status: AC
  Administered 2019-02-19: 80 mg
  Filled 2019-02-19: qty 2

## 2019-02-19 MED ORDER — CEFAZOLIN SODIUM-DEXTROSE 2-4 GM/100ML-% IV SOLN
INTRAVENOUS | Status: AC
  Filled 2019-02-19: qty 100

## 2019-02-19 MED ORDER — MIDAZOLAM HCL 5 MG/5ML IJ SOLN
INTRAMUSCULAR | Status: AC
  Filled 2019-02-19: qty 5

## 2019-02-19 MED ORDER — HEPARIN (PORCINE) IN NACL 1000-0.9 UT/500ML-% IV SOLN
INTRAVENOUS | Status: DC | PRN
  Administered 2019-02-19: 500 mL

## 2019-02-19 MED ORDER — FENTANYL CITRATE (PF) 100 MCG/2ML IJ SOLN
INTRAMUSCULAR | Status: DC | PRN
  Administered 2019-02-19 (×3): 12.5 ug via INTRAVENOUS

## 2019-02-19 MED ORDER — SODIUM CHLORIDE 0.9 % IV SOLN
INTRAVENOUS | Status: AC
  Filled 2019-02-19: qty 2

## 2019-02-19 MED ORDER — ACETAMINOPHEN 325 MG PO TABS
ORAL_TABLET | ORAL | Status: AC
  Filled 2019-02-19: qty 2

## 2019-02-19 MED ORDER — SODIUM CHLORIDE 0.9 % IV SOLN
INTRAVENOUS | Status: DC
  Administered 2019-02-19: 11:00:00 via INTRAVENOUS

## 2019-02-19 MED ORDER — MUPIROCIN 2 % EX OINT
TOPICAL_OINTMENT | CUTANEOUS | Status: AC
  Filled 2019-02-19: qty 22

## 2019-02-19 MED ORDER — ACETAMINOPHEN 325 MG PO TABS
325.0000 mg | ORAL_TABLET | ORAL | Status: DC | PRN
  Administered 2019-02-19: 650 mg via ORAL

## 2019-02-19 MED ORDER — SODIUM CHLORIDE 0.9% FLUSH: 3.0000 mL | INTRAVENOUS | Status: DC | PRN

## 2019-02-19 MED ORDER — CHLORHEXIDINE GLUCONATE 4 % EX LIQD
4.0000 "application " | Freq: Once | CUTANEOUS | Status: DC
  Filled 2019-02-19: qty 60

## 2019-02-19 MED ORDER — HEPARIN (PORCINE) IN NACL 1000-0.9 UT/500ML-% IV SOLN
INTRAVENOUS | Status: AC
  Filled 2019-02-19: qty 500

## 2019-02-19 MED ORDER — SODIUM CHLORIDE 0.9% FLUSH: 3.0000 mL | Freq: Two times a day (BID) | INTRAVENOUS | Status: DC

## 2019-02-19 MED ORDER — CEFAZOLIN SODIUM-DEXTROSE 2-4 GM/100ML-% IV SOLN
2.0000 g | INTRAVENOUS | Status: AC
  Administered 2019-02-19: 2 g via INTRAVENOUS
  Filled 2019-02-19: qty 100

## 2019-02-19 MED ORDER — SODIUM CHLORIDE 0.9 % IV SOLN: 250.0000 mL | INTRAVENOUS | Status: DC | PRN

## 2019-02-19 MED ORDER — MIDAZOLAM HCL 5 MG/5ML IJ SOLN
INTRAMUSCULAR | Status: DC | PRN
  Administered 2019-02-19 (×2): 1 mg via INTRAVENOUS

## 2019-02-19 MED ORDER — IOHEXOL 350 MG/ML SOLN
INTRAVENOUS | Status: DC | PRN
  Administered 2019-02-19: 15 mL

## 2019-02-19 MED ORDER — ONDANSETRON HCL 4 MG/2ML IJ SOLN
4.0000 mg | Freq: Four times a day (QID) | INTRAMUSCULAR | Status: DC | PRN
  Filled 2019-02-19: qty 2

## 2019-02-19 MED ORDER — LIDOCAINE HCL (PF) 1 % IJ SOLN
INTRAMUSCULAR | Status: DC | PRN
  Administered 2019-02-19: 40 mL

## 2019-02-19 MED ORDER — LIDOCAINE HCL (PF) 1 % IJ SOLN
INTRAMUSCULAR | Status: AC
  Filled 2019-02-19: qty 60

## 2019-02-19 SURGICAL SUPPLY — 6 items
CABLE SURGICAL S-101-97-12 (CABLE) ×2 IMPLANT
ICD GALLANT VR CDVRA500Q (ICD Generator) ×1 IMPLANT
LEAD DURATA 7122Q-65CM (Lead) ×1 IMPLANT
PAD PRO RADIOLUCENT 2001M-C (PAD) ×2 IMPLANT
SHEATH 7FR PRELUDE SNAP 13 (SHEATH) ×1 IMPLANT
TRAY PACEMAKER INSERTION (PACKS) ×2 IMPLANT

## 2019-02-19 NOTE — Discharge Instructions (Signed)
Supplemental Discharge Instructions for  Defibrillator Patients   FRIDAY, 02/20/2019, PLEASE SEND A REMOTE DEVICE TRANSMISSION     Activity No heavy lifting or vigorous activity with your left/right arm for 6 to 8 weeks.  Do not raise your left/right arm above your head for one week.  Gradually raise your affected arm as drawn below.             02/23/2019               02/24/2019              02/25/2019             02/26/2019 __  NO DRIVING for  1 week   ; you may begin driving on  [id number]  .  WOUND CARE - Remove arm sling tomorrow, 02/20/2019 - Keep the wound area clean and dry.  Do not get this area wet, no showers until cleared to at your wound check visit. - Remove outer plastic dressing from the wound tomorrow 02/20/2019, there are steri-strips underneath it covering the incision that stay in place, do NOT remove these - The tape/steri-strips on your wound will fall off; do not pull them off.  No bandage is needed on the site.  DO  NOT apply any creams, oils, or ointments to the wound area. - If you notice any drainage or discharge from the wound, any swelling or bruising at the site, or you develop a fever > 101? F after you are discharged home, call the office at once.  Special Instructions - You are still able to use cellular telephones; use the ear opposite the side where you have your pacemaker/defibrillator.  Avoid carrying your cellular phone near your device. - When traveling through airports, show security personnel your identification card to avoid being screened in the metal detectors.  Ask the security personnel to use the hand wand. - Avoid arc welding equipment, MRI testing (magnetic resonance imaging), TENS units (transcutaneous nerve stimulators).  Call the office for questions about other devices. - Avoid electrical appliances that are in poor condition or are not properly grounded. - Microwave ovens are safe to be near or to operate.  Additional  information for defibrillator patients should your device go off: - If your device goes off ONCE and you feel fine afterward, notify the device clinic nurses. - If your device goes off ONCE and you do not feel well afterward, call 911. - If your device goes off TWICE, call 911. - If your device goes off THREE times in one day, call 911.  DO NOT DRIVE YOURSELF OR A FAMILY MEMBER WITH A DEFIBRILLATOR TO THE HOSPITAL--CALL 911.  Moderate Conscious Sedation, Adult, Care After These instructions provide you with information about caring for yourself after your procedure. Your health care provider may also give you more specific instructions. Your treatment has been planned according to current medical practices, but problems sometimes occur. Call your health care provider if you have any problems or questions after your procedure. What can I expect after the procedure? After your procedure, it is common:  To feel sleepy for several hours.  To feel clumsy and have poor balance for several hours.  To have poor judgment for several hours.  To vomit if you eat too soon. Follow these instructions at home: For at least 24 hours after the procedure:   Do not: ? Participate in activities where you could fall or become injured. ? Drive. ? Use  heavy machinery. ? Drink alcohol. ? Take sleeping pills or medicines that cause drowsiness. ? Make important decisions or sign legal documents. ? Take care of children on your own.  Rest. Eating and drinking  Follow the diet recommended by your health care provider.  If you vomit: ? Drink water, juice, or soup when you can drink without vomiting. ? Make sure you have little or no nausea before eating solid foods. General instructions  Have a responsible adult stay with you until you are awake and alert.  Take over-the-counter and prescription medicines only as told by your health care provider.  If you smoke, do not smoke without  supervision.  Keep all follow-up visits as told by your health care provider. This is important. Contact a health care provider if:  You keep feeling nauseous or you keep vomiting.  You feel light-headed.  You develop a rash.  You have a fever. Get help right away if:  You have trouble breathing. This information is not intended to replace advice given to you by your health care provider. Make sure you discuss any questions you have with your health care provider. Document Released: 01/14/2013 Document Revised: 03/08/2017 Document Reviewed: 07/16/2015 Elsevier Patient Education  2020 [employer].

## 2019-02-19 NOTE — Interval H&P Note (Signed)
History and Physical Interval Note:  02/19/2019 11:13 AM  Tara Bradshaw  has presented today for surgery, with the diagnosis of Cardiomyopathy.  The various methods of treatment have been discussed with the patient and family. After consideration of risks, benefits and other options for treatment, the patient has consented to  Procedure(s): ICD IMPLANT (N/A) as a surgical intervention.  The patient's history has been reviewed, patient examined, no change in status, stable for surgery.  I have reviewed the patient's chart and labs.  Questions were answered to the patient's satisfaction.    ICD Criteria  Current LVEF:20%. Within 12 months prior to implant: Yes   Heart failure history: Yes, Class III  Cardiomyopathy history: Yes, Non-Ischemic Cardiomyopathy.  Atrial Fibrillation/Atrial Flutter: No.  Ventricular tachycardia history: No.  Cardiac arrest history: No.  History of syndromes with risk of sudden death: No.  Previous ICD: No.  Current ICD indication: Primary  PPM indication: No.  Class I or II Bradycardia indication present: No  Beta Blocker therapy for 3 or more months: Yes, prescribed.   Ace Inhibitor/ARB therapy for 3 or more months: Yes, prescribed.    I have seen Tara Bradshaw is a 33 y.o. female pre-procedural and has been referred by Dr Haroldine Laws for consideration of ICD implant for primary prevention of sudden death.  The patient's chart has been reviewed and they meet criteria for ICD implant.  I have had a thorough discussion with the patient reviewing options.  The patient has had opportunities to ask questions and have them answered. The patient and I have decided together through the [employer] Support Tool to proceed with single chamber ICD at this time.  Given QRS < 130 msec, she does not meet criteria for C RT.  Risks, benefits, alternatives to ICD implantation were discussed in detail with the patient today. The patient  understands  that the risks include but are not limited to bleeding, infection, pneumothorax, perforation, tamponade, vascular damage, renal failure, MI, stroke, death, inappropriate shocks, and lead dislodgement and wishes to proceed.    Thompson Grayer MD, [facility] 02/19/2019 11:16 AM

## 2019-02-19 NOTE — Progress Notes (Signed)
Pt went to CXR via [place], when she returned she felt nauseated , when I returned from getting zofran she had a bp of 83/56, placed her in trendelenburg, o 2 2L [place], pt quickly recovered without additional med or fluids, pt states she feels better

## 2019-02-20 ENCOUNTER — Encounter (HOSPITAL_COMMUNITY): Payer: Self-pay | Admitting: Internal Medicine

## 2019-02-20 MED FILL — Fentanyl Citrate Preservative Free (PF) Inj 100 MCG/2ML: INTRAMUSCULAR | Qty: 2 | Status: AC

## 2019-02-25 ENCOUNTER — Encounter (HOSPITAL_COMMUNITY): Payer: Self-pay | Admitting: *Deleted

## 2019-02-25 NOTE — Progress Notes (Signed)
Pt's husband needed FMLA forms completed so he can miss work as need to care for pt.  Forms completed and signed by Dr Haroldine Laws.  Pt is aware and state they will pick them up from front test 11/19 am

## 2019-03-03 ENCOUNTER — Other Ambulatory Visit: Payer: Self-pay

## 2019-03-03 ENCOUNTER — Ambulatory Visit (INDEPENDENT_AMBULATORY_CARE_PROVIDER_SITE_OTHER): Payer: Medicaid Other | Admitting: *Deleted

## 2019-03-03 DIAGNOSIS — I428 Other cardiomyopathies: Secondary | ICD-10-CM

## 2019-03-03 LAB — CUP PACEART INCLINIC DEVICE CHECK
Brady Statistic RV Percent Paced: 0 %
Date Time Interrogation Session: 20201124100048
Implantable Lead Implant Date: 20201112
Implantable Lead Location: 753860
Implantable Pulse Generator Implant Date: 20201112
Lead Channel Pacing Threshold Amplitude: 1 V
Lead Channel Pacing Threshold Pulse Width: 0.5 ms
Lead Channel Sensing Intrinsic Amplitude: 11.9 mV
Pulse Gen Serial Number: 111012702

## 2019-03-03 NOTE — Patient Instructions (Signed)
Call office if you have increased swelling at wound site or signs of infection.

## 2019-03-03 NOTE — Progress Notes (Signed)
Wound check appointment. Steri-strips removed. Wound without redness or edema. Incision edges approximated, wound well healed. Normal device function. Thresholds, sensing, and impedances consistent with implant measurements. Device programmed at 3.5V for extra safety margin until 3 month visit. Histogram distribution appropriate for patient and level of activity. No  ventricular arrhythmias noted. Patient educated about wound care, arm mobility, lifting restrictions, shock plan. ROV in 3 months with Dr Rayann Heman 05/28/19. Next remote transmission 05/22/19.

## 2019-03-04 ENCOUNTER — Other Ambulatory Visit (HOSPITAL_COMMUNITY): Payer: Self-pay

## 2019-03-04 MED ORDER — MECLIZINE HCL 25 MG PO TABS: 25.0000 mg | ORAL_TABLET | Freq: Three times a day (TID) | ORAL | 0 refills | Status: DC | PRN

## 2019-03-04 NOTE — Telephone Encounter (Signed)
Rx refilled.

## 2019-03-07 DIAGNOSIS — G4733 Obstructive sleep apnea (adult) (pediatric): Secondary | ICD-10-CM | POA: Diagnosis not present

## 2019-03-10 DIAGNOSIS — G4733 Obstructive sleep apnea (adult) (pediatric): Secondary | ICD-10-CM | POA: Diagnosis not present

## 2019-03-16 ENCOUNTER — Emergency Department (HOSPITAL_COMMUNITY): Payer: Medicaid Other

## 2019-03-16 ENCOUNTER — Encounter (HOSPITAL_COMMUNITY): Payer: Self-pay | Admitting: Emergency Medicine

## 2019-03-16 ENCOUNTER — Other Ambulatory Visit: Payer: Self-pay

## 2019-03-16 ENCOUNTER — Emergency Department (HOSPITAL_COMMUNITY)
Admission: EM | Admit: 2019-03-16 | Discharge: 2019-03-17 | Disposition: A | Discharge status: Home / Self Care | Payer: Medicaid Other | Attending: Emergency Medicine | Admitting: Emergency Medicine

## 2019-03-16 DIAGNOSIS — I491 Atrial premature depolarization: Secondary | ICD-10-CM | POA: Diagnosis not present

## 2019-03-16 DIAGNOSIS — R0689 Other abnormalities of breathing: Secondary | ICD-10-CM | POA: Diagnosis not present

## 2019-03-16 DIAGNOSIS — R079 Chest pain, unspecified: Secondary | ICD-10-CM | POA: Diagnosis not present

## 2019-03-16 DIAGNOSIS — R0602 Shortness of breath: Secondary | ICD-10-CM | POA: Diagnosis not present

## 2019-03-16 DIAGNOSIS — R0789 Other chest pain: Secondary | ICD-10-CM | POA: Diagnosis not present

## 2019-03-16 DIAGNOSIS — Z5321 Procedure and treatment not carried out due to patient leaving prior to being seen by health care provider: Secondary | ICD-10-CM | POA: Insufficient documentation

## 2019-03-16 LAB — CBC
HCT: 34.8 % — ABNORMAL LOW (ref 36.0–46.0)
Hemoglobin: 11.6 g/dL — ABNORMAL LOW (ref 12.0–15.0)
MCH: 26.5 pg (ref 26.0–34.0)
MCHC: 33.3 g/dL (ref 30.0–36.0)
MCV: 79.5 fL — ABNORMAL LOW (ref 80.0–100.0)
Platelets: 193 10*3/uL (ref 150–400)
RBC: 4.38 MIL/uL (ref 3.87–5.11)
RDW: 13.9 % (ref 11.5–15.5)
WBC: 9.6 10*3/uL (ref 4.0–10.5)
nRBC: 0 % (ref 0.0–0.2)

## 2019-03-16 LAB — BASIC METABOLIC PANEL
Anion gap: 11 (ref 5–15)
BUN: 6 mg/dL (ref 6–20)
CO2: 19 mmol/L — ABNORMAL LOW (ref 22–32)
Calcium: 9.5 mg/dL (ref 8.9–10.3)
Chloride: 108 mmol/L (ref 98–111)
Creatinine, Ser: 0.75 mg/dL (ref 0.44–1.00)
GFR calc Af Amer: >60 mL/min (ref >60)
GFR calc non Af Amer: >60 mL/min (ref >60)
Glucose, Bld: 88 mg/dL (ref 70–99)
Potassium: 3.7 mmol/L (ref 3.5–5.1)
Sodium: 138 mmol/L (ref 135–145)

## 2019-03-16 LAB — TROPONIN I (HIGH SENSITIVITY): Troponin I (High Sensitivity): 8 ng/L (ref <18)

## 2019-03-16 LAB — I-STAT BETA HCG BLOOD, ED (MC, WL, AP ONLY): I-stat hCG, quantitative: <5 m[IU]/mL (ref <5)

## 2019-03-16 LAB — BRAIN NATRIURETIC PEPTIDE: B Natriuretic Peptide: 207.8 pg/mL — ABNORMAL HIGH (ref 0.0–100.0)

## 2019-03-16 MED ORDER — SODIUM CHLORIDE 0.9% FLUSH: 3.0000 mL | Freq: Once | INTRAVENOUS | Status: DC

## 2019-03-16 NOTE — ED Triage Notes (Signed)
Patient with CHF, just had a defib/pacemaker placed 3 weeks ago, here with chest pain and shortness of breath.  It started suddenly just sitting on the couch.  The chest pressure started and radiates to her back.  No nausea or vomiting.  Patient is in and out of bigeminy, patient placed on oxygen, which slowed her ectopy down.

## 2019-03-17 ENCOUNTER — Telehealth: Payer: Self-pay | Admitting: Emergency Medicine

## 2019-03-17 NOTE — ED Notes (Signed)
Pt stated she does not want to go to the room anymore.

## 2019-03-17 NOTE — ED Notes (Signed)
Iv removed per pt request. Pt back and fourth about leaving or staying. This tech advised pt that she needed to stay.

## 2019-03-17 NOTE — ED Notes (Signed)
Pt stated that she will stay.

## 2019-03-17 NOTE — Telephone Encounter (Signed)
Patient had 4 recorded HVR episodes on 03/16/19 from 0724 to 0749 pm. The EGMs show 3 episodes of SVT that fell in the VT monitor zone with rates between 171- 187 bpm.  One episode that occurred at 0724 pm was labelled NSVT by the device and lasted 8 seconds that appears very similar to the SVT episodes. No therapy received. No missed doses of amiodorone, digoxin, or Coreg. Patient went to [facility] ED because she was experiencing CP, SOB and felt dizzy. Labs were drawn and resulted but patient left AMA before she was taken back to a room to be seen . Troponin was negative, BNP was elevated but < 300. No CP or complaints today.

## 2019-03-23 NOTE — Telephone Encounter (Signed)
Please schedule office visit with me or Jonni Sanger at next available time.

## 2019-03-24 NOTE — Telephone Encounter (Signed)
Notified that scheduler will be calling to set up appointment for visit with Dr Rayann Heman or Wynonia Lawman PA. ED instructions given and advised to call if she has any change in condition.

## 2019-03-25 ENCOUNTER — Emergency Department (HOSPITAL_COMMUNITY): Payer: Medicaid Other

## 2019-03-25 ENCOUNTER — Other Ambulatory Visit: Payer: Self-pay

## 2019-03-25 ENCOUNTER — Encounter (HOSPITAL_COMMUNITY): Payer: Self-pay | Admitting: *Deleted

## 2019-03-25 ENCOUNTER — Emergency Department (HOSPITAL_COMMUNITY)
Admission: EM | Admit: 2019-03-25 | Discharge: 2019-03-26 | Discharge status: Against Medical Advice | Payer: Medicaid Other | Attending: Emergency Medicine | Admitting: Emergency Medicine

## 2019-03-25 ENCOUNTER — Telehealth: Payer: Self-pay | Admitting: Cardiology

## 2019-03-25 DIAGNOSIS — E876 Hypokalemia: Secondary | ICD-10-CM | POA: Diagnosis not present

## 2019-03-25 DIAGNOSIS — I428 Other cardiomyopathies: Secondary | ICD-10-CM | POA: Insufficient documentation

## 2019-03-25 DIAGNOSIS — R079 Chest pain, unspecified: Secondary | ICD-10-CM | POA: Diagnosis not present

## 2019-03-25 DIAGNOSIS — Z5320 Procedure and treatment not carried out because of patient's decision for unspecified reasons: Secondary | ICD-10-CM | POA: Diagnosis not present

## 2019-03-25 DIAGNOSIS — R0602 Shortness of breath: Secondary | ICD-10-CM | POA: Diagnosis not present

## 2019-03-25 DIAGNOSIS — R110 Nausea: Secondary | ICD-10-CM | POA: Diagnosis not present

## 2019-03-25 DIAGNOSIS — Z95811 Presence of heart assist device: Secondary | ICD-10-CM | POA: Insufficient documentation

## 2019-03-25 DIAGNOSIS — Z79899 Other long term (current) drug therapy: Secondary | ICD-10-CM | POA: Insufficient documentation

## 2019-03-25 DIAGNOSIS — R42 Dizziness and giddiness: Secondary | ICD-10-CM | POA: Diagnosis not present

## 2019-03-25 LAB — TROPONIN I (HIGH SENSITIVITY)
Troponin I (High Sensitivity): 5 ng/L (ref <18)
Troponin I (High Sensitivity): 4 ng/L (ref <18)

## 2019-03-25 LAB — CBC
HCT: 34.4 % — ABNORMAL LOW (ref 36.0–46.0)
Hemoglobin: 10.9 g/dL — ABNORMAL LOW (ref 12.0–15.0)
MCH: 26.3 pg (ref 26.0–34.0)
MCHC: 31.7 g/dL (ref 30.0–36.0)
MCV: 83.1 fL (ref 80.0–100.0)
Platelets: 224 10*3/uL (ref 150–400)
RBC: 4.14 MIL/uL (ref 3.87–5.11)
RDW: 14.6 % (ref 11.5–15.5)
WBC: 7.2 10*3/uL (ref 4.0–10.5)
nRBC: 0 % (ref 0.0–0.2)

## 2019-03-25 LAB — BASIC METABOLIC PANEL
Anion gap: 8 (ref 5–15)
BUN: 8 mg/dL (ref 6–20)
CO2: 23 mmol/L (ref 22–32)
Calcium: 9.1 mg/dL (ref 8.9–10.3)
Chloride: 109 mmol/L (ref 98–111)
Creatinine, Ser: 0.71 mg/dL (ref 0.44–1.00)
GFR calc Af Amer: >60 mL/min (ref >60)
GFR calc non Af Amer: >60 mL/min (ref >60)
Glucose, Bld: 110 mg/dL — ABNORMAL HIGH (ref 70–99)
Potassium: 3.2 mmol/L — ABNORMAL LOW (ref 3.5–5.1)
Sodium: 140 mmol/L (ref 135–145)

## 2019-03-25 LAB — I-STAT BETA HCG BLOOD, ED (MC, WL, AP ONLY): I-stat hCG, quantitative: <5 m[IU]/mL (ref <5)

## 2019-03-25 LAB — MAGNESIUM: Magnesium: 1.8 mg/dL (ref 1.7–2.4)

## 2019-03-25 MED ORDER — SODIUM CHLORIDE 0.9% FLUSH: 3.0000 mL | Freq: Once | INTRAVENOUS | Status: DC

## 2019-03-25 MED ORDER — POTASSIUM CHLORIDE CRYS ER 20 MEQ PO TBCR
40.0000 meq | EXTENDED_RELEASE_TABLET | Freq: Once | ORAL | Status: AC
  Administered 2019-03-25: 40 meq via ORAL
  Filled 2019-03-25: qty 2

## 2019-03-25 NOTE — ED Provider Notes (Signed)
[facility] EMERGENCY DEPARTMENT Provider Note   CSN: [id number] Arrival date & time: 03/25/19  1950     History Chief Complaint  Patient presents with  . Dizziness    Tara Bradshaw is a 33 y.o. female.  Pt presents to the ED today with dizziness.  The pt also felt like she was going to pass out and some nausea.  The pt said she is feeling much better.  She does have a hx of nonischemic cm and had an AICD placed in November.  The pt denies f/c.  No sob.  The pt called her cardiologist who told her to come in for eval.        Past Medical History:  Diagnosis Date  . Anemia    CHRONIC  . Chronic systolic dysfunction of left ventricle   . GERD (gastroesophageal reflux disease)    WITH PREGNANCY  . Nonischemic cardiomyopathy ([place])   . Pneumonia    x 1  . PVC's (premature ventricular contractions)     Patient Active Problem List   Diagnosis Date Noted  . Subclinical hyperthyroidism 07/05/2018  . Abnormal transaminases 07/05/2018  . Acute congestive heart failure ([place]) 07/03/2018  . Anemia, postpartum 12/13/2017  . Normal postpartum course 12/13/2017  . Indication for care in labor or delivery 12/11/2017  . Hyperemesis affecting pregnancy, antepartum 07/25/2017  . Dehydration during pregnancy 07/23/2017  . Obesity, Class III, BMI 40-49.9 (morbid obesity) ([place]) 11/26/2016  . GERD (gastroesophageal reflux disease) 11/26/2016    Past Surgical History:  Procedure Laterality Date  . DILATION AND EVACUATION N/A 11/30/2016   Procedure: DILATATION AND EVACUATION;  Surgeon: Donnamae Jude, MD;  Location: [facility];  Service: Gynecology;  Laterality: N/A;  . ICD IMPLANT N/A 02/19/2019   Procedure: ICD IMPLANT;  Surgeon: Thompson Grayer, MD;  Location: [facility];  Service: Cardiovascular;  Laterality: N/A;  . RIGHT/LEFT HEART CATH AND CORONARY ANGIOGRAPHY N/A 01/22/2019   Procedure: RIGHT/LEFT HEART CATH AND CORONARY ANGIOGRAPHY;  Surgeon: Jolaine Artist, MD;  Location: [facility];  Service: Cardiovascular;  Laterality: N/A;  . WISDOM TOOTH EXTRACTION     NO ANESTHESIA     OB History    Gravida  3   Para  2   Term  2   Preterm      AB  1   Living  2     SAB  1   TAB      Ectopic      Multiple  0   Live Births  2        Obstetric Comments  G2- D&C for missed AB        Family History  Problem Relation Age of Onset  . Epilepsy Mother   . Asthma Mother   . Cancer Paternal Grandmother        BREAST  . Cancer Maternal Aunt 52       BREAST  . Hypertension Maternal Grandmother     Social History   Tobacco Use  . Smoking status: Never Smoker  . Smokeless tobacco: Never Used  Substance Use Topics  . Alcohol use: No  . Drug use: No    Home Medications Prior to Admission medications   Medication Sig Start Date End Date Taking? Authorizing Provider  acetaminophen (TYLENOL) 325 MG tablet Take 650 mg by mouth every 6 (six) hours as needed for mild pain or headache.    [provider]  amiodarone (PACERONE) 200  MG tablet Take 1 tablet (200 mg total) by mouth daily. 08/01/18   Bensimhon, Shaune Pascal, MD  carvedilol (COREG) 3.125 MG tablet Take 1 tablet (3.125 mg total) by mouth 2 (two) times daily. 11/14/18 02/13/19  Bensimhon, Shaune Pascal, MD  digoxin (LANOXIN) 0.125 MG tablet Take 1 tablet (0.125 mg total) by mouth daily. 08/01/18   Bensimhon, Shaune Pascal, MD  furosemide (LASIX) 40 MG tablet Take 1 tablet (40 mg total) by mouth 2 (two) times daily. Patient taking differently: Take 40 mg by mouth daily.  09/02/18   Bensimhon, Shaune Pascal, MD  isosorbide-hydrALAZINE (BIDIL) 20-37.5 MG tablet Take 1 tablet by mouth 3 (three) times daily. Patient taking differently: Take 1 tablet by mouth daily.  12/08/18   Bensimhon, Shaune Pascal, MD  levonorgestrel (MIRENA) 20 MCG/24HR IUD 1 each by Intrauterine route once.    [provider]  meclizine (ANTIVERT) 25 MG tablet Take 1 tablet (25 mg total) by mouth 3  (three) times daily as needed for dizziness. 03/04/19   Bensimhon, Shaune Pascal, MD  potassium chloride SA (K-DUR) 20 MEQ tablet Take 1 tablet (20 mEq total) by mouth daily. 08/01/18 11/14/19  Bensimhon, Shaune Pascal, MD  sacubitril-valsartan (ENTRESTO) 49-51 MG Take 2 tablets by mouth 2 (two) times daily.     [provider]  sacubitril-valsartan (ENTRESTO) 97-103 MG Take 1 tablet by mouth 2 (two) times daily. 12/18/18   Bensimhon, Shaune Pascal, MD  spironolactone (ALDACTONE) 25 MG tablet Take 1 tablet (25 mg total) by mouth daily. Patient taking differently: Take 12.5 mg by mouth daily.  12/08/18   Bensimhon, Shaune Pascal, MD    Allergies    Patient has no known allergies.  Review of Systems   Review of Systems  Neurological: Positive for dizziness.  All other systems reviewed and are negative.   Physical Exam Updated Vital Signs BP 124/80   Pulse 62   Temp 98.3 F (36.8 C) (Oral)   Resp 20   LMP 03/02/2019   SpO2 100%   Physical Exam Vitals and nursing note reviewed.  Constitutional:      Appearance: Normal appearance.  HENT:     Head: Normocephalic and atraumatic.     Right Ear: External ear normal.     Left Ear: External ear normal.     Nose: Nose normal.     Mouth/Throat:     Mouth: Mucous membranes are moist.     Pharynx: Oropharynx is clear.  Eyes:     Extraocular Movements: Extraocular movements intact.     Conjunctiva/sclera: Conjunctivae normal.     Pupils: Pupils are equal, round, and reactive to light.  Cardiovascular:     Rate and Rhythm: Normal rate and regular rhythm.     Pulses: Normal pulses.     Heart sounds: Normal heart sounds.  Pulmonary:     Effort: Pulmonary effort is normal.     Breath sounds: Normal breath sounds.  Abdominal:     General: Abdomen is flat. Bowel sounds are normal.     Palpations: Abdomen is soft.  Musculoskeletal:        General: Normal range of motion.     Cervical back: Normal range of motion and neck supple.  Skin:    General:  Skin is warm.     Capillary Refill: Capillary refill takes less than 2 seconds.  Neurological:     General: No focal deficit present.     Mental Status: She is alert and oriented to person, place, and  time.  Psychiatric:        Mood and Affect: Mood normal.        Behavior: Behavior normal.        Thought Content: Thought content normal.        Judgment: Judgment normal.     ED Results / Procedures / Treatments   Labs (all labs ordered are listed, but only abnormal results are displayed) Labs Reviewed  BASIC METABOLIC PANEL - Abnormal; Notable for the following components:      Result Value   Potassium 3.2 (*)    Glucose, Bld 110 (*)    All other components within normal limits  CBC - Abnormal; Notable for the following components:   Hemoglobin 10.9 (*)    HCT 34.4 (*)    All other components within normal limits  MAGNESIUM  I-STAT BETA HCG BLOOD, ED ([facility], [facility], [facility] ONLY)  TROPONIN I (HIGH SENSITIVITY)  TROPONIN I (HIGH SENSITIVITY)    EKG EKG Interpretation  Date/Time:  Wednesday March 25 2019 20:02:42 EST Ventricular Rate:  64 PR Interval:  148 QRS Duration: 106 QT Interval:  392 QTC Calculation: 404 R Axis:   56 Text Interpretation: Normal sinus rhythm Incomplete left bundle branch block ST & T wave abnormality, consider inferolateral ischemia Abnormal ECG No significant change since last tracing Confirmed by Isla Pence [id number]) on 03/25/2019 10:13:00 PM   Radiology DG Chest 2 View  Result Date: 03/25/2019 CLINICAL DATA:  Left-sided chest pain, shortness of breath EXAM: CHEST - 2 VIEW COMPARISON:  None. FINDINGS: Single lead left-sided implanted cardiac device in stable positioning. Cardiomegaly, unchanged. Pulmonary vasculature is within normal limits. No focal airspace consolidation, pleural effusion, or pneumothorax. Osseous structures intact and unremarkable. IMPRESSION: No acute cardiopulmonary findings. Electronically Signed   By: Davina Poke M.D.    On: 03/25/2019 21:16    Procedures Procedures (including critical care time)  Medications Ordered in ED Medications  sodium chloride flush (NS) 0.9 % injection 3 mL (has no administration in time range)  potassium chloride SA (KLOR-CON) CR tablet 40 mEq (40 mEq Oral Given 03/25/19 2338)    ED Course  I have reviewed the triage vital signs and the nursing notes.  Pertinent labs & imaging results that were available during my care of the patient were reviewed by me and considered in my medical decision making (see chart for details).    MDM Rules/Calculators/A&P                      Pt's K is low, so that is replaced.  ACID will be interrogated.  Pt signed out to Dr. Randal Buba at shift change.   Final Clinical Impression(s) / ED Diagnoses Final diagnoses:  Hypokalemia  Nonischemic cardiomyopathy [facility])    Rx / DC Orders ED Discharge Orders    None       Isla Pence, MD 03/25/19 2344

## 2019-03-25 NOTE — ED Triage Notes (Signed)
Pt reports around 4:30 today she was lying down resting and had onset of dizziness, heaviness in her chest,  and shortness of breath. She called her cardiologist who wanted her further evaluated in the ED. Hx of ICD/pacemaker.

## 2019-03-25 NOTE — Telephone Encounter (Signed)
Pt called with dizziness near syncope and nausea, I have asked her to come to ER to be evaluated.  She will not drive.

## 2019-03-25 NOTE — ED Notes (Signed)
Assumed care on patient , patient denies pain / respirations unlabored , interrogation of ICD in progress.

## 2019-03-25 NOTE — ED Notes (Signed)
[employer] service representative advised RN that the interrogating apparatus is not compatible with patient's ICD model , they will send someone to the hospital to interrogate her ICD.

## 2019-03-26 LAB — CUP PACEART REMOTE DEVICE CHECK
Date Time Interrogation Session: 20201217064927
Implantable Lead Implant Date: 20201112
Implantable Lead Location: 753860
Implantable Pulse Generator Implant Date: 20201112
Pulse Gen Serial Number: 111012702

## 2019-03-26 NOTE — ED Notes (Signed)
Dr. Randal Buba notified that patient want to leave AMA , patient upset due to long wait on her ICD transmission result.

## 2019-03-26 NOTE — ED Notes (Signed)
Patient transmitted her ICD interrogation using her cell phone to [place] .

## 2019-04-04 IMAGING — US US OB TRANSVAGINAL
1 series · 15 of 28 positions shown · non-contrast
Comparison: None.

CLINICAL DATA: 30-year-old female reportedly 15 weeks 5 days
pregnant (bite last menstrual. ) with lower midline abdominal pain

EXAM:
OBSTETRIC <14 WK US AND TRANSVAGINAL OB US
TECHNIQUE: Both transabdominal and transvaginal ultrasound examinations were
performed for complete evaluation of the gestation as well as the
maternal uterus, adnexal regions, and pelvic cul-de-sac.
Transvaginal technique was performed to assess early pregnancy.

[Series 1: us ob transvaginal · 15 of 51 slices shown]
[im 1/51]
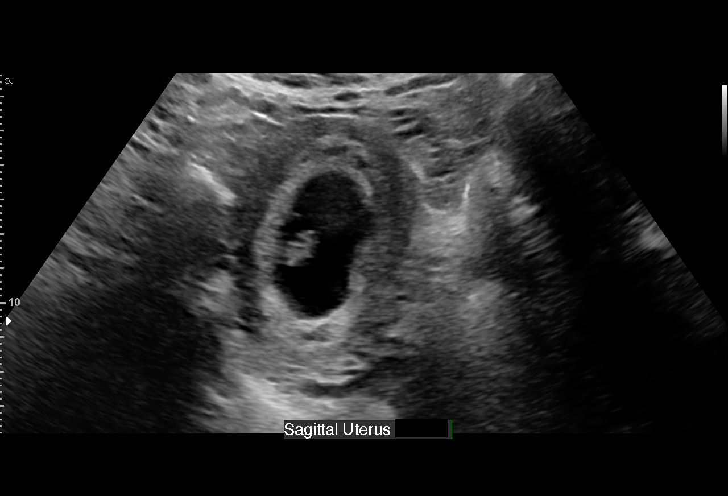
[im 4/51]
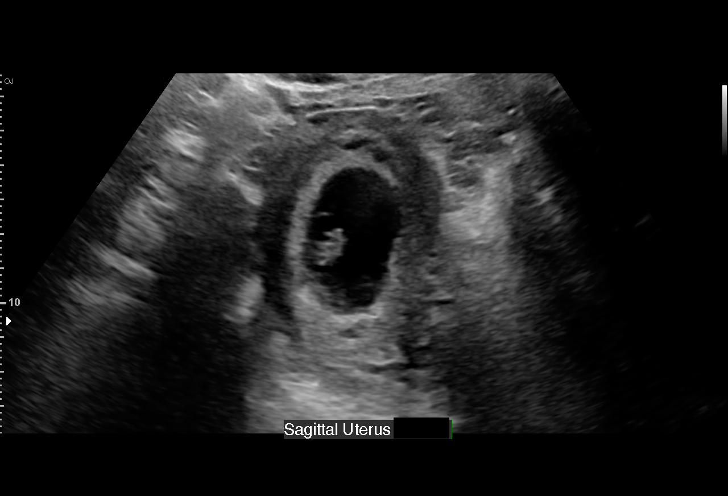
[im 8/51]
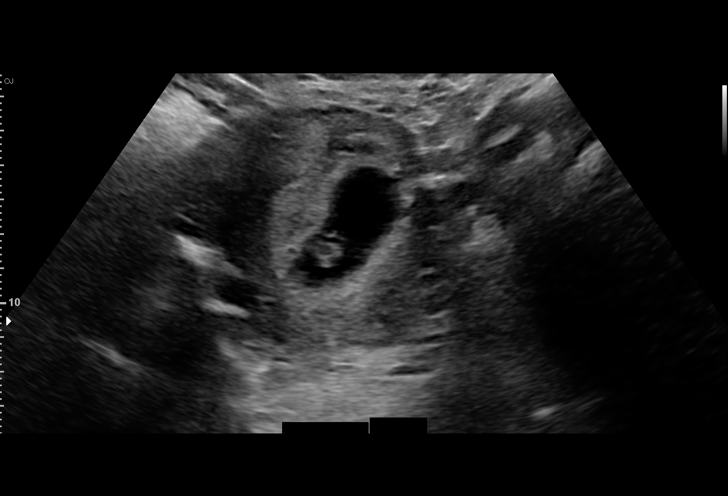
[im 12/51]
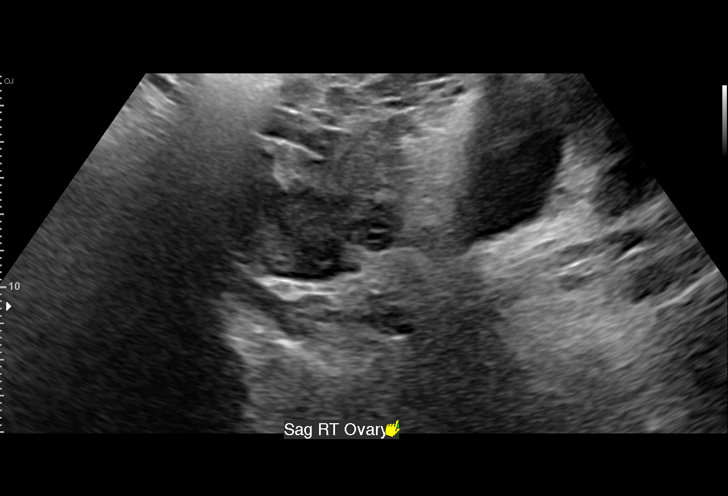
[im 15/51]
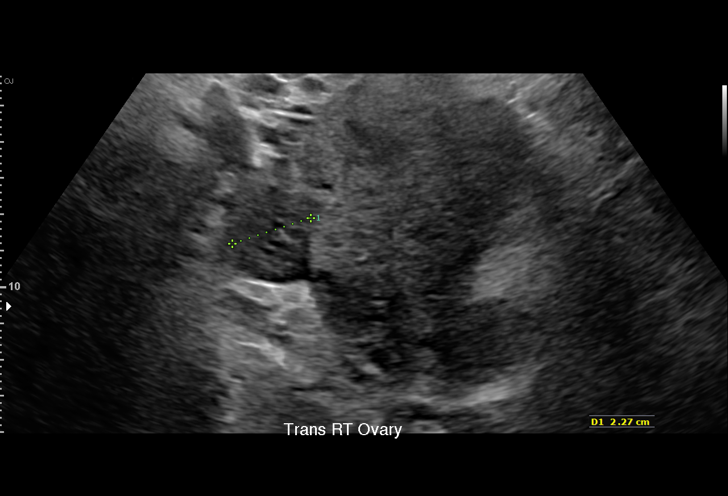
[im 19/51]
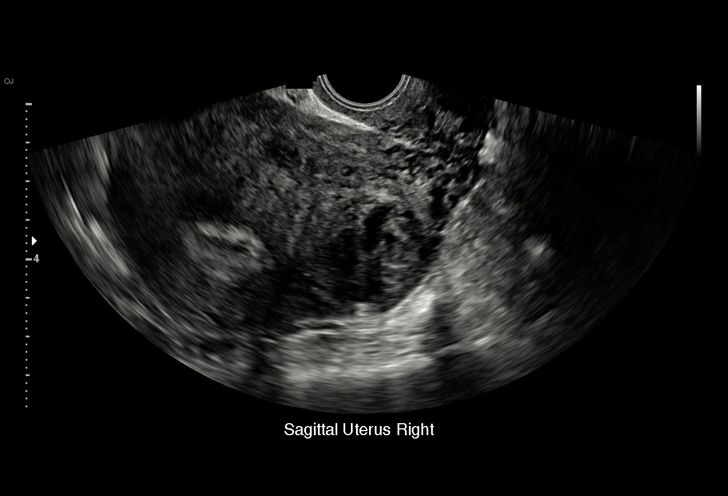
[im 23/51]
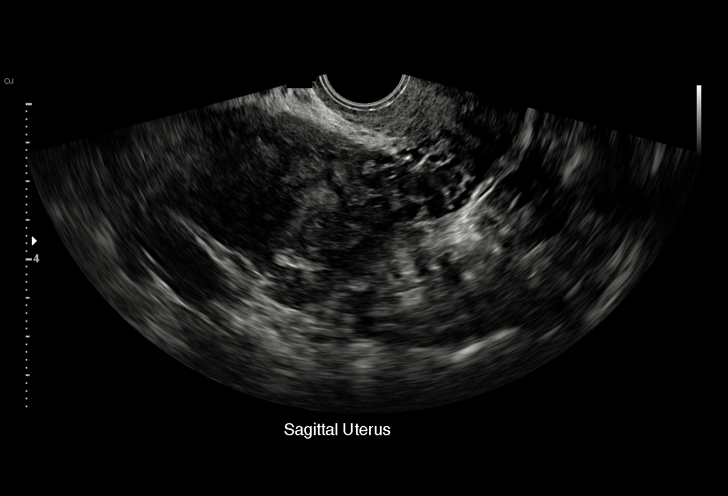
[im 26/51]
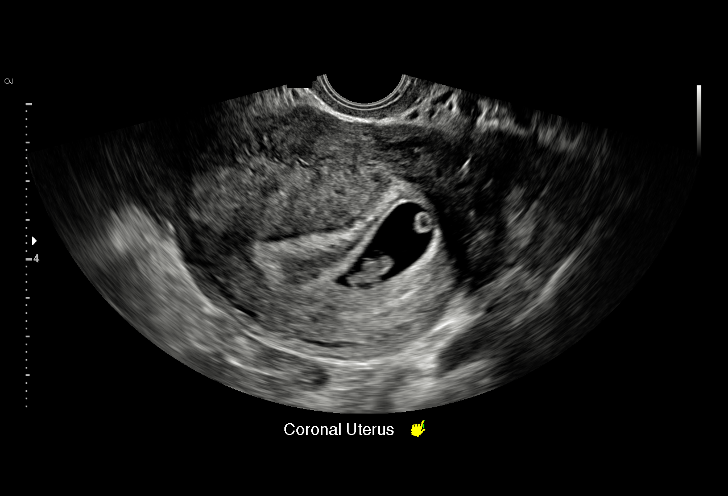
[im 28/51]
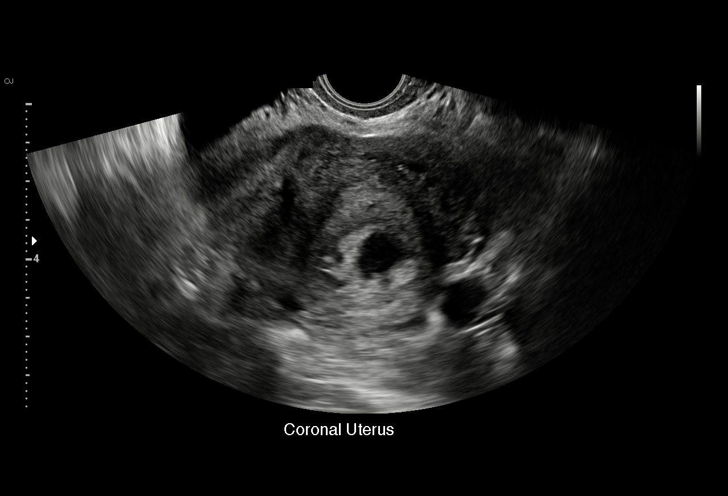
[im 32/51]
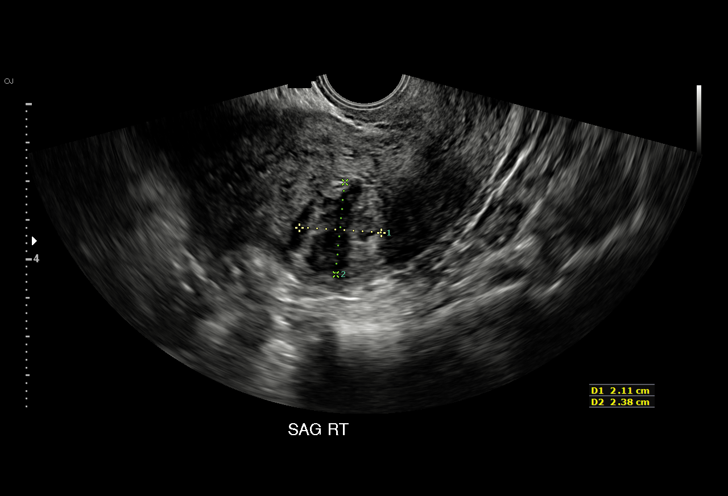
[im 36/51]
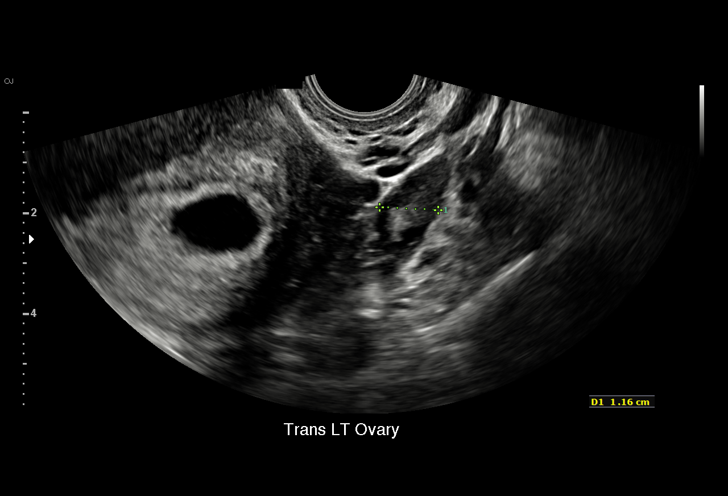
[im 39/51]
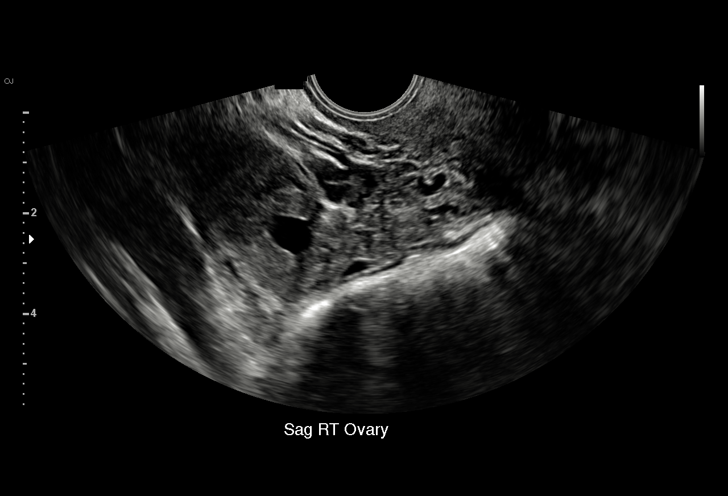
[im 43/51]
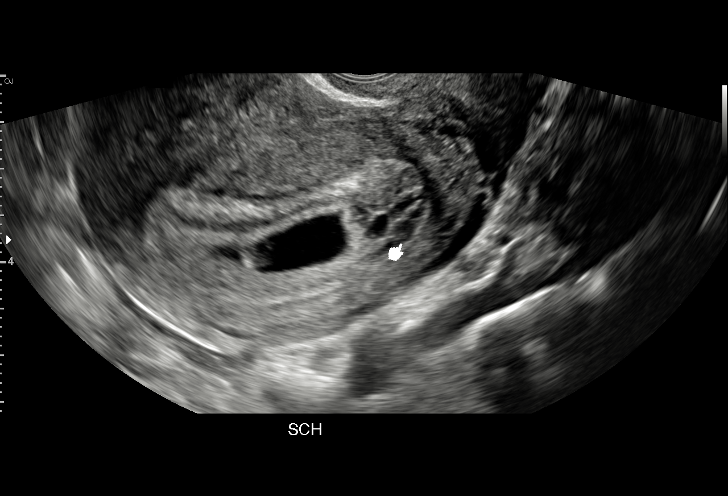
[im 47/51]
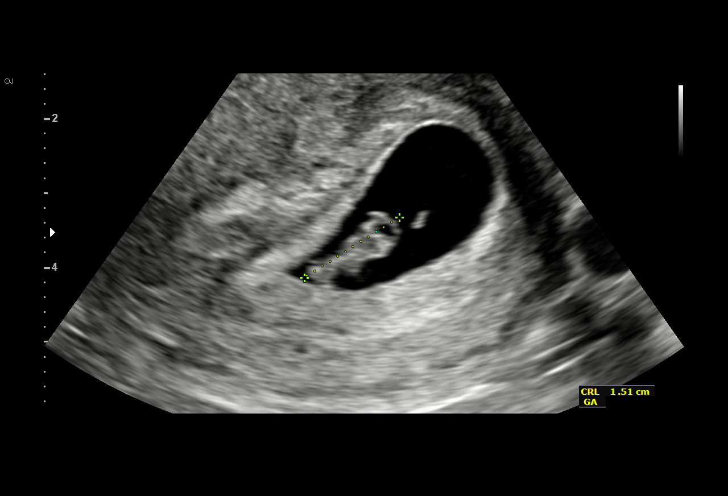
[im 51/51]
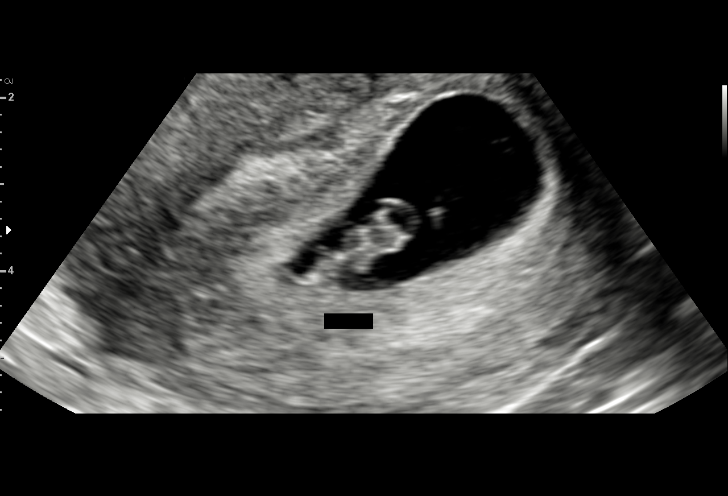

[15 of 28 positions shown; findings below may reference images not displayed]

FINDINGS: Intrauterine gestational sac: Single

Yolk sac:  Visualized.

Embryo:  Visualized.

Cardiac Activity: Visualized.

Heart Rate: 154  bpm

CRL:  15  Mm   7 w   6 d                  US EDC: June 13, 2017

Subchorionic hemorrhage:  Small

Maternal uterus/adnexae: Normal appearance of the ovaries.
Heterogeneous soft tissue nodule in the posterior aspect of the
right uterus measures 2.6 cm and is most consistent with a sternal
uterine fibroid.
IMPRESSION: 1. Single live intrauterine pregnancy. By crown-rump length, the
estimated gestational age is 7 weeks 6 days and the estimated date
of confinement is June 13, 2017.
2. The fetal heart rate is 154 beats per minute.
3. Small subchorionic hemorrhage.
4. Incidental note is made of a 2.6 cm uterine fibroid along the
right posterior uterine wall.

## 2019-04-06 ENCOUNTER — Ambulatory Visit: Payer: Medicaid Other | Admitting: Internal Medicine

## 2019-04-06 ENCOUNTER — Encounter: Payer: Self-pay | Admitting: Internal Medicine

## 2019-04-06 ENCOUNTER — Other Ambulatory Visit: Payer: Self-pay

## 2019-04-06 VITALS — BP 114/78 | HR 64 | Ht 61.0 in | Wt 258.6 lb

## 2019-04-06 DIAGNOSIS — I428 Other cardiomyopathies: Secondary | ICD-10-CM | POA: Diagnosis not present

## 2019-04-06 DIAGNOSIS — I471 Supraventricular tachycardia: Secondary | ICD-10-CM | POA: Diagnosis not present

## 2019-04-06 DIAGNOSIS — I493 Ventricular premature depolarization: Secondary | ICD-10-CM | POA: Diagnosis not present

## 2019-04-06 DIAGNOSIS — I429 Cardiomyopathy, unspecified: Secondary | ICD-10-CM | POA: Diagnosis not present

## 2019-04-06 DIAGNOSIS — Z95810 Presence of automatic (implantable) cardiac defibrillator: Secondary | ICD-10-CM | POA: Diagnosis not present

## 2019-04-06 MED ORDER — CARVEDILOL 6.25 MG PO TABS: 6.2500 mg | ORAL_TABLET | Freq: Two times a day (BID) | ORAL | 3 refills | Status: DC

## 2019-04-06 NOTE — Patient Instructions (Addendum)
Medication Instructions:   Dr Rayann Heman wants you to increase your Carveilol to 6.25mg  by mouth twice daily.  Labwork: None ordered.  Testing/Procedures: .A chest x-ray takes a picture of the organs and structures inside the chest, including the heart, lungs, and blood vessels. This test can show several things, including, whether the heart is enlarges; whether fluid is building up in the lungs; and whether pacemaker / defibrillator leads are still in place.   [facility] in [place].  They are open until 5pm today.       Follow-Up:Your physician wants you to follow-up in:as scheduled with Dr Rayann Heman.  Remote monitoring is used to monitor your  ICD from home. This monitoring reduces the number of office visits required to check your device to one time per year. It allows [place] to keep an eye on the functioning of your device to ensure it is working properly. You are scheduled for a device check from home on 05/22/2019. You may send your transmission at any time that day. If you have a wireless device, the transmission will be sent automatically. After your physician reviews your transmission, you will receive a postcard with your next transmission date.  Any Other Special Instructions Will Be Listed Below (If Applicable). We will refer you to Sharman Cheek with [facility] and she will contact you.  If you need a refill on your cardiac medications before your next appointment, please call your pharmacy.

## 2019-04-06 NOTE — H&P (View-Only) (Signed)
PCP: Patient, No Pcp Per Primary Cardiologist: Dr Haroldine Laws Primary EP: Dr Nada Maclachlan is a 33 y.o. female who presents today for routine electrophysiology followup.  Since her ICD implant, the patient reports doing reasonably well.  She has occasional symptomatic PVCs.  She had an episode of SVT on 03/16/2019 at 8:48 pm lasting 20 minutes with therapy appropriately withheld.  Her ICD pocket has healed.  No recent drainage or issues.  Today, she denies symptoms of chest pain, shortness of breath,  lower extremity edema, dizziness, presyncope, syncope, or ICD shocks.  The patient is otherwise without complaint today.   Past Medical History:  Diagnosis Date  . Anemia    CHRONIC  . Chronic systolic dysfunction of left ventricle   . GERD (gastroesophageal reflux disease)    WITH PREGNANCY  . Nonischemic cardiomyopathy ([place])   . Pneumonia    x 1  . PVC's (premature ventricular contractions)    Past Surgical History:  Procedure Laterality Date  . DILATION AND EVACUATION N/A 11/30/2016   Procedure: DILATATION AND EVACUATION;  Surgeon: Donnamae Jude, MD;  Location: [facility];  Service: Gynecology;  Laterality: N/A;  . ICD IMPLANT N/A 02/19/2019   Procedure: ICD IMPLANT;  Surgeon: Thompson Grayer, MD;  Location: [facility];  Service: Cardiovascular;  Laterality: N/A;  . RIGHT/LEFT HEART CATH AND CORONARY ANGIOGRAPHY N/A 01/22/2019   Procedure: RIGHT/LEFT HEART CATH AND CORONARY ANGIOGRAPHY;  Surgeon: Jolaine Artist, MD;  Location: [place] [facility];  Service: Cardiovascular;  Laterality: N/A;  . WISDOM TOOTH EXTRACTION     NO ANESTHESIA    ROS- all systems are reviewed and negative except as per HPI above  Current Outpatient Medications  Medication Sig Dispense Refill  . acetaminophen (TYLENOL) 325 MG tablet Take 650 mg by mouth every 6 (six) hours as needed for mild pain or headache.    Marland Kitchen amiodarone (PACERONE) 200 MG tablet Take 1 tablet (200 mg total) by mouth  daily. 90 tablet 0  . carvedilol (COREG) 3.125 MG tablet Take 1 tablet (3.125 mg total) by mouth 2 (two) times daily. 180 tablet 3  . digoxin (LANOXIN) 0.125 MG tablet Take 1 tablet (0.125 mg total) by mouth daily. 90 tablet 0  . furosemide (LASIX) 40 MG tablet Take 1 tablet (40 mg total) by mouth 2 (two) times daily. 60 tablet 6  . isosorbide-hydrALAZINE (BIDIL) 20-37.5 MG tablet Take 1 tablet by mouth 3 (three) times daily. 90 tablet 6  . levonorgestrel (MIRENA) 20 MCG/24HR IUD 1 each by Intrauterine route once.    . meclizine (ANTIVERT) 25 MG tablet Take 1 tablet (25 mg total) by mouth 3 (three) times daily as needed for dizziness. 15 tablet 0  . potassium chloride SA (K-DUR) 20 MEQ tablet Take 1 tablet (20 mEq total) by mouth daily. 90 tablet 0  . sacubitril-valsartan (ENTRESTO) 97-103 MG Take 1 tablet by mouth 2 (two) times daily. 60 tablet 6  . spironolactone (ALDACTONE) 25 MG tablet Take 1 tablet (25 mg total) by mouth daily. 30 tablet 6  . sacubitril-valsartan (ENTRESTO) 49-51 MG Take 2 tablets by mouth 2 (two) times daily.      No current facility-administered medications for this visit.    Physical Exam: Vitals:   04/06/19 1500  BP: 114/78  Pulse: 64  SpO2: 98%  Weight: 258 lb 9.6 oz (117.3 kg)  Height: 5\' 1"  (1.549 m)    GEN- The patient is overweight appearing, alert and oriented x 3  today.   Head- normocephalic, atraumatic Eyes-  Sclera clear, conjunctiva pink Ears- hearing intact Oropharynx- clear Lungs-  normal work of breathing Chest- ICD pocket is well healed Heart- Regular rate and rhythm  GI- sof  Extremities- no clubbing, cyanosis, or edema  ICD interrogation- reviewed in detail today,  See PACEART report  ekg tracing ordered today is personally reviewed and shows sinus rhythm 64 bpm, PR 162 msec, QRS 106 msec, LAHB  Wt Readings from Last 3 Encounters:  04/06/19 258 lb 9.6 oz (117.3 kg)  02/19/19 251 lb (113.9 kg)  02/04/19 251 lb (113.9 kg)     Assessment and Plan:  1.  Chronic systolic dysfunction/ nonischemic CM euvolemic today, though recently (over Christmas) she did have increased sodium intake and this is reflected in her Cor[place].  We discussed sodium restriction today Stable on an appropriate medical regimen Normal ICD function though RV pacing output has increased.  Impedance and sensing are stable.  Will obtain CXR.  No changes are required at his time as she does not RV pace. See Claudia Desanctis Art report No changes today she is not device dependant today Enroll in ICM device clinic  2. SVT New diagnosis Increase coreg to 6.25mg  BID Continue to follow  3. PVCs Intermittent in frequency She is on amiodarone 200mg  daily as per CHF clinic team Too infrequent for mapping/ ablation at this time Continue to titrate coreg as she tolerates  Return as scheduled in February Follow-up with CHF clinic as scheduled  Thompson Grayer MD, [facility] 04/06/2019 3:31 PM

## 2019-04-06 NOTE — Progress Notes (Signed)
PCP: Patient, No Pcp Per Primary Cardiologist: Dr Haroldine Laws Primary EP: Dr Nada Maclachlan is a 33 y.o. female who presents today for routine electrophysiology followup.  Since her ICD implant, the patient reports doing reasonably well.  She has occasional symptomatic PVCs.  She had an episode of SVT on 03/16/2019 at 8:48 pm lasting 20 minutes with therapy appropriately withheld.  Her ICD pocket has healed.  No recent drainage or issues.  Today, she denies symptoms of chest pain, shortness of breath,  lower extremity edema, dizziness, presyncope, syncope, or ICD shocks.  The patient is otherwise without complaint today.   Past Medical History:  Diagnosis Date  . Anemia    CHRONIC  . Chronic systolic dysfunction of left ventricle   . GERD (gastroesophageal reflux disease)    WITH PREGNANCY  . Nonischemic cardiomyopathy ([place])   . Pneumonia    x 1  . PVC's (premature ventricular contractions)    Past Surgical History:  Procedure Laterality Date  . DILATION AND EVACUATION N/A 11/30/2016   Procedure: DILATATION AND EVACUATION;  Surgeon: Donnamae Jude, MD;  Location: [facility];  Service: Gynecology;  Laterality: N/A;  . ICD IMPLANT N/A 02/19/2019   Procedure: ICD IMPLANT;  Surgeon: Thompson Grayer, MD;  Location: [place] [facility];  Service: Cardiovascular;  Laterality: N/A;  . RIGHT/LEFT HEART CATH AND CORONARY ANGIOGRAPHY N/A 01/22/2019   Procedure: RIGHT/LEFT HEART CATH AND CORONARY ANGIOGRAPHY;  Surgeon: Jolaine Artist, MD;  Location: [facility];  Service: Cardiovascular;  Laterality: N/A;  . WISDOM TOOTH EXTRACTION     NO ANESTHESIA    ROS- all systems are reviewed and negative except as per HPI above  Current Outpatient Medications  Medication Sig Dispense Refill  . acetaminophen (TYLENOL) 325 MG tablet Take 650 mg by mouth every 6 (six) hours as needed for mild pain or headache.    Marland Kitchen amiodarone (PACERONE) 200 MG tablet Take 1 tablet (200 mg total) by mouth  daily. 90 tablet 0  . carvedilol (COREG) 3.125 MG tablet Take 1 tablet (3.125 mg total) by mouth 2 (two) times daily. 180 tablet 3  . digoxin (LANOXIN) 0.125 MG tablet Take 1 tablet (0.125 mg total) by mouth daily. 90 tablet 0  . furosemide (LASIX) 40 MG tablet Take 1 tablet (40 mg total) by mouth 2 (two) times daily. 60 tablet 6  . isosorbide-hydrALAZINE (BIDIL) 20-37.5 MG tablet Take 1 tablet by mouth 3 (three) times daily. 90 tablet 6  . levonorgestrel (MIRENA) 20 MCG/24HR IUD 1 each by Intrauterine route once.    . meclizine (ANTIVERT) 25 MG tablet Take 1 tablet (25 mg total) by mouth 3 (three) times daily as needed for dizziness. 15 tablet 0  . potassium chloride SA (K-DUR) 20 MEQ tablet Take 1 tablet (20 mEq total) by mouth daily. 90 tablet 0  . sacubitril-valsartan (ENTRESTO) 97-103 MG Take 1 tablet by mouth 2 (two) times daily. 60 tablet 6  . spironolactone (ALDACTONE) 25 MG tablet Take 1 tablet (25 mg total) by mouth daily. 30 tablet 6  . sacubitril-valsartan (ENTRESTO) 49-51 MG Take 2 tablets by mouth 2 (two) times daily.      No current facility-administered medications for this visit.    Physical Exam: Vitals:   04/06/19 1500  BP: 114/78  Pulse: 64  SpO2: 98%  Weight: 258 lb 9.6 oz (117.3 kg)  Height: 5\' 1"  (1.549 m)    GEN- The patient is overweight appearing, alert and oriented x 3  today.   Head- normocephalic, atraumatic Eyes-  Sclera clear, conjunctiva pink Ears- hearing intact Oropharynx- clear Lungs-  normal work of breathing Chest- ICD pocket is well healed Heart- Regular rate and rhythm  GI- sof  Extremities- no clubbing, cyanosis, or edema  ICD interrogation- reviewed in detail today,  See PACEART report  ekg tracing ordered today is personally reviewed and shows sinus rhythm 64 bpm, PR 162 msec, QRS 106 msec, LAHB  Wt Readings from Last 3 Encounters:  04/06/19 258 lb 9.6 oz (117.3 kg)  02/19/19 251 lb (113.9 kg)  02/04/19 251 lb (113.9 kg)     Assessment and Plan:  1.  Chronic systolic dysfunction/ nonischemic CM euvolemic today, though recently (over Christmas) she did have increased sodium intake and this is reflected in her Corview.  We discussed sodium restriction today Stable on an appropriate medical regimen Normal ICD function though RV pacing output has increased.  Impedance and sensing are stable.  Will obtain CXR.  No changes are required at his time as she does not RV pace. See Claudia Desanctis Art report No changes today she is not device dependant today Enroll in ICM device clinic  2. SVT New diagnosis Increase coreg to 6.25mg  BID Continue to follow  3. PVCs Intermittent in frequency She is on amiodarone 200mg  daily as per CHF clinic team Too infrequent for mapping/ ablation at this time Continue to titrate coreg as she tolerates  Return as scheduled in February Follow-up with CHF clinic as scheduled  Thompson Grayer MD, [facility] 04/06/2019 3:31 PM

## 2019-04-09 ENCOUNTER — Other Ambulatory Visit: Payer: Self-pay

## 2019-04-09 ENCOUNTER — Ambulatory Visit (INDEPENDENT_AMBULATORY_CARE_PROVIDER_SITE_OTHER): Payer: Medicaid Other | Admitting: Student

## 2019-04-09 ENCOUNTER — Telehealth: Payer: Self-pay | Admitting: Internal Medicine

## 2019-04-09 ENCOUNTER — Ambulatory Visit
Admission: RE | Admit: 2019-04-09 | Discharge: 2019-04-09 | Disposition: A | Discharge status: Home / Self Care | Payer: Medicaid Other | Source: Ambulatory Visit | Attending: Student | Admitting: Student

## 2019-04-09 DIAGNOSIS — T82110A Breakdown (mechanical) of cardiac electrode, initial encounter: Secondary | ICD-10-CM

## 2019-04-09 DIAGNOSIS — T82118A Breakdown (mechanical) of other cardiac electronic device, initial encounter: Secondary | ICD-10-CM | POA: Diagnosis not present

## 2019-04-09 LAB — CUP PACEART INCLINIC DEVICE CHECK
Date Time Interrogation Session: 20201231094724
Implantable Lead Implant Date: 20201112
Implantable Lead Location: 753860
Implantable Pulse Generator Implant Date: 20201112
Pulse Gen Serial Number: 111012702

## 2019-04-09 NOTE — Progress Notes (Signed)
ICD check in clinic. Abnormal device function due to presumed lead dislodgement.  15 VT episodes all appear oversensing. Pt received 3 ATP with 3 aborted shocks. Per Dr. Rayann Heman, All tachy therapies turned OFF.  All brady therapies turned OFF due to oversensing.  Pt offered lead revision today, but unavailable. Plan for lead revision 04/16/2019 with Dr. Rayann Heman. Will check CXR today to confirm lead dislodgement and to further assess position.  Pt knows if she has tachy-palpitations with lightheadedness, dizziness, near syncope, or any syncope to report to ED and to not drive herself.  Pt given pre-op instructions, reviewed risks and benefits, and set up for COVID testing.

## 2019-04-09 NOTE — Telephone Encounter (Signed)
Received Merlin alert for VF episode.  EGM shows oversensing.  Likely RV lead dislodgement. 2 VF episodes with aborted shocks. Device implanted for primary prevention. Called and discussed with patient. She is clear in her decision to avoid lead revision on the holiday.  She will have her husband drive her to the office.  Will turn HV therapies and brady off.  Will schedule lead revision in the next couple of weeks with Dr Rayann Heman.  Pt aware and agrees with plan. I advised that if she receives ICD shock it is likely inappropriate and most efficient place of care is the office.  Discussed with Oda Kilts, PA who will see her this morning and reprogram device.  Will also obtain CXR.  Chanetta Marshall, NP 04/09/2019 8:38 AM

## 2019-04-13 ENCOUNTER — Other Ambulatory Visit (HOSPITAL_COMMUNITY)
Admission: RE | Admit: 2019-04-13 | Discharge: 2019-04-13 | Disposition: A | Discharge status: Home / Self Care | Payer: Medicaid Other | Source: Ambulatory Visit | Attending: Internal Medicine | Admitting: Internal Medicine

## 2019-04-13 DIAGNOSIS — Z01812 Encounter for preprocedural laboratory examination: Secondary | ICD-10-CM | POA: Diagnosis not present

## 2019-04-13 DIAGNOSIS — Z20822 Contact with and (suspected) exposure to covid-19: Secondary | ICD-10-CM | POA: Diagnosis not present

## 2019-04-14 LAB — NOVEL CORONAVIRUS, NAA (HOSP ORDER, SEND-OUT TO REF LAB; TAT 18-24 HRS): SARS-CoV-2, NAA: NOT DETECTED

## 2019-04-16 ENCOUNTER — Ambulatory Visit (HOSPITAL_COMMUNITY): Payer: Medicaid Other

## 2019-04-16 ENCOUNTER — Other Ambulatory Visit: Payer: Self-pay

## 2019-04-16 ENCOUNTER — Ambulatory Visit (HOSPITAL_COMMUNITY)
Admission: RE | Disposition: A | Discharge status: Home / Self Care | Payer: Medicaid Other | Source: Home / Self Care | Attending: Internal Medicine

## 2019-04-16 ENCOUNTER — Ambulatory Visit (HOSPITAL_COMMUNITY)
Admission: RE | Admit: 2019-04-16 | Discharge: 2019-04-16 | Disposition: A | Discharge status: Home / Self Care | Payer: Medicaid Other | Attending: Internal Medicine | Admitting: Internal Medicine

## 2019-04-16 DIAGNOSIS — I428 Other cardiomyopathies: Secondary | ICD-10-CM | POA: Insufficient documentation

## 2019-04-16 DIAGNOSIS — Z95810 Presence of automatic (implantable) cardiac defibrillator: Secondary | ICD-10-CM

## 2019-04-16 DIAGNOSIS — I471 Supraventricular tachycardia: Secondary | ICD-10-CM | POA: Diagnosis not present

## 2019-04-16 DIAGNOSIS — Z006 Encounter for examination for normal comparison and control in clinical research program: Secondary | ICD-10-CM | POA: Insufficient documentation

## 2019-04-16 DIAGNOSIS — I493 Ventricular premature depolarization: Secondary | ICD-10-CM | POA: Diagnosis not present

## 2019-04-16 DIAGNOSIS — Z79899 Other long term (current) drug therapy: Secondary | ICD-10-CM | POA: Diagnosis not present

## 2019-04-16 DIAGNOSIS — D649 Anemia, unspecified: Secondary | ICD-10-CM | POA: Insufficient documentation

## 2019-04-16 DIAGNOSIS — I5022 Chronic systolic (congestive) heart failure: Secondary | ICD-10-CM | POA: Diagnosis not present

## 2019-04-16 DIAGNOSIS — Z793 Long term (current) use of hormonal contraceptives: Secondary | ICD-10-CM | POA: Diagnosis not present

## 2019-04-16 DIAGNOSIS — T82120A Displacement of cardiac electrode, initial encounter: Secondary | ICD-10-CM | POA: Diagnosis not present

## 2019-04-16 HISTORY — PX: LEAD REVISION/REPAIR: EP1213

## 2019-04-16 LAB — BASIC METABOLIC PANEL
Anion gap: 9 (ref 5–15)
BUN: 7 mg/dL (ref 6–20)
CO2: 22 mmol/L (ref 22–32)
Calcium: 9.3 mg/dL (ref 8.9–10.3)
Chloride: 105 mmol/L (ref 98–111)
Creatinine, Ser: 0.55 mg/dL (ref 0.44–1.00)
GFR calc Af Amer: >60 mL/min (ref >60)
GFR calc non Af Amer: >60 mL/min (ref >60)
Glucose, Bld: 95 mg/dL (ref 70–99)
Potassium: 4.8 mmol/L (ref 3.5–5.1)
Sodium: 136 mmol/L (ref 135–145)

## 2019-04-16 LAB — CBC
HCT: 38.4 % (ref 36.0–46.0)
Hemoglobin: 12.5 g/dL (ref 12.0–15.0)
MCH: 26.7 pg (ref 26.0–34.0)
MCHC: 32.6 g/dL (ref 30.0–36.0)
MCV: 81.9 fL (ref 80.0–100.0)
Platelets: 275 10*3/uL (ref 150–400)
RBC: 4.69 MIL/uL (ref 3.87–5.11)
RDW: 14.7 % (ref 11.5–15.5)
WBC: 6.1 10*3/uL (ref 4.0–10.5)
nRBC: 0 % (ref 0.0–0.2)

## 2019-04-16 LAB — PREGNANCY, URINE: Preg Test, Ur: NEGATIVE

## 2019-04-16 SURGERY — LEAD REVISION/REPAIR

## 2019-04-16 MED ORDER — MIDAZOLAM HCL 5 MG/5ML IJ SOLN
INTRAMUSCULAR | Status: AC
  Filled 2019-04-16: qty 5

## 2019-04-16 MED ORDER — FENTANYL CITRATE (PF) 100 MCG/2ML IJ SOLN
INTRAMUSCULAR | Status: DC | PRN
  Administered 2019-04-16: 25 ug via INTRAVENOUS
  Administered 2019-04-16 (×2): 12.5 ug via INTRAVENOUS

## 2019-04-16 MED ORDER — SODIUM CHLORIDE 0.9% FLUSH: 3.0000 mL | Freq: Two times a day (BID) | INTRAVENOUS | Status: DC

## 2019-04-16 MED ORDER — ACETAMINOPHEN 325 MG PO TABS
325.0000 mg | ORAL_TABLET | ORAL | Status: DC | PRN
  Filled 2019-04-16: qty 2

## 2019-04-16 MED ORDER — HYDROCODONE-ACETAMINOPHEN 5-325 MG PO TABS
ORAL_TABLET | ORAL | Status: AC
  Administered 2019-04-16: 18:00:00 1 via ORAL
  Filled 2019-04-16: qty 1

## 2019-04-16 MED ORDER — CEFAZOLIN SODIUM-DEXTROSE 2-4 GM/100ML-% IV SOLN
INTRAVENOUS | Status: AC
  Filled 2019-04-16: qty 100

## 2019-04-16 MED ORDER — HYDROCODONE-ACETAMINOPHEN 5-325 MG PO TABS: 1.0000 | ORAL_TABLET | Freq: Once | ORAL | Status: AC

## 2019-04-16 MED ORDER — LIDOCAINE HCL (PF) 1 % IJ SOLN
INTRAMUSCULAR | Status: AC
  Filled 2019-04-16: qty 60

## 2019-04-16 MED ORDER — HEPARIN (PORCINE) IN NACL 1000-0.9 UT/500ML-% IV SOLN
INTRAVENOUS | Status: DC | PRN
  Administered 2019-04-16: 500 mL

## 2019-04-16 MED ORDER — LIDOCAINE HCL 1 % IJ SOLN
INTRAMUSCULAR | Status: AC
  Filled 2019-04-16: qty 60

## 2019-04-16 MED ORDER — MIDAZOLAM HCL 5 MG/5ML IJ SOLN
INTRAMUSCULAR | Status: DC | PRN
  Administered 2019-04-16: 2 mg via INTRAVENOUS
  Administered 2019-04-16 (×2): 1 mg via INTRAVENOUS

## 2019-04-16 MED ORDER — CHLORHEXIDINE GLUCONATE 4 % EX LIQD: 4.0000 "application " | Freq: Once | CUTANEOUS | Status: DC

## 2019-04-16 MED ORDER — IOHEXOL 350 MG/ML SOLN
INTRAVENOUS | Status: DC | PRN
  Administered 2019-04-16: 15 mL via INTRAVENOUS

## 2019-04-16 MED ORDER — SODIUM CHLORIDE 0.9 % IV SOLN: INTRAVENOUS | Status: DC

## 2019-04-16 MED ORDER — SODIUM CHLORIDE 0.9 % IV SOLN
80.0000 mg | INTRAVENOUS | Status: AC
  Administered 2019-04-16: 80 mg

## 2019-04-16 MED ORDER — HEPARIN (PORCINE) IN NACL 1000-0.9 UT/500ML-% IV SOLN
INTRAVENOUS | Status: AC
  Filled 2019-04-16: qty 500

## 2019-04-16 MED ORDER — SODIUM CHLORIDE 0.9 % IV SOLN
INTRAVENOUS | Status: AC
  Filled 2019-04-16: qty 2

## 2019-04-16 MED ORDER — LIDOCAINE HCL (PF) 1 % IJ SOLN
INTRAMUSCULAR | Status: DC | PRN
  Administered 2019-04-16: 60 mL

## 2019-04-16 MED ORDER — ONDANSETRON HCL 4 MG/2ML IJ SOLN: 4.0000 mg | Freq: Four times a day (QID) | INTRAMUSCULAR | Status: DC | PRN

## 2019-04-16 MED ORDER — CEFAZOLIN SODIUM-DEXTROSE 2-4 GM/100ML-% IV SOLN
2.0000 g | INTRAVENOUS | Status: AC
  Administered 2019-04-16: 16:00:00 2 g via INTRAVENOUS

## 2019-04-16 MED ORDER — HYDROCODONE-ACETAMINOPHEN 5-325 MG PO TABS
ORAL_TABLET | ORAL | Status: AC
  Administered 2019-04-16: 1 via ORAL
  Filled 2019-04-16: qty 1

## 2019-04-16 MED ORDER — SODIUM CHLORIDE 0.9 % IV SOLN: 250.0000 mL | INTRAVENOUS | Status: DC | PRN

## 2019-04-16 MED ORDER — SODIUM CHLORIDE 0.9% FLUSH: 3.0000 mL | INTRAVENOUS | Status: DC | PRN

## 2019-04-16 MED ORDER — FENTANYL CITRATE (PF) 100 MCG/2ML IJ SOLN
INTRAMUSCULAR | Status: AC
  Filled 2019-04-16: qty 2

## 2019-04-16 SURGICAL SUPPLY — 7 items
CABLE SURGICAL S-101-97-12 (CABLE) ×2 IMPLANT
LEAD DURATA 7122-65CM (Lead) ×1 IMPLANT
PAD PRO RADIOLUCENT 2001M-C (PAD) ×2 IMPLANT
POUCH AIGIS-R ANTIBACT ICD (Mesh General) ×2 IMPLANT
POUCH AIGIS-R ANTIBACT ICD LRG (Mesh General) IMPLANT
SHEATH 7FR PRELUDE SNAP 13 (SHEATH) ×1 IMPLANT
TRAY PACEMAKER INSERTION (PACKS) ×2 IMPLANT

## 2019-04-16 NOTE — Discharge Instructions (Signed)
After Your Lead Revision  . Do not lift your arm above shoulder height for 1 week after your procedure. After 7 days, you may progress as below.     Thursday April 23, 2019  Friday April 24, 2019 Saturday April 25, 2019 Sunday April 26, 2019   . Do not lift, push, pull, or carry anything over 10 pounds with the affected arm until 6 weeks (Thursday May 28, 2019) after your procedure.   . Do not drive until your wound check or until instructed by your healthcare provider that you are safe to do so.   . Monitor your surgical site for redness, swelling, and drainage. Call the device clinic at [phone] if you experience these symptoms or fever/chills.  . If your incision is sealed with Steri-strips or staples. You may shower 7 days after your procedure and wash your incision with soap and water as long as it is healed. If your incision is closed with Dermabond/Surgical glue. You may shower 1 day after your pacemaker implant and wash your incision with soap and water. Avoid lotions, ointments, or perfumes over your incision until it is well-healed.  . You may use a hot tub or a pool AFTER your wound check appointment if the incision is completely closed.   . Your cardiac device may be MRI compatible. We will discuss this at your office visit/Wound check  . Remote monitoring is used to monitor your cardiac device from home. This monitoring is scheduled every 91 days by our office. It allows [place] to keep an eye on the functioning of your device to ensure it is working properly. You will routinely see your Electrophysiologist annually (more often if necessary).   Implantable Cardiac Device Lead Replacement, Care After This sheet gives you information about how to care for yourself after your procedure. Your health care provider may also give you more specific instructions. If you have problems or questions, contact your health care provider. What can I expect after the procedure? After  your procedure, it is common to have:  Mild discomfort at the incision site.  A small amount of drainage or bleeding at the incision site. This is usually no more than a spot. Follow these instructions at home: Incision care         Follow instructions from your health care provider about how to take care of your incision. Make sure you: ? Leave stitches (sutures), skin glue, or adhesive strips in place. These skin closures may need to stay in place for 2 weeks or longer. If adhesive strip edges start to loosen and curl up, you may trim the loose edges. Do not remove adhesive strips completely unless your health care provider tells you to do that.  Check your incision area every day for signs of infection. Check for: ? More redness, swelling, or pain. ? More fluid or blood. ? Warmth. ? Pus or a bad smell. Electric and [employer] cell phones should be kept 12 inches (30 cm) away from the cardiac device when they are on. When talking on a cell phone, use the ear on the opposite side of your cardiac device.  Do not place a cell phone in a pocket next to the cardiac device.  Household appliances do not interfere with modern-day cardiac device. Medicines  Take over-the-counter and prescription medicines only as told by your health care provider. General instructions  Do not raise the arm on the side of your procedure higher than your shoulder for  at least 7 days. Except for this restriction, continue to use your arm as normal to prevent problems.  Do not take baths, swim, or use a hot tub until your health care provider says it is okay to do so. You may shower as directed by your health care provider.  Do not lift anything that is heavier than 10 lb (4.5 kg) for 6 weeks or the limit that your health care provider tells you, until he or she says that it is safe.  Return to your normal activities after 2 weeks, or as told by your health care provider. Ask your health care  provider what activities are safe for you.  Keep all follow-up visits as told by your health care provider. This is important. Contact a health care provider if:  You have more redness, swelling, or pain around your incision.  You have more fluid or blood coming from your incision.  Your incision feels warm to the touch.  You have pus or a bad smell coming from your incision.  You have a fever.  The arm or hand on the side of the cardiac device becomes swollen.  The symptoms you had before your procedure are not getting better. Get help right away if:  You develop chest pain.  You feel like you will faint.  You feel light-headed.  You faint. Summary  Check your incision area every day for signs of infection, such as more fluid or blood. A small amount of drainage or bleeding at the incision site is normal.  Do not raise the arm on the side of your procedure higher than your shoulder for at least 5 days, or as long as directed by your health care provider.  Digital cell phones should be kept 12 inches (30 cm) away from the cardiac device when they are on. When talking on a cell phone, use the ear on the opposite side of your cardiac device.  If the symptoms that led to having your lead replaced are not getting better, contact your health care provider. This information is not intended to replace advice given to you by your health care provider. Make sure you discuss any questions you have with your health care provider.

## 2019-04-16 NOTE — Interval H&P Note (Signed)
History and Physical Interval Note:  04/16/2019 1:57 PM Her RV lead has dislodged.  Therapies were turned off.  We will therefore plan to proceed with lead revision at this time.   Tara Bradshaw  has presented today for surgery, with the diagnosis of lead failure.  The various methods of treatment have been discussed with the patient and family. After consideration of risks, benefits and other options for treatment, the patient has consented to  Procedure(s): LEAD REVISION/REPAIR (N/A) as a surgical intervention.  The patient's history has been reviewed, patient examined, no change in status, stable for surgery.  I have reviewed the patient's chart and labs.  Questions were answered to the patient's satisfaction.     Tara Bradshaw

## 2019-04-16 NOTE — Progress Notes (Signed)
Dr Rayann Heman in to see client

## 2019-04-16 NOTE — Progress Notes (Signed)
After IV discontinued, client up to wheelchair and c/o feeling faint; back to bed; b/p 105/64 hr 60 and o2 sat 97 on room air; client states feels better now; Dr Rayann Heman notified of above and no new orders, client states no chest pain; states site pain decreased to 5/10 and per Dr Rayann Heman ok to d/c home

## 2019-04-16 NOTE — Progress Notes (Signed)
Client c/o continued surgical site pain 8/10; Dr Rayann Heman notified and order noted and medication given; Dr Rayann Heman notified of cxr results; Okay to d/c home at 8pm

## 2019-04-17 MED FILL — Gentamicin Sulfate Inj 40 MG/ML: INTRAMUSCULAR | Qty: 80 | Status: AC

## 2019-04-17 MED FILL — Lidocaine HCl Local Inj 1%: INTRAMUSCULAR | Qty: 60 | Status: AC

## 2019-04-17 MED FILL — Lidocaine HCl Local Preservative Free (PF) Inj 1%: INTRAMUSCULAR | Qty: 30 | Status: AC

## 2019-04-28 ENCOUNTER — Ambulatory Visit (INDEPENDENT_AMBULATORY_CARE_PROVIDER_SITE_OTHER): Payer: Medicaid Other | Admitting: *Deleted

## 2019-04-28 ENCOUNTER — Other Ambulatory Visit: Payer: Self-pay

## 2019-04-28 DIAGNOSIS — I429 Cardiomyopathy, unspecified: Secondary | ICD-10-CM | POA: Diagnosis not present

## 2019-04-28 NOTE — Patient Instructions (Signed)
Call office if you have any drainage, redness, or swelling at wound site.

## 2019-05-14 LAB — CUP PACEART INCLINIC DEVICE CHECK
Brady Statistic RV Percent Paced: 0 %
Date Time Interrogation Session: 20210119115035
Implantable Lead Implant Date: 20210106190000
Implantable Lead Location: 753860
Implantable Pulse Generator Implant Date: 20201111190000
Lead Channel Pacing Threshold Amplitude: 0.75 V
Lead Channel Pacing Threshold Pulse Width: 0.5 ms
Lead Channel Sensing Intrinsic Amplitude: 11.9 mV
Pulse Gen Serial Number: 111012702

## 2019-05-14 NOTE — Progress Notes (Signed)
Wound check appointment. Steri-strips removed. Wound without redness or edema. Incision edges approximated, wound well healed. Normal device function. Thresholds, sensing, and impedances consistent with implant measurements. Device programmed at 3.5V for extra safety margin until 3 month visit. Histogram distribution appropriate for patient and level of activity. No ventricular arrhythmias noted. Patient educated about wound care, arm mobility, lifting restrictions, shock plan. Next remote transmission 06/18/19 and every 3 months there after. ROV with Dr Rayann Heman 07/22/19.

## 2019-05-15 ENCOUNTER — Telehealth: Payer: Self-pay

## 2019-05-15 NOTE — Telephone Encounter (Signed)
Patient referred to [facility] clinic by Dr Rayann Heman.  Spoke with patient and provided ICM intro.  She agreed to monthly follow up.  She reports feeling like her voice is a raspy today and wondered if it could be fluid.  She sent remote transmission and suggest fluid levels are normal.  She reports weight is 264 lbs today but does not usually weigh at home.  Encouraged to weigh every morning before eating or drinking.   She voices no other complaints today.  ICM remote transmission scheduled for 06/01/2019. Provided ICM direct number and encouraged to call if experiencing any fluid symptoms.

## 2019-05-27 DIAGNOSIS — R0602 Shortness of breath: Secondary | ICD-10-CM | POA: Diagnosis not present

## 2019-05-27 DIAGNOSIS — R000 Tachycardia, unspecified: Secondary | ICD-10-CM | POA: Diagnosis not present

## 2019-05-27 DIAGNOSIS — R0689 Other abnormalities of breathing: Secondary | ICD-10-CM | POA: Diagnosis not present

## 2019-05-27 DIAGNOSIS — I491 Atrial premature depolarization: Secondary | ICD-10-CM | POA: Diagnosis not present

## 2019-05-27 DIAGNOSIS — R55 Syncope and collapse: Secondary | ICD-10-CM | POA: Diagnosis not present

## 2019-05-28 ENCOUNTER — Encounter: Payer: Medicaid Other | Admitting: Internal Medicine

## 2019-05-29 ENCOUNTER — Ambulatory Visit (INDEPENDENT_AMBULATORY_CARE_PROVIDER_SITE_OTHER): Payer: Medicaid Other | Admitting: *Deleted

## 2019-05-29 DIAGNOSIS — I429 Cardiomyopathy, unspecified: Secondary | ICD-10-CM

## 2019-05-29 LAB — CUP PACEART REMOTE DEVICE CHECK
Battery Remaining Longevity: 93 mo
Battery Remaining Percentage: 94 %
Battery Voltage: 2.99 V
Brady Statistic RV Percent Paced: 0 %
Date Time Interrogation Session: 20210219020008
HighPow Impedance: 60 Ohm
Implantable Lead Implant Date: 20210107
Implantable Lead Location: 753860
Implantable Pulse Generator Implant Date: 20201112
Lead Channel Impedance Value: 340 Ohm
Lead Channel Pacing Threshold Amplitude: 0.75 V
Lead Channel Pacing Threshold Pulse Width: 0.5 ms
Lead Channel Sensing Intrinsic Amplitude: 11.9 mV
Lead Channel Setting Pacing Amplitude: 3.5 V
Lead Channel Setting Pacing Pulse Width: 0.5 ms
Lead Channel Setting Sensing Sensitivity: 0.5 mV
Pulse Gen Serial Number: 111012702

## 2019-05-29 NOTE — Progress Notes (Signed)
ICD Remote  

## 2019-06-01 ENCOUNTER — Ambulatory Visit (INDEPENDENT_AMBULATORY_CARE_PROVIDER_SITE_OTHER): Payer: Medicaid Other

## 2019-06-01 DIAGNOSIS — I5022 Chronic systolic (congestive) heart failure: Secondary | ICD-10-CM | POA: Diagnosis not present

## 2019-06-01 DIAGNOSIS — Z95810 Presence of automatic (implantable) cardiac defibrillator: Secondary | ICD-10-CM

## 2019-06-02 ENCOUNTER — Telehealth: Payer: Self-pay

## 2019-06-02 NOTE — Telephone Encounter (Signed)
Spoke with patient.  She is new to [facility] program.   Heart Failure questions reviewed.    Pt voiceed symptoms for past week of:  Nausea  Weakness  Generally not feeling   She reports no changes in medications and unsure what is causing the symptoms or if they are heart related     Corvue thoracic impedance above baseline suggesting possible dryness for the past few weeks.    Prescribed:   Furosemide 40 mg take 1 tablet by mouth twice a day.   Potassium 20 mEq take 1 tablet daily.   Sent to Dr Haroldine Laws for review/recommendations.    Follow-up plan: ICM clinic phone appointment on 06/10/2019 to recheck fluid levels.     Will send message to HF clinic scheduler to call patient for routine appt with Dr Haroldine Laws since last visit was 01/15/2019.      3 month ICM trend: 06/01/2019    1 Year ICM trend:

## 2019-06-02 NOTE — Progress Notes (Signed)
EPIC Encounter for ICM Monitoring  Patient Name: Tara Bradshaw is a 34 y.o. female Date: 06/02/2019 Primary Care Physican: [facility], [facility] Primary Cardiologist: [place] Electrophysiologist: [place] 06/02/2019 Weight: 264 lbs          1st ICM remote transmission since 05/15/19 enrollment. Heart Failure questions reviewed.    Pt voiceed symptoms for past week of:  Nausea  Weakness  Generally not feeling   She reports no changes in medications and unsure what is causing the symptoms.     Corvue thoracic impedance above baseline suggesting possible dryness for the past few weeks.    Prescribed:   Furosemide 40 mg take 1 tablet by mouth twice a day.   Potassium 20 mEq take 1 tablet daily.   Recommendations:  Phone note sent to Dr Haroldine Laws for review/recommendations.    Follow-up plan: ICM clinic phone appointment on 06/10/2019 to recheck fluid levels.   91 day device clinic remote transmission 08/28/2019.  Office visit with Dr Rayann Heman 07/22/2019.     Will send message to HF clinic scheduler to call patient for routine appt with Dr Haroldine Laws since last visit was 01/15/2019.    Copy of ICM check sent to Dr. Rayann Heman.   3 month ICM trend: 06/01/2019    1 Year ICM trend:       [name], RN 06/02/2019 3:03 PM

## 2019-06-05 NOTE — Progress Notes (Signed)
Attempted call to patient and left message to return call.  °

## 2019-06-05 NOTE — Progress Notes (Signed)
Attempted call to patient and left message.

## 2019-06-05 NOTE — Telephone Encounter (Signed)
Attempted call to patient.  She communicated with Dr Jackalyn Lombard nurse via Deloris Ping regarding symptoms.  Latest remote transmission reviewed by Trena Platt, RN device clinic and was determined normal.  Will recheck fluid levels 06/10/19.

## 2019-06-05 NOTE — Progress Notes (Signed)
Tara Bradshaw, device RN reviewed latest remote transmission and is normal. Patient communicated via mychart with Willeen Cass, RN for Dr Rayann Heman regarding her symptoms and recommended she schedule a visit with Dr Haroldine Laws.  Message sent to HF clinic scheduler to call patient for an appointment.

## 2019-06-10 ENCOUNTER — Ambulatory Visit (INDEPENDENT_AMBULATORY_CARE_PROVIDER_SITE_OTHER): Payer: Medicaid Other

## 2019-06-10 DIAGNOSIS — Z95810 Presence of automatic (implantable) cardiac defibrillator: Secondary | ICD-10-CM

## 2019-06-10 DIAGNOSIS — I5022 Chronic systolic (congestive) heart failure: Secondary | ICD-10-CM

## 2019-06-12 NOTE — Progress Notes (Signed)
EPIC Encounter for ICM Monitoring  Patient Name: Tara Bradshaw is a 34 y.o. female Date: 06/12/2019 Primary Care Physican: Network, [facility] Primary Cardiologist: [place] Electrophysiologist: Allred 06/02/2019 Weight: 264 lbs         Heart Failure questions reviewed.    Pt voiced symptoms for past 2 weeks off and on of:  Nausea  Weakness  Generally not feeling   She reports no changes in medications and unsure what is causing the symptoms.     Corvue thoracic impedance suggesting possible fluid accumulation starting 2/27 and returning to baseline normal.  Prescribed:   Furosemide 40 mg take 1 tablet by mouth twice a day.   Potassium 20 mEq take 1 tablet daily.   Labs: 04/16/2019 Creatinine 0.55, BUN 7, Potassium 4.8, Sodium 136, GFR >60 03/25/2019 Creatinine 0.71, BUN 8, Potassium 3.2, Sodium 140, GFR >60  03/16/2019 Creatinine 0.75, BUN 6, Potassium 3.7, Sodium 138, GFR >60  A complete set of results can be found in Results Review.  Recommendations:  Advised to take extra Furosemide tablet x 1 day and then return to prescribed dosage.  Follow-up plan: ICM clinic phone appointment on 07/06/2019.   91 day device clinic remote transmission 08/28/2019.  Office visit with Dr Rayann Heman 07/22/2019.     Pt missed call from HF clinic scheduler and attempted to return call.  She needs routine appt with Dr Haroldine Laws since last visit was 01/15/2019.    Copy of ICM check sent to Dr. Rayann Heman and Dr Haroldine Laws.   3 month ICM trend: 3/532021    1 Year ICM trend:       [name], RN 06/12/2019 1:43 PM

## 2019-06-25 ENCOUNTER — Encounter (HOSPITAL_COMMUNITY): Payer: Medicaid Other

## 2019-06-26 ENCOUNTER — Telehealth: Payer: Self-pay | Admitting: Internal Medicine

## 2019-06-26 NOTE — Telephone Encounter (Signed)
Transmission reviewed from today and looks WNL, called and notified patient. Patient informed to call office back with any questions or concerns M-F, 8AM-5PM.
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
## 2019-06-26 NOTE — Telephone Encounter (Signed)
Transmission received 06-26-2019
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
## 2019-07-06 ENCOUNTER — Ambulatory Visit (INDEPENDENT_AMBULATORY_CARE_PROVIDER_SITE_OTHER): Payer: Medicaid Other

## 2019-07-06 DIAGNOSIS — I5022 Chronic systolic (congestive) heart failure: Secondary | ICD-10-CM | POA: Diagnosis not present

## 2019-07-06 DIAGNOSIS — Z95810 Presence of automatic (implantable) cardiac defibrillator: Secondary | ICD-10-CM | POA: Diagnosis not present
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
## 2019-07-20 ENCOUNTER — Other Ambulatory Visit: Payer: Self-pay

## 2019-07-20 ENCOUNTER — Ambulatory Visit (HOSPITAL_COMMUNITY)
Admission: RE | Admit: 2019-07-20 | Discharge: 2019-07-20 | Disposition: A | Discharge status: Home / Self Care | Payer: Medicaid Other | Source: Ambulatory Visit | Attending: Cardiology | Admitting: Cardiology

## 2019-07-20 ENCOUNTER — Encounter (HOSPITAL_COMMUNITY): Payer: Self-pay

## 2019-07-20 VITALS — BP 110/84 | HR 75 | Wt 265.0 lb

## 2019-07-20 DIAGNOSIS — G4733 Obstructive sleep apnea (adult) (pediatric): Secondary | ICD-10-CM | POA: Diagnosis not present

## 2019-07-20 DIAGNOSIS — I428 Other cardiomyopathies: Secondary | ICD-10-CM

## 2019-07-20 DIAGNOSIS — Z793 Long term (current) use of hormonal contraceptives: Secondary | ICD-10-CM | POA: Insufficient documentation

## 2019-07-20 DIAGNOSIS — Z20822 Contact with and (suspected) exposure to covid-19: Secondary | ICD-10-CM | POA: Insufficient documentation

## 2019-07-20 DIAGNOSIS — I493 Ventricular premature depolarization: Secondary | ICD-10-CM | POA: Insufficient documentation

## 2019-07-20 DIAGNOSIS — K219 Gastro-esophageal reflux disease without esophagitis: Secondary | ICD-10-CM | POA: Diagnosis not present

## 2019-07-20 DIAGNOSIS — I110 Hypertensive heart disease with heart failure: Secondary | ICD-10-CM | POA: Insufficient documentation

## 2019-07-20 DIAGNOSIS — Z95810 Presence of automatic (implantable) cardiac defibrillator: Secondary | ICD-10-CM | POA: Diagnosis not present

## 2019-07-20 DIAGNOSIS — Z79899 Other long term (current) drug therapy: Secondary | ICD-10-CM | POA: Insufficient documentation

## 2019-07-20 DIAGNOSIS — I5022 Chronic systolic (congestive) heart failure: Secondary | ICD-10-CM

## 2019-07-20 LAB — COMPREHENSIVE METABOLIC PANEL
ALT: 17 U/L (ref 0–44)
AST: 16 U/L (ref 15–41)
Albumin: 3.7 g/dL (ref 3.5–5.0)
Alkaline Phosphatase: 55 U/L (ref 38–126)
Anion gap: 9 (ref 5–15)
BUN: 8 mg/dL (ref 6–20)
CO2: 22 mmol/L (ref 22–32)
Calcium: 9 mg/dL (ref 8.9–10.3)
Chloride: 106 mmol/L (ref 98–111)
Creatinine, Ser: 0.63 mg/dL (ref 0.44–1.00)
GFR calc Af Amer: >60 mL/min (ref >60)
GFR calc non Af Amer: >60 mL/min (ref >60)
Glucose, Bld: 85 mg/dL (ref 70–99)
Potassium: 3.8 mmol/L (ref 3.5–5.1)
Sodium: 137 mmol/L (ref 135–145)
Total Bilirubin: 0.5 mg/dL (ref 0.3–1.2)
Total Protein: 6.8 g/dL (ref 6.5–8.1)

## 2019-07-20 LAB — TSH: TSH: 0.582 u[IU]/mL (ref 0.350–4.500)

## 2019-07-20 LAB — T4, FREE: Free T4: 0.79 ng/dL (ref 0.61–1.12)

## 2019-07-20 LAB — DIGOXIN LEVEL: Digoxin Level: <0.2 ng/mL — ABNORMAL LOW (ref 0.8–2.0)

## 2019-07-20 NOTE — Patient Instructions (Signed)
No medication changes today!  Labs today We will only contact you if something comes back abnormal or we need to make some changes. Otherwise no news is good news!  Your physician has recommended that you have a cardiopulmonary stress test (CPX). CPX testing is a non-invasive measurement of heart and lung function. It replaces a traditional treadmill stress test. This type of test provides a tremendous amount of information that relates not only to your present condition but also for future outcomes. This test combines measurements of you ventilation, respiratory gas exchange in the lungs, electrocardiogram (EKG), blood pressure and physical response before, during, and following an exercise protocol.  Your physician recommends that you schedule a follow-up appointment in: 6 weeks with Dr Haroldine Laws.  Please call office at [phone] option 2 if you have any questions or concerns.   At the [facility], you and your health needs are our priority. As part of our continuing mission to provide you with exceptional heart care, we have created designated Provider Care Teams. These Care Teams include your primary Cardiologist (physician) and Advanced Practice Providers (APPs- Physician Assistants and Nurse Practitioners) who all work together to provide you with the care you need, when you need it.   You may see any of the following providers on your designated Care Team at your next follow up: Marland Kitchen Dr Glori Bickers . Dr Loralie Champagne . Darrick Grinder, NP . Lyda Jester, PA . Audry Riles, PharmD   Please be sure to bring in all your medications bottles to every appointment.

## 2019-07-20 NOTE — Progress Notes (Signed)
Advanced Heart Failure Clinic Note    Date:  07/20/2019   ID:  Tara Bradshaw, DOB [birth year], MRN [id number]  Location: Home  Provider location: [facility] [place] [facility] Type of Visit: Established patient  PCP:  Network, [facility]  Cardiologist:  No primary care provider on file. Primary HF: Dr. Haroldine Laws  Chief Complaint: f/u for Chronic Systolic Heart Failure    History of Present Illness:  Tara Bradshaw is a 34 y/o woman with h/o mild HTN who was admitted in 3/20 with acute systolic HF with EF 0000000 in setting of recent viral illness and 71-month post-partum status. COVID testing negative. Did not undergo cath as felt to be NICM   Had Vanita Ingles, RN make a nurse visit on 4/23 and ReDS was 44%. Lasix increased to 40 bid for 2 days then back down to 40 daily. Had to apply for [facility] assistance program to get more refills. BP was 132/62 at that visit.   ZioPatch 8/20 NSR. Occasional PVCs (3.5%) 9-beat run NSVT   Had return visit w/ Dr. Haroldine Laws 01/15/19. Endorsed fatigue after starting carvedilol. Struggled w/ ADLs. No edema or PND. Noted mild orthopnea and symptoms c/w vertigo. Echo was repeated at that visit and EF was 25-30% (read as 30-35%). She was set up for [facility] on 10/15 which showed severe NICM EF 20%, Normal coronaries, Well compensated hemodynamics with high cardiac output. No evidence of intracardiac shunting. Continuation of medical therapy was recommended. It was also recommended to consider CPX testing of symptoms were to persists.   She presents back to clinic today for f/u. Continues to endorse exertional dyspnea/ fatigue. NYHA Class II-III. No resting dyspnea. Corvue impedence monitored by EP clinic and has been stable. Unable to get device interrogation today due to office equipment error. However she is euvolemic on exam and by ReDs Vest, 30%. Reports full med compliance but has reduced Bidil down from tid to once daily. She takes  this at night. Reports that she did not tolerate tid dosing to due frequent HAs and dizziness w/ low BP. BP is stable and well controlled today, [id number] systolic. Admits to poor compliance w/ CPAP but will try to improve usage.    Echo 10/20: EF 25-30% (read as 30-35%)    Past Medical History:  Diagnosis Date  . Anemia    CHRONIC  . Chronic systolic dysfunction of left ventricle   . GERD (gastroesophageal reflux disease)    WITH PREGNANCY  . Nonischemic cardiomyopathy ([place])   . Pneumonia    x 1  . PVC's (premature ventricular contractions)    Past Surgical History:  Procedure Laterality Date  . DILATION AND EVACUATION N/A 11/30/2016   Procedure: DILATATION AND EVACUATION;  Surgeon: Donnamae Jude, MD;  Location: [facility];  Service: Gynecology;  Laterality: N/A;  . ICD IMPLANT N/A 02/19/2019   Procedure: ICD IMPLANT;  Surgeon: Thompson Grayer, MD;  Location: [place] [facility];  Service: Cardiovascular;  Laterality: N/A;  . LEAD REVISION/REPAIR N/A 04/16/2019   Procedure: LEAD REVISION/REPAIR;  Surgeon: Thompson Grayer, MD;  Location: [place] [facility];  Service: Cardiovascular;  Laterality: N/A;  . RIGHT/LEFT HEART CATH AND CORONARY ANGIOGRAPHY N/A 01/22/2019   Procedure: RIGHT/LEFT HEART CATH AND CORONARY ANGIOGRAPHY;  Surgeon: Jolaine Artist, MD;  Location: [place] [facility];  Service: Cardiovascular;  Laterality: N/A;  . WISDOM TOOTH EXTRACTION     NO ANESTHESIA     Current Outpatient Medications  Medication Sig  Dispense Refill  . acetaminophen (TYLENOL) 325 MG tablet Take 650 mg by mouth every 6 (six) hours as needed for mild pain or headache.    Marland Kitchen amiodarone (PACERONE) 200 MG tablet Take 1 tablet (200 mg total) by mouth daily. 90 tablet 0  . carvedilol (COREG) 6.25 MG tablet Take 1 tablet (6.25 mg total) by mouth 2 (two) times daily. 180 tablet 3  . digoxin (LANOXIN) 0.125 MG tablet Take 1 tablet (0.125 mg total) by mouth daily. 90 tablet 0  . furosemide (LASIX) 40 MG tablet  Take 1 tablet (40 mg total) by mouth 2 (two) times daily. 60 tablet 6  . isosorbide-hydrALAZINE (BIDIL) 20-37.5 MG tablet Take 1 tablet by mouth daily.    Marland Kitchen levonorgestrel (MIRENA) 20 MCG/24HR IUD 1 each by Intrauterine route once.    . meclizine (ANTIVERT) 25 MG tablet Take 1 tablet (25 mg total) by mouth 3 (three) times daily as needed for dizziness. (Patient taking differently: Take 25 mg by mouth 3 (three) times daily. ) 15 tablet 0  . potassium chloride SA (K-DUR) 20 MEQ tablet Take 1 tablet (20 mEq total) by mouth daily. 90 tablet 0  . sacubitril-valsartan (ENTRESTO) 97-103 MG Take 1 tablet by mouth 2 (two) times daily. 60 tablet 6  . spironolactone (ALDACTONE) 25 MG tablet Take 1 tablet (25 mg total) by mouth daily. 30 tablet 6   No current facility-administered medications for this encounter.    Allergies:   Patient has no known allergies.   Social History:  The patient  reports that she has never smoked. She has never used smokeless tobacco. She reports that she does not drink alcohol or use drugs.   Family History:  The patient's family history includes Asthma in her mother; Cancer in her paternal grandmother; Cancer (age of onset: 27) in her maternal aunt; Epilepsy in her mother; Hypertension in her maternal grandmother.   ROS:  Please see the history of present illness.   All other systems are personally reviewed and negative.   Vitals:   07/20/19 0842  BP: 110/84  Pulse: 75  SpO2: 98%    PHYSICAL EXAM: ReDS Clip 20% General:  Well appearing obese young AAF. No respiratory difficulty HEENT: normal Neck: supple. no JVD. Carotids 2+ bilat; no bruits. No lymphadenopathy or thyromegaly appreciated. Cor: PMI nondisplaced. Regular rate & rhythm. No rubs, gallops or murmurs. Lungs: clear Abdomen: obese but soft, nontender, nondistended. No hepatosplenomegaly. No bruits or masses. Good bowel sounds. Extremities: no cyanosis, clubbing, rash, edema Neuro: alert & oriented x 3,  cranial nerves grossly intact. moves all 4 extremities w/o difficulty. Affect pleasant.    Recent Labs: 01/15/2019: ALT 19; TSH 0.465 03/16/2019: B Natriuretic Peptide 207.8 03/25/2019: Magnesium 1.8 04/16/2019: BUN 7; Creatinine, Ser 0.55; Hemoglobin 12.5; Platelets 275; Potassium 4.8; Sodium 136  Personally reviewed   Wt Readings from Last 3 Encounters:  07/20/19 120.2 kg  04/16/19 114.3 kg  04/06/19 117.3 kg      ASSESSMENT AND PLAN:  1. Acute systolic HF - Diagnosed 0000000. EchoLVEF 25% with moderate RV dysfunction - NICM. Either viral (had URI in week prior to admission and respiratory panel + rhinovirus) or post-partum or PVC cardiomyopathy. [place] 10/20 showed normal cors. RHC showed well compensated hemodynamics with high cardiac output. No evidence of intracardiac shunting.  - Repeat echo 10/20 EF 25-30%.  - Now has ICD followed by Dr. Rayann Heman.   - Volume status good today. ReDs Clip 30%, but remains symptomatic w/ NYHA Class II-III  symptoms.  - Will arrange CPX for further assessment  - Continue lasix 40 bid - Continue entresto to 97/103. On contraception (Mirena) - Continue digoxin 0.125. check dig level today  - Continue spiro 12.5 - Continue Bidil 1 tab daily (only able to tolerate once daily dosing. Takes at night. Intolerant to tid dosing due to HAs and dizziness/ low BP) - Continue carvedilol 3.125 bid  - Not candidate for cMRI due to size - Consider future addition of a SGLT2i (will hold off for now given euvolemic status, soft BP and difficulties tolerating tid bidil due to "dizziness" w/ low BP concerning for orthostatic hypotension).   2. OSA - Had WatchPat study 07/30/18 has moderate OSA (AHI 15.3). - admits to poor compliance w/ CPAP - we discussed importance of strict compliance w/ CPAP given HF and PVC history.  - she will work to improve compliance  3. PVCs/Palpitations - In hospital was averaging about 8-10 PVCs per minute. May be contributing to LV  dysfunction - ZioPatch AT done. NSR one 9-beat run NSVT. PVC 3.5% - Continue low dose amio 200 daily for suppression - Check TFT + HFTs today - Advised to get annual eye exams   4. Vertigo - has improved w/ meclizine   Signed, Lyda Jester, PA-C  07/20/2019 8:48 AM  Advanced Heart Failure Clinic [facility] [place] [address] [phone] (office) [phone] (fax)

## 2019-07-20 NOTE — Progress Notes (Signed)
ReDS Vest / Clip - 07/20/19 0800      ReDS Vest / Clip   Station Marker  B    Ruler Value  45    ReDS Value Range  Low volume    ReDS Actual Value  30    Anatomical Comments  sitting

## 2019-07-21 ENCOUNTER — Telehealth: Payer: Self-pay

## 2019-07-21 LAB — T3, FREE: T3, Free: 3.3 pg/mL (ref 2.0–4.4)

## 2019-07-22 ENCOUNTER — Telehealth (INDEPENDENT_AMBULATORY_CARE_PROVIDER_SITE_OTHER): Payer: Medicaid Other | Admitting: Internal Medicine

## 2019-07-22 DIAGNOSIS — I428 Other cardiomyopathies: Secondary | ICD-10-CM

## 2019-07-22 NOTE — Progress Notes (Signed)
No show for appointment  Please reschedule for in office visit

## 2019-07-27 ENCOUNTER — Encounter (HOSPITAL_COMMUNITY): Payer: Self-pay

## 2019-07-31 ENCOUNTER — Other Ambulatory Visit (HOSPITAL_COMMUNITY): Payer: Medicaid Other

## 2019-08-04 ENCOUNTER — Encounter (HOSPITAL_COMMUNITY): Payer: Medicaid Other

## 2019-08-07 ENCOUNTER — Telehealth (INDEPENDENT_AMBULATORY_CARE_PROVIDER_SITE_OTHER): Payer: Medicaid Other | Admitting: Internal Medicine

## 2019-08-07 ENCOUNTER — Encounter: Payer: Self-pay | Admitting: Internal Medicine

## 2019-08-07 ENCOUNTER — Other Ambulatory Visit: Payer: Self-pay

## 2019-08-07 ENCOUNTER — Telehealth: Payer: Self-pay

## 2019-08-07 VITALS — Ht 62.0 in | Wt 260.0 lb

## 2019-08-07 DIAGNOSIS — I428 Other cardiomyopathies: Secondary | ICD-10-CM

## 2019-08-07 DIAGNOSIS — I5022 Chronic systolic (congestive) heart failure: Secondary | ICD-10-CM

## 2019-08-07 DIAGNOSIS — I493 Ventricular premature depolarization: Secondary | ICD-10-CM

## 2019-08-07 MED ORDER — AMIODARONE HCL 200 MG PO TABS: 100.0000 mg | ORAL_TABLET | Freq: Every day | ORAL | 3 refills | Status: DC

## 2019-08-07 NOTE — Progress Notes (Signed)
Electrophysiology TeleHealth Note   Due to national recommendations of social distancing due to COVID 19, an audio/video telehealth visit is felt to be most appropriate for this patient at this time.  See MyChart message from today for the patient's consent to telehealth for [facility].  Date:  08/07/2019   ID:  July Czepiel, DOB Jul 12, 1985, MRN [id number]  Location: patient's home  Provider location:  [place]  Evaluation Performed: Follow-up visit  PCP:  Network, [facility]   Electrophysiologist:  Dr Rayann Heman  Chief Complaint:  palpitations  History of Present Illness:    Tramaine Copenhaver is a 34 y.o. female who presents via telehealth conferencing today.  Since ICD lead revision, the patient reports doing very well.  Her primary concern is with ongoing SOB>  Today, she denies symptoms of palpitations, chest pain,  lower extremity edema, dizziness, presyncope, or syncope.  The patient is otherwise without complaint today.   Past Medical History:  Diagnosis Date  . Anemia    CHRONIC  . Chronic systolic dysfunction of left ventricle   . GERD (gastroesophageal reflux disease)    WITH PREGNANCY  . Nonischemic cardiomyopathy ([place])   . Pneumonia    x 1  . PVC's (premature ventricular contractions)     Past Surgical History:  Procedure Laterality Date  . DILATION AND EVACUATION N/A 11/30/2016   Procedure: DILATATION AND EVACUATION;  Surgeon: Donnamae Jude, MD;  Location: [facility];  Service: Gynecology;  Laterality: N/A;  . ICD IMPLANT N/A 02/19/2019   Procedure: ICD IMPLANT;  Surgeon: Thompson Grayer, MD;  Location: [place] [facility];  Service: Cardiovascular;  Laterality: N/A;  . LEAD REVISION/REPAIR N/A 04/16/2019   Procedure: LEAD REVISION/REPAIR;  Surgeon: Thompson Grayer, MD;  Location: [place] [facility];  Service: Cardiovascular;  Laterality: N/A;  . RIGHT/LEFT HEART CATH AND CORONARY ANGIOGRAPHY N/A 01/22/2019   Procedure: RIGHT/LEFT HEART CATH AND  CORONARY ANGIOGRAPHY;  Surgeon: Jolaine Artist, MD;  Location: [place] [facility];  Service: Cardiovascular;  Laterality: N/A;  . WISDOM TOOTH EXTRACTION     NO ANESTHESIA    Current Outpatient Medications  Medication Sig Dispense Refill  . acetaminophen (TYLENOL) 325 MG tablet Take 650 mg by mouth every 6 (six) hours as needed for mild pain or headache.    Marland Kitchen amiodarone (PACERONE) 200 MG tablet Take 1 tablet (200 mg total) by mouth daily. 90 tablet 0  . carvedilol (COREG) 6.25 MG tablet Take 1 tablet (6.25 mg total) by mouth 2 (two) times daily. 180 tablet 3  . digoxin (LANOXIN) 0.125 MG tablet Take 1 tablet (0.125 mg total) by mouth daily. 90 tablet 0  . furosemide (LASIX) 40 MG tablet Take 1 tablet (40 mg total) by mouth 2 (two) times daily. 60 tablet 6  . isosorbide-hydrALAZINE (BIDIL) 20-37.5 MG tablet Take 1 tablet by mouth daily.    Marland Kitchen levonorgestrel (MIRENA) 20 MCG/24HR IUD 1 each by Intrauterine route once.    . meclizine (ANTIVERT) 25 MG tablet Take 1 tablet (25 mg total) by mouth 3 (three) times daily as needed for dizziness. 15 tablet 0  . potassium chloride SA (K-DUR) 20 MEQ tablet Take 1 tablet (20 mEq total) by mouth daily. 90 tablet 0  . sacubitril-valsartan (ENTRESTO) 97-103 MG Take 1 tablet by mouth 2 (two) times daily. 60 tablet 6  . spironolactone (ALDACTONE) 25 MG tablet Take 1 tablet (25 mg total) by mouth daily. 30 tablet 6   No current facility-administered medications  for this visit.    Allergies:   Patient has no known allergies.   Social History:  The patient  reports that she has never smoked. She has never used smokeless tobacco. She reports that she does not drink alcohol or use drugs.   ROS:  Please see the history of present illness.   All other systems are personally reviewed and negative.    Exam:    Vital Signs:  Ht 5\' 2"  (1.575 m)   Wt 260 lb (117.9 kg)   BMI 47.55 kg/m   Well sounding and appearing, alert and conversant, regular work of  breathing,  good skin color Eyes- anicteric, neuro- grossly intact, skin- no apparent rash or lesions or cyanosis, mouth- oral mucosa is pink  Labs/Other Tests and Data Reviewed:    Recent Labs: 03/16/2019: B Natriuretic Peptide 207.8 03/25/2019: Magnesium 1.8 04/16/2019: Hemoglobin 12.5; Platelets 275 07/20/2019: ALT 17; BUN 8; Creatinine, Ser 0.63; Potassium 3.8; Sodium 137; TSH 0.582   Wt Readings from Last 3 Encounters:  08/07/19 260 lb (117.9 kg)  07/20/19 265 lb (120.2 kg)  04/16/19 252 lb (114.3 kg)     Last device remote is reviewed from [place] PDF which reveals normal device function, no arrhythmias    ASSESSMENT & PLAN:    1.  Chronic systolic dysfunction/ nonischemic CM Doing well s/p ICD Remotes are uptodate Followed in ICM device clinic  Could consider barostim activation therapy.  Will defer to Dr Haroldine Laws.  2. PVCs Histograms support that PVCs are reasonably well controlled Reduce amiodarone to 100mg  daily as this could be part of her SOB. She will need close following/ monitoring on amiodarone t avoid toxicity. Her PVCs have been too infrequent previously for 3d mapping / ablation  3. SVT Sinus tach vs SVT Continue coreg    Follow-up:  Return to see EP PA in 6 months   Patient Risk:  after full review of this patients clinical status, I feel that they are at moderate risk at this time.  Today, I have spent 15 minutes with the patient with telehealth technology discussing arrhythmia management .    Army Fossa, MD  08/07/2019 9:20 AM     [facility] [address]  [place] [address] [phone] (office) [phone] (fax)

## 2019-08-07 NOTE — Telephone Encounter (Signed)
-----   Message from Thompson Grayer, MD sent at 08/07/2019  9:29 AM EDT ----- Reduce amiodarone to 100mg  daily Follow-up with Jonni Sanger in 6 months

## 2019-08-07 NOTE — Telephone Encounter (Signed)
Updated Pt med list.  Recall will be entered. Sent mychart message with Pt instruction.

## 2019-08-10 ENCOUNTER — Ambulatory Visit (INDEPENDENT_AMBULATORY_CARE_PROVIDER_SITE_OTHER): Payer: Medicaid Other

## 2019-08-10 ENCOUNTER — Other Ambulatory Visit: Payer: Self-pay | Admitting: *Deleted

## 2019-08-10 DIAGNOSIS — I5022 Chronic systolic (congestive) heart failure: Secondary | ICD-10-CM

## 2019-08-10 DIAGNOSIS — Z95810 Presence of automatic (implantable) cardiac defibrillator: Secondary | ICD-10-CM | POA: Diagnosis not present

## 2019-08-10 NOTE — Progress Notes (Signed)
error 

## 2019-08-11 NOTE — Progress Notes (Signed)
EPIC Encounter for ICM Monitoring  Patient Name: Tara Bradshaw is a 34 y.o. female Date: 08/11/2019 Primary Care Physican: Network, [place] Primary Cardiologist:Bensimhon Electrophysiologist:Allred 08/11/2019 Weight:264lbs   Heart Failure questions reviewed. She is recovering from [place].  Corvue thoracic impedancenormal  Prescribed:   Furosemide40 mg take 1 tablet by mouth twice a day.   Potassium 20 mEq take 1 tablet daily.  Recommendations:No changes and encouraged to call if experiencing any fluid symptoms.  Follow-up plan: ICM clinic phone appointment on6/10/2019. 91 day device clinic remote transmission 08/28/2019.Office visit with HF clinic on 08/26/2019 with Dr Haroldine Laws.   Copy of ICM check sent to Dr.Allred.   3 month ICM trend: 08/10/2019    1 Year ICM trend:       [name], RN 08/11/2019 11:07 AM

## 2019-08-26 ENCOUNTER — Encounter (HOSPITAL_COMMUNITY): Payer: Medicaid Other | Admitting: Internal Medicine

## 2019-08-26 ENCOUNTER — Encounter (HOSPITAL_COMMUNITY): Payer: Self-pay

## 2019-08-27 ENCOUNTER — Ambulatory Visit: Payer: Medicaid Other

## 2019-08-27 ENCOUNTER — Encounter: Payer: Medicaid Other | Admitting: Internal Medicine

## 2019-08-28 ENCOUNTER — Ambulatory Visit (INDEPENDENT_AMBULATORY_CARE_PROVIDER_SITE_OTHER): Payer: Medicaid Other | Admitting: *Deleted

## 2019-08-28 DIAGNOSIS — I429 Cardiomyopathy, unspecified: Secondary | ICD-10-CM | POA: Diagnosis not present

## 2019-08-28 DIAGNOSIS — I5021 Acute systolic (congestive) heart failure: Secondary | ICD-10-CM

## 2019-08-28 LAB — CUP PACEART REMOTE DEVICE CHECK
Battery Remaining Longevity: 92 mo
Battery Remaining Percentage: 92 %
Battery Voltage: 2.99 V
Brady Statistic RV Percent Paced: 0 %
Date Time Interrogation Session: 20210521020013
HighPow Impedance: 66 Ohm
Implantable Lead Implant Date: 20210107
Implantable Lead Location: 753860
Implantable Pulse Generator Implant Date: 20201112
Lead Channel Impedance Value: 340 Ohm
Lead Channel Pacing Threshold Amplitude: 0.75 V
Lead Channel Pacing Threshold Pulse Width: 0.5 ms
Lead Channel Sensing Intrinsic Amplitude: 11.7 mV
Lead Channel Setting Pacing Amplitude: 3.5 V
Lead Channel Setting Pacing Pulse Width: 0.5 ms
Lead Channel Setting Sensing Sensitivity: 0.5 mV
Pulse Gen Serial Number: 111012702

## 2019-08-31 NOTE — Progress Notes (Signed)
Remote ICD transmission.   

## 2019-09-14 ENCOUNTER — Ambulatory Visit (INDEPENDENT_AMBULATORY_CARE_PROVIDER_SITE_OTHER): Payer: Medicaid Other

## 2019-09-14 ENCOUNTER — Telehealth: Payer: Self-pay | Admitting: Internal Medicine

## 2019-09-14 ENCOUNTER — Telehealth: Payer: Self-pay

## 2019-09-14 ENCOUNTER — Encounter (HOSPITAL_COMMUNITY): Payer: Self-pay

## 2019-09-14 DIAGNOSIS — Z95810 Presence of automatic (implantable) cardiac defibrillator: Secondary | ICD-10-CM | POA: Diagnosis not present

## 2019-09-14 DIAGNOSIS — I5022 Chronic systolic (congestive) heart failure: Secondary | ICD-10-CM | POA: Diagnosis not present

## 2019-09-14 NOTE — Telephone Encounter (Signed)
  Transmission reviewed that was sent 09/14/19 at 1101, Device function WNL. No arrhythmia episodes since 07/30/19. Confirmed that patient is going to ED for evaluatuion due to SOB and decreased o2sat.

## 2019-09-14 NOTE — Telephone Encounter (Signed)
Pt c/o Shortness Of Breath: STAT if SOB developed within the last 24 hours or pt is noticeably SOB on the phone  1. Are you currently SOB (can you hear that pt is SOB on the phone)? No  2. How long have you been experiencing SOB? Since 09/12/19  3. Are you SOB when sitting or when up moving around? Both  4. Are you currently experiencing any other symptoms? Chest pain   Pt c/o of Chest Pain: STAT if CP now or developed within 24 hours  1. Are you having CP right now? No  2. Are you experiencing any other symptoms (ex. SOB, nausea, vomiting, sweating)? SOB  3. How long have you been experiencing CP? Since 09/12/19  4. Is your CP continuous or coming and going? Coming and going  5. Have you taken Nitroglycerin? No ?

## 2019-09-14 NOTE — Telephone Encounter (Signed)
See response to MyChart message.  Pt claims oxygen levels running in the 80's.    Advised to go to urgent care or ER for assessment.

## 2019-09-14 NOTE — Telephone Encounter (Signed)
Received patient call.  She reports she had SOB last night and this morning.  She is using pulse oximter and the oxygne levels have dropped to as low as 78% but fluctuates to the 90's.  Advised received a device alert within last 25 minutes and it suggests a change in her heart rhythm.  Advised to call 911 or use ER due to low oxygen levels and I will notify device triage nurse to review todays report.  She verbalized understanding.  Call to device clinic to ask for review of report and forwarded call to device triage.

## 2019-09-16 NOTE — Progress Notes (Signed)
EPIC Encounter for ICM Monitoring  Patient Name: Tara Bradshaw is a 34 y.o. female Date: 09/16/2019 Primary Care Physican: Network, [place] Primary Cardiologist:Bensimhon Electrophysiologist:Allred 08/11/2019 Weight:264lbs   Spoke with patient. She said her O2 sats are now in high 90's.  She has cold and cough.   Patient advised on 09/14/2019 to go to ER for SOB and low oxygen saturation readings at home.  Pt has history of COVID19  Corvue thoracic impedancenormal.  Prescribed:   Furosemide40 mg take 1 tablet by mouth twice a day.   Potassium 20 mEq take 1 tablet daily.  Recommendations:Advised if low oxygen levels of <88-90 occur again she should call 911 or use ER.  She verbalized understanding.  Follow-up plan: ICM clinic phone appointment on7/03/2020. 91 day device clinic remote transmission 11/27/2019.Office visit withHF clinic on 11/18/2019 with Dr Haroldine Laws.   Copy of ICM check sent to Dr.Allred.  3 month ICM trend: 09/14/2019    1 Year ICM trend:       [name], RN 09/16/2019 8:04 AM

## 2019-10-05 ENCOUNTER — Other Ambulatory Visit (HOSPITAL_COMMUNITY): Payer: Medicaid Other

## 2019-10-08 ENCOUNTER — Telehealth (HOSPITAL_COMMUNITY): Payer: Self-pay | Admitting: *Deleted

## 2019-10-08 ENCOUNTER — Encounter (HOSPITAL_COMMUNITY): Payer: Medicaid Other

## 2019-10-08 NOTE — Telephone Encounter (Signed)
Contacted patient for NS for CPX this morning. Voicemail box full and unable to receive messages.     Landis Martins, MS, ACSM-RCEP Clinical Exercise Physiologist

## 2019-10-17 ENCOUNTER — Encounter (HOSPITAL_COMMUNITY): Payer: Self-pay

## 2019-10-19 ENCOUNTER — Ambulatory Visit (INDEPENDENT_AMBULATORY_CARE_PROVIDER_SITE_OTHER): Payer: Medicaid Other

## 2019-10-19 DIAGNOSIS — I5022 Chronic systolic (congestive) heart failure: Secondary | ICD-10-CM | POA: Diagnosis not present

## 2019-10-19 DIAGNOSIS — Z95810 Presence of automatic (implantable) cardiac defibrillator: Secondary | ICD-10-CM

## 2019-10-20 ENCOUNTER — Telehealth: Payer: Self-pay

## 2019-10-20 ENCOUNTER — Telehealth (HOSPITAL_COMMUNITY): Payer: Self-pay | Admitting: Licensed Clinical Social Worker

## 2019-10-20 DIAGNOSIS — I493 Ventricular premature depolarization: Secondary | ICD-10-CM

## 2019-10-20 DIAGNOSIS — I429 Cardiomyopathy, unspecified: Secondary | ICD-10-CM

## 2019-10-20 NOTE — Telephone Encounter (Signed)
CSW received consult to help pt get set up with outpatient therapy to help her with some concerns in her personal life right now.  CSW called pt to discuss.  She is agreeable to getting outpatient therapy and is hopeful this will allow her an outlet for her stress and anxiety as she is concerned this is negatively affecting her health and she has no one she feels like she can talk to at this time.  CSW able to set up appt with [facility] set for 7/15 at 11am- pt provided with appt information and reports no barriers to attending.  Encouraged pt to reach out if she needed to talk to someone before then or had further questions- CSW will continue to follow and assist as needed  Jorge Ny, [facility] Desk#: [phone] Cell#: [phone]

## 2019-10-20 NOTE — Telephone Encounter (Signed)
-----   Message from [name], RN sent at 10/20/2019  1:03 PM EDT ----- Regarding: 7/12 Merlin Report & SVT epsiodes & NSVT Pt reports not feeling well the last month and having funny feelings in her chest but does not desribe as chest pain.  Report says 35 VT/VF episodes.  SVT 13 and NSVT 22.  Could you please review and follow up if needed.  Thanks!  Margarita Grizzle

## 2019-10-20 NOTE — Progress Notes (Signed)
EPIC Encounter for ICM Monitoring  Patient Name: Tara Bradshaw is a 34 y.o. female Date: 10/20/2019 Primary Care Physican: [facility] Primary Cardiologist:Bensimhon Electrophysiologist:Allred 08/11/2019 Weight:264lbs     Spoke with patient.  She stated she still feels SOB at times and has that funny feeling in her chest.  She said it is not chest pain.  She is unsure if these ongoing symptoms for the last few months is a long term effect of COVID that she had earlier this year.  Corvue thoracic impedancenormal but was suggesting possible fluid accumulation from 6/11-6/25.  Message sent to device clinic triage to review report for VT/VF episodes.  Prescribed:   Furosemide40 mg take 1 tablet by mouth twice a day.   Potassium 20 mEq take 1 tablet daily.  Recommendations: No changes and encouraged to call if experiencing any fluid symptoms.  Follow-up plan: ICM clinic phone appointment on8/16/2021. 91 day device clinic remote transmission 11/27/2019.  EP/Cardiology Office Visits: 11/18/2019 with Dr. Haroldine Laws.    Copy of ICM check sent to Dr. Rayann Heman and Dr Haroldine Laws for [facility] of episodes.   3 month ICM trend: 10/19/2019    1 Year ICM trend:       [name], RN 10/20/2019 12:56 PM

## 2019-10-20 NOTE — Telephone Encounter (Signed)
Reviewed transmission and episodes with pt.  She indicates that her symptoms pretty much started around 10/07/19.  Since then she has had intermittent episodes of chest tightness, she denies any pain.  In reviewing other possible symptoms, the pt did indicate that at times she may also feel lightheaded, but denies any other symptoms.    Current medications include Amiodarone 100mg  daily, Carvedilol 6.25mg  BID, Digoxin 0.125 mg daily, Bidil 20-37.5 mg daily,  Potassium 15meq daily, Entresto 97.103 mg BID.  Pt confirms compliance with meds as ordered.    As noted transmission shows 35 NSVT and SVT episodes, the majority of which occurred between 1530 and 1830 on 10/07/19.  EGMs are not available for any of these episodes.    At time of call, pt denies any symptoms, states she last had a chest tightness episode earlier today.    Educated patient on ED precautions, advised she needs to be seen in office for assessment.  Will send to scheduler to contact pt for appt with MD or APP.

## 2019-10-21 NOTE — Telephone Encounter (Signed)
Patient will come in for BMP and Mag 10/22/2019.    Follow up scheduled

## 2019-10-21 NOTE — Telephone Encounter (Signed)
I am out this week as well. Please check BMET and Mag.  Please review with EP covering for Dr. Rayann Heman.   Thank you

## 2019-10-21 NOTE — Telephone Encounter (Signed)
I se that she is scheduled with Dr. Rayann Heman on 7/19.  Will you handle the labs that Dr. Haroldine Laws requested?

## 2019-10-22 ENCOUNTER — Other Ambulatory Visit: Payer: Medicaid Other | Admitting: *Deleted

## 2019-10-22 ENCOUNTER — Other Ambulatory Visit: Payer: Self-pay

## 2019-10-22 ENCOUNTER — Ambulatory Visit (INDEPENDENT_AMBULATORY_CARE_PROVIDER_SITE_OTHER): Payer: Medicaid Other | Admitting: Clinical

## 2019-10-22 DIAGNOSIS — I429 Cardiomyopathy, unspecified: Secondary | ICD-10-CM

## 2019-10-22 DIAGNOSIS — F4323 Adjustment disorder with mixed anxiety and depressed mood: Secondary | ICD-10-CM

## 2019-10-22 LAB — BASIC METABOLIC PANEL
BUN/Creatinine Ratio: 11 (ref 9–23)
BUN: 7 mg/dL (ref 6–20)
CO2: 23 mmol/L (ref 20–29)
Calcium: 9.6 mg/dL (ref 8.7–10.2)
Chloride: 103 mmol/L (ref 96–106)
Creatinine, Ser: 0.65 mg/dL (ref 0.57–1.00)
GFR calc Af Amer: 133 mL/min/{1.73_m2} (ref >59)
GFR calc non Af Amer: 115 mL/min/{1.73_m2} (ref >59)
Glucose: 97 mg/dL (ref 65–99)
Potassium: 3.4 mmol/L — ABNORMAL LOW (ref 3.5–5.2)
Sodium: 140 mmol/L (ref 134–144)

## 2019-10-22 LAB — MAGNESIUM: Magnesium: 1.8 mg/dL (ref 1.6–2.3)

## 2019-10-23 ENCOUNTER — Telehealth (HOSPITAL_COMMUNITY): Payer: Self-pay | Admitting: Licensed Clinical Social Worker

## 2019-10-23 NOTE — Telephone Encounter (Signed)
CSW called to check in with pt regarding first therapy appt yesterday.  Pt stated that it went well but they were unable to schedule her for another appt until several weeks from now and pt was hopeful for weekly visits.  CSW sent pt list of counselors in [place] who accept Medicaid for her to call and inquire about appts.  Will continue to follow and assist as needed  Jorge Ny, [facility] Desk#: [phone] Cell#: [phone]

## 2019-10-23 NOTE — Telephone Encounter (Signed)
Adding EGMS for available episodes  Longest    Most recent NSVT

## 2019-10-24 DIAGNOSIS — F4323 Adjustment disorder with mixed anxiety and depressed mood: Secondary | ICD-10-CM | POA: Insufficient documentation
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
## 2019-10-26 ENCOUNTER — Encounter: Payer: Medicaid Other | Admitting: Internal Medicine

## 2019-10-26 ENCOUNTER — Telehealth: Payer: Self-pay | Admitting: *Deleted

## 2019-10-26 DIAGNOSIS — E876 Hypokalemia: Secondary | ICD-10-CM

## 2019-10-26 NOTE — Telephone Encounter (Signed)
Spoke with the pt and informed her that when reviewing her labs today, noted her K level was 3.4 and she is already on KDUR 20 mEq po daily for this.  Pt confirmed she is taking this daily with no skipped doses. Advised the pt that per our lab protocol and standing orders for hypokalemia and being she is on KDUR already, advised her to take an extra dose of KDUR tonight (40 mEq total) and tomorrow, then go back to 20 mEq po daily thereafter, and come in for repeat BMET on 7/26.  Pt states she has enough KDUR on hand at this time. Scheduled the pt for repeat BMET for next Monday 7/26. Pt verbalized understanding and agrees with this plan.
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
## 2019-10-29 ENCOUNTER — Ambulatory Visit (INDEPENDENT_AMBULATORY_CARE_PROVIDER_SITE_OTHER): Payer: Medicaid Other | Admitting: Physician Assistant

## 2019-10-29 ENCOUNTER — Other Ambulatory Visit: Payer: Self-pay

## 2019-10-29 VITALS — BP 118/68 | HR 84 | Ht 61.0 in | Wt 250.0 lb

## 2019-10-29 DIAGNOSIS — I493 Ventricular premature depolarization: Secondary | ICD-10-CM | POA: Diagnosis not present

## 2019-10-29 DIAGNOSIS — I428 Other cardiomyopathies: Secondary | ICD-10-CM

## 2019-10-29 DIAGNOSIS — I471 Supraventricular tachycardia: Secondary | ICD-10-CM

## 2019-10-29 DIAGNOSIS — I5022 Chronic systolic (congestive) heart failure: Secondary | ICD-10-CM

## 2019-10-29 DIAGNOSIS — R0789 Other chest pain: Secondary | ICD-10-CM

## 2019-10-29 DIAGNOSIS — I429 Cardiomyopathy, unspecified: Secondary | ICD-10-CM

## 2019-10-29 NOTE — Patient Instructions (Signed)
Medication Instructions:   *If you need a refill on your cardiac medications before your next appointment, please call your pharmacy*   Lab Work:  If you have labs (blood work) drawn today and your tests are completely normal, you will receive your results only by: Marland Kitchen MyChart Message (if you have MyChart) OR . A paper copy in the mail If you have any lab test that is abnormal or we need to change your treatment, we will call you to review the results.   Testing/Procedures:   Follow-Up: At [facility], you and your health needs are our priority.  As part of our continuing mission to provide you with exceptional heart care, we have created designated Provider Care Teams.  These Care Teams include your primary Cardiologist (physician) and Advanced Practice Providers (APPs -  Physician Assistants and Nurse Practitioners) who all work together to provide you with the care you need, when you need it.  We recommend signing up for the patient portal called "MyChart".  Sign up information is provided on this After Visit Summary.  MyChart is used to connect with patients for Virtual Visits (Telemedicine).  Patients are able to view lab/test results, encounter notes, upcoming appointments, etc.  Non-urgent messages can be sent to your provider as well.   To learn more about what you can do with MyChart, go to [email or web address].    Your next appointment:   6 month(s)  The format for your next appointment:   In Person  Provider:   You may see Dr. Rayann Heman  or one of the following Advanced Practice Providers on your designated Care Team:    Chanetta Marshall, NP  Tommye Standard, PA-C  Legrand Como "Jonni Sanger" [place], [place]   Other Instructions:

## 2019-11-02 ENCOUNTER — Other Ambulatory Visit: Payer: Medicaid Other

## 2019-11-03 DIAGNOSIS — F439 Reaction to severe stress, unspecified: Secondary | ICD-10-CM | POA: Diagnosis not present

## 2019-11-06 NOTE — Telephone Encounter (Signed)
Patient seen on 7/22 by Renee, device was interrogated, normal function noted.

## 2019-11-11 ENCOUNTER — Other Ambulatory Visit: Payer: Medicaid Other

## 2019-11-11 DIAGNOSIS — F439 Reaction to severe stress, unspecified: Secondary | ICD-10-CM | POA: Diagnosis not present

## 2019-11-13 ENCOUNTER — Ambulatory Visit (HOSPITAL_COMMUNITY): Payer: Medicaid Other | Admitting: Clinical

## 2019-11-17 NOTE — Progress Notes (Signed)
Advanced Heart Failure Clinic Note   -- Patient cancelled visit. Note left for templating purposes --   Date:  11/17/2019   ID:  Tara Bradshaw, DOB June 18, 1985, MRN [id number]  Location: Home  Provider location: [facility] Type of Visit: Established patient  PCP:  Patient, No Pcp Per  Cardiologist:  No primary care provider on file. Primary HF: Dr. Haroldine Laws  Chief Complaint: f/u for Chronic Systolic Heart Failure    History of Present Illness:  [name] is a 34 y/o woman with h/o mild HTN who was admitted in 3/20 with acute systolic HF with EF 10-27% in setting of recent viral illness and 79-month post-partum status. COVID testing negative. Did not undergo cath as felt to be NICM   ZioPatch 8/20 NSR. Occasional PVCs (3.5%) 9-beat run NSVT   Had return visit w/ Dr. Haroldine Laws 01/15/19. Endorsed fatigue after starting carvedilol. Struggled w/ ADLs. No edema or PND. Noted mild orthopnea and symptoms c/w vertigo. Echo was repeated at that visit and EF was 25-30% (read as 30-35%). She was set up for [facility] on 01/22/19 which showed severe NICM EF 20%, Normal coronaries, Well compensated hemodynamics with high cardiac output.   Seen in Clinic 4/21 and CPX test ordered due to persistent NYHA III symptoms but not completed.   She presents back to clinic today for f/u. Continues to endorse exertional dyspnea/ fatigue. NYHA Class II-III. No resting dyspnea. Corvue impedence monitored by EP clinic and has been stable. Unable to get device interrogation today due to office equipment error. However she is euvolemic on exam and by ReDs Vest, 30%. Reports full med compliance but has reduced Bidil down from tid to once daily. She takes this at night. Reports that she did not tolerate tid dosing to due frequent HAs and dizziness w/ low BP. BP is stable and well controlled today, 253 systolic. Admits to poor compliance w/ CPAP but will try to improve usage.    Echo  10/20: EF 25-30% (read as 30-35%)    Past Medical History:  Diagnosis Date   Anemia    CHRONIC   Chronic systolic dysfunction of left ventricle    GERD (gastroesophageal reflux disease)    WITH PREGNANCY   Nonischemic cardiomyopathy ([facility])    Pneumonia    x 1   PVC's (premature ventricular contractions)    Past Surgical History:  Procedure Laterality Date   DILATION AND EVACUATION N/A 11/30/2016   Procedure: DILATATION AND EVACUATION;  Surgeon: Donnamae Jude, MD;  Location: [facility];  Service: Gynecology;  Laterality: N/A;   ICD IMPLANT N/A 02/19/2019   Procedure: ICD IMPLANT;  Surgeon: Thompson Grayer, MD;  Location: [facility];  Service: Cardiovascular;  Laterality: N/A;   LEAD REVISION/REPAIR N/A 04/16/2019   Procedure: LEAD REVISION/REPAIR;  Surgeon: Thompson Grayer, MD;  Location: [facility];  Service: Cardiovascular;  Laterality: N/A;   RIGHT/LEFT HEART CATH AND CORONARY ANGIOGRAPHY N/A 01/22/2019   Procedure: RIGHT/LEFT HEART CATH AND CORONARY ANGIOGRAPHY;  Surgeon: Jolaine Artist, MD;  Location: [facility];  Service: Cardiovascular;  Laterality: N/A;   WISDOM TOOTH EXTRACTION     NO ANESTHESIA     Current Outpatient Medications  Medication Sig Dispense Refill   acetaminophen (TYLENOL) 325 MG tablet Take 650 mg by mouth every 6 (six) hours as needed for mild pain or headache.     amiodarone (PACERONE) 200 MG tablet Take 0.5 tablets (100 mg total) by  mouth daily. 45 tablet 3   carvedilol (COREG) 6.25 MG tablet Take 1 tablet (6.25 mg total) by mouth 2 (two) times daily. 180 tablet 3   digoxin (LANOXIN) 0.125 MG tablet Take 1 tablet (0.125 mg total) by mouth daily. 90 tablet 0   furosemide (LASIX) 40 MG tablet Take 1 tablet (40 mg total) by mouth 2 (two) times daily. 60 tablet 6   isosorbide-hydrALAZINE (BIDIL) 20-37.5 MG tablet Take 1 tablet by mouth daily.     levonorgestrel (MIRENA) 20 MCG/24HR IUD 1 each by Intrauterine route once.     meclizine  (ANTIVERT) 25 MG tablet Take 1 tablet (25 mg total) by mouth 3 (three) times daily as needed for dizziness. 15 tablet 0   potassium chloride SA (K-DUR) 20 MEQ tablet Take 1 tablet (20 mEq total) by mouth daily. 90 tablet 0   sacubitril-valsartan (ENTRESTO) 97-103 MG Take 1 tablet by mouth 2 (two) times daily. 60 tablet 6   spironolactone (ALDACTONE) 25 MG tablet Take 1 tablet (25 mg total) by mouth daily. 30 tablet 6   No current facility-administered medications for this encounter.    Allergies:   Patient has no known allergies.   Social History:  The patient  reports that she has never smoked. She has never used smokeless tobacco. She reports that she does not drink alcohol and does not use drugs.   Family History:  The patient's family history includes Asthma in her mother; Cancer in her paternal grandmother; Cancer (age of onset: 16) in her maternal aunt; Epilepsy in her mother; Hypertension in her maternal grandmother.   ROS:  Please see the history of present illness.   All other systems are personally reviewed and negative.   There were no vitals filed for this visit.  PHYSICAL EXAM: ReDS Clip 20% General:  Well appearing obese young AAF. No respiratory difficulty HEENT: normal Neck: supple. no JVD. Carotids 2+ bilat; no bruits. No lymphadenopathy or thyromegaly appreciated. Cor: PMI nondisplaced. Regular rate & rhythm. No rubs, gallops or murmurs. Lungs: clear Abdomen: obese but soft, nontender, nondistended. No hepatosplenomegaly. No bruits or masses. Good bowel sounds. Extremities: no cyanosis, clubbing, rash, edema Neuro: alert & oriented x 3, cranial nerves grossly intact. moves all 4 extremities w/o difficulty. Affect pleasant.    Recent Labs: 03/16/2019: B Natriuretic Peptide 207.8 04/16/2019: Hemoglobin 12.5; Platelets 275 07/20/2019: ALT 17; TSH 0.582 10/22/2019: BUN 7; Creatinine, Ser 0.65; Magnesium 1.8; Potassium 3.4; Sodium 140  Personally reviewed   Wt Readings  from Last 3 Encounters:  10/29/19 (!) 113.4 kg (250 lb)  08/07/19 117.9 kg (260 lb)  07/20/19 120.2 kg (265 lb)      ASSESSMENT AND PLAN:  1. Acute systolic HF - Diagnosed 2/36. Echo LVEF 25% with moderate RV dysfunction - NICM. Either viral (had URI in week prior to admission and respiratory panel + rhinovirus) or post-partum or PVC cardiomyopathy. [place] 10/20 showed normal cors. RHC showed well compensated hemodynamics with high cardiac output. No evidence of intracardiac shunting.  - Echo 10/20 EF 25-30%.  - Now has ICD followed by Dr. Rayann Heman.   - Volume status good today. ReDs Clip 30%, but remains symptomatic w/ NYHA Class II-III symptoms.  - Will arrange CPX for further assessment  - Continue lasix 40 bid - Continue entresto to 97/103. On contraception (Mirena) - Continue digoxin 0.125. check dig level today  - Continue spiro 12.5 - Continue Bidil 1 tab daily (only able to tolerate once daily dosing. Takes at night. Intolerant  to tid dosing due to HAs and dizziness/ low BP) - Continue carvedilol 3.125 bid  - Not candidate for cMRI due to size - Consider future addition of a SGLT2i (will hold off for now given euvolemic status, soft BP and difficulties tolerating tid bidil due to "dizziness" w/ low BP concerning for orthostatic hypotension).   2. OSA - Had WatchPat study 07/30/18 has moderate OSA (AHI 15.3). - admits to poor compliance w/ CPAP - we discussed importance of strict compliance w/ CPAP given HF and PVC history.  - she will work to improve compliance  3. PVCs/Palpitations - In hospital was averaging about 8-10 PVCs per minute. May be contributing to LV dysfunction - ZioPatch. NSR one 9-beat run NSVT. PVC 3.5% - Continue low dose amio 200 daily for suppression - Check TFT + HFTs today - Advised to get annual eye exams    Signed, Glori Bickers, MD  11/17/2019 10:38 PM  [facility] [address] [facility] [place] [address] [phone] (office) [phone] (fax)

## 2019-11-18 ENCOUNTER — Inpatient Hospital Stay (HOSPITAL_COMMUNITY)
Admission: RE | Admit: 2019-11-18 | Discharge: 2019-11-18 | Disposition: A | Discharge status: Home / Self Care | Payer: Medicaid Other | Source: Ambulatory Visit | Attending: Internal Medicine | Admitting: Internal Medicine

## 2019-11-19 ENCOUNTER — Encounter: Payer: Medicaid Other | Admitting: Physician Assistant

## 2019-11-23 ENCOUNTER — Ambulatory Visit (INDEPENDENT_AMBULATORY_CARE_PROVIDER_SITE_OTHER): Payer: Medicaid Other

## 2019-11-23 DIAGNOSIS — Z95810 Presence of automatic (implantable) cardiac defibrillator: Secondary | ICD-10-CM | POA: Diagnosis not present

## 2019-11-23 DIAGNOSIS — I5022 Chronic systolic (congestive) heart failure: Secondary | ICD-10-CM | POA: Diagnosis not present

## 2019-11-24 NOTE — Progress Notes (Signed)
EPIC Encounter for ICM Monitoring  Patient Name: Brittne Kawasaki is a 34 y.o. female Date: 11/24/2019 Primary Care Physican: Patient, No Pcp Per Primary Cardiologist:Bensimhon Electrophysiologist:Allred 11/24/2019 Weight:258lbs    Spoke with patient and reports feeling well at this time.  Denies fluid symptoms.    Corvue thoracic impedancenormal.  Prescribed:   Furosemide40 mg take 1 tablet by mouth twice a day.   Potassium 20 mEq take 1 tablet daily.  Recommendations: No changes and encouraged to call if experiencing any fluid symptoms.  Follow-up plan: ICM clinic phone appointment on9/20/2021. 91 day device clinic remote transmission8/20/2021.  EP/Cardiology Office Visits: 01/18/2020 with Dr. Haroldine Laws.    Copy of ICM check sent to Dr. Rayann Heman.  3 month ICM trend: 11/23/2019    1 Year ICM trend:       [name], RN 11/24/2019 4:06 PM

## 2019-11-26 ENCOUNTER — Encounter: Payer: Medicaid Other | Admitting: Internal Medicine

## 2019-11-26 ENCOUNTER — Ambulatory Visit: Payer: Medicaid Other

## 2019-11-27 ENCOUNTER — Ambulatory Visit (INDEPENDENT_AMBULATORY_CARE_PROVIDER_SITE_OTHER): Payer: Medicaid Other | Admitting: *Deleted

## 2019-11-27 DIAGNOSIS — I429 Cardiomyopathy, unspecified: Secondary | ICD-10-CM | POA: Diagnosis not present

## 2019-11-27 LAB — CUP PACEART REMOTE DEVICE CHECK
Battery Remaining Longevity: 89 mo
Battery Remaining Percentage: 89 %
Battery Voltage: 2.99 V
Brady Statistic RV Percent Paced: 0 %
Date Time Interrogation Session: 20210820020013
HighPow Impedance: 68 Ohm
Implantable Lead Implant Date: 20210107
Implantable Lead Location: 753860
Implantable Pulse Generator Implant Date: 20201112
Lead Channel Impedance Value: 350 Ohm
Lead Channel Pacing Threshold Amplitude: 1 V
Lead Channel Pacing Threshold Pulse Width: 0.5 ms
Lead Channel Sensing Intrinsic Amplitude: 11.9 mV
Lead Channel Setting Pacing Amplitude: 3.5 V
Lead Channel Setting Pacing Pulse Width: 0.5 ms
Lead Channel Setting Sensing Sensitivity: 0.5 mV
Pulse Gen Serial Number: 111012702

## 2019-11-30 NOTE — Progress Notes (Signed)
Remote ICD transmission.   

## 2019-12-01 DIAGNOSIS — F439 Reaction to severe stress, unspecified: Secondary | ICD-10-CM | POA: Diagnosis not present

## 2019-12-08 DIAGNOSIS — F439 Reaction to severe stress, unspecified: Secondary | ICD-10-CM | POA: Diagnosis not present

## 2019-12-22 ENCOUNTER — Other Ambulatory Visit (HOSPITAL_COMMUNITY): Payer: Self-pay | Admitting: *Deleted

## 2019-12-22 MED ORDER — POTASSIUM CHLORIDE CRYS ER 20 MEQ PO TBCR: 20.0000 meq | EXTENDED_RELEASE_TABLET | Freq: Every day | ORAL | 3 refills | Status: DC

## 2019-12-22 MED ORDER — FUROSEMIDE 40 MG PO TABS: 40.0000 mg | ORAL_TABLET | Freq: Two times a day (BID) | ORAL | 6 refills | Status: DC

## 2019-12-22 MED ORDER — DIGOXIN 125 MCG PO TABS: 0.1250 mg | ORAL_TABLET | Freq: Every day | ORAL | 3 refills | Status: DC

## 2019-12-22 MED ORDER — CARVEDILOL 6.25 MG PO TABS: 6.2500 mg | ORAL_TABLET | Freq: Two times a day (BID) | ORAL | 3 refills | Status: DC

## 2019-12-22 MED ORDER — AMIODARONE HCL 200 MG PO TABS: 100.0000 mg | ORAL_TABLET | Freq: Every day | ORAL | 3 refills | Status: DC

## 2019-12-22 MED ORDER — BIDIL 20-37.5 MG PO TABS: 1.0000 | ORAL_TABLET | Freq: Every day | ORAL | 3 refills | Status: DC

## 2019-12-24 ENCOUNTER — Other Ambulatory Visit (HOSPITAL_COMMUNITY): Payer: Self-pay | Admitting: Internal Medicine

## 2019-12-25 ENCOUNTER — Telehealth (HOSPITAL_COMMUNITY): Payer: Self-pay | Admitting: Pharmacy Technician

## 2019-12-25 NOTE — Telephone Encounter (Signed)
Patient Advocate Encounter   Received notification from [facility] that prior authorization for Tara Bradshaw is required.   PA submitted on CoverMyMeds Key [id number] Status is pending   Will continue to follow.

## 2019-12-28 NOTE — Telephone Encounter (Signed)
Advanced Heart Failure Patient Advocate Encounter  Prior Authorization for [facility] has been approved.    PA# [id number] Effective dates: 12/25/19 through 12/24/20  Charlann Boxer, CPhT

## 2020-01-04 ENCOUNTER — Ambulatory Visit (INDEPENDENT_AMBULATORY_CARE_PROVIDER_SITE_OTHER): Payer: Medicaid Other

## 2020-01-04 DIAGNOSIS — I5022 Chronic systolic (congestive) heart failure: Secondary | ICD-10-CM

## 2020-01-04 DIAGNOSIS — Z95810 Presence of automatic (implantable) cardiac defibrillator: Secondary | ICD-10-CM | POA: Diagnosis not present

## 2020-01-05 ENCOUNTER — Telehealth: Payer: Self-pay

## 2020-01-05 NOTE — Progress Notes (Signed)
EPIC Encounter for ICM Monitoring  Patient Name: Tara Bradshaw is a 34 y.o. female Date: 01/05/2020 Primary Care Physican: Patient, No Pcp Per Primary Cardiologist:Bensimhon Electrophysiologist:Allred 11/24/2019 Weight:258lbs    Attempted call to patient and unable to reach.  Left detailed message per DPR regarding transmission. Transmission reviewed.   Corvue thoracic impedancenormal.  8/21 - 9/4 no Corvue Recording.  Prescribed:   Furosemide40 mg take 1 tablet by mouth twice a day.   Potassium 20 mEq take 1 tablet daily.  Recommendations: No changes and encouraged to call if experiencing any fluid symptoms.  Follow-up plan: ICM clinic phone appointment on11/04/2019. 91 day device clinic remote transmission11/19/2021.  EP/Cardiology Office Visits:Overdue to make appt with Dr Haroldine Laws (pt canceled 11/18/19 appt).  Recall 04/27/2020 with Dr Rayann Heman.  Copy of ICM check sent to Dr.Allred.   Direct Trend Viewer through 01/03/2020   3 month ICM trend: 12/28/2019    1 Year ICM trend:       [name], RN 01/05/2020 4:09 PM

## 2020-01-05 NOTE — Telephone Encounter (Signed)
Remote ICM transmission received.  Attempted call to patient regarding ICM remote transmission and call disconnected after answering.

## 2020-01-18 ENCOUNTER — Encounter (HOSPITAL_COMMUNITY): Payer: Medicaid Other | Admitting: Internal Medicine

## 2020-01-22 ENCOUNTER — Encounter (HOSPITAL_COMMUNITY): Payer: Medicaid Other | Admitting: Internal Medicine

## 2020-02-08 ENCOUNTER — Ambulatory Visit (INDEPENDENT_AMBULATORY_CARE_PROVIDER_SITE_OTHER): Payer: Medicaid Other

## 2020-02-08 DIAGNOSIS — I5022 Chronic systolic (congestive) heart failure: Secondary | ICD-10-CM

## 2020-02-08 DIAGNOSIS — Z95810 Presence of automatic (implantable) cardiac defibrillator: Secondary | ICD-10-CM

## 2020-02-10 NOTE — Progress Notes (Signed)
EPIC Encounter for ICM Monitoring  Patient Name: Tara Bradshaw is a 34 y.o. female Date: 02/10/2020 Primary Care Physican: Patient, No Pcp Per Primary Cardiologist:Bensimhon Electrophysiologist:Allred 11/24/2019 Weight:258lbs   Transmission reviewed.   Corvue thoracic impedancenormal.  Prescribed:   Furosemide40 mg take 1 tablet by mouth twice a day.   Potassium 20 mEq take 1 tablet daily.  Recommendations: No changes.  Follow-up plan: ICM clinic phone appointment on12/09/2019. 91 day device clinic remote transmission11/19/2021.  EP/Cardiology Office Visits:02/17/2020 with Dr Haroldine Laws.  Recall 04/27/2020 with Dr Rayann Heman.  Copy of ICM check sent to Dr.Allred.    3 month ICM trend: 02/08/2020    1 Year ICM trend:       [name], RN 02/10/2020 3:17 PM

## 2020-02-17 ENCOUNTER — Ambulatory Visit (HOSPITAL_COMMUNITY)
Admission: RE | Admit: 2020-02-17 | Discharge: 2020-02-17 | Disposition: A | Discharge status: Home / Self Care | Payer: Medicaid Other | Source: Ambulatory Visit | Attending: Internal Medicine | Admitting: Internal Medicine

## 2020-02-17 ENCOUNTER — Encounter (HOSPITAL_COMMUNITY): Payer: Self-pay | Admitting: Internal Medicine

## 2020-02-17 ENCOUNTER — Other Ambulatory Visit: Payer: Self-pay

## 2020-02-17 VITALS — BP 160/90 | HR 63 | Wt 248.8 lb

## 2020-02-17 DIAGNOSIS — I493 Ventricular premature depolarization: Secondary | ICD-10-CM | POA: Insufficient documentation

## 2020-02-17 DIAGNOSIS — Z95810 Presence of automatic (implantable) cardiac defibrillator: Secondary | ICD-10-CM | POA: Diagnosis not present

## 2020-02-17 DIAGNOSIS — Z79899 Other long term (current) drug therapy: Secondary | ICD-10-CM | POA: Diagnosis not present

## 2020-02-17 DIAGNOSIS — Z9989 Dependence on other enabling machines and devices: Secondary | ICD-10-CM | POA: Insufficient documentation

## 2020-02-17 DIAGNOSIS — Z8249 Family history of ischemic heart disease and other diseases of the circulatory system: Secondary | ICD-10-CM | POA: Diagnosis not present

## 2020-02-17 DIAGNOSIS — G4733 Obstructive sleep apnea (adult) (pediatric): Secondary | ICD-10-CM | POA: Diagnosis not present

## 2020-02-17 DIAGNOSIS — I472 Ventricular tachycardia: Secondary | ICD-10-CM | POA: Diagnosis not present

## 2020-02-17 DIAGNOSIS — I5022 Chronic systolic (congestive) heart failure: Secondary | ICD-10-CM | POA: Diagnosis not present

## 2020-02-17 DIAGNOSIS — R42 Dizziness and giddiness: Secondary | ICD-10-CM | POA: Insufficient documentation

## 2020-02-17 DIAGNOSIS — I110 Hypertensive heart disease with heart failure: Secondary | ICD-10-CM | POA: Insufficient documentation

## 2020-02-17 DIAGNOSIS — I428 Other cardiomyopathies: Secondary | ICD-10-CM | POA: Diagnosis not present

## 2020-02-17 DIAGNOSIS — R519 Headache, unspecified: Secondary | ICD-10-CM | POA: Insufficient documentation

## 2020-02-17 DIAGNOSIS — I5023 Acute on chronic systolic (congestive) heart failure: Secondary | ICD-10-CM | POA: Diagnosis not present

## 2020-02-17 DIAGNOSIS — Z793 Long term (current) use of hormonal contraceptives: Secondary | ICD-10-CM | POA: Insufficient documentation

## 2020-02-17 HISTORY — DX: Heart failure, unspecified: I50.9

## 2020-02-17 LAB — COMPREHENSIVE METABOLIC PANEL
ALT: 16 U/L (ref 0–44)
AST: 17 U/L (ref 15–41)
Albumin: 4.1 g/dL (ref 3.5–5.0)
Alkaline Phosphatase: 53 U/L (ref 38–126)
Anion gap: 9 (ref 5–15)
BUN: 9 mg/dL (ref 6–20)
CO2: 24 mmol/L (ref 22–32)
Calcium: 9.5 mg/dL (ref 8.9–10.3)
Chloride: 106 mmol/L (ref 98–111)
Creatinine, Ser: 0.55 mg/dL (ref 0.44–1.00)
GFR, Estimated: >60 mL/min (ref >60)
Glucose, Bld: 93 mg/dL (ref 70–99)
Potassium: 3.6 mmol/L (ref 3.5–5.1)
Sodium: 139 mmol/L (ref 135–145)
Total Bilirubin: 0.7 mg/dL (ref 0.3–1.2)
Total Protein: 7.4 g/dL (ref 6.5–8.1)

## 2020-02-17 LAB — CBC
HCT: 37.5 % (ref 36.0–46.0)
Hemoglobin: 12 g/dL (ref 12.0–15.0)
MCH: 26.5 pg (ref 26.0–34.0)
MCHC: 32 g/dL (ref 30.0–36.0)
MCV: 82.8 fL (ref 80.0–100.0)
Platelets: 243 10*3/uL (ref 150–400)
RBC: 4.53 MIL/uL (ref 3.87–5.11)
RDW: 14.4 % (ref 11.5–15.5)
WBC: 6.6 10*3/uL (ref 4.0–10.5)
nRBC: 0 % (ref 0.0–0.2)

## 2020-02-17 LAB — TSH: TSH: 0.272 u[IU]/mL — ABNORMAL LOW (ref 0.350–4.500)

## 2020-02-17 LAB — T4, FREE: Free T4: 0.76 ng/dL (ref 0.61–1.12)

## 2020-02-17 MED ORDER — BIDIL 20-37.5 MG PO TABS: 1.0000 | ORAL_TABLET | Freq: Two times a day (BID) | ORAL | 3 refills | Status: DC

## 2020-02-17 NOTE — Addendum Note (Signed)
Encounter addended by: Jolaine Artist, MD on: 02/17/2020 12:14 PM  Actions taken: Level of Service modified, Visit diagnoses modified

## 2020-02-17 NOTE — Patient Instructions (Addendum)
Increase Bidil to 1 tab Twice daily   Labs done today, your results will be available in MyChart, we will contact you for abnormal readings.  Your physician has requested that you have an echocardiogram. Echocardiography is a painless test that uses sound waves to create images of your heart. It provides your doctor with information about the size and shape of your heart and how well your heart's chambers and valves are working. This procedure takes approximately one hour. There are no restrictions for this procedure.  Your physician has recommended that you have a cardiopulmonary stress test (CPX). CPX testing is a non-invasive measurement of heart and lung function. It replaces a traditional treadmill stress test. This type of test provides a tremendous amount of information that relates not only to your present condition but also for future outcomes. This test combines measurements of you ventilation, respiratory gas exchange in the lungs, electrocardiogram (EKG), blood pressure and physical response before, during, and following an exercise protocol.  Your physician recommends that you schedule a follow-up appointment in: 3 months  If you have any questions or concerns before your next appointment please send [place] a message through [place] or call our office at [phone].    TO LEAVE A MESSAGE FOR THE NURSE SELECT OPTION 2, PLEASE LEAVE A MESSAGE INCLUDING: . YOUR NAME . DATE OF BIRTH . CALL BACK NUMBER . REASON FOR CALL**this is important as we prioritize the call backs  [place] AS LONG AS YOU CALL BEFORE 4:00 PM  At the [facility], you and your health needs are our priority. As part of our continuing mission to provide you with exceptional heart care, we have created designated Provider Care Teams. These Care Teams include your primary Cardiologist (physician) and Advanced Practice Providers (APPs- Physician Assistants and Nurse  Practitioners) who all work together to provide you with the care you need, when you need it.   You may see any of the following providers on your designated Care Team at your next follow up: Marland Kitchen Dr Glori Bickers . Dr Loralie Champagne . Darrick Grinder, NP . Lyda Jester, PA . Audry Riles, PharmD   Please be sure to bring in all your medications bottles to every appointment.

## 2020-02-17 NOTE — Progress Notes (Addendum)
Advanced Heart Failure Clinic Note   Date:  02/17/2020   ID:  Tara Bradshaw, DOB 1986-01-26, MRN [id number]  Location: Home  Provider location: [facility] Type of Visit: Established patient  PCP:  Patient, No Pcp Per  Cardiologist:  No primary care provider on file. Primary HF: Dr. Haroldine Laws  Chief Complaint: f/u for Chronic Systolic Heart Failure    History of Present Illness:  Tara Bradshaw is a 34 y/o woman with h/o mild HTN who was admitted in 3/20 with acute systolic HF with EF 79-39% in setting of recent viral illness and 25-month post-partum status. COVID testing negative. Did not undergo cath as felt to be NICM   ZioPatch 8/20 NSR. Occasional PVCs (3.5%) 9-beat run NSVT   Had return visit w/ Dr. Haroldine Laws 01/15/19. Endorsed fatigue after starting carvedilol. Struggled w/ ADLs. No edema or PND. Noted mild orthopnea and symptoms c/w vertigo. Echo was repeated at that visit and EF was 25-30% (read as 30-35%). She was set up for [facility] on 01/22/19 which showed severe NICM EF 20%, Normal coronaries, Well compensated hemodynamics with high cardiac output.   Seen in Clinic 4/21 and CPX test ordered due to persistent NYHA III symptoms but not completed.   She presents back to clinic today for f/u. Says she is still very SOB. Struggles with housework and grocery shopping. Mild edema in R ankle. Also has HAs, nausea and dizziness. Taking all meds.   Echo 10/20: EF 25-30% (read as 30-35%)    Past Medical History:  Diagnosis Date  . Anemia    CHRONIC  . CHF (congestive heart failure) ([place])   . Chronic systolic dysfunction of left ventricle   . GERD (gastroesophageal reflux disease)    WITH PREGNANCY  . Nonischemic cardiomyopathy ([place])   . Pneumonia    x 1  . PVC's (premature ventricular contractions)    Past Surgical History:  Procedure Laterality Date  . DILATION AND EVACUATION N/A 11/30/2016   Procedure: DILATATION AND EVACUATION;  Surgeon:  Donnamae Jude, MD;  Location: [facility];  Service: Gynecology;  Laterality: N/A;  . ICD IMPLANT N/A 02/19/2019   Procedure: ICD IMPLANT;  Surgeon: Thompson Grayer, MD;  Location: [facility];  Service: Cardiovascular;  Laterality: N/A;  . LEAD REVISION/REPAIR N/A 04/16/2019   Procedure: LEAD REVISION/REPAIR;  Surgeon: Thompson Grayer, MD;  Location: [facility];  Service: Cardiovascular;  Laterality: N/A;  . RIGHT/LEFT HEART CATH AND CORONARY ANGIOGRAPHY N/A 01/22/2019   Procedure: RIGHT/LEFT HEART CATH AND CORONARY ANGIOGRAPHY;  Surgeon: Jolaine Artist, MD;  Location: [facility];  Service: Cardiovascular;  Laterality: N/A;  . WISDOM TOOTH EXTRACTION     NO ANESTHESIA     Current Outpatient Medications  Medication Sig Dispense Refill  . acetaminophen (TYLENOL) 325 MG tablet Take 650 mg by mouth every 6 (six) hours as needed for mild pain or headache.    Marland Kitchen amiodarone (PACERONE) 200 MG tablet Take 0.5 tablets (100 mg total) by mouth daily. 45 tablet 3  . carvedilol (COREG) 6.25 MG tablet Take 1 tablet (6.25 mg total) by mouth 2 (two) times daily. 60 tablet 3  . digoxin (LANOXIN) 0.125 MG tablet Take 1 tablet (0.125 mg total) by mouth daily. 30 tablet 3  . ENTRESTO 97-103 MG TAKE 1 TABLET BY MOUTH TWICE A DAY 60 tablet 6  . furosemide (LASIX) 40 MG tablet Take 1 tablet (40 mg total) by mouth 2 (two) times  daily. 60 tablet 6  . isosorbide-hydrALAZINE (BIDIL) 20-37.5 MG tablet Take 1 tablet by mouth daily. 90 tablet 3  . levonorgestrel (MIRENA) 20 MCG/24HR IUD 1 each by Intrauterine route once.    . meclizine (ANTIVERT) 25 MG tablet Take 1 tablet (25 mg total) by mouth 3 (three) times daily as needed for dizziness. 15 tablet 0  . potassium chloride SA (KLOR-CON) 20 MEQ tablet Take 1 tablet (20 mEq total) by mouth daily. 90 tablet 3  . spironolactone (ALDACTONE) 25 MG tablet TAKE 1 TABLET BY MOUTH EVERY DAY 30 tablet 6   No current facility-administered medications for this  encounter.    Allergies:   Patient has no known allergies.   Social History:  The patient  reports that she has never smoked. She has never used smokeless tobacco. She reports that she does not drink alcohol and does not use drugs.   Family History:  The patient's family history includes Asthma in her mother; Cancer in her paternal grandmother; Cancer (age of onset: 42) in her maternal aunt; Epilepsy in her mother; Hypertension in her maternal grandmother.   ROS:  Please see the history of present illness.   All other systems are personally reviewed and negative.   Vitals:   02/17/20 1118  BP: (!) 160/90  Pulse: 63  SpO2: 93%    PHYSICAL EXAM: General:  Obese woman No resp difficulty HEENT: normal Neck: supple. no JVD. Carotids 2+ bilat; no bruits. No lymphadenopathy or thryomegaly appreciated. Cor: PMI nondisplaced. Regular rate & rhythm. No rubs, gallops or murmurs. Lungs: clear Abdomen: obese soft, nontender, nondistended. No hepatosplenomegaly. No bruits or masses. Good bowel sounds. Extremities: no cyanosis, clubbing, rash, edema Neuro: alert & orientedx3, cranial nerves grossly intact. moves all 4 extremities w/o difficulty. Affect pleasant     Recent Labs: 03/16/2019: B Natriuretic Peptide 207.8 04/16/2019: Hemoglobin 12.5; Platelets 275 07/20/2019: ALT 17; TSH 0.582 10/22/2019: BUN 7; Creatinine, Ser 0.65; Magnesium 1.8; Potassium 3.4; Sodium 140  Personally reviewed   Wt Readings from Last 3 Encounters:  02/17/20 112.9 kg (248 lb 12.8 oz)  10/29/19 (!) 113.4 kg (250 lb)  08/07/19 117.9 kg (260 lb)    ICD interrogated personally: 1 brief NSVT. I brief SVT. Fluid ok. No shocks. No AF Personally reviewed    ASSESSMENT AND PLAN:  1. Acute systolic HF - Diagnosed 0/34. Echo LVEF 25% with moderate RV dysfunction - NICM. Either viral (had URI in week prior to admission and respiratory panel + rhinovirus) or post-partum or PVC cardiomyopathy. [place] 10/20 showed normal  cors. RHC showed well compensated hemodynamics with high cardiac output. No evidence of intracardiac shunting.  - Echo 10/20 EF 25-30%.  - Now has ICD followed by Dr. Rayann Heman.   - Volume status good today. Volume has been good (ReDs normal) but continues to struggle with NYHA III symptoms (seems out of proportion to objective findings) - Will arrange repeat echo and CPX for further assessment  - Continue lasix 40 bid - Continue entresto to 97/103. On contraception (Mirena) - Continue digoxin 0.125. check dig level today  - Continue spiro 25 daily - Has been on Bidil 1 tab daily (has been intolerant to tid dosing due to HAs and dizziness/ low BP). Will try to increase to BID - Continue carvedilol 3.125 bid  - Not candidate for cMRI due to size - Consider future addition of a SGLT2i (will hold off for now given euvolemic status, soft BP and difficulties tolerating tid bidil due to "dizziness"  w/ low BP concerning for orthostatic hypotension).   2. OSA - Had WatchPat study 07/30/18 has moderate OSA (AHI 15.3). - Using CPAP "off and on" - we discussed importance of strict compliance w/ CPAP given HF and PVC history.    3. PVCs/Palpitations - In hospital was averaging about 8-10 PVCs per minute. May be contributing to LV dysfunction - ZioPatch 9/20. NSR one 9-beat run NSVT. PVC 3.5% - Continue low dose amio 200 daily for suppression - check amio labs  4. HTN - remains elevated - increase Bidil to 1 tab bid and see if she can tolerate - will keep BP log for me - may need to add amlodipine   Signed, Glori Bickers, MD  02/17/2020 11:32 AM  Advanced Heart Failure [place] [address] [phone] (office) [phone] (fax)

## 2020-02-17 NOTE — Addendum Note (Signed)
Encounter addended by: Jolaine Artist, MD on: 02/17/2020 12:17 PM  Actions taken: Clinical Note Signed, Charge Capture section accepted

## 2020-02-18 LAB — T3, FREE: T3, Free: 3.1 pg/mL (ref 2.0–4.4)

## 2020-02-22 DIAGNOSIS — G4733 Obstructive sleep apnea (adult) (pediatric): Secondary | ICD-10-CM | POA: Diagnosis not present

## 2020-02-25 ENCOUNTER — Ambulatory Visit: Payer: Medicaid Other

## 2020-02-25 ENCOUNTER — Encounter: Payer: Medicaid Other | Admitting: Internal Medicine

## 2020-02-26 ENCOUNTER — Ambulatory Visit (INDEPENDENT_AMBULATORY_CARE_PROVIDER_SITE_OTHER): Payer: Medicaid Other

## 2020-02-26 DIAGNOSIS — I429 Cardiomyopathy, unspecified: Secondary | ICD-10-CM

## 2020-02-26 LAB — CUP PACEART REMOTE DEVICE CHECK
Battery Remaining Longevity: 87 mo
Battery Remaining Percentage: 87 %
Battery Voltage: 2.99 V
Brady Statistic RV Percent Paced: 0 %
Date Time Interrogation Session: 20211119010102
HighPow Impedance: 68 Ohm
Implantable Lead Implant Date: 20210107
Implantable Lead Location: 753860
Implantable Pulse Generator Implant Date: 20201112
Lead Channel Impedance Value: 330 Ohm
Lead Channel Pacing Threshold Amplitude: 1 V
Lead Channel Pacing Threshold Pulse Width: 0.5 ms
Lead Channel Sensing Intrinsic Amplitude: 11.9 mV
Lead Channel Setting Pacing Amplitude: 3.5 V
Lead Channel Setting Pacing Pulse Width: 0.5 ms
Lead Channel Setting Sensing Sensitivity: 0.5 mV
Pulse Gen Serial Number: 111012702

## 2020-02-29 NOTE — Progress Notes (Signed)
Remote ICD transmission.   

## 2020-03-14 ENCOUNTER — Ambulatory Visit (INDEPENDENT_AMBULATORY_CARE_PROVIDER_SITE_OTHER): Payer: Medicaid Other

## 2020-03-14 DIAGNOSIS — I5022 Chronic systolic (congestive) heart failure: Secondary | ICD-10-CM

## 2020-03-14 DIAGNOSIS — Z95810 Presence of automatic (implantable) cardiac defibrillator: Secondary | ICD-10-CM

## 2020-03-16 NOTE — Progress Notes (Signed)
EPIC Encounter for ICM Monitoring  Patient Name: Tara Bradshaw is a 34 y.o. female Date: 03/16/2020 Primary Care Physican: Patient, No Pcp Per Primary Cardiologist:Bensimhon Electrophysiologist:Allred 02/17/2020 Office Weight:248lbs   Spoke with patient and reports feeling well at this time.  Denies fluid symptoms.    [place] thoracic impedancenormal.  Prescribed:   Furosemide40 mg take 1 tablet by mouth twice a day.   Potassium 20 mEq take 1 tablet daily.  Recommendations:No changes and encouraged to call if experiencing any fluid symptoms.  Follow-up plan: ICM clinic phone appointment on1/02/2021. 91 day device clinic remote transmission2/18/2022.  EP/Cardiology Office Visits:05/19/2020 with Dr Haroldine Laws. Recall 04/27/2020 with Dr Rayann Heman.  Copy of ICM check sent to Dr.Allred.  3 month ICM trend: 03/14/2020    1 Year ICM trend:       [name], RN 03/16/2020 9:22 AM

## 2020-03-17 ENCOUNTER — Ambulatory Visit (HOSPITAL_COMMUNITY)
Admission: RE | Admit: 2020-03-17 | Discharge: 2020-03-17 | Disposition: A | Discharge status: Home / Self Care | Payer: Medicaid Other | Source: Ambulatory Visit | Attending: Internal Medicine | Admitting: Internal Medicine

## 2020-03-17 ENCOUNTER — Ambulatory Visit (HOSPITAL_COMMUNITY): Payer: Medicaid Other

## 2020-03-17 ENCOUNTER — Other Ambulatory Visit: Payer: Self-pay

## 2020-03-17 ENCOUNTER — Telehealth (HOSPITAL_COMMUNITY): Payer: Self-pay | Admitting: *Deleted

## 2020-03-17 DIAGNOSIS — I429 Cardiomyopathy, unspecified: Secondary | ICD-10-CM | POA: Insufficient documentation

## 2020-03-17 DIAGNOSIS — I5022 Chronic systolic (congestive) heart failure: Secondary | ICD-10-CM | POA: Insufficient documentation

## 2020-03-17 DIAGNOSIS — I340 Nonrheumatic mitral (valve) insufficiency: Secondary | ICD-10-CM | POA: Insufficient documentation

## 2020-03-17 LAB — ECHOCARDIOGRAM COMPLETE
Area-P 1/2: 3.85 cm2
S' Lateral: 5.9 cm
Single Plane A2C EF: 31.8 %

## 2020-03-17 NOTE — Telephone Encounter (Signed)
Pt called the office shortly after completing her cpx c/o headache and bp 140/96.Per Silas Flood Simmons,PA have pt take tylenol or headache, encourage compliance with CPAP, and follow up with Dr.Bensimhon about possible medication changes Bidil may be the cause of headache. Pt c/o headache, dizziness, and nausea at last office visit with Dr.Bensimhon.  Pt aware and will expect return call after I speak with Dr.Bensimhon  Routed to [place]

## 2020-03-17 NOTE — Progress Notes (Signed)
  Echocardiogram 2D Echocardiogram has been performed.  Tara Bradshaw 03/17/2020, 8:44 AM

## 2020-03-17 NOTE — Telephone Encounter (Signed)
Likely Bidil causing HAs. Can try tylenol and see if helps.

## 2020-04-01 ENCOUNTER — Other Ambulatory Visit: Payer: Self-pay | Admitting: Internal Medicine

## 2020-04-01 DIAGNOSIS — I429 Cardiomyopathy, unspecified: Secondary | ICD-10-CM

## 2020-04-01 DIAGNOSIS — I493 Ventricular premature depolarization: Secondary | ICD-10-CM

## 2020-04-01 DIAGNOSIS — Z95810 Presence of automatic (implantable) cardiac defibrillator: Secondary | ICD-10-CM

## 2020-04-01 DIAGNOSIS — I471 Supraventricular tachycardia: Secondary | ICD-10-CM

## 2020-04-01 DIAGNOSIS — I428 Other cardiomyopathies: Secondary | ICD-10-CM

## 2020-04-19 ENCOUNTER — Ambulatory Visit (INDEPENDENT_AMBULATORY_CARE_PROVIDER_SITE_OTHER): Payer: Medicaid Other

## 2020-04-19 ENCOUNTER — Encounter (HOSPITAL_COMMUNITY): Payer: Self-pay

## 2020-04-19 DIAGNOSIS — I5022 Chronic systolic (congestive) heart failure: Secondary | ICD-10-CM | POA: Diagnosis not present

## 2020-04-19 DIAGNOSIS — Z95810 Presence of automatic (implantable) cardiac defibrillator: Secondary | ICD-10-CM

## 2020-04-19 NOTE — Progress Notes (Signed)
EPIC Encounter for ICM Monitoring  Patient Name: Tara Bradshaw is a 35 y.o. female Date: 04/19/2020 Primary Care Physican: Patient, No Pcp Per Primary Cardiologist:Bensimhon Electrophysiologist:Allred 02/17/2020 Office Weight:248lbs   Spoke with patient.  She just got over COVID the 2nd time and she is having some congesting in the chest area.  She has not weighed recently but will do so for the next few days.Jeannine Kitten thoracic impedancesuggesting possible fluid accumulation since 04/15/2020.  Prescribed:   Furosemide40 mg take 1 tablet by mouth twice a day.   Potassium 20 mEq take 1 tablet daily.  Labs: 02/17/2020 Creatinine 0.55, BUN 9, Potassium 3.6, Sodium 139  10/22/2019 Creatinine 0.65, BUN 7, Potassium 3.4, Sodium 140, GFR 115-133  07/20/2019 Creatinine 0.63, BUN 8, Potassium 3.8, Sodium 137, GFR >60  A complete set of results can be found in Results Review.  Recommendations:Advised to take 1 extra Furosemide and 1 extra Potassium x 2 days and then resume prescribed dosage  Follow-up plan: ICM clinic phone appointment on1/14/2022 (manual) to recheck fluid levels. 91 day device clinic remote transmission2/18/2022.  EP/Cardiology Office Visits:05/19/2020 with Dr Haroldine Laws. Recall 04/27/2020 with Dr Rayann Heman.  Copy of ICM check sent to Dr.Allred and Dr Haroldine Laws.  3 month ICM trend: 04/19/2020.    1 Year ICM trend:       [name], RN 04/19/2020 8:24 AM

## 2020-04-26 NOTE — Progress Notes (Signed)
No ICM remote transmission received for 04/25/2020 and next ICM transmission scheduled for 05/23/2020.   

## 2020-04-27 ENCOUNTER — Encounter (HOSPITAL_COMMUNITY): Payer: Self-pay | Admitting: *Deleted

## 2020-04-27 NOTE — Progress Notes (Signed)
Received signed ROI, cardiac medical statement, and record request from [employer] who represent pt in her claim for [employer] Disability. Form completed and signed by Dr Haroldine Laws and faxed with records back to them at [phone], atten Burley Saver

## 2020-05-05 ENCOUNTER — Telehealth (HOSPITAL_COMMUNITY): Payer: Self-pay

## 2020-05-05 NOTE — Telephone Encounter (Signed)
Received a fax requesting medical records from [employer]. Records were successfully faxed to: [phone] ,which was the number provided.. Medical request form will be scanned into patients chart.

## 2020-05-19 ENCOUNTER — Ambulatory Visit (HOSPITAL_COMMUNITY)
Admission: RE | Admit: 2020-05-19 | Discharge: 2020-05-19 | Disposition: A | Discharge status: Home / Self Care | Payer: Medicaid Other | Source: Ambulatory Visit | Attending: Internal Medicine | Admitting: Internal Medicine

## 2020-05-19 ENCOUNTER — Other Ambulatory Visit: Payer: Self-pay

## 2020-05-19 ENCOUNTER — Encounter (HOSPITAL_COMMUNITY): Payer: Self-pay | Admitting: Internal Medicine

## 2020-05-19 ENCOUNTER — Other Ambulatory Visit (HOSPITAL_COMMUNITY): Payer: Self-pay | Admitting: Internal Medicine

## 2020-05-19 VITALS — BP 144/90 | HR 71 | Wt 254.8 lb

## 2020-05-19 DIAGNOSIS — I110 Hypertensive heart disease with heart failure: Secondary | ICD-10-CM | POA: Diagnosis not present

## 2020-05-19 DIAGNOSIS — I472 Ventricular tachycardia: Secondary | ICD-10-CM | POA: Insufficient documentation

## 2020-05-19 DIAGNOSIS — Z793 Long term (current) use of hormonal contraceptives: Secondary | ICD-10-CM | POA: Insufficient documentation

## 2020-05-19 DIAGNOSIS — Z79899 Other long term (current) drug therapy: Secondary | ICD-10-CM | POA: Insufficient documentation

## 2020-05-19 DIAGNOSIS — Z7984 Long term (current) use of oral hypoglycemic drugs: Secondary | ICD-10-CM | POA: Insufficient documentation

## 2020-05-19 DIAGNOSIS — Z8249 Family history of ischemic heart disease and other diseases of the circulatory system: Secondary | ICD-10-CM | POA: Diagnosis not present

## 2020-05-19 DIAGNOSIS — I428 Other cardiomyopathies: Secondary | ICD-10-CM | POA: Insufficient documentation

## 2020-05-19 DIAGNOSIS — I5022 Chronic systolic (congestive) heart failure: Secondary | ICD-10-CM | POA: Diagnosis not present

## 2020-05-19 DIAGNOSIS — G4733 Obstructive sleep apnea (adult) (pediatric): Secondary | ICD-10-CM | POA: Diagnosis not present

## 2020-05-19 DIAGNOSIS — I493 Ventricular premature depolarization: Secondary | ICD-10-CM | POA: Diagnosis not present

## 2020-05-19 LAB — BASIC METABOLIC PANEL
Anion gap: 10 (ref 5–15)
BUN: 7 mg/dL (ref 6–20)
CO2: 24 mmol/L (ref 22–32)
Calcium: 9.2 mg/dL (ref 8.9–10.3)
Chloride: 105 mmol/L (ref 98–111)
Creatinine, Ser: 0.61 mg/dL (ref 0.44–1.00)
GFR, Estimated: >60 mL/min (ref >60)
Glucose, Bld: 97 mg/dL (ref 70–99)
Potassium: 4 mmol/L (ref 3.5–5.1)
Sodium: 139 mmol/L (ref 135–145)

## 2020-05-19 LAB — URIC ACID: Uric Acid, Serum: 4.5 mg/dL (ref 2.5–7.1)

## 2020-05-19 LAB — BRAIN NATRIURETIC PEPTIDE: B Natriuretic Peptide: 83.9 pg/mL (ref 0.0–100.0)

## 2020-05-19 MED ORDER — AMLODIPINE BESYLATE 10 MG PO TABS: 10.0000 mg | ORAL_TABLET | Freq: Every day | ORAL | 5 refills | Status: DC

## 2020-05-19 MED ORDER — DAPAGLIFLOZIN PROPANEDIOL 10 MG PO TABS: 10.0000 mg | ORAL_TABLET | Freq: Every day | ORAL | 3 refills | Status: DC

## 2020-05-19 MED ORDER — FUROSEMIDE 40 MG PO TABS: 20.0000 mg | ORAL_TABLET | Freq: Two times a day (BID) | ORAL | 6 refills | Status: DC

## 2020-05-19 MED ORDER — FUROSEMIDE 40 MG PO TABS: 40.0000 mg | ORAL_TABLET | Freq: Every day | ORAL | 6 refills | Status: DC

## 2020-05-19 NOTE — Progress Notes (Signed)
Advanced Heart Failure Clinic Note   Date:  05/19/2020   ID:  Tara Bradshaw, DOB [birth year], MRN [id number]  Location: Home  Provider location: [facility] Type of Visit: Established patient  PCP:  Patient, No Pcp Per  Cardiologist:  No primary care provider on file. Primary HF: Dr. Haroldine Laws  Chief Complaint: f/u for Chronic Systolic Heart Failure    History of Present Illness:  Tara Bradshaw is a 35 y/o woman with h/o mild HTN who was admitted in 3/20 with acute systolic HF with EF 84-69% in setting of recent viral illness and 59-month post-partum status. COVID testing negative.   ZioPatch 8/20 NSR. Occasional PVCs (3.5%) 9-beat run NSVT   Had return visit w/ Dr. Haroldine Laws 01/15/19. Endorsed fatigue after starting carvedilol.   R/LHC on 01/22/19 which showed severe NICM EF 20%, Normal coronaries, Well compensated hemodynamics with high cardiac output.   Since we last saw her stopped Bidil due to HAs.   CPX 12/21  FVC 2.68 (93%)    FEV1 2.27 (93%)     FEV1/FVC 85 (100%)     MVV 84 (84%)    Resting HR: 70 Standing HR: 70 Peak HR: 135  (73% age predicted max HR)   BP rest: 148/94 Standing BP: 126/92 BP peak: 166/84  Peak VO2: 14.9 (76% predicted peak VO2) When adjusted to the patient's ideal body weight of 127.7 lb (57.9 kg) the peak VO2 is 29.4 ml/kg (ibw)/min (91% of the ibw-adjusted predicted).  VE/VCO2 slope: 27  OUES: 2.33  Peak RER: 0.95  Ventilatory Threshold: 12.0 (61% predicted or measured peak VO2)   VE/MVV: 58%   PETCO2 at peak: 36   O2pulse: 13  (108% predicted O2pulse)   She presents back to clinic today for f/u. Says she continues to not feel well. Having HAs. Swelling in ankles. Gets dizzy spells 2-3x/week at any time. No palpitations. Lasts 5-7 mins and resolved. No syncope. Struggles with ADLs due to SOB and fatigue.   Echo 10/20: EF 25-30% (read as 30-35%) Echo 12/21: EF 25-30%    Past Medical  History:  Diagnosis Date  . Anemia    CHRONIC  . CHF (congestive heart failure) ([employer])   . Chronic systolic dysfunction of left ventricle   . GERD (gastroesophageal reflux disease)    WITH PREGNANCY  . Nonischemic cardiomyopathy ([place])   . Pneumonia    x 1  . PVC's (premature ventricular contractions)    Past Surgical History:  Procedure Laterality Date  . DILATION AND EVACUATION N/A 11/30/2016   Procedure: DILATATION AND EVACUATION;  Surgeon: Donnamae Jude, MD;  Location: [facility];  Service: Gynecology;  Laterality: N/A;  . ICD IMPLANT N/A 02/19/2019   Procedure: ICD IMPLANT;  Surgeon: Thompson Grayer, MD;  Location: [facility];  Service: Cardiovascular;  Laterality: N/A;  . LEAD REVISION/REPAIR N/A 04/16/2019   Procedure: LEAD REVISION/REPAIR;  Surgeon: Thompson Grayer, MD;  Location: [facility];  Service: Cardiovascular;  Laterality: N/A;  . RIGHT/LEFT HEART CATH AND CORONARY ANGIOGRAPHY N/A 01/22/2019   Procedure: RIGHT/LEFT HEART CATH AND CORONARY ANGIOGRAPHY;  Surgeon: Jolaine Artist, MD;  Location: [facility];  Service: Cardiovascular;  Laterality: N/A;  . WISDOM TOOTH EXTRACTION     NO ANESTHESIA     Current Outpatient Medications  Medication Sig Dispense Refill  . acetaminophen (TYLENOL) 325 MG tablet Take 650 mg by mouth every 6 (six) hours as needed for mild pain or  headache.    Marland Kitchen amiodarone (PACERONE) 200 MG tablet Take 0.5 tablets (100 mg total) by mouth daily. 45 tablet 3  . amLODipine (NORVASC) 10 MG tablet Take 1 tablet (10 mg total) by mouth daily. 30 tablet 5  . carvedilol (COREG) 6.25 MG tablet Take 1 tablet (6.25 mg total) by mouth 2 (two) times daily. 60 tablet 3  . digoxin (LANOXIN) 0.125 MG tablet Take 1 tablet (0.125 mg total) by mouth daily. 30 tablet 3  . ENTRESTO 97-103 MG TAKE 1 TABLET BY MOUTH TWICE A DAY 60 tablet 6  . isosorbide-hydrALAZINE (BIDIL) 20-37.5 MG tablet Take 1 tablet by mouth in the morning and at bedtime. 180 tablet 3   . levonorgestrel (MIRENA) 20 MCG/24HR IUD 1 each by Intrauterine route once.    . meclizine (ANTIVERT) 25 MG tablet Take 1 tablet (25 mg total) by mouth 3 (three) times daily as needed for dizziness. 15 tablet 0  . potassium chloride SA (KLOR-CON) 20 MEQ tablet Take 1 tablet (20 mEq total) by mouth daily. 90 tablet 3  . spironolactone (ALDACTONE) 25 MG tablet TAKE 1 TABLET BY MOUTH EVERY DAY 30 tablet 6  . FARXIGA 10 MG TABS tablet TAKE 1 TABLET BY MOUTH DAILY BEFORE BREAKFAST. 30 tablet 3  . furosemide (LASIX) 40 MG tablet Take 1 tablet (40 mg total) by mouth daily. Please cancel all previous orders for current medication. Change in dosage or pill size. 30 tablet 6   No current facility-administered medications for this encounter.    Allergies:   Patient has no known allergies.   Social History:  The patient  reports that she has never smoked. She has never used smokeless tobacco. She reports that she does not drink alcohol and does not use drugs.   Family History:  The patient's family history includes Asthma in her mother; Cancer in her paternal grandmother; Cancer (age of onset: 32) in her maternal aunt; Epilepsy in her mother; Hypertension in her maternal grandmother.   ROS:  Please see the history of present illness.   All other systems are personally reviewed and negative.   Vitals:   05/19/20 1056  BP: (!) 144/90  Pulse: 71  SpO2: 91%   Wt Readings from Last 3 Encounters:  05/19/20 115.6 kg (254 lb 12.8 oz)  02/17/20 112.9 kg (248 lb 12.8 oz)  10/29/19 (!) 113.4 kg (250 lb)    PHYSICAL EXAM: General:  Well appearing. No resp difficulty HEENT: normal Neck: supple. no JVD. Carotids 2+ bilat; no bruits. No lymphadenopathy or thryomegaly appreciated. Cor: PMI nondisplaced. Regular rate & rhythm. No rubs, gallops or murmurs. Lungs: clear Abdomen: obese soft, nontender, nondistended. No hepatosplenomegaly. No bruits or masses. Good bowel sounds. Extremities: no cyanosis,  clubbing, rash, edema Neuro: alert & orientedx3, cranial nerves grossly intact. moves all 4 extremities w/o difficulty. Affect pleasant   ECG: NSR 69 +LVH Personally reviewed  Recent Labs: 10/22/2019: Magnesium 1.8 02/17/2020: ALT 16; Hemoglobin 12.0; Platelets 243; TSH 0.272 05/19/2020: B Natriuretic Peptide 83.9; BUN 7; Creatinine, Ser 0.61; Potassium 4.0; Sodium 139  Personally reviewed   Wt Readings from Last 3 Encounters:  05/19/20 115.6 kg (254 lb 12.8 oz)  02/17/20 112.9 kg (248 lb 12.8 oz)  10/29/19 (!) 113.4 kg (250 lb)    ICD interrogated personally:  No VT/AF. Fluid ok. Activity level ~ 2hrs/day Personally reviewed   ASSESSMENT AND PLAN:  1. Chronic systolic HF - Diagnosed 5/46. Echo LVEF 25% with moderate RV dysfunction - NICM. Either viral (  had URI in week prior to admission and respiratory panel + rhinovirus) or post-partum or PVC cardiomyopathy. [place] 10/20 showed normal cors. RHC showed well compensated hemodynamics with high cardiac output. No evidence of intracardiac shunting.  - Echo 10/20 EF 25-30% - Echo 12/21 EF 25-30% - CPX 1/22 Peak VO2: 14.9 (76% predicted peak VO2) adjusted to ibw pVO2 is 29.4 ml/kg (ibw)/min (91% of the ibw-adjusted predicted). VE/VCO2 slope: 27  - Ongoing NYHA III symptoms but symptoms out of proportion to CPX and cath findings. -> refer CR - Volume status ok  - Decrease lasix 40 bid -> 40 daily (starting Farxiga) - Continue entresto to 97/103. On contraception (Mirena) - Continue digoxin 0.125. - Continue spiro 25 daily - Failed Bidil due to HAs - Continue carvedilol 3.125 bid  - Start Farxiga 10  - Not candidate for cMRI due to size - Refer to CR - Check labs   2. OSA - Had WatchPat study 07/30/18 has moderate OSA (AHI 15.3). - Using CPAP more consistently  - we discussed importance of strict compliance w/ CPAP given HF and PVC history.   3. PVCs/Palpitations - In hospital was averaging about 8-10 PVCs per minute. May be  contributing to LV dysfunction - ZioPatch 9/20. NSR one 9-beat run NSVT. PVC 3.5% - Continue low dose amio 200 daily ->  An stop soon (or cut down to 100 daily)  4. HTN - remains elevated - add amlodipine   Signed, Glori Bickers, MD  05/19/2020 10:29 PM  Advanced Heart Failure [place] [address] [facility] and [place] [address] [phone] (office) [phone] (fax)

## 2020-05-19 NOTE — Patient Instructions (Addendum)
Start Amlodipine 10 mg (1 tablet) Daily  Decrease Furosemide to 40 mg Daily  START Farxiga 10mg  (1 tablet) daily  You have been referred to Cardiac rehab. They will contact you to schedule an appointment  Labs done today, your results will be available in MyChart, we will contact you for abnormal readings.  Your physician recommends that you schedule a follow-up appointment in: 4 months  If you have any questions or concerns before your next appointment please send [place] a message through [place] or call our office at [phone].    TO LEAVE A MESSAGE FOR THE NURSE SELECT OPTION 2, PLEASE LEAVE A MESSAGE INCLUDING: . YOUR NAME . DATE OF BIRTH . CALL BACK NUMBER . REASON FOR CALL**this is important as we prioritize the call backs  YOU WILL RECEIVE A CALL BACK THE SAME DAY AS LONG AS YOU CALL BEFORE 4:00 PM

## 2020-05-20 ENCOUNTER — Telehealth (HOSPITAL_COMMUNITY): Payer: Self-pay | Admitting: Pharmacist

## 2020-05-20 NOTE — Telephone Encounter (Signed)
Advanced Heart Failure Patient Advocate Encounter  Prior Authorization for [place] has been approved.    PA# [id number] Effective dates: 05/20/20 through 05/20/21  Audry Riles, PharmD, BCPS, BCCP, CPP Heart Failure Clinic Pharmacist [phone]

## 2020-05-20 NOTE — Telephone Encounter (Signed)
Patient Advocate Encounter   Received notification from [facility] that prior authorization for Wilder Glade is required.   PA submitted on CoverMyMeds Key [id number] Status is pending   Will continue to follow.   Audry Riles, PharmD, BCPS, BCCP, CPP Heart Failure Clinic Pharmacist [phone]

## 2020-05-23 ENCOUNTER — Ambulatory Visit (INDEPENDENT_AMBULATORY_CARE_PROVIDER_SITE_OTHER): Payer: Medicaid Other

## 2020-05-23 DIAGNOSIS — I5022 Chronic systolic (congestive) heart failure: Secondary | ICD-10-CM | POA: Diagnosis not present

## 2020-05-23 DIAGNOSIS — Z95810 Presence of automatic (implantable) cardiac defibrillator: Secondary | ICD-10-CM

## 2020-05-27 ENCOUNTER — Ambulatory Visit (INDEPENDENT_AMBULATORY_CARE_PROVIDER_SITE_OTHER): Payer: Medicaid Other

## 2020-05-27 DIAGNOSIS — I429 Cardiomyopathy, unspecified: Secondary | ICD-10-CM

## 2020-05-27 DIAGNOSIS — I5022 Chronic systolic (congestive) heart failure: Secondary | ICD-10-CM

## 2020-05-27 LAB — CUP PACEART REMOTE DEVICE CHECK
Battery Remaining Longevity: 85 mo
Battery Remaining Percentage: 86 %
Battery Voltage: 2.99 V
Brady Statistic RV Percent Paced: 0 %
Date Time Interrogation Session: 20220218020206
HighPow Impedance: 66 Ohm
Implantable Lead Implant Date: 20210107
Implantable Lead Location: 753860
Implantable Pulse Generator Implant Date: 20201112
Lead Channel Impedance Value: 330 Ohm
Lead Channel Pacing Threshold Amplitude: 1 V
Lead Channel Pacing Threshold Pulse Width: 0.5 ms
Lead Channel Sensing Intrinsic Amplitude: 10.4 mV
Lead Channel Setting Pacing Amplitude: 3.5 V
Lead Channel Setting Pacing Pulse Width: 0.5 ms
Lead Channel Setting Sensing Sensitivity: 0.5 mV
Pulse Gen Serial Number: 111012702

## 2020-05-27 NOTE — Progress Notes (Signed)
EPIC Encounter for ICM Monitoring  Patient Name: Tara Bradshaw is a 35 y.o. female Date: 05/27/2020 Primary Care Physican: Patient, No Pcp Per Primary Cardiologist:Bensimhon Electrophysiologist:Allred 11/10/2021OfficeWeight:248lbs   Transmission reviewed.  Corvue thoracic impedancesuggesting normal fluid levels.  Prescribed:   Furosemide40 mg take 1 tablet by mouth daily.   Potassium 20 mEq take 1 tablet daily.  Labs: 05/19/2020 Creatinine 0.61, BUN 7, Potassium 4.0, Sodium 139 02/17/2020 Creatinine 0.55, BUN 9, Potassium 3.6, Sodium 139  10/22/2019 Creatinine 0.65, BUN 7, Potassium 3.4, Sodium 140, GFR 115-133  07/20/2019 Creatinine 0.63, BUN 8, Potassium 3.8, Sodium 137, GFR >60  A complete set of results can be found in Results Review.  Recommendations: No changes.   Follow-up plan: ICM clinic phone appointment on3/21/2022. 91 day device clinic remote transmission5/21/2022.  EP/Cardiology Office Visits: Recall 04/27/2020 with Dr Rayann Heman.  Copy of ICM check sent to Dr.Allred 3 month ICM trend: 05/27/2020.    1 Year ICM trend:       [name], RN 05/27/2020 10:00 AM

## 2020-05-31 NOTE — Progress Notes (Signed)
Remote ICD transmission.   

## 2020-06-01 ENCOUNTER — Telehealth (HOSPITAL_COMMUNITY): Payer: Self-pay

## 2020-06-01 NOTE — Telephone Encounter (Signed)
Pt insurance is active and benefits verified through Medicaid. Co-pay $3.00, DED $0.00/$0.00 met, out of pocket $0.00/$0.00 met, co-insurance 0%. No pre-authorization required. Passport, 06/01/20 @ 10:27AM, REF#[id number]  Will contact patient to see if she is interested in the Cardiac Rehab Program. 

## 2020-06-01 NOTE — Telephone Encounter (Signed)
Called patient to see if she is interested in the Cardiac Rehab Program. Patient expressed interest. Explained scheduling process and went over insurance, patient verbalized understanding. Will contact patient for scheduling once Medicaid order has been recv'ed.  Placed pt ppw in fax awaiting folder.

## 2020-07-08 NOTE — Progress Notes (Signed)
No ICM remote transmission received for 07/05/2020 and next ICM transmission scheduled for 07/25/2020.

## 2020-07-17 ENCOUNTER — Other Ambulatory Visit: Payer: Self-pay

## 2020-07-17 ENCOUNTER — Emergency Department (HOSPITAL_COMMUNITY)
Admission: EM | Admit: 2020-07-17 | Discharge: 2020-07-18 | Disposition: A | Discharge status: Home / Self Care | Payer: Medicaid Other | Attending: Emergency Medicine | Admitting: Emergency Medicine

## 2020-07-17 ENCOUNTER — Encounter (HOSPITAL_COMMUNITY): Payer: Self-pay

## 2020-07-17 DIAGNOSIS — R9431 Abnormal electrocardiogram [ECG] [EKG]: Secondary | ICD-10-CM | POA: Diagnosis not present

## 2020-07-17 DIAGNOSIS — S3991XA Unspecified injury of abdomen, initial encounter: Secondary | ICD-10-CM | POA: Diagnosis not present

## 2020-07-17 DIAGNOSIS — Z95810 Presence of automatic (implantable) cardiac defibrillator: Secondary | ICD-10-CM | POA: Diagnosis not present

## 2020-07-17 DIAGNOSIS — S20212A Contusion of left front wall of thorax, initial encounter: Secondary | ICD-10-CM | POA: Insufficient documentation

## 2020-07-17 DIAGNOSIS — K8070 Calculus of gallbladder and bile duct without cholecystitis without obstruction: Secondary | ICD-10-CM | POA: Insufficient documentation

## 2020-07-17 DIAGNOSIS — K8020 Calculus of gallbladder without cholecystitis without obstruction: Secondary | ICD-10-CM | POA: Diagnosis not present

## 2020-07-17 DIAGNOSIS — D1803 Hemangioma of intra-abdominal structures: Secondary | ICD-10-CM | POA: Insufficient documentation

## 2020-07-17 DIAGNOSIS — S299XXA Unspecified injury of thorax, initial encounter: Secondary | ICD-10-CM | POA: Diagnosis not present

## 2020-07-17 DIAGNOSIS — Z79899 Other long term (current) drug therapy: Secondary | ICD-10-CM | POA: Diagnosis not present

## 2020-07-17 DIAGNOSIS — R52 Pain, unspecified: Secondary | ICD-10-CM

## 2020-07-17 DIAGNOSIS — K7689 Other specified diseases of liver: Secondary | ICD-10-CM | POA: Diagnosis not present

## 2020-07-17 LAB — COMPREHENSIVE METABOLIC PANEL
ALT: 10 U/L (ref 0–44)
AST: 26 U/L (ref 15–41)
Albumin: 3.7 g/dL (ref 3.5–5.0)
Alkaline Phosphatase: 54 U/L (ref 38–126)
Anion gap: 8 (ref 5–15)
BUN: 10 mg/dL (ref 6–20)
CO2: 22 mmol/L (ref 22–32)
Calcium: 9 mg/dL (ref 8.9–10.3)
Chloride: 108 mmol/L (ref 98–111)
Creatinine, Ser: 0.67 mg/dL (ref 0.44–1.00)
GFR, Estimated: >60 mL/min (ref >60)
Glucose, Bld: 94 mg/dL (ref 70–99)
Potassium: 4.3 mmol/L (ref 3.5–5.1)
Sodium: 138 mmol/L (ref 135–145)
Total Bilirubin: 0.6 mg/dL (ref 0.3–1.2)
Total Protein: 6.7 g/dL (ref 6.5–8.1)

## 2020-07-17 LAB — I-STAT BETA HCG BLOOD, ED (MC, WL, AP ONLY): I-stat hCG, quantitative: <5 m[IU]/mL (ref <5)
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
## 2020-07-18 ENCOUNTER — Emergency Department (HOSPITAL_COMMUNITY): Payer: Medicaid Other

## 2020-07-18 DIAGNOSIS — K8020 Calculus of gallbladder without cholecystitis without obstruction: Secondary | ICD-10-CM | POA: Diagnosis not present

## 2020-07-18 DIAGNOSIS — K7689 Other specified diseases of liver: Secondary | ICD-10-CM | POA: Diagnosis not present

## 2020-07-18 DIAGNOSIS — S299XXA Unspecified injury of thorax, initial encounter: Secondary | ICD-10-CM | POA: Diagnosis not present

## 2020-07-18 DIAGNOSIS — S3991XA Unspecified injury of abdomen, initial encounter: Secondary | ICD-10-CM | POA: Diagnosis not present

## 2020-07-18 LAB — URINALYSIS, ROUTINE W REFLEX MICROSCOPIC
Bacteria, UA: NONE SEEN
Bilirubin Urine: NEGATIVE
Glucose, UA: NEGATIVE mg/dL
Hgb urine dipstick: NEGATIVE
Ketones, ur: NEGATIVE mg/dL
Leukocytes,Ua: NEGATIVE
Nitrite: NEGATIVE
Protein, ur: NEGATIVE mg/dL
Specific Gravity, Urine: >1.046 — ABNORMAL HIGH (ref 1.005–1.030)
pH: 6 (ref 5.0–8.0)

## 2020-07-18 LAB — CBC WITH DIFFERENTIAL/PLATELET
Abs Immature Granulocytes: 0.04 10*3/uL (ref 0.00–0.07)
Basophils Absolute: 0 10*3/uL (ref 0.0–0.1)
Basophils Relative: 0 %
Eosinophils Absolute: 0.2 10*3/uL (ref 0.0–0.5)
Eosinophils Relative: 2 %
HCT: 33.2 % — ABNORMAL LOW (ref 36.0–46.0)
Hemoglobin: 10.6 g/dL — ABNORMAL LOW (ref 12.0–15.0)
Immature Granulocytes: 0 %
Lymphocytes Relative: 41 %
Lymphs Abs: 4 10*3/uL (ref 0.7–4.0)
MCH: 26.9 pg (ref 26.0–34.0)
MCHC: 31.9 g/dL (ref 30.0–36.0)
MCV: 84.3 fL (ref 80.0–100.0)
Monocytes Absolute: 0.6 10*3/uL (ref 0.1–1.0)
Monocytes Relative: 6 %
Neutro Abs: 4.8 10*3/uL (ref 1.7–7.7)
Neutrophils Relative %: 51 %
Platelets: 220 10*3/uL (ref 150–400)
RBC: 3.94 MIL/uL (ref 3.87–5.11)
RDW: 14.2 % (ref 11.5–15.5)
WBC: 9.7 10*3/uL (ref 4.0–10.5)
nRBC: 0 % (ref 0.0–0.2)

## 2020-07-18 MED ORDER — CYCLOBENZAPRINE HCL 10 MG PO TABS: 10.0000 mg | ORAL_TABLET | Freq: Three times a day (TID) | ORAL | 0 refills | Status: DC | PRN

## 2020-07-18 MED ORDER — IOHEXOL 300 MG/ML  SOLN
100.0000 mL | Freq: Once | INTRAMUSCULAR | Status: AC | PRN
  Administered 2020-07-18: 100 mL via INTRAVENOUS

## 2020-07-18 NOTE — ED Notes (Signed)
Patient transported to CT 
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
## 2020-07-18 NOTE — ED Notes (Signed)
Patient verbalized understanding of discharge instructions. Opportunity for questions and answers.  

## 2020-08-05 NOTE — Progress Notes (Signed)
No ICM remote transmission received for 07/25/2020 and next ICM transmission scheduled for 09/12/2020.   

## 2020-08-26 ENCOUNTER — Ambulatory Visit (INDEPENDENT_AMBULATORY_CARE_PROVIDER_SITE_OTHER): Payer: Medicaid Other

## 2020-08-26 DIAGNOSIS — I429 Cardiomyopathy, unspecified: Secondary | ICD-10-CM | POA: Diagnosis not present

## 2020-08-26 LAB — CUP PACEART REMOTE DEVICE CHECK
Battery Remaining Longevity: 92 mo
Battery Remaining Percentage: 83 %
Battery Voltage: 2.99 V
Brady Statistic RV Percent Paced: 0 %
Date Time Interrogation Session: 20220520020148
HighPow Impedance: 63 Ohm
Implantable Lead Implant Date: 20210107
Implantable Lead Location: 753860
Implantable Pulse Generator Implant Date: 20201112
Lead Channel Impedance Value: 310 Ohm
Lead Channel Pacing Threshold Amplitude: 1 V
Lead Channel Pacing Threshold Pulse Width: 0.5 ms
Lead Channel Sensing Intrinsic Amplitude: 10.4 mV
Lead Channel Setting Pacing Amplitude: 3.5 V
Lead Channel Setting Pacing Pulse Width: 0.5 ms
Lead Channel Setting Sensing Sensitivity: 0.5 mV
Pulse Gen Serial Number: 111012702

## 2020-09-06 ENCOUNTER — Encounter (HOSPITAL_COMMUNITY): Payer: Medicaid Other

## 2020-09-12 ENCOUNTER — Ambulatory Visit (INDEPENDENT_AMBULATORY_CARE_PROVIDER_SITE_OTHER): Payer: Medicaid Other

## 2020-09-12 DIAGNOSIS — Z95810 Presence of automatic (implantable) cardiac defibrillator: Secondary | ICD-10-CM

## 2020-09-12 DIAGNOSIS — I5022 Chronic systolic (congestive) heart failure: Secondary | ICD-10-CM

## 2020-09-12 NOTE — Progress Notes (Signed)
EPIC Encounter for ICM Monitoring  Patient Name: Tara Bradshaw is a 35 y.o. female Date: 09/12/2020 Primary Care Physican: Patient, No Pcp Per (Inactive) Primary Cardiologist:Bensimhon Electrophysiologist:Allred 2/10/2022OfficeWeight:254lbs     Attempted call to patient and unable to reach.   Transmission reviewed.   Corvue thoracic impedancesuggesting possible fluid accumulation starting 09/05/2020.  Prescribed:   Furosemide40 mg take 1 tablet by mouth daily.   Potassium 20 mEq take 1 tablet daily.  Labs: 07/17/2020 Creatinine 0.67, BUN 10, Potassium 4.3, Sodium 138, GFR <60 05/19/2020 Creatinine 0.61, BUN 7,   Potassium 4.0, Sodium 139 A complete set of results can be found in Results Review.  Recommendations:  Unable to reach.    Follow-up plan: ICM clinic phone appointment on6/14/2022 to recheck fluid levels. 91 day device clinic remote transmission8/19/2022.  EP/Cardiology Office Visits: Recall 04/27/2020 with Dr Rayann Heman.  Copy of ICM check sent to Dr.Allred  3 month ICM trend: 09/12/2020.    1 Year ICM trend:       [name], RN 09/12/2020 10:15 AM

## 2020-09-12 NOTE — Progress Notes (Signed)
Remote ICD transmission.   

## 2020-09-13 ENCOUNTER — Telehealth: Payer: Self-pay

## 2020-09-13 NOTE — Telephone Encounter (Signed)
Remote ICM transmission received.  Attempted call to patient regarding ICM remote transmission and left message to return call   

## 2020-09-20 ENCOUNTER — Ambulatory Visit (INDEPENDENT_AMBULATORY_CARE_PROVIDER_SITE_OTHER): Payer: Medicaid Other

## 2020-09-20 DIAGNOSIS — Z95810 Presence of automatic (implantable) cardiac defibrillator: Secondary | ICD-10-CM

## 2020-09-20 DIAGNOSIS — I5022 Chronic systolic (congestive) heart failure: Secondary | ICD-10-CM

## 2020-09-23 NOTE — Progress Notes (Signed)
EPIC Encounter for ICM Monitoring  Patient Name: Tara Bradshaw is a 35 y.o. female Date: 09/23/2020 Primary Care Physican: Patient, No Pcp Per (Inactive) Primary Cardiologist: [place] Electrophysiologist: Allred 05/19/2020 Office Weight: 254 lbs               Transmission reviewed.    Corvue thoracic impedance suggesting fluid levels returned to normal.   Prescribed: Furosemide 40 mg take 1 tablet by mouth daily. Potassium 20 mEq take 1 tablet daily.    Labs: 07/17/2020 Creatinine 0.67, BUN 10, Potassium 4.3, Sodium 138, GFR <60 05/19/2020 Creatinine 0.61, BUN 7,   Potassium 4.0, Sodium 139 A complete set of results can be found in Results Review.   Recommendations:  No changes.     Follow-up plan: ICM clinic phone appointment on 10/31/2020.   91 day device clinic remote transmission 11/25/2020.       EP/Cardiology Office Visits:   Recall 04/27/2020 with Dr Rayann Heman.    Copy of ICM check sent to Dr. Rayann Heman.   3 month ICM trend: 09/20/2020.    1 Year ICM trend:       [name], RN 09/23/2020 8:30 AM

## 2020-10-19 ENCOUNTER — Telehealth: Payer: Self-pay | Admitting: *Deleted

## 2020-10-19 NOTE — Telephone Encounter (Signed)
Left message to call office.   Over due for office visit follow up with device check.

## 2020-10-28 ENCOUNTER — Encounter: Payer: Self-pay | Admitting: Internal Medicine

## 2020-10-28 NOTE — Telephone Encounter (Signed)
Pt has been contacted several times about scheduling an appt with JA or EP APP. Will send pt a letter asking for a call to schedule an appt.

## 2020-10-31 ENCOUNTER — Ambulatory Visit (INDEPENDENT_AMBULATORY_CARE_PROVIDER_SITE_OTHER): Payer: Medicaid Other

## 2020-10-31 DIAGNOSIS — Z95810 Presence of automatic (implantable) cardiac defibrillator: Secondary | ICD-10-CM

## 2020-10-31 DIAGNOSIS — I5022 Chronic systolic (congestive) heart failure: Secondary | ICD-10-CM

## 2020-11-01 ENCOUNTER — Telehealth: Payer: Self-pay

## 2020-11-01 NOTE — Progress Notes (Signed)
EPIC Encounter for ICM Monitoring  Patient Name: Rori Meinders is a 35 y.o. female Date: 11/01/2020 Primary Care Physican: Patient, No Pcp Per (Inactive) Primary Cardiologist: [place] Electrophysiologist: Allred 05/19/2020 Office Weight: 254 lbs                    Attempted call to patient and unable to reach.  Transmission reviewed.   Corvue thoracic impedance suggesting normal fluid levels.   Prescribed: Furosemide 40 mg take 1 tablet by mouth daily. Potassium 20 mEq take 1 tablet daily.    Labs: 07/17/2020 Creatinine 0.67, BUN 10, Potassium 4.3, Sodium 138, GFR <60 05/19/2020 Creatinine 0.61, BUN 7,   Potassium 4.0, Sodium 139 A complete set of results can be found in Results Review.   Recommendations:  Unable to reach.     Follow-up plan: ICM clinic phone appointment on 12/05/2020.   91 day device clinic remote transmission 11/25/2020.       EP/Cardiology Office Visits:   Recall 04/27/2020 with Dr Rayann Heman.    Copy of ICM check sent to Dr. Rayann Heman.   3 month ICM trend: 10/31/2020.    1 Year ICM trend:       [name], RN 11/01/2020 4:56 PM

## 2020-11-01 NOTE — Telephone Encounter (Signed)
Remote ICM transmission received.  Attempted call to patient regarding ICM remote transmission and no answer or voice mail option.  

## 2020-11-25 ENCOUNTER — Ambulatory Visit (INDEPENDENT_AMBULATORY_CARE_PROVIDER_SITE_OTHER): Payer: Medicaid Other

## 2020-11-25 DIAGNOSIS — I429 Cardiomyopathy, unspecified: Secondary | ICD-10-CM

## 2020-11-25 DIAGNOSIS — I5022 Chronic systolic (congestive) heart failure: Secondary | ICD-10-CM

## 2020-11-25 LAB — CUP PACEART REMOTE DEVICE CHECK
Battery Remaining Longevity: 90 mo
Battery Remaining Percentage: 82 %
Battery Voltage: 2.99 V
Brady Statistic RV Percent Paced: 0 %
Date Time Interrogation Session: 20220819020148
HighPow Impedance: 59 Ohm
Implantable Lead Implant Date: 20210107
Implantable Lead Location: 753860
Implantable Pulse Generator Implant Date: 20201112
Lead Channel Impedance Value: 300 Ohm
Lead Channel Pacing Threshold Amplitude: 1 V
Lead Channel Pacing Threshold Pulse Width: 0.5 ms
Lead Channel Sensing Intrinsic Amplitude: 10.9 mV
Lead Channel Setting Pacing Amplitude: 3.5 V
Lead Channel Setting Pacing Pulse Width: 0.5 ms
Lead Channel Setting Sensing Sensitivity: 0.5 mV
Pulse Gen Serial Number: 111012702

## 2020-12-04 IMAGING — CT CT CHEST WITHOUT CONTRAST
2 of 4 series · 15 of 36 positions shown, 18 images · non-contrast
Comparison: Chest radiograph 07/03/2018

CLINICAL DATA: Worsening cough over 2 weeks

EXAM:
CT CHEST WITHOUT CONTRAST
TECHNIQUE: Multidetector CT imaging of the chest was performed following the
standard protocol without IV contrast. Sagittal and coronal MPR
images reconstructed from axial data set.

[Series 4: thorax 2.0 · axial · 0.73mm/px · z∈[+1075,+1341]mm · 12 of 157 slices shown, 15 images]
[im 12/157  mediastinal]
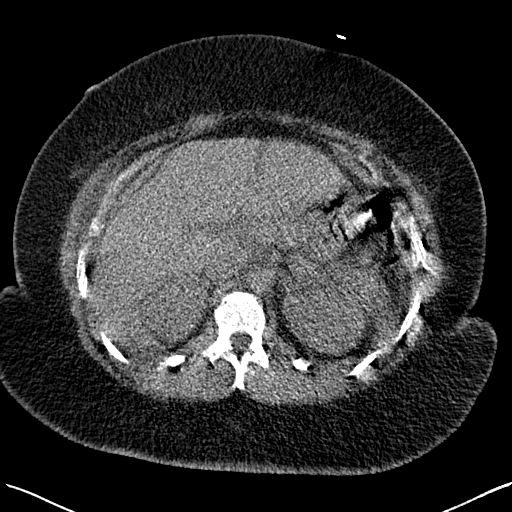
[im 12/157  lung]
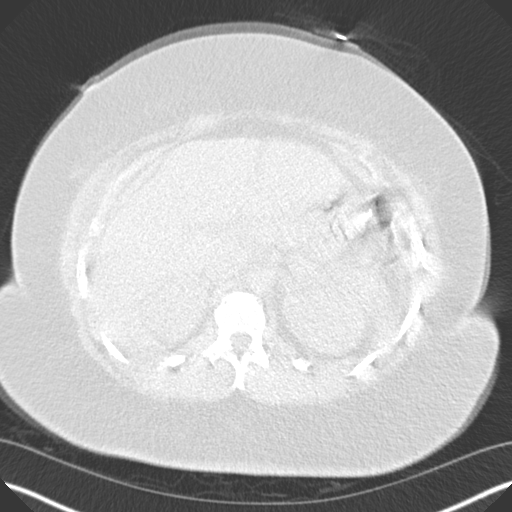
[im 23/157  lung]
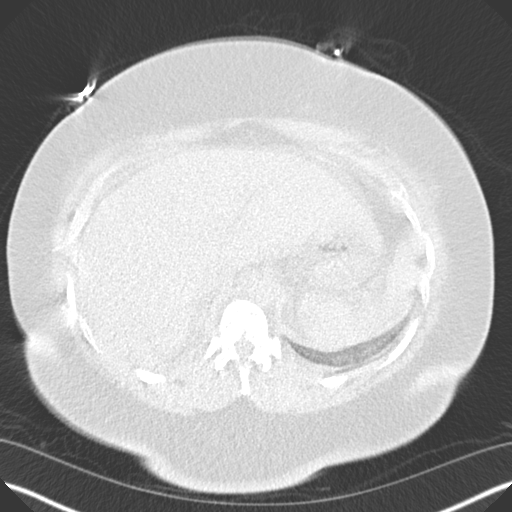
[im 34/157  lung]
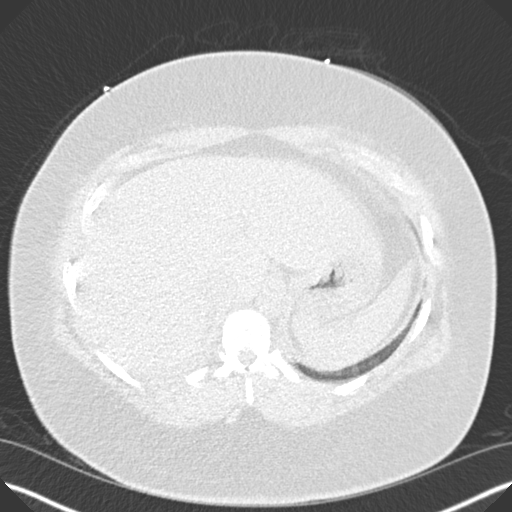
[im 45/157  lung]
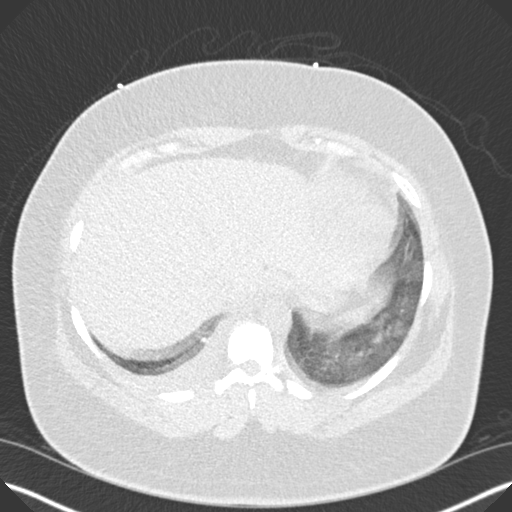
[im 56/157  mediastinal]
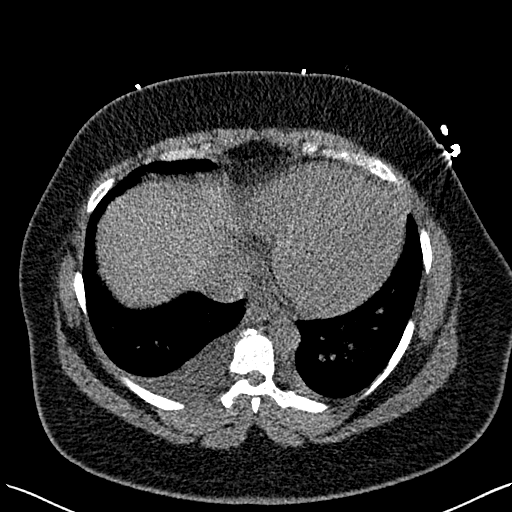
[im 56/157  lung]
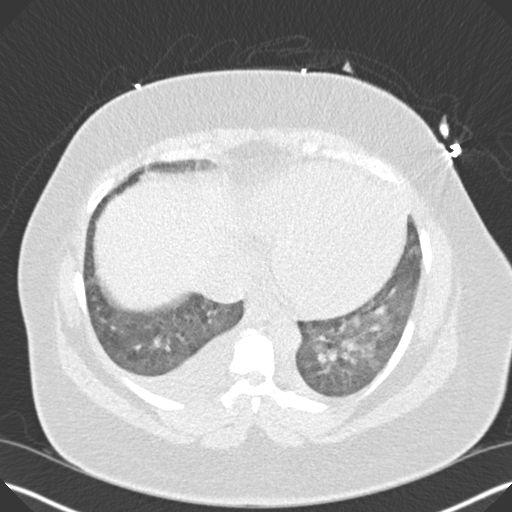
[im 67/157  lung]
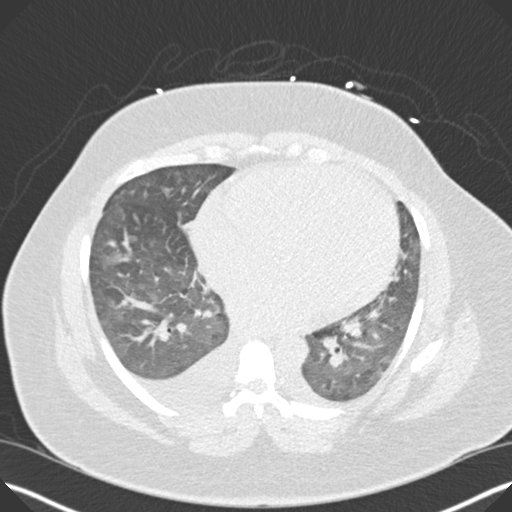
[im 90/157  lung]
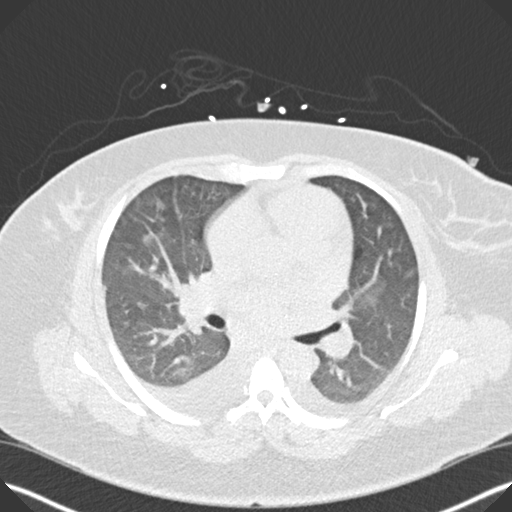
[im 101/157  lung]
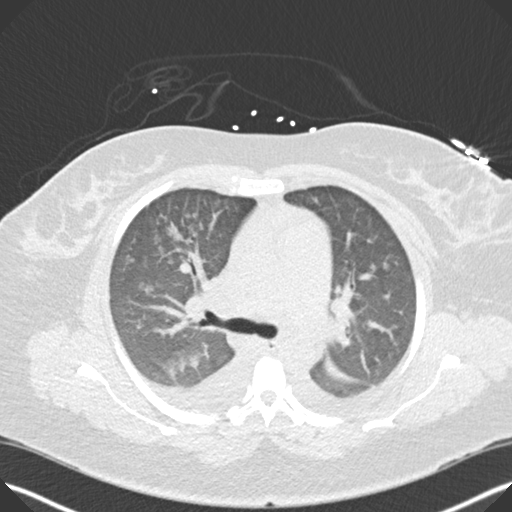
[im 112/157  mediastinal]
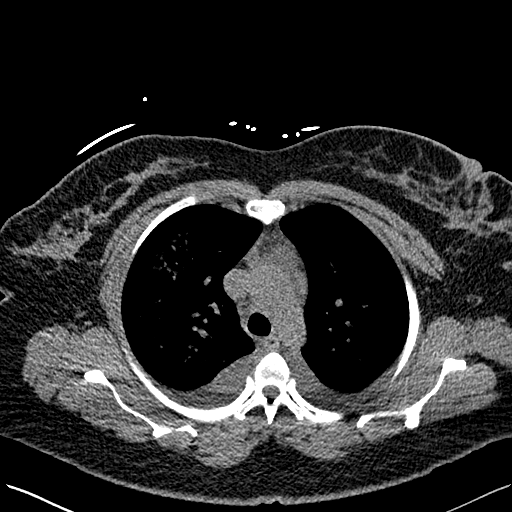
[im 112/157  lung]
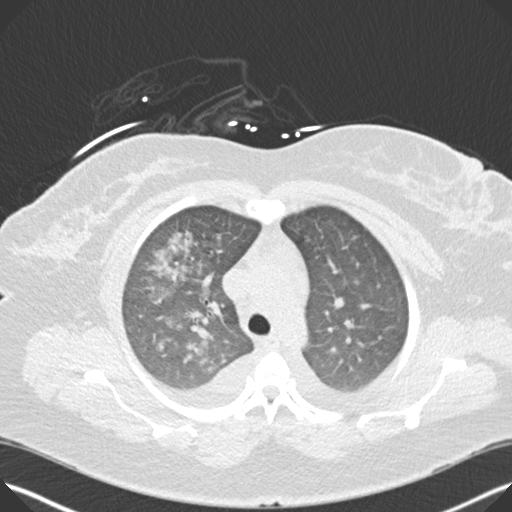
[im 123/157  lung]
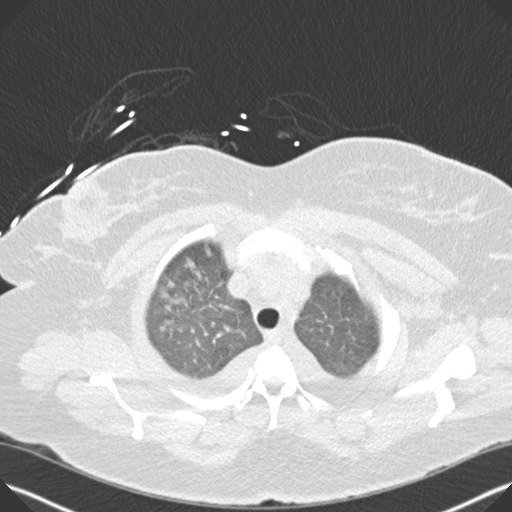
[im 134/157  lung]
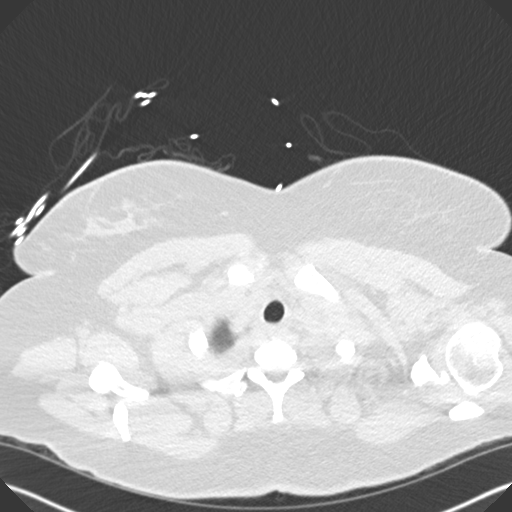
[im 145/157  lung]
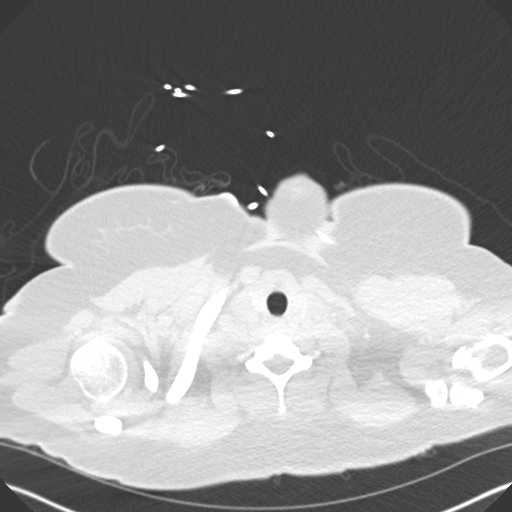

[Series 6: coronal · coronal · 0.61mm/px · 3 of 96 slices shown]
[im 20/96  lung]
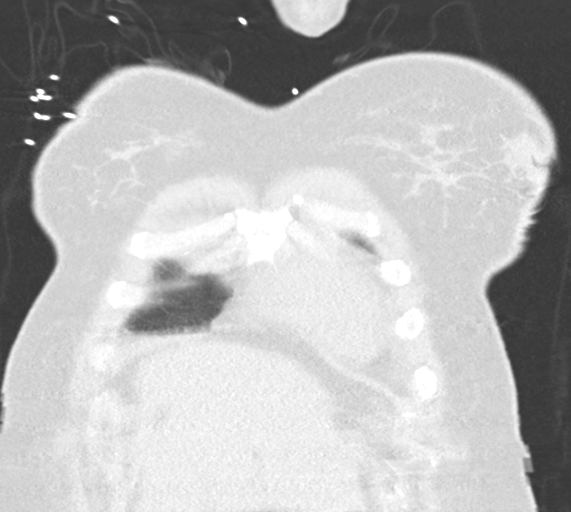
[im 39/96  lung]
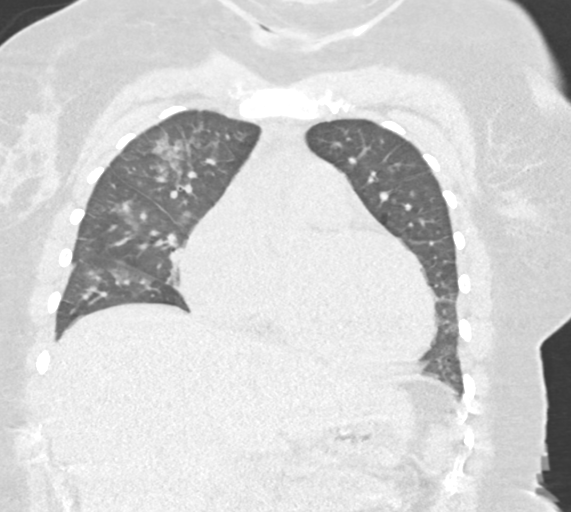
[im 58/96  lung]
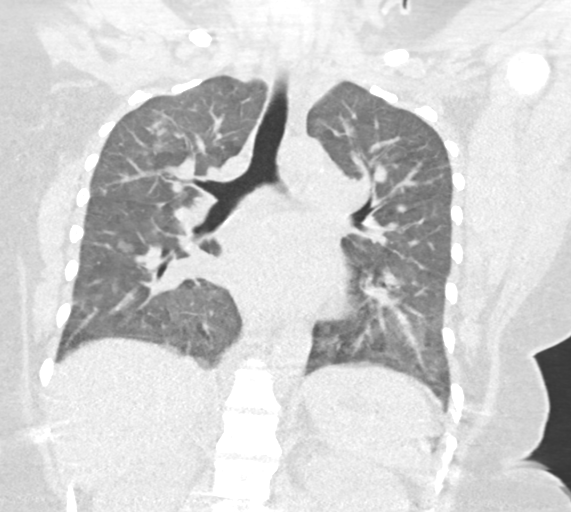

[15 of 36 positions shown; findings below may reference images not displayed]

FINDINGS: Cardiovascular: Aorta normal caliber. Cardiac chambers appear
enlarged. No significant pericardial effusion.

Mediastinum/Nodes: Minimal residual thymic tissue in anterior
mediastinum. Base of cervical region normal appearance. Esophagus
unremarkable.

Lungs/Pleura: Small BILATERAL pleural effusions slightly larger on
RIGHT. Scattered central peribronchovascular infiltrates in RIGHT
upper and RIGHT lower lobes, little in RIGHT middle lobe. Additional
minimal infiltrate in LEFT upper and LEFT lower lobes. Calcified
granuloma anterior RIGHT upper lobe image 66. Minimal peribronchial
thickening. No pneumothorax.

Upper Abdomen: Unremarkable

Musculoskeletal: Unremarkable.
IMPRESSION: Enlargement of cardiac chambers.

Patchy BILATERAL central/peribronchovascular infiltrates
asymmetrically greater in RIGHT lung, nonspecific, could represent
edema or infection.

Associated small BILATERAL pleural effusions slightly larger on
RIGHT.

## 2020-12-05 ENCOUNTER — Ambulatory Visit (INDEPENDENT_AMBULATORY_CARE_PROVIDER_SITE_OTHER): Payer: Medicaid Other

## 2020-12-05 DIAGNOSIS — Z95810 Presence of automatic (implantable) cardiac defibrillator: Secondary | ICD-10-CM

## 2020-12-05 DIAGNOSIS — I5022 Chronic systolic (congestive) heart failure: Secondary | ICD-10-CM

## 2020-12-06 NOTE — Progress Notes (Signed)
EPIC Encounter for ICM Monitoring  Patient Name: Tara Bradshaw is a 36 y.o. female Date: 12/06/2020 Primary Care Physican: Patient, No Pcp Per (Inactive) Primary Cardiologist: [place] Electrophysiologist: Allred Last Office Weight: 254 lbs                    Transmission reviewed.    Corvue thoracic impedance suggesting normal fluid levels.   Prescribed: Furosemide 40 mg take 1 tablet by mouth daily. Potassium 20 mEq take 1 tablet daily.    Labs: 07/17/2020 Creatinine 0.67, BUN 10, Potassium 4.3, Sodium 138, GFR <60 05/19/2020 Creatinine 0.61, BUN 7,   Potassium 4.0, Sodium 139 A complete set of results can be found in Results Review.   Recommendations:  No changes   Follow-up plan: ICM clinic phone appointment on 01/16/2021.   91 day device clinic remote transmission 02/24/2021.       EP/Cardiology Office Visits:   Recall 04/27/2020 with Dr Rayann Heman.    Copy of ICM check sent to Dr. Rayann Heman.    3 month ICM trend: 12/05/2020.    1 Year ICM trend:       [name], RN 12/06/2020 8:58 AM

## 2020-12-10 NOTE — Progress Notes (Signed)
Remote ICD transmission.   

## 2021-01-16 ENCOUNTER — Ambulatory Visit (INDEPENDENT_AMBULATORY_CARE_PROVIDER_SITE_OTHER): Payer: Medicare Other

## 2021-01-16 DIAGNOSIS — I5022 Chronic systolic (congestive) heart failure: Secondary | ICD-10-CM

## 2021-01-16 DIAGNOSIS — Z95810 Presence of automatic (implantable) cardiac defibrillator: Secondary | ICD-10-CM | POA: Diagnosis not present

## 2021-01-18 ENCOUNTER — Telehealth: Payer: Self-pay

## 2021-01-18 NOTE — Progress Notes (Signed)
EPIC Encounter for ICM Monitoring  Patient Name: Tara Bradshaw is a 35 y.o. female Date: 01/18/2021 Primary Care Physican: Patient, No Pcp Per (Inactive) Primary Cardiologist: [place] Electrophysiologist: Allred Last Office Weight: 254 lbs                    Attempted call to patient and unable to reach.  Left detailed message per DPR regarding transmission. Transmission reviewed.    Corvue thoracic impedance suggesting normal fluid levels.   Prescribed: Furosemide 40 mg take 1 tablet by mouth daily. Potassium 20 mEq take 1 tablet daily.    Labs: 07/17/2020 Creatinine 0.67, BUN 10, Potassium 4.3, Sodium 138, GFR <60 05/19/2020 Creatinine 0.61, BUN 7,   Potassium 4.0, Sodium 139 A complete set of results can be found in Results Review.   Recommendations: Unable to reach.     Follow-up plan: ICM clinic phone appointment on 02/20/2021.   91 day device clinic remote transmission 02/24/2021.       EP/Cardiology Office Visits:   Recall 04/27/2020 with Dr Rayann Heman.    Copy of ICM check sent to Dr. Rayann Heman.    3 month ICM trend: 01/16/2021.    1 Year ICM trend:       [name], RN 01/18/2021 1:51 PM

## 2021-01-18 NOTE — Telephone Encounter (Signed)
Remote ICM transmission received.  Attempted call to patient regarding ICM remote transmission and left message per DPR to return call.   

## 2021-02-04 ENCOUNTER — Other Ambulatory Visit (HOSPITAL_COMMUNITY): Payer: Self-pay

## 2021-02-06 ENCOUNTER — Other Ambulatory Visit: Payer: Self-pay

## 2021-02-06 MED ORDER — POTASSIUM CHLORIDE CRYS ER 20 MEQ PO TBCR
20.0000 meq | EXTENDED_RELEASE_TABLET | Freq: Every day | ORAL | 3 refills | Status: DC
  Filled 2021-02-06 – 2021-05-05 (×3): qty 90, 90d supply, fill #0

## 2021-02-08 ENCOUNTER — Other Ambulatory Visit: Payer: Self-pay

## 2021-02-20 ENCOUNTER — Ambulatory Visit (INDEPENDENT_AMBULATORY_CARE_PROVIDER_SITE_OTHER): Payer: Medicare Other

## 2021-02-20 DIAGNOSIS — Z95810 Presence of automatic (implantable) cardiac defibrillator: Secondary | ICD-10-CM

## 2021-02-20 DIAGNOSIS — I5022 Chronic systolic (congestive) heart failure: Secondary | ICD-10-CM

## 2021-02-21 NOTE — Progress Notes (Signed)
EPIC Encounter for ICM Monitoring  Patient Name: Tara Bradshaw is a 35 y.o. female Date: 02/21/2021 Primary Care Physican: Patient, No Pcp Per (Inactive) Primary Cardiologist: [place] Electrophysiologist: Allred 02/21/2021 Weight: 254 lbs                    Spoke with patient and heart failure questions reviewed.  Pt asymptomatic for fluid accumulation and feeling well.   Corvue thoracic impedance suggesting normal fluid levels.   Prescribed: Furosemide 40 mg take 1 tablet by mouth daily. Potassium 20 mEq take 1 tablet daily.    Labs: 07/17/2020 Creatinine 0.67, BUN 10, Potassium 4.3, Sodium 138, GFR <60 05/19/2020 Creatinine 0.61, BUN 7,   Potassium 4.0, Sodium 139 A complete set of results can be found in Results Review.   Recommendations: No changes and encouraged to call if experiencing any fluid symptoms.   Follow-up plan: ICM clinic phone appointment on 03/27/2021.   91 day device clinic remote transmission 02/24/2021.       EP/Cardiology Office Visits:   Recall 04/27/2020 with Dr Rayann Heman.    Copy of ICM check sent to Dr. Rayann Heman.     3 month ICM trend: 02/20/2021.    12-14 Month ICM trend:       [name], RN 02/21/2021 3:57 PM

## 2021-02-24 ENCOUNTER — Ambulatory Visit (INDEPENDENT_AMBULATORY_CARE_PROVIDER_SITE_OTHER): Payer: Medicare Other

## 2021-02-24 DIAGNOSIS — I429 Cardiomyopathy, unspecified: Secondary | ICD-10-CM

## 2021-02-24 LAB — CUP PACEART REMOTE DEVICE CHECK
Battery Remaining Longevity: 88 mo
Battery Remaining Percentage: 80 %
Battery Voltage: 2.98 V
Brady Statistic RV Percent Paced: 1 %
Date Time Interrogation Session: 20221118020011
HighPow Impedance: 63 Ohm
Implantable Lead Implant Date: 20210107
Implantable Lead Location: 753860
Implantable Pulse Generator Implant Date: 20201112
Lead Channel Impedance Value: 310 Ohm
Lead Channel Pacing Threshold Amplitude: 1 V
Lead Channel Pacing Threshold Pulse Width: 0.5 ms
Lead Channel Sensing Intrinsic Amplitude: 11.9 mV
Lead Channel Setting Pacing Amplitude: 3.5 V
Lead Channel Setting Pacing Pulse Width: 0.5 ms
Lead Channel Setting Sensing Sensitivity: 0.5 mV
Pulse Gen Serial Number: 111012702

## 2021-03-06 NOTE — Progress Notes (Signed)
Remote ICD transmission.   

## 2021-03-30 NOTE — Progress Notes (Signed)
No ICM remote transmission received for 03/27/2021 and next ICM transmission scheduled for 05/09/2021.

## 2021-05-05 ENCOUNTER — Ambulatory Visit (INDEPENDENT_AMBULATORY_CARE_PROVIDER_SITE_OTHER): Payer: 59 | Admitting: Physician Assistant

## 2021-05-05 ENCOUNTER — Other Ambulatory Visit (HOSPITAL_COMMUNITY): Payer: Self-pay

## 2021-05-05 ENCOUNTER — Other Ambulatory Visit: Payer: Self-pay

## 2021-05-05 ENCOUNTER — Other Ambulatory Visit (HOSPITAL_COMMUNITY): Payer: Self-pay | Admitting: Internal Medicine

## 2021-05-05 ENCOUNTER — Encounter: Payer: Self-pay | Admitting: Physician Assistant

## 2021-05-05 VITALS — BP 104/72 | HR 83 | Ht 61.0 in | Wt 268.4 lb

## 2021-05-05 DIAGNOSIS — Z79899 Other long term (current) drug therapy: Secondary | ICD-10-CM | POA: Diagnosis not present

## 2021-05-05 DIAGNOSIS — I428 Other cardiomyopathies: Secondary | ICD-10-CM

## 2021-05-05 DIAGNOSIS — I5022 Chronic systolic (congestive) heart failure: Secondary | ICD-10-CM

## 2021-05-05 DIAGNOSIS — I471 Supraventricular tachycardia: Secondary | ICD-10-CM

## 2021-05-05 DIAGNOSIS — I493 Ventricular premature depolarization: Secondary | ICD-10-CM

## 2021-05-05 DIAGNOSIS — Z95810 Presence of automatic (implantable) cardiac defibrillator: Secondary | ICD-10-CM | POA: Diagnosis not present

## 2021-05-05 LAB — COMPREHENSIVE METABOLIC PANEL
ALT: 14 IU/L (ref 0–32)
AST: 12 IU/L (ref 0–40)
Albumin/Globulin Ratio: 1.9 (ref 1.2–2.2)
Albumin: 4.8 g/dL (ref 3.8–4.8)
Alkaline Phosphatase: 72 IU/L (ref 44–121)
BUN/Creatinine Ratio: 13 (ref 9–23)
BUN: 9 mg/dL (ref 6–20)
Bilirubin Total: 0.4 mg/dL (ref 0.0–1.2)
CO2: 25 mmol/L (ref 20–29)
Calcium: 9.7 mg/dL (ref 8.7–10.2)
Chloride: 103 mmol/L (ref 96–106)
Creatinine, Ser: 0.69 mg/dL (ref 0.57–1.00)
Globulin, Total: 2.5 g/dL (ref 1.5–4.5)
Glucose: 90 mg/dL (ref 70–99)
Potassium: 4.4 mmol/L (ref 3.5–5.2)
Sodium: 138 mmol/L (ref 134–144)
Total Protein: 7.3 g/dL (ref 6.0–8.5)
eGFR: 115 mL/min/{1.73_m2} (ref >59)

## 2021-05-05 LAB — CUP PACEART INCLINIC DEVICE CHECK
Battery Remaining Longevity: 98 mo
Brady Statistic RV Percent Paced: 0 %
Date Time Interrogation Session: 20230127185259
HighPow Impedance: 68.625
Implantable Lead Implant Date: 20210107
Implantable Lead Location: 753860
Implantable Pulse Generator Implant Date: 20201112
Lead Channel Impedance Value: 337.5 Ohm
Lead Channel Pacing Threshold Amplitude: 1.25 V
Lead Channel Pacing Threshold Amplitude: 1.25 V
Lead Channel Pacing Threshold Pulse Width: 0.5 ms
Lead Channel Pacing Threshold Pulse Width: 0.5 ms
Lead Channel Sensing Intrinsic Amplitude: 11.9 mV
Lead Channel Setting Pacing Amplitude: 2.5 V
Lead Channel Setting Pacing Pulse Width: 0.5 ms
Lead Channel Setting Sensing Sensitivity: 0.5 mV
Pulse Gen Serial Number: 111012702

## 2021-05-05 LAB — TSH: TSH: 0.492 u[IU]/mL (ref 0.450–4.500)
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
## 2021-05-05 NOTE — Patient Instructions (Addendum)
Medication Instructions:   Your physician recommends that you continue on your current medications as directed. Please refer to the Current Medication list given to you today.  *If you need a refill on your cardiac medications before your next appointment, please call your pharmacy*   Lab Work:  CMET AND TSH TODAY   Dig level to be drawn at [facility]  If you have labs (blood work) drawn today and your tests are completely normal, you will receive your results only by: [facility] (if you have MyChart) OR A paper copy in the mail If you have any lab test that is abnormal or we need to change your treatment, we will call you to review the results.   Testing/Procedures: NONE ORDERED  TODAY    Follow-Up: At [facility], you and your health needs are our priority.  As part of our continuing mission to provide you with exceptional heart care, we have created designated Provider Care Teams.  These Care Teams include your primary Cardiologist (physician) and Advanced Practice Providers (APPs -  Physician Assistants and Nurse Practitioners) who all work together to provide you with the care you need, when you need it.  We recommend signing up for the patient portal called "MyChart".  Sign up information is provided on this After Visit Summary.  MyChart is used to connect with patients for Virtual Visits (Telemedicine).  Patients are able to view lab/test results, encounter notes, upcoming appointments, etc.  Non-urgent messages can be sent to your provider as well.   To learn more about what you can do with MyChart, go to [email or web address].    Your next appointment:   6 month(s)  The format for your next appointment:   In Person  Provider:   You may see Dr. Quentin Ore or one of the following Advanced Practice Providers on your designated Care Team:   Tommye Standard, [place] Legrand Como "Jonni Sanger" Chalmers Cater, [place]   Other Instructions

## 2021-05-08 ENCOUNTER — Other Ambulatory Visit: Payer: Self-pay

## 2021-05-10 ENCOUNTER — Telehealth: Payer: Self-pay

## 2021-05-10 ENCOUNTER — Other Ambulatory Visit (HOSPITAL_COMMUNITY): Payer: Self-pay

## 2021-05-10 MED ORDER — MECLIZINE HCL 25 MG PO TABS
25.0000 mg | ORAL_TABLET | Freq: Three times a day (TID) | ORAL | 0 refills | Status: DC | PRN
  Filled 2021-05-10: qty 15, 5d supply, fill #0

## 2021-05-10 NOTE — Telephone Encounter (Signed)
Attempted ICM call to patient.  Left message advising monitor has been disconnected since November.  Provided [employer] number to call if she is unable to send manual remote transmission for review.

## 2021-05-12 NOTE — Progress Notes (Signed)
No ICM remote transmission received for 05/09/2021 and next ICM transmission scheduled for 06/05/2021.

## 2021-05-15 ENCOUNTER — Other Ambulatory Visit: Payer: Self-pay

## 2021-05-17 ENCOUNTER — Encounter (HOSPITAL_COMMUNITY): Payer: Self-pay

## 2021-05-17 ENCOUNTER — Ambulatory Visit (HOSPITAL_COMMUNITY)
Admission: EM | Admit: 2021-05-17 | Discharge: 2021-05-17 | Disposition: A | Discharge status: Home / Self Care | Payer: 59 | Attending: Family Medicine | Admitting: Family Medicine

## 2021-05-17 ENCOUNTER — Other Ambulatory Visit: Payer: Self-pay

## 2021-05-17 DIAGNOSIS — J019 Acute sinusitis, unspecified: Secondary | ICD-10-CM

## 2021-05-17 DIAGNOSIS — B9689 Other specified bacterial agents as the cause of diseases classified elsewhere: Secondary | ICD-10-CM | POA: Diagnosis not present

## 2021-05-17 MED ORDER — AMOXICILLIN-POT CLAVULANATE 875-125 MG PO TABS: 1.0000 | ORAL_TABLET | Freq: Two times a day (BID) | ORAL | 0 refills | Status: AC

## 2021-05-17 NOTE — ED Provider Notes (Signed)
[facility]    CSN: [id number] Arrival date & time: 05/17/21  1136      History   Chief Complaint Chief Complaint  Patient presents with   Sore Throat    HPI Tara Bradshaw is a 36 y.o. female.   Cough Started over 7 days ago No fevers  Worsening over the last 2 days Endorses congestion, rhinorrhea, irritated throat, Deniesheadache, fatigue, myalgias, nausea, vomiting, diarrhea, chest pain, shortness of breath Has been drinking normally, has normal UOP  Children have had colds Took a home COVID test 3 days ago that was negative    Past Medical History:  Diagnosis Date   Anemia    CHRONIC   CHF (congestive heart failure) (HCC)    Chronic systolic dysfunction of left ventricle    GERD (gastroesophageal reflux disease)    WITH PREGNANCY   Nonischemic cardiomyopathy (HCC)    Pneumonia    x 1   PVC's (premature ventricular contractions)     Patient Active Problem List   Diagnosis Date Noted   Adjustment disorder with mixed anxiety and depressed mood 10/24/2019   Cardiomyopathy ([place]) 04/06/2019   ICD (implantable cardioverter-defibrillator) in place 04/06/2019   Subclinical hyperthyroidism 07/05/2018   Abnormal transaminases 07/05/2018   Acute congestive heart failure ([place]) 07/03/2018   Anemia, postpartum 12/13/2017   Normal postpartum course 12/13/2017   Indication for care in labor or delivery 12/11/2017   Hyperemesis affecting pregnancy, antepartum 07/25/2017   Dehydration during pregnancy 07/23/2017   Obesity, Class III, BMI 40-49.9 (morbid obesity) ([place]) 11/26/2016   GERD (gastroesophageal reflux disease) 11/26/2016    Past Surgical History:  Procedure Laterality Date   DILATION AND EVACUATION N/A 11/30/2016   Procedure: DILATATION AND EVACUATION;  Surgeon: Donnamae Jude, MD;  Location: [facility];  Service: Gynecology;  Laterality: N/A;   ICD IMPLANT N/A 02/19/2019   Procedure: ICD IMPLANT;  Surgeon: Thompson Grayer, MD;  Location: [facility];  Service: Cardiovascular;  Laterality: N/A;   LEAD REVISION/REPAIR N/A 04/16/2019   Procedure: LEAD REVISION/REPAIR;  Surgeon: Thompson Grayer, MD;  Location: [facility];  Service: Cardiovascular;  Laterality: N/A;   RIGHT/LEFT HEART CATH AND CORONARY ANGIOGRAPHY N/A 01/22/2019   Procedure: RIGHT/LEFT HEART CATH AND CORONARY ANGIOGRAPHY;  Surgeon: Jolaine Artist, MD;  Location: [facility];  Service: Cardiovascular;  Laterality: N/A;   WISDOM TOOTH EXTRACTION     NO ANESTHESIA    OB History     Gravida  3   Para  2   Term  2   Preterm      AB  1   Living  2      SAB  1   IAB      Ectopic      Multiple  0   Live Births  2        Obstetric Comments  G2- D&C for missed AB          Home Medications    Prior to Admission medications   Medication Sig Start Date End Date Taking? Authorizing Provider  amoxicillin-clavulanate (AUGMENTIN) 875-125 MG tablet Take 1 tablet by mouth every 12 (twelve) hours for 10 doses. 05/17/21 05/22/21 Yes Loring Liskey, Bernita Raisin, DO  acetaminophen (TYLENOL) 325 MG tablet Take 650 mg by mouth every 6 (six) hours as needed for mild pain or headache.    [provider]  amiodarone (PACERONE) 200 MG tablet Take 0.5 tablets (100 mg total) by mouth daily. 12/22/19  Bensimhon, Shaune Pascal, MD  amLODipine (NORVASC) 10 MG tablet Take 1 tablet (10 mg total) by mouth daily. 05/19/20   Bensimhon, Shaune Pascal, MD  carvedilol (COREG) 6.25 MG tablet Take 1 tablet (6.25 mg total) by mouth 2 (two) times daily. Patient not taking: Reported on 05/05/2021 12/22/19   Bensimhon, Shaune Pascal, MD  cyclobenzaprine (FLEXERIL) 10 MG tablet Take 1 tablet (10 mg total) by mouth 3 (three) times daily as needed for muscle spasms. 07/18/20   Montine Circle, PA-C  digoxin (LANOXIN) 0.125 MG tablet Take 1 tablet (0.125 mg total) by mouth daily. 12/22/19   Bensimhon, Shaune Pascal, MD  ENTRESTO 97-103 MG TAKE 1 TABLET BY MOUTH TWICE A DAY 12/25/19   Bensimhon,  Shaune Pascal, MD  FARXIGA 10 MG TABS tablet TAKE 1 TABLET BY MOUTH DAILY BEFORE BREAKFAST. 05/19/20   Bensimhon, Shaune Pascal, MD  furosemide (LASIX) 40 MG tablet Take 40 mg by mouth 2 (two) times daily.    [provider]  isosorbide-hydrALAZINE (BIDIL) 20-37.5 MG tablet Take 1 tablet by mouth in the morning and at bedtime. Patient not taking: Reported on 05/05/2021 02/17/20   Bensimhon, Shaune Pascal, MD  levonorgestrel (MIRENA) 20 MCG/24HR IUD 1 each by Intrauterine route once.    [provider]  meclizine (ANTIVERT) 25 MG tablet Take 1 tablet (25 mg total) by mouth 3 (three) times daily as needed for dizziness. 05/10/21   Bensimhon, Shaune Pascal, MD  penicillin v potassium (VEETID) 500 MG tablet Take 500 mg by mouth 4 (four) times daily. 02/28/21   [provider]  potassium chloride SA (KLOR-CON M) 20 MEQ tablet Take 1 tablet (20 mEq total) by mouth daily. 02/06/21 05/07/21  Larey Dresser, MD  spironolactone (ALDACTONE) 25 MG tablet TAKE 1 TABLET BY MOUTH EVERY DAY 12/25/19   Bensimhon, Shaune Pascal, MD    Family History Family History  Problem Relation Age of Onset   Epilepsy Mother    Asthma Mother    Cancer Paternal Grandmother        BREAST   Cancer Maternal Aunt 51       BREAST   Hypertension Maternal Grandmother     Social History Social History   Tobacco Use   Smoking status: Never   Smokeless tobacco: Never  Vaping Use   Vaping Use: Never used  Substance Use Topics   Alcohol use: No   Drug use: No     Allergies   Patient has no known allergies.   Review of Systems Review of Systems  All other systems reviewed and are negative.  Per HPI Physical Exam Triage Vital Signs ED Triage Vitals [05/17/21 1223]  Enc Vitals Group     BP 134/85     Pulse      Resp      Temp      Temp src      SpO2 100 %     Weight      Height      Head Circumference      Peak Flow      Pain Score      Pain Loc      Pain Edu?      Excl. in [place]?    No data  found.  Updated Vital Signs BP 134/85 (BP Location: Left Arm)    Pulse 83    Temp 98 F (36.7 C)    SpO2 100%   Visual Acuity Right Eye Distance:   Left Eye Distance:  Bilateral Distance:    Right Eye Near:   Left Eye Near:    Bilateral Near:     Physical Exam Constitutional:      General: She is not in acute distress.    Appearance: She is well-developed. She is not ill-appearing or toxic-appearing.  HENT:     Head: Normocephalic and atraumatic.     Right Ear: Tympanic membrane, ear canal and external ear normal.     Left Ear: Tympanic membrane, ear canal and external ear normal.     Nose: Congestion and rhinorrhea present.     Mouth/Throat:     Mouth: Mucous membranes are moist.     Pharynx: No oropharyngeal exudate or posterior oropharyngeal erythema.     Tonsils: No tonsillar exudate.  Eyes:     Conjunctiva/sclera: Conjunctivae normal.  Cardiovascular:     Rate and Rhythm: Normal rate.  Pulmonary:     Effort: Pulmonary effort is normal. No respiratory distress.     Breath sounds: Normal breath sounds. No wheezing, rhonchi or rales.  Musculoskeletal:     Cervical back: Neck supple. No rigidity or tenderness.  Lymphadenopathy:     Cervical: No cervical adenopathy.  Skin:    General: Skin is warm and dry.     Capillary Refill: Capillary refill takes less than 2 seconds.  Neurological:     Mental Status: She is alert and oriented to person, place, and time.     UC Treatments / Results  Labs (all labs ordered are listed, but only abnormal results are displayed) Labs Reviewed - No data to display  EKG   Radiology No results found.  Procedures Procedures (including critical care time)  Medications Ordered in UC Medications - No data to display  Initial Impression / Assessment and Plan / UC Course  I have reviewed the triage vital signs and the nursing notes.  Pertinent labs & imaging results that were available during my care of the patient were  reviewed by me and considered in my medical decision making (see chart for details).     VSS.  COVID and flu test deferred given length of symptoms and would not change management.  Acute bacterial rhinosinusitis.  Rx send for augmentin.  Advised of OTC treatments and ED precautions, see AVS.     Final Clinical Impressions(s) / UC Diagnoses   Final diagnoses:  Acute bacterial rhinosinusitis     Discharge Instructions      You have a sinus infection.  I have sent an antibiotic to the pharmacy for you. If you have difficulty breathing, chest pain, you are vomiting and can't keep any liquids down and you aren't urinating at least 50% of your normal amount, you should be seen at the emergency room right away.  If you aren't improving over the next week, please follow up with your regular medical provider.  For your congestion, you can use nasal saline spray.  You can also use a humidifier.  You can also use honey as needed for cough by the spoonful or in a warm liquid (do not give honey to an infant less than a year old).      ED Prescriptions     Medication Sig Dispense Auth. Provider   amoxicillin-clavulanate (AUGMENTIN) 875-125 MG tablet Take 1 tablet by mouth every 12 (twelve) hours for 10 doses. 10 tablet Youlanda Tomassetti, Bernita Raisin, DO      PDMP not reviewed this encounter.   Atsushi Yom, Bernita Raisin, DO 05/17/21 1253

## 2021-05-17 NOTE — Discharge Instructions (Signed)
You have a sinus infection.  I have sent an antibiotic to the pharmacy for you. If you have difficulty breathing, chest pain, you are vomiting and can't keep any liquids down and you aren't urinating at least 50% of your normal amount, you should be seen at the emergency room right away.  If you aren't improving over the next week, please follow up with your regular medical provider.  For your congestion, you can use nasal saline spray.  You can also use a humidifier.  You can also use honey as needed for cough by the spoonful or in a warm liquid (do not give honey to an infant less than a year old).

## 2021-05-17 NOTE — ED Triage Notes (Signed)
Pt c/o sore throat and nasal drainage for several days.

## 2021-06-02 ENCOUNTER — Ambulatory Visit (INDEPENDENT_AMBULATORY_CARE_PROVIDER_SITE_OTHER): Payer: 59

## 2021-06-02 DIAGNOSIS — I428 Other cardiomyopathies: Secondary | ICD-10-CM

## 2021-06-02 LAB — CUP PACEART REMOTE DEVICE CHECK
Battery Remaining Longevity: 93 mo
Battery Remaining Percentage: 77 %
Battery Voltage: 2.98 V
Brady Statistic RV Percent Paced: 0 %
Date Time Interrogation Session: 20230223160653
HighPow Impedance: 66 Ohm
Implantable Lead Implant Date: 20210107
Implantable Lead Location: 753860
Implantable Pulse Generator Implant Date: 20201112
Lead Channel Impedance Value: 340 Ohm
Lead Channel Pacing Threshold Amplitude: 1.25 V
Lead Channel Pacing Threshold Pulse Width: 0.5 ms
Lead Channel Sensing Intrinsic Amplitude: 11.9 mV
Lead Channel Setting Pacing Amplitude: 2.5 V
Lead Channel Setting Pacing Pulse Width: 0.5 ms
Lead Channel Setting Sensing Sensitivity: 0.5 mV
Pulse Gen Serial Number: 111012702

## 2021-06-05 ENCOUNTER — Ambulatory Visit (INDEPENDENT_AMBULATORY_CARE_PROVIDER_SITE_OTHER): Payer: Medicaid Other

## 2021-06-05 DIAGNOSIS — Z95810 Presence of automatic (implantable) cardiac defibrillator: Secondary | ICD-10-CM

## 2021-06-05 DIAGNOSIS — I5022 Chronic systolic (congestive) heart failure: Secondary | ICD-10-CM

## 2021-06-05 NOTE — Progress Notes (Signed)
EPIC Encounter for ICM Monitoring  Patient Name: Quaniya Damas is a 36 y.o. female Date: 06/05/2021 Primary Care Physican: Patient, No Pcp Per (Inactive) Primary Cardiologist: [place] Electrophysiologist: [place] 05/05/2021 Office Weight: 268 lbs                    Spoke with patient and heart failure questions reviewed.  Pt has been sick with GI virus and sinus infection but feeling better.  She has occasional sharp pain under breast that last less than 1 minute and will discuss with Dr Haroldine Laws at 3/3 OV.    [place] thoracic impedance suggesting normal fluid levels.   Prescribed: Furosemide 40 mg take 1 tablet by mouth daily. Potassium 20 mEq take 1 tablet daily.    Labs: 05/05/2021 Creatinine 0.69, BUN 9,   Potassium 4.4, Sodium 138, GFR 115 07/17/2020 Creatinine 0.67, BUN 10, Potassium 4.3, Sodium 138, GFR <60 05/19/2020 Creatinine 0.61, BUN 7,   Potassium 4.0, Sodium 139 A complete set of results can be found in Results Review.   Recommendations:  No changes and encouraged to call if experiencing any fluid symptoms.   Follow-up plan: ICM clinic phone appointment on 07/10/2021.   91 day device clinic remote transmission 09/01/2021.       EP/Cardiology Office Visits: 06/09/2021 with Dr Haroldine Laws.    Recall 04/27/2020 with Dr Rayann Heman.    Copy of ICM check sent to Dr. Rayann Heman.     3 month ICM trend: 06/01/2021.    12-14 Month ICM trend:     [name], RN 06/05/2021 1:15 PM

## 2021-06-06 NOTE — Progress Notes (Signed)
Remote ICD transmission.   

## 2021-06-09 ENCOUNTER — Other Ambulatory Visit: Payer: Self-pay

## 2021-06-09 ENCOUNTER — Telehealth: Payer: Self-pay | Admitting: Pharmacist

## 2021-06-09 ENCOUNTER — Encounter (HOSPITAL_COMMUNITY): Payer: Self-pay | Admitting: Internal Medicine

## 2021-06-09 ENCOUNTER — Ambulatory Visit (HOSPITAL_COMMUNITY)
Admission: RE | Admit: 2021-06-09 | Discharge: 2021-06-09 | Disposition: A | Discharge status: Home / Self Care | Payer: 59 | Source: Ambulatory Visit | Attending: Internal Medicine | Admitting: Internal Medicine

## 2021-06-09 VITALS — BP 118/78 | HR 76 | Wt 275.6 lb

## 2021-06-09 DIAGNOSIS — Z79899 Other long term (current) drug therapy: Secondary | ICD-10-CM | POA: Insufficient documentation

## 2021-06-09 DIAGNOSIS — I472 Ventricular tachycardia, unspecified: Secondary | ICD-10-CM | POA: Insufficient documentation

## 2021-06-09 DIAGNOSIS — Z95810 Presence of automatic (implantable) cardiac defibrillator: Secondary | ICD-10-CM | POA: Diagnosis not present

## 2021-06-09 DIAGNOSIS — I110 Hypertensive heart disease with heart failure: Secondary | ICD-10-CM | POA: Diagnosis not present

## 2021-06-09 DIAGNOSIS — R002 Palpitations: Secondary | ICD-10-CM | POA: Insufficient documentation

## 2021-06-09 DIAGNOSIS — I429 Cardiomyopathy, unspecified: Secondary | ICD-10-CM

## 2021-06-09 DIAGNOSIS — Z7984 Long term (current) use of oral hypoglycemic drugs: Secondary | ICD-10-CM | POA: Insufficient documentation

## 2021-06-09 DIAGNOSIS — I493 Ventricular premature depolarization: Secondary | ICD-10-CM | POA: Insufficient documentation

## 2021-06-09 DIAGNOSIS — Z634 Disappearance and death of family member: Secondary | ICD-10-CM | POA: Diagnosis not present

## 2021-06-09 DIAGNOSIS — I428 Other cardiomyopathies: Secondary | ICD-10-CM | POA: Insufficient documentation

## 2021-06-09 DIAGNOSIS — G4733 Obstructive sleep apnea (adult) (pediatric): Secondary | ICD-10-CM | POA: Insufficient documentation

## 2021-06-09 DIAGNOSIS — F439 Reaction to severe stress, unspecified: Secondary | ICD-10-CM | POA: Diagnosis not present

## 2021-06-09 DIAGNOSIS — I5022 Chronic systolic (congestive) heart failure: Secondary | ICD-10-CM | POA: Diagnosis not present

## 2021-06-09 DIAGNOSIS — Z9989 Dependence on other enabling machines and devices: Secondary | ICD-10-CM | POA: Diagnosis not present

## 2021-06-09 MED ORDER — OZEMPIC (0.25 OR 0.5 MG/DOSE) 2 MG/1.5ML ~~LOC~~ SOPN: PEN_INJECTOR | SUBCUTANEOUS | 1 refills | Status: DC

## 2021-06-09 NOTE — Patient Instructions (Signed)
Good to see you today! ? ?STOP Digoxin ? ?You have been referred for semaglutide,that office will call you to schedule an appointment ? ?Your physician has requested that you have an echocardiogram. Echocardiography is a painless test that uses sound waves to create images of your heart. It provides your doctor with information about the size and shape of your heart and how well your heart?s chambers and valves are working. This procedure takes approximately one hour. There are no restrictions for this procedure. ? ?Your physician recommends that you schedule a follow-up appointment in: 4 months with Dr.Bensimhon   ? ?If you have any questions or concerns before your next appointment please send [place] a message through [place] or call our office at [phone].   ? ?TO LEAVE A MESSAGE FOR THE NURSE SELECT OPTION 2, PLEASE LEAVE A MESSAGE INCLUDING: ?YOUR NAME ?DATE OF BIRTH ?CALL BACK NUMBER ?REASON FOR CALL**this is important as we prioritize the call backs ? ?YOU WILL RECEIVE A CALL BACK THE SAME DAY AS LONG AS YOU CALL BEFORE 4:00 PM ? ?At the [facility], you and your health needs are our priority. As part of our continuing mission to provide you with exceptional heart care, we have created designated Provider Care Teams. These Care Teams include your primary Cardiologist (physician) and Advanced Practice Providers (APPs- Physician Assistants and Nurse Practitioners) who all work together to provide you with the care you need, when you need it.  ? ?You may see any of the following providers on your designated Care Team at your next follow up: ?Dr Glori Bickers ?Dr Loralie Champagne ?Darrick Grinder, NP ?Lyda Jester, PA ?Jessica Milford,NP ?Marlyce Huge, PA ?Audry Riles, PharmD ? ? ?Please be sure to bring in all your medications bottles to every appointment.  ? ?Do the following things EVERYDAY: ?Weigh yourself in the morning before breakfast. Write it down and keep it in a log. ?Take your  medicines as prescribed ?Eat low salt foods--Limit salt (sodium) to 2000 mg per day.  ?Stay as active as you can everyday ?Limit all fluids for the day to less than 2 liters ? ?
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
## 2021-06-09 NOTE — Telephone Encounter (Signed)
Pt referred to PharmD by Dr Haroldine Laws to initiate [id number] therapy for weight loss. Pt has Medicare/Medicaid Dual Complete insurance which will not cover weight loss medications (Wegovy/Saxenda excluded from coverage). Her insurance does have Ozempic on formulary with no PA needed. If her insurance changes in the future or PA is required, she would no longer be able to take Ozempic as she does not have DM so she'd be using it off-label for weight loss. ? ?Called pt to discuss. She is interested in trying Ozempic. She is not pregnant and is not planning on becoming pregnant. Denies personal/family hx of thyroid cancer. Sent in rx for Ozempic and advised pt to store the med in the [place] when she picks it up, then to bring med in to her appt with PharmD, scheduled on 3/13. Pt was appreciative for the call. ?

## 2021-06-19 ENCOUNTER — Other Ambulatory Visit: Payer: Self-pay

## 2021-06-19 ENCOUNTER — Ambulatory Visit (INDEPENDENT_AMBULATORY_CARE_PROVIDER_SITE_OTHER): Payer: 59 | Admitting: Pharmacist

## 2021-06-19 ENCOUNTER — Encounter: Payer: Self-pay | Admitting: Pharmacist

## 2021-06-19 VITALS — Wt 277.0 lb

## 2021-06-19 DIAGNOSIS — Z6841 Body Mass Index (BMI) 40.0 and over, adult: Secondary | ICD-10-CM | POA: Diagnosis not present

## 2021-06-19 NOTE — Progress Notes (Signed)
Patient ID: Tara Bradshaw                 DOB: Dec 07, 1985                    MRN: [id number] ? ? ? ? ?HPI: ?Tara Bradshaw is a 36 y.o. female patient referred to pharmacy clinic by Dr. Haroldine Laws to initiate weight loss therapy with GLP1-RA. PMH is significant for obesity complicated by chronic medical conditions including CHF, HTN, NICM, PVC and mild OSA. Most recent BMI 52.34.  ? ?Patient has not tried much to loose weight. Admits that soda, bread and sweets are her down fall. Also eats a lot of fast food. She gets exhausted very easily. States she will sometimes eat breakfast, usually fast food. If she eats breakfast she typically doesn't eat lunch. If she doesn't eat breakfast then she eats a late lunch- usually a burger or chicken nuggets from fast food. Drops kids off a school and then goes home and does stuff around the house and then tends to lie down until 1:30 then picks up kids. Has to drop husband off at work at 4 then comes home and prepares dinner.  ? ?Current weight management medications: none ? ?Previously tried meds: none ? ?Current meds that may affect weight: none ? ?Baseline weight/BMI: 277lb/52.3 ? ?Insurance payor: medicare/medicaid ? ?Diet:  ?-Breakfast:sometimes nothing, biscuit, eggs bacon ?-Lunch: fast food ?-Dinner: spaghetti, fried food, sauteed steak, salad, habachi at home, vegetables ?-Snacks: chips, "junk" ?-Drinks: soda (4-5 per day), water, juice milk ?Breads, sodas, sweets ? ?Exercise: walked to the corner and back the other day, was SOB when she got to the corner ? ?Family History: The patient's family history includes Asthma in her mother; Cancer in her paternal grandmother; Cancer (age of onset: 76) in her maternal aunt; Epilepsy in her mother; Hypertension in her maternal grandmother.  ? ?Social History: The patient  reports that she has never smoked. She has never used smokeless tobacco. She reports that she does not drink alcohol and does not use drugs.  ? ?Labs: ?Lab Results   ?Component Value Date  ? HGBA1C 5.6 07/03/2018  ? ? ?Wt Readings from Last 1 Encounters:  ?06/09/21 275 lb 9.6 oz (125 kg)  ? ? ?BP Readings from Last 1 Encounters:  ?06/09/21 118/78  ? ?Pulse Readings from Last 1 Encounters:  ?06/09/21 76  ? ? ?   ?Component Value Date/Time  ? CHOL 212 (H) 06/04/2007 2012  ? TRIG 88 06/04/2007 2012  ? HDL 46 06/04/2007 2012  ? CHOLHDL 4.6 Ratio 06/04/2007 2012  ? VLDL 18 06/04/2007 2012  ? [place] 148 (H) 06/04/2007 2012  ? ? ?Past Medical History:  ?Diagnosis Date  ? Anemia   ? CHRONIC  ? CHF (congestive heart failure) ([place])   ? Chronic systolic dysfunction of left ventricle   ? GERD (gastroesophageal reflux disease)   ? WITH PREGNANCY  ? Nonischemic cardiomyopathy ([place])   ? Pneumonia   ? x 1  ? PVC's (premature ventricular contractions)   ? ? ?Current Outpatient Medications on File Prior to Visit  ?Medication Sig Dispense Refill  ? acetaminophen (TYLENOL) 325 MG tablet Take 650 mg by mouth every 6 (six) hours as needed for mild pain or headache.    ? amiodarone (PACERONE) 200 MG tablet Take 0.5 tablets (100 mg total) by mouth daily. 45 tablet 3  ? amLODipine (NORVASC) 10 MG tablet Take 1 tablet (10 mg total) by mouth daily. 30 tablet  5  ? carvedilol (COREG) 6.25 MG tablet Take 1 tablet (6.25 mg total) by mouth 2 (two) times daily. 60 tablet 3  ? cyclobenzaprine (FLEXERIL) 10 MG tablet Take 1 tablet (10 mg total) by mouth 3 (three) times daily as needed for muscle spasms. 10 tablet 0  ? ENTRESTO 97-103 MG TAKE 1 TABLET BY MOUTH TWICE A DAY 60 tablet 6  ? FARXIGA 10 MG TABS tablet TAKE 1 TABLET BY MOUTH DAILY BEFORE BREAKFAST. 30 tablet 3  ? furosemide (LASIX) 40 MG tablet Take 40 mg by mouth 2 (two) times daily.    ? isosorbide-hydrALAZINE (BIDIL) 20-37.5 MG tablet Take 1 tablet by mouth in the morning and at bedtime. (Patient not taking: Reported on 05/05/2021) 180 tablet 3  ? levonorgestrel (MIRENA) 20 MCG/24HR IUD 1 each by Intrauterine route once.    ? meclizine (ANTIVERT) 25  MG tablet Take 1 tablet (25 mg total) by mouth 3 (three) times daily as needed for dizziness. 15 tablet 0  ? potassium chloride SA (KLOR-CON M) 20 MEQ tablet Take 1 tablet (20 mEq total) by mouth daily. 90 tablet 3  ? Semaglutide,0.25 or 0.5MG/DOS, (OZEMPIC, 0.25 OR 0.5 MG/DOSE,) 2 MG/1.5ML SOPN Inject 0.26m subcutaneously once weekly for 4 weeks, then increase to 0.574msubcutaneously for 4 weeks. 1.5 mL 1  ? spironolactone (ALDACTONE) 25 MG tablet TAKE 1 TABLET BY MOUTH EVERY DAY 30 tablet 6  ? ?No current facility-administered medications on file prior to visit.  ? ? ?No Known Allergies ? ? ?Assessment/Plan: ? ?1. Weight loss - Patient has not met goal of at least 5% of body weight loss with comprehensive lifestyle modifications alone in the past 3-6 months. Pharmacotherapy is appropriate to pursue as augmentation. Will start Ozempic. Confirmed patient not pregnant and no personal or family history of medullary thyroid carcinoma (MTC) or Multiple Endocrine Neoplasia syndrome type 2 (MEN 2). No hx of pancreatitis, gallstones or retinopathy. ? ?Advised patient on common side effects including nausea, diarrhea, dyspepsia, decreased appetite, and fatigue. Counseled patient on reducing meal size and how to titrate medication to minimize side effects. Counseled patient to call if intolerable side effects or if experiencing dehydration, abdominal pain, or dizziness.  ? ?Patient consumes a large amount of soda, juice and processed foods. We talked about the risk of developing DM and fatty liver with a diet so high in fructose and highly processed foods. Talked about how there is no miracle drug and she will have to improve what she eats to keep the weight off and improve her health. We decided to make small changes at a time. Will start with cutting back on soda. Goal will be to reduce to 2 cans per day and no juice. Will set a new goal next month. ? ?I encouraged her to continue to work on her exercise. Goal is to walk  to corner and back twice per day. Patient is in agreement with both of these goals and thinks they are obtainable. Discussed how if she cannot walk 2 times in a row, it is ok to rest and try walking again either after resting or later in the day. Continue to walk and increase as able. We all have to start somewhere. 5 min here and there are collective. Think of exercise as a daily pill to keep usKoreaealthy. ? ?[facility]. ? ?Injection technique reviewed at today's visit and patient successfully self-administered first dose of Ozempic 0.2574mnto the fatty tissue  of the abdomen. ? ?Titration Plan:  ?Will plan to follow the titration plan as below, pending patient is tolerating each dose before increasing to the next. Can slow titration if needed for tolerability.  ?  ?-Month 1: Inject 0.18m SQ once weekly x 4 weeks ?-Month 2: Inject 0.562mSQ once weekly x 4 weeks ?-Month 3: Inject 11m89mQ once weekly x 4 weeks ?-Month 4+: Inject 2mg9m once weekly  ? ?Follow up in 1 month via telephone. ? ?

## 2021-06-19 NOTE — Patient Instructions (Addendum)
Goal for this month is reduce soda intake to 2 cans per day. Try not to drink any juice Goal for this month - walk to the corner and back twice per day Call me at [phone] with any questions  GLP-1 Receptor Agonist Counseling Points This medication reduces your appetite and may make you feel fuller longer.  Stop eating when your body tells you that you are full. This will likely happen sooner than you are used to. Store your medication in the fridge until you are ready to use it. Inject your medication in the fatty tissue of your lower abdominal area (2 inches away from belly button) or upper outer thigh. Rotate injection sites. Each pen will last you about 1 month (the first month it will last a few weeks longer). Use a different needle with each weekly injection. Common side effects include: nausea, diarrhea/constipation, and heartburn, and are more likely to occur if you overeat.  Dosing schedule: - Month 1: Inject 0.'25mg'$  subcutaneously once weekly for 4 weeks - Month 2: Inject 0.'5mg'$   subcutaneously once weekly for 4 weeks - Month 3: Inject '1mg'$  subcutaneously once weekly for 4 weeks - Month 4: Inject '2mg'$  subcutaneously once weekly  Tips for living a healthier life     Building a Healthy and Balanced Diet Make most of your meal vegetables and fruits -  of your plate. Aim for color and variety, and remember that potatoes dont count as vegetables on the Healthy Eating Plate because of their negative impact on blood sugar.  Go for whole grains -  of your plate. Whole and intact grains--whole wheat, barley, wheat berries, quinoa, oats, brown rice, and foods made with them, such as whole wheat pasta--have a milder effect on blood sugar and insulin than white bread, white rice, and other refined grains.  Protein power -  of your plate. Fish, poultry, beans, and nuts are all healthy, versatile protein sources--they can be mixed into salads, and pair well with vegetables on a plate.  Limit red meat, and avoid processed meats such as bacon and sausage.  Healthy plant oils - in moderation. Choose healthy vegetable oils like olive, canola, soy, corn, sunflower, peanut, and others, and avoid partially hydrogenated oils, which contain unhealthy trans fats. Remember that low-fat does not mean healthy.  Drink water, coffee, or tea. Skip sugary drinks, limit milk and dairy products to one to two servings per day, and limit juice to a small glass per day.  Stay active. The red figure running across the [place] is a reminder that staying active is also important in weight control.  The main message of the Healthy Eating Plate is to focus on diet quality:  The type of carbohydrate in the diet is more important than the amount of carbohydrate in the diet, because some sources of carbohydrate--like vegetables (other than potatoes), fruits, whole grains, and beans--are healthier than others. The Healthy Eating Plate also advises consumers to avoid sugary beverages, a major source of calories--usually with little nutritional value--in the American diet. The Healthy Eating Plate encourages consumers to use healthy oils, and it does not set a maximum on the percentage of calories people should get each day from healthy sources of fat. In this way, the Healthy Eating Plate recommends the opposite of the low-fat message promoted for decades by the USDA.  DeskDistributor.no  SUGAR  Sugar is a huge problem in the modern day diet. Sugar is a big contributor to heart disease, diabetes, high triglyceride  levels, fatty liver disease and obesity. Sugar is hidden in almost all packaged foods/beverages. Added sugar is extra sugar that is added beyond what is naturally found and has no nutritional benefit for your body. The American Heart Association recommends limiting added sugars to no more than 25g for women and 36 grams for  men per day. There are many names for sugar including maltose, sucrose (names ending in "ose"), high fructose corn syrup, molasses, cane sugar, corn sweetener, raw sugar, syrup, honey or fruit juice concentrate.   One of the best ways to limit your added sugars is to stop drinking sweetened beverages such as soda, sweet tea, and fruit juice.  There is 65g of added sugars in one 20oz bottle of Coke! That is equal to 7.5 donuts.   Pay attention and read all nutrition facts labels. Below is an examples of a nutrition facts label. The #1 is showing you the total sugars where the # 2 is showing you the added sugars. This one serving has almost the max amount of added sugars per day!     20 oz Soda 65g Sugar = 7.5 Glazed Donuts  16oz Energy  Drink 54g Sugar = 6.5 Glazed Donuts  Large Sweet  Tea 38g Sugar = 4 Glazed Donuts  20oz Sports  Drink 34g Sugar = 3.5 Glazed Donuts  8oz Chocolate Milk 24g Sugar =2.5 Glazed Donuts  8oz Orange  Juice 21g Sugar = 2 Glazed Donuts  1 Juice Box 14g Sugar = 1.5 Glazed Donuts  16oz Water= NO SUGAR!!  EXERCISE  Exercise is good. Weve all heard that. In an ideal world, we would all have time and resources to get plenty of it. When you are active, your heart pumps more efficiently and you will feel better.  Multiple studies show that even walking regularly has benefits that include living a longer life. The American Heart Association recommends 150 minutes per week of exercise (30 minutes per day most days of the week). You can do this in any increment you wish. Nine or more 10-minute walks count. So does an hour-long exercise class. Break the time apart into what will work in your life. Some of the best things you can do include walking briskly, jogging, cycling or swimming laps. Not everyone is ready to exercise. Sometimes we need to start with just getting active. Here are some easy ways to be more active throughout the day:  Take the stairs  instead of the elevator  Go for a 10-15 minute walk during your lunch break (find a friend to make it more enjoyable)  When shopping, park at the back of the parking lot  If you take public transportation, get off one stop early and walk the extra distance  Pace around while making phone calls  Check with your doctor if you arent sure what your limitations may be. Always remember to drink plenty of water when doing any type of exercise. Dont feel like a failure if youre not getting the 90-150 minutes per week. If you started by being a couch potato, then just a 10-minute walk each day is a huge improvement. Start with little victories and work your way up.   HEALTHY EATING TIPS  When looking to improve your eating habits, whether to lose weight, lower blood pressure or just be healthier, it helps to know what a serving size is.   Grains 1 slice of bread,  bagel,  cup pasta or rice  Vegetables 1 cup fresh or raw  vegetables,  cup cooked or canned Fruits 1 piece of medium sized fruit,  cup canned,   Meats/Proteins  cup dried       1 oz meat, 1 egg,  cup cooked beans, nuts or seeds  Dairy        Fats Individual yogurt container, 1 cup (8oz)    1 teaspoon margarine/butter or vegetable  milk or milk alternative, 1 slice of cheese          oil; 1 tablespoon mayonnaise or salad dressing                  Plan ahead: make a menu of the meals for a week then create a grocery list to go with that menu. Consider meals that easily stretch into a night of leftovers, such as stews or casseroles. Or consider making two of your favorite meal and put one in the freezer for another night. Try a night or two each week that is meatless or no cook such as salads. When you get home from the grocery store wash and prepare your vegetables and fruits. Then when you need them they are ready to go.   Tips for going to the grocery store:  [place] store or generic brands  Check the weekly ad from your store  on-line or in their in-store flyer  Look at the unit price on the shelf tag to compare/contrast the costs of different items  Buy fruits/vegetables in season  Carrots, bananas and apples are low-cost, naturally healthy items  If meats or frozen vegetables are on sale, buy some extras and put in your freezer  Limit buying prepared or ready to eat items, even if they are pre-made salads or fruit snacks  Do not shop when youre hungry  Foods at eye level tend to be more expensive. Look on the high and low shelves for deals.  Consider shopping at the farmers market for fresh foods in season.  Avoid the cookie and chip aisles (these are expensive, high in calories and low in nutritional value). Shop on the outside of the grocery store.  Healthy food preparations:  If you cant get lean hamburger, be sure to drain the fat when cooking  Steam, saut (in olive oil), grill or bake foods  Experiment with different seasonings to avoid adding salt to your foods. Kosher salt, sea salt and Himalayan salt are all still salt and should be avoided. Try seasoning food with onion, garlic, thyme, rosemary, basil ect. Onion powder or garlic powder is ok. Avoid if it says salt (ie garlic salt).

## 2021-06-23 ENCOUNTER — Telehealth: Payer: Self-pay

## 2021-06-23 NOTE — Telephone Encounter (Signed)
Call to patient reference PREP referral  ?Explained program ?Pt needs evening class 615p-730p.  ?Offered next one for [place] T/R 615p-730p ?If we can start another earlier will call her.  ? ?

## 2021-07-10 ENCOUNTER — Ambulatory Visit (INDEPENDENT_AMBULATORY_CARE_PROVIDER_SITE_OTHER): Payer: 59

## 2021-07-10 DIAGNOSIS — I5022 Chronic systolic (congestive) heart failure: Secondary | ICD-10-CM | POA: Diagnosis not present

## 2021-07-10 DIAGNOSIS — Z95810 Presence of automatic (implantable) cardiac defibrillator: Secondary | ICD-10-CM | POA: Diagnosis not present

## 2021-07-13 ENCOUNTER — Telehealth: Payer: Self-pay

## 2021-07-13 NOTE — Telephone Encounter (Signed)
The patient agreed to send missed ICM transmission today.  ?

## 2021-07-14 ENCOUNTER — Telehealth: Payer: Self-pay

## 2021-07-14 NOTE — Progress Notes (Signed)
EPIC Encounter for ICM Monitoring ? ?Patient Name: Tara Bradshaw is a 36 y.o. female ?Date: 07/14/2021 ?Primary Care Physican: Patient, No Pcp Per (Inactive) ?Primary Cardiologist: [place] ?Electrophysiologist: Allred ?05/05/2021 Office Weight: 268 lbs   ?  ?         ?      Attempted call to patient and unable to reach.  Left message to return call. Transmission reviewed.  ?  ?Corvue thoracic impedance suggesting possible fluid accumulation starting 4/4 ?  ?Prescribed: ?Furosemide 40 mg take 1 tablet by mouth daily. ?Potassium 20 mEq take 1 tablet daily.  ?  ?Labs: ?05/05/2021 Creatinine 0.69, BUN 9,   Potassium 4.4, Sodium 138, GFR 115 ?07/17/2020 Creatinine 0.67, BUN 10, Potassium 4.3, Sodium 138, GFR <60 ?05/19/2020 Creatinine 0.61, BUN 7,   Potassium 4.0, Sodium 139 ?A complete set of results can be found in Results Review. ?  ?Recommendations:   Unable to reach.  ?  ?Follow-up plan: ICM clinic phone appointment on 07/24/2021 to recheck fluid levels.   91 day device clinic remote transmission 09/01/2021.     ?  ?EP/Cardiology Office Visits: 10/05/2021 with Dr Haroldine Laws.   Recall 04/27/2020 with Dr Rayann Heman.  Message sent to EP scheduler to contact patient to schedule overdue EP OV. ?  ?Copy of ICM check sent to Dr. Rayann Heman.   Will send to Dr Haroldine Laws for review if patient is reached.   ? ?3 month ICM trend: 07/13/2021. ? ? ? ?12-14 Month ICM trend:  ? ? ? ?[name], RN ?07/14/2021 ?8:18 AM ? ?

## 2021-07-14 NOTE — Telephone Encounter (Signed)
Remote ICM transmission received.  Attempted call to patient regarding ICM remote transmission and left message per DPR to return call.   

## 2021-07-17 ENCOUNTER — Telehealth: Payer: Self-pay | Admitting: Pharmacist

## 2021-07-17 NOTE — Telephone Encounter (Signed)
Spoke with patient to see how she was doing on Ozempic. She has given 5 injections of 0.'25mg'$ . She gets nausea on day she takes the medication several hours after giving the injection but it doesn't last long. I recommended she try taking at night. ?She is down to 1 or no soda per day, almost no juice. Drinking more water. ?Went to [place] and did a lot of walking ?Is walking to corner and back twice in a day pretty much everyday. Does have to stop sometimes and rest and restart.Waiting for [employer] prep class to open. ?Trying to do less fried foods. Making better choices if she does eat out.  ?Advised to increase to Ozempic 0.'5mg'$  next dose.  ?Set goal of no soda and walking to the corner and back 3 times. ?Will follow up in 3 weeks. ? ?  ?

## 2021-07-23 IMAGING — CR DG CHEST 2V
2 series · 2 of 2 positions shown · non-contrast
Comparison: Chest radiograph dated 07/03/2018.

CLINICAL DATA: 33-year-old female with AICD placement.

EXAM:
CHEST - 2 VIEW

[w chest lat]
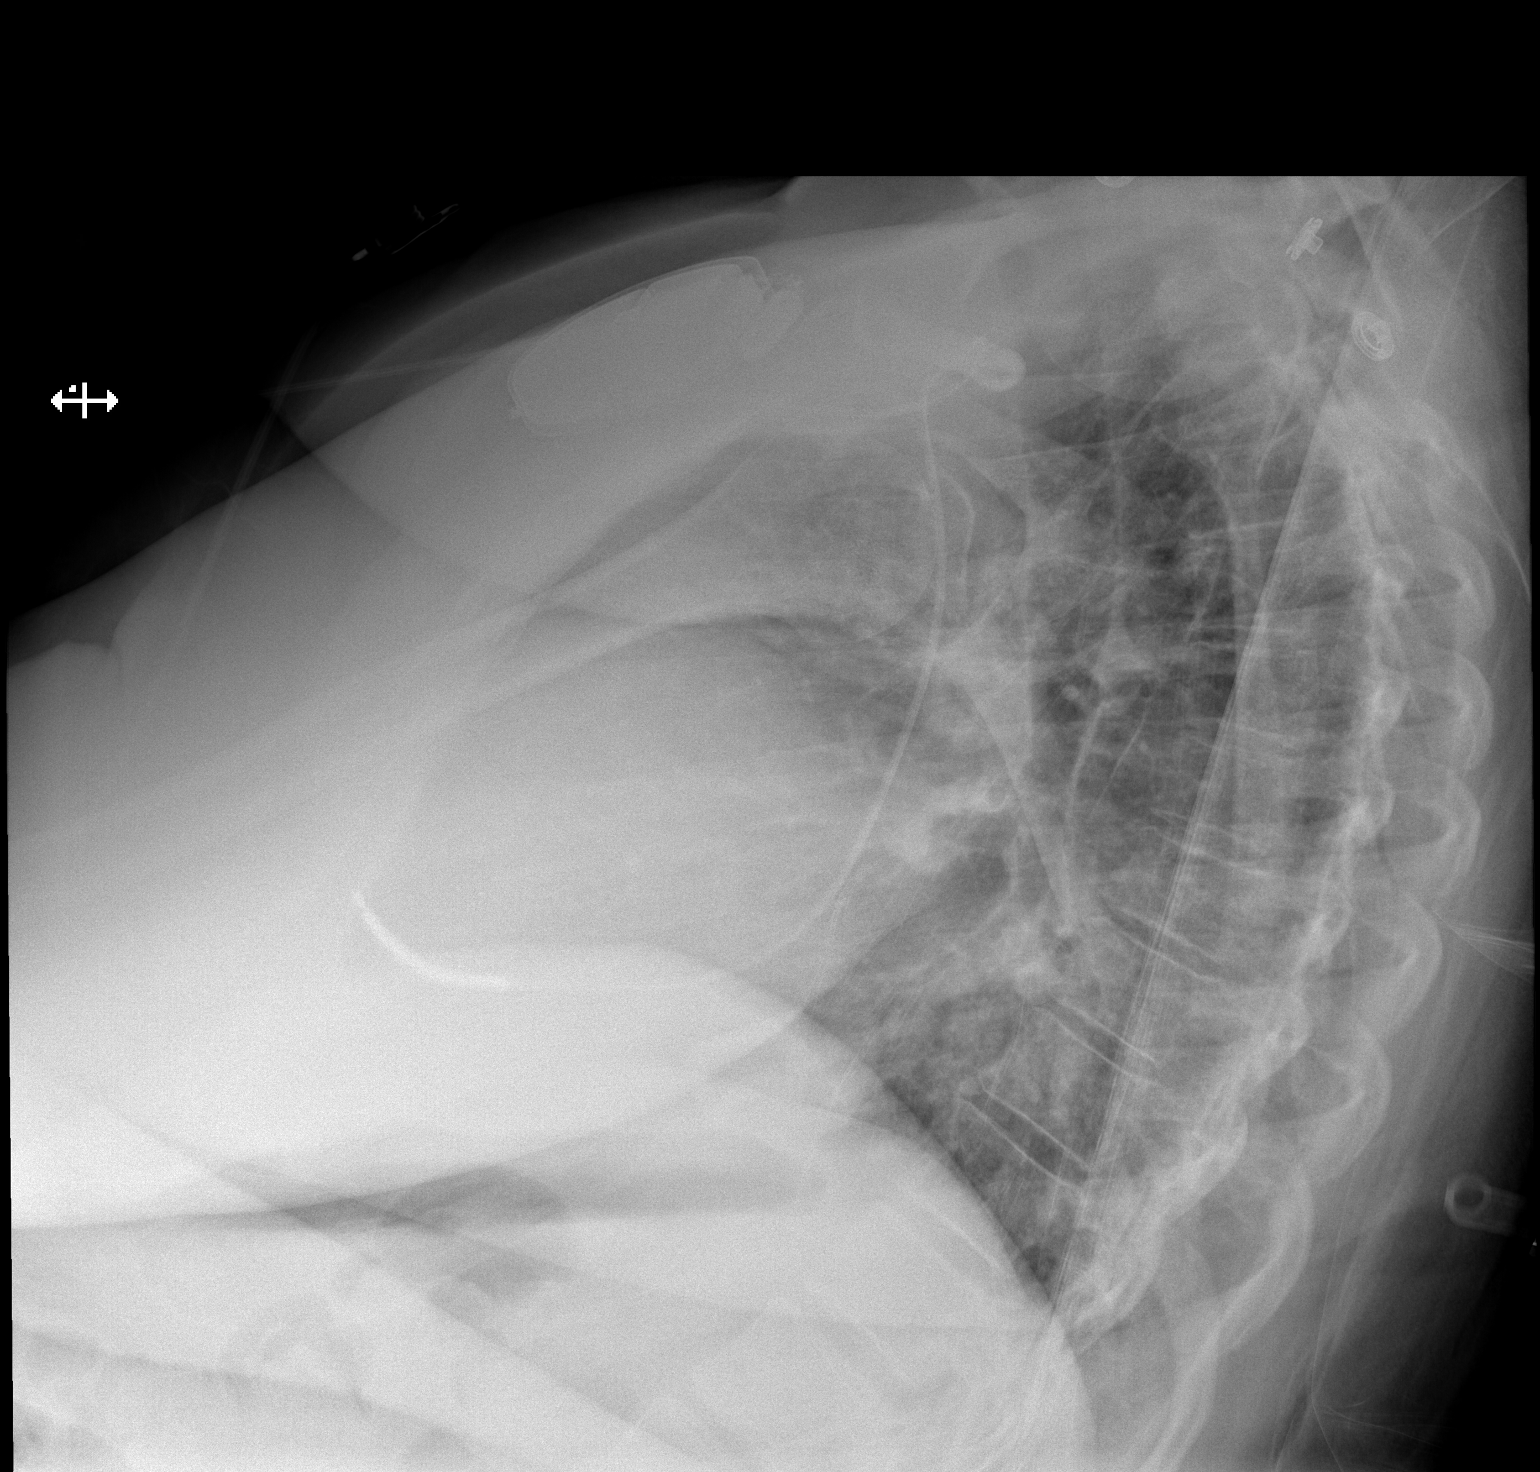

[x chest ap]
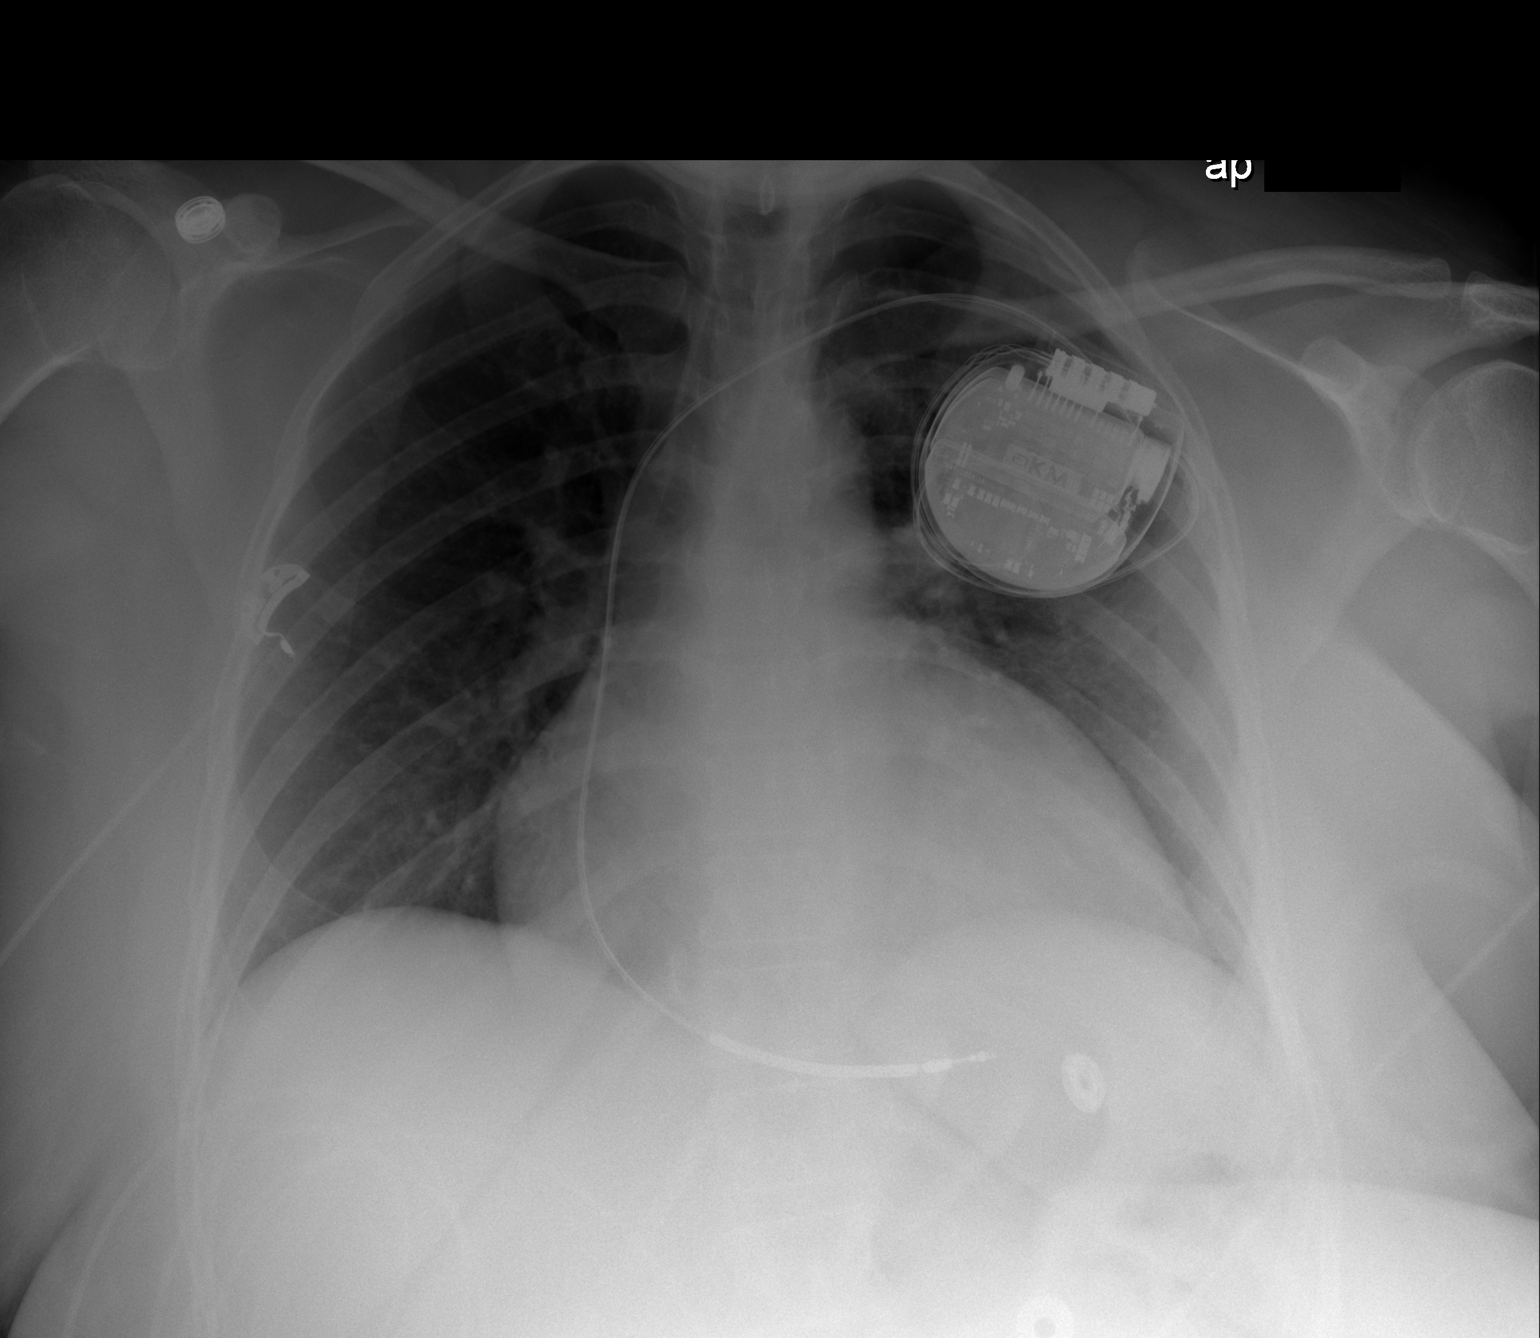

[2 of 2 positions shown; findings below may reference images not displayed]

FINDINGS: The lungs are clear. There is no pleural effusion pneumothorax.
Stable cardiomegaly. Left pectoral single lead AICD device with lead
appearing in the right ventricle. No acute osseous pathology.
IMPRESSION: 1. No active cardiopulmonary disease.
2. AICD.  No pneumothorax.

## 2021-07-24 ENCOUNTER — Ambulatory Visit (INDEPENDENT_AMBULATORY_CARE_PROVIDER_SITE_OTHER): Payer: 59

## 2021-07-24 DIAGNOSIS — Z95810 Presence of automatic (implantable) cardiac defibrillator: Secondary | ICD-10-CM

## 2021-07-24 DIAGNOSIS — I5022 Chronic systolic (congestive) heart failure: Secondary | ICD-10-CM

## 2021-07-25 NOTE — Progress Notes (Signed)
EPIC Encounter for ICM Monitoring ? ?Patient Name: Tara Bradshaw is a 36 y.o. female ?Date: 07/25/2021 ?Primary Care Physican: Patient, No Pcp Per (Inactive) ?Primary Cardiologist: [place] ?Electrophysiologist: Allred ?05/05/2021 Office Weight: 268 lbs   ?  ?         ?      Transmission reviewed.  ?  ?Corvue thoracic impedance suggesting fluid levels returned to normal. ?  ?Prescribed: ?Furosemide 40 mg take 1 tablet by mouth daily. ?Potassium 20 mEq take 1 tablet daily.  ?  ?Labs: ?05/05/2021 Creatinine 0.69, BUN 9,   Potassium 4.4, Sodium 138, GFR 115 ?07/17/2020 Creatinine 0.67, BUN 10, Potassium 4.3, Sodium 138, GFR <60 ?05/19/2020 Creatinine 0.61, BUN 7,   Potassium 4.0, Sodium 139 ?A complete set of results can be found in Results Review. ?  ?Recommendations:   No changes. ?  ?Follow-up plan: ICM clinic phone appointment on 08/21/2021.   91 day device clinic remote transmission 09/01/2021.     ?  ?EP/Cardiology Office Visits: 10/05/2021 with Dr Haroldine Laws.   10/23/2021 with Dr Quentin Ore. ?  ?Copy of ICM check sent to Dr. Rayann Heman.  ? ?3 month ICM trend: 07/24/2021. ? ? ? ?12-14 Month ICM trend:  ? ? ? ?[name], RN ?07/25/2021 ?1:56 PM ? ?

## 2021-08-08 NOTE — Telephone Encounter (Signed)
Called pt to see how she was doing on ozempic 0.'5mg'$  weekly. ?LVM for patient to call back ?Last set a goal of no more soda and walking to the corner and back 3 times. ?Due to increase to '1mg'$  next week. ?

## 2021-08-21 ENCOUNTER — Ambulatory Visit (INDEPENDENT_AMBULATORY_CARE_PROVIDER_SITE_OTHER): Payer: 59

## 2021-08-21 DIAGNOSIS — I5022 Chronic systolic (congestive) heart failure: Secondary | ICD-10-CM

## 2021-08-21 DIAGNOSIS — Z95810 Presence of automatic (implantable) cardiac defibrillator: Secondary | ICD-10-CM | POA: Diagnosis not present

## 2021-08-21 NOTE — Progress Notes (Signed)
EPIC Encounter for ICM Monitoring ? ?Patient Name: Tara Bradshaw is a 35 y.o. female ?Date: 08/21/2021 ?Primary Care Physican: Patient, No Pcp Per (Inactive) ?Primary Cardiologist: [place] ?Electrophysiologist: Allred ?05/05/2021 Office Weight: 268 lbs   ?  ?         ?      Attempted call to patient and unable to reach.  Left message to return call.  Transmission reviewed.  ?  ?Corvue thoracic impedance suggesting normal fluid levels. ?  ?Prescribed: ?Furosemide 40 mg take 1 tablet by mouth daily. ?Potassium 20 mEq take 1 tablet daily.  ?  ?Labs: ?05/05/2021 Creatinine 0.69, BUN 9,   Potassium 4.4, Sodium 138, GFR 115 ?07/17/2020 Creatinine 0.67, BUN 10, Potassium 4.3, Sodium 138, GFR <60 ?05/19/2020 Creatinine 0.61, BUN 7,   Potassium 4.0, Sodium 139 ?A complete set of results can be found in Results Review. ?  ?Recommendations:   Unable to reach.   ?  ?Follow-up plan: ICM clinic phone appointment on 09/25/2021.   91 day device clinic remote transmission 09/01/2021.     ?  ?EP/Cardiology Office Visits: 10/05/2021 with Dr Haroldine Laws.   10/23/2021 with Dr Quentin Ore. ?  ?Copy of ICM check sent to Dr. Rayann Heman.   ? ?3 month ICM trend: 08/20/2021. ? ? ? ? ?[name], RN ?08/21/2021 ?8:02 AM ? ?

## 2021-08-22 ENCOUNTER — Telehealth: Payer: Self-pay

## 2021-08-22 NOTE — Telephone Encounter (Signed)
Remote ICM transmission received.  Attempted call to patient regarding ICM remote transmission and left message to return call   

## 2021-08-23 MED ORDER — OZEMPIC (1 MG/DOSE) 4 MG/3ML ~~LOC~~ SOPN: 1.0000 mg | PEN_INJECTOR | SUBCUTANEOUS | 0 refills | Status: DC

## 2021-08-23 NOTE — Addendum Note (Signed)
Addended by: Marcelle Overlie D on: 08/23/2021 02:07 PM ? ? Modules accepted: Orders ? ?

## 2021-08-23 NOTE — Telephone Encounter (Signed)
Patient returned your call.

## 2021-08-23 NOTE — Telephone Encounter (Signed)
Spoke with patient. Has diarrhea here and there, some nausea- taking at night now. Doesn't last as long.  ?Has stopped drinking soda, working on cutting out fast food. Trying to cook more at home. ?Walking to the corner and back twice. This is getting easier for her, doesn't have to rest until after the second time. ?Will set a goal to walk to corner and back 3x. ?Increase ozempic to '1mg'$  weekly. Will follow up in 3 weeks. ?

## 2021-08-30 ENCOUNTER — Ambulatory Visit (HOSPITAL_COMMUNITY): Admission: RE | Admit: 2021-08-30 | Payer: 59 | Source: Ambulatory Visit

## 2021-09-01 ENCOUNTER — Ambulatory Visit (INDEPENDENT_AMBULATORY_CARE_PROVIDER_SITE_OTHER): Payer: 59

## 2021-09-01 DIAGNOSIS — I428 Other cardiomyopathies: Secondary | ICD-10-CM

## 2021-09-05 ENCOUNTER — Other Ambulatory Visit: Payer: Self-pay

## 2021-09-05 LAB — CUP PACEART REMOTE DEVICE CHECK
Battery Remaining Longevity: 91 mo
Battery Remaining Percentage: 75 %
Battery Voltage: 2.98 V
Brady Statistic RV Percent Paced: 1 %
Date Time Interrogation Session: 20230526020104
HighPow Impedance: 69 Ohm
Implantable Lead Implant Date: 20210107
Implantable Lead Location: 753860
Implantable Pulse Generator Implant Date: 20201112
Lead Channel Impedance Value: 330 Ohm
Lead Channel Pacing Threshold Amplitude: 1.25 V
Lead Channel Pacing Threshold Pulse Width: 0.5 ms
Lead Channel Sensing Intrinsic Amplitude: 11.9 mV
Lead Channel Setting Pacing Amplitude: 2.5 V
Lead Channel Setting Pacing Pulse Width: 0.5 ms
Lead Channel Setting Sensing Sensitivity: 0.5 mV
Pulse Gen Serial Number: 111012702

## 2021-09-11 NOTE — Progress Notes (Signed)
Remote ICD transmission.   

## 2021-09-12 ENCOUNTER — Telehealth: Payer: Self-pay

## 2021-09-12 NOTE — Telephone Encounter (Signed)
Called pt to offer next class at [facility] 10/03/21 6p-715p T/TH. Confirmed interest Intake scheduled for 6/20 at 5pm

## 2021-09-14 ENCOUNTER — Other Ambulatory Visit (HOSPITAL_COMMUNITY): Payer: Self-pay | Admitting: Internal Medicine

## 2021-09-20 ENCOUNTER — Telehealth: Payer: Self-pay | Admitting: Pharmacist

## 2021-09-20 MED ORDER — OZEMPIC (1 MG/DOSE) 4 MG/3ML ~~LOC~~ SOPN: 1.0000 mg | PEN_INJECTOR | SUBCUTANEOUS | 11 refills | Status: DC

## 2021-09-20 NOTE — Telephone Encounter (Signed)
Called pt to follow up on how she is doing on Ozempic '1mg'$  weekly. Has she been able to walk to the corner and back 3 x?  LVM for pt to call back

## 2021-09-20 NOTE — Telephone Encounter (Signed)
Spoke with patient. She would like to stay at '1mg'$ . Rx sent to pharmacy. Reports drinking a lot more water. 0-1 sodas per day. Less fast food. She is walking 2-3 times to the corner and back. F/U apt scheduled for Sept.

## 2021-09-25 ENCOUNTER — Ambulatory Visit (INDEPENDENT_AMBULATORY_CARE_PROVIDER_SITE_OTHER): Payer: 59

## 2021-09-25 DIAGNOSIS — Z95810 Presence of automatic (implantable) cardiac defibrillator: Secondary | ICD-10-CM | POA: Diagnosis not present

## 2021-09-25 DIAGNOSIS — I5022 Chronic systolic (congestive) heart failure: Secondary | ICD-10-CM

## 2021-09-26 ENCOUNTER — Telehealth: Payer: Self-pay

## 2021-09-26 NOTE — Telephone Encounter (Signed)
Call to pt, left message requesting call back Had her scheduled for intake for PREP at [facility] today at [place]. But did not come.  Does she want to reschedule?

## 2021-09-27 NOTE — Progress Notes (Signed)
EPIC Encounter for ICM Monitoring  Patient Name: Tara Bradshaw is a 36 y.o. female Date: 09/27/2021 Primary Care Physican: Patient, No Pcp Per Primary Cardiologist: [place] Electrophysiologist: Allred 05/05/2021 Office Weight: 268 lbs                    Attempted call to patient and unable to reach.  Left message to return call.  Transmission reviewed.    Corvue thoracic impedance suggesting normal fluid levels.   Prescribed: Furosemide 40 mg take 1 tablet by mouth daily. Potassium 20 mEq take 1 tablet daily.    Labs: 05/05/2021 Creatinine 0.69, BUN 9,   Potassium 4.4, Sodium 138, GFR 115 07/17/2020 Creatinine 0.67, BUN 10, Potassium 4.3, Sodium 138, GFR <60 05/19/2020 Creatinine 0.61, BUN 7,   Potassium 4.0, Sodium 139 A complete set of results can be found in Results Review.   Recommendations:   Unable to reach.     Follow-up plan: ICM clinic phone appointment on 7/06/07/2021.   91 day device clinic remote transmission 12/01/2021.       EP/Cardiology Office Visits: 10/05/2021 with Dr Haroldine Laws.   10/23/2021 with Dr Quentin Ore (message sent to check in regarding DPR).   Copy of ICM check sent to Dr. Rayann Heman.    3 month ICM trend: 09/25/2021.    12-14 Month ICM trend:     [name], RN 09/27/2021 12:51 PM

## 2021-09-29 ENCOUNTER — Telehealth: Payer: Self-pay

## 2021-09-29 NOTE — Telephone Encounter (Signed)
No call back from [facility] left earlier in week about rescheduling intake appt for PREP Sent text this am to inquire if she was still interested.

## 2021-10-05 ENCOUNTER — Encounter (HOSPITAL_COMMUNITY): Payer: 59 | Admitting: Internal Medicine

## 2021-10-09 ENCOUNTER — Ambulatory Visit (HOSPITAL_COMMUNITY): Payer: Medicare Other | Attending: Cardiology

## 2021-10-09 DIAGNOSIS — I493 Ventricular premature depolarization: Secondary | ICD-10-CM | POA: Diagnosis not present

## 2021-10-09 DIAGNOSIS — Z95810 Presence of automatic (implantable) cardiac defibrillator: Secondary | ICD-10-CM | POA: Diagnosis not present

## 2021-10-09 DIAGNOSIS — I428 Other cardiomyopathies: Secondary | ICD-10-CM | POA: Diagnosis not present

## 2021-10-09 DIAGNOSIS — I509 Heart failure, unspecified: Secondary | ICD-10-CM | POA: Diagnosis not present

## 2021-10-09 DIAGNOSIS — I429 Cardiomyopathy, unspecified: Secondary | ICD-10-CM | POA: Diagnosis not present

## 2021-10-09 DIAGNOSIS — I340 Nonrheumatic mitral (valve) insufficiency: Secondary | ICD-10-CM

## 2021-10-09 LAB — ECHOCARDIOGRAM COMPLETE
Area-P 1/2: 4.96 cm2
Calc EF: 34 %
S' Lateral: 6.1 cm
Single Plane A2C EF: 35.9 %
Single Plane A4C EF: 27.5 %

## 2021-10-23 ENCOUNTER — Ambulatory Visit (HOSPITAL_COMMUNITY)
Admission: RE | Admit: 2021-10-23 | Discharge: 2021-10-23 | Disposition: A | Discharge status: Home / Self Care | Payer: Medicare Other | Source: Ambulatory Visit | Attending: Internal Medicine | Admitting: Internal Medicine

## 2021-10-23 ENCOUNTER — Encounter: Payer: Self-pay | Admitting: Cardiology

## 2021-10-23 ENCOUNTER — Ambulatory Visit (INDEPENDENT_AMBULATORY_CARE_PROVIDER_SITE_OTHER): Payer: Medicare Other | Admitting: Cardiology

## 2021-10-23 ENCOUNTER — Encounter (HOSPITAL_COMMUNITY): Payer: Self-pay | Admitting: Internal Medicine

## 2021-10-23 ENCOUNTER — Other Ambulatory Visit (HOSPITAL_COMMUNITY): Payer: Self-pay

## 2021-10-23 VITALS — BP 128/82 | HR 75 | Ht 62.0 in | Wt 265.2 lb

## 2021-10-23 VITALS — BP 124/82 | HR 77 | Wt 263.8 lb

## 2021-10-23 DIAGNOSIS — I5022 Chronic systolic (congestive) heart failure: Secondary | ICD-10-CM | POA: Insufficient documentation

## 2021-10-23 DIAGNOSIS — Z79899 Other long term (current) drug therapy: Secondary | ICD-10-CM | POA: Insufficient documentation

## 2021-10-23 DIAGNOSIS — I472 Ventricular tachycardia, unspecified: Secondary | ICD-10-CM | POA: Insufficient documentation

## 2021-10-23 DIAGNOSIS — Z95810 Presence of automatic (implantable) cardiac defibrillator: Secondary | ICD-10-CM

## 2021-10-23 DIAGNOSIS — J069 Acute upper respiratory infection, unspecified: Secondary | ICD-10-CM | POA: Diagnosis not present

## 2021-10-23 DIAGNOSIS — G4733 Obstructive sleep apnea (adult) (pediatric): Secondary | ICD-10-CM

## 2021-10-23 DIAGNOSIS — Z20822 Contact with and (suspected) exposure to covid-19: Secondary | ICD-10-CM | POA: Diagnosis not present

## 2021-10-23 DIAGNOSIS — I493 Ventricular premature depolarization: Secondary | ICD-10-CM | POA: Diagnosis not present

## 2021-10-23 DIAGNOSIS — R002 Palpitations: Secondary | ICD-10-CM | POA: Diagnosis not present

## 2021-10-23 DIAGNOSIS — I110 Hypertensive heart disease with heart failure: Secondary | ICD-10-CM | POA: Diagnosis not present

## 2021-10-23 DIAGNOSIS — I428 Other cardiomyopathies: Secondary | ICD-10-CM | POA: Insufficient documentation

## 2021-10-23 LAB — COMPREHENSIVE METABOLIC PANEL
ALT: 20 U/L (ref 0–44)
AST: 15 U/L (ref 15–41)
Albumin: 4 g/dL (ref 3.5–5.0)
Alkaline Phosphatase: 54 U/L (ref 38–126)
Anion gap: 10 (ref 5–15)
BUN: 6 mg/dL (ref 6–20)
CO2: 22 mmol/L (ref 22–32)
Calcium: 9.2 mg/dL (ref 8.9–10.3)
Chloride: 104 mmol/L (ref 98–111)
Creatinine, Ser: 0.53 mg/dL (ref 0.44–1.00)
GFR, Estimated: >60 mL/min (ref >60)
Glucose, Bld: 88 mg/dL (ref 70–99)
Potassium: 3.9 mmol/L (ref 3.5–5.1)
Sodium: 136 mmol/L (ref 135–145)
Total Bilirubin: 0.7 mg/dL (ref 0.3–1.2)
Total Protein: 7.2 g/dL (ref 6.5–8.1)

## 2021-10-23 LAB — BRAIN NATRIURETIC PEPTIDE: B Natriuretic Peptide: 84.9 pg/mL (ref 0.0–100.0)

## 2021-10-23 MED ORDER — FUROSEMIDE 40 MG PO TABS: 40.0000 mg | ORAL_TABLET | Freq: Two times a day (BID) | ORAL | 3 refills | Status: DC

## 2021-10-23 MED ORDER — POTASSIUM CHLORIDE CRYS ER 20 MEQ PO TBCR: 20.0000 meq | EXTENDED_RELEASE_TABLET | Freq: Every day | ORAL | 3 refills | Status: DC

## 2021-10-23 MED ORDER — AMLODIPINE BESYLATE 10 MG PO TABS: 10.0000 mg | ORAL_TABLET | Freq: Every day | ORAL | 3 refills | Status: DC

## 2021-10-23 MED ORDER — SPIRONOLACTONE 25 MG PO TABS: 25.0000 mg | ORAL_TABLET | Freq: Every day | ORAL | 3 refills | Status: DC

## 2021-10-23 MED ORDER — DAPAGLIFLOZIN PROPANEDIOL 10 MG PO TABS: 10.0000 mg | ORAL_TABLET | Freq: Every day | ORAL | 3 refills | Status: DC

## 2021-10-23 MED ORDER — ENTRESTO 97-103 MG PO TABS: 1.0000 | ORAL_TABLET | Freq: Two times a day (BID) | ORAL | 3 refills | Status: DC

## 2021-10-23 MED ORDER — METOPROLOL SUCCINATE ER 25 MG PO TB24: 25.0000 mg | ORAL_TABLET | Freq: Every day | ORAL | 6 refills | Status: DC

## 2021-10-23 NOTE — Patient Instructions (Signed)
Medication Instructions:  Stop Amiodarone Your physician recommends that you continue on your current medications as directed. Please refer to the Current Medication list given to you today. *If you need a refill on your cardiac medications before your next appointment, please call your pharmacy*  Lab Work: None. If you have labs (blood work) drawn today and your tests are completely normal, you will receive your results only by: [place] (if you have MyChart) OR A paper copy in the mail If you have any lab test that is abnormal or we need to change your treatment, we will call you to review the results.  Testing/Procedures: None.  Follow-Up: At [facility], you and your health needs are our priority.  As part of our continuing mission to provide you with exceptional heart care, we have created designated Provider Care Teams.  These Care Teams include your primary Cardiologist (physician) and Advanced Practice Providers (APPs -  Physician Assistants and Nurse Practitioners) who all work together to provide you with the care you need, when you need it.  Your physician wants you to follow-up in: 12 months with one of the following Advanced Practice Providers on your designated Care Team:    Tommye Standard, PA-C Legrand Como "Jonni Sanger" [place], [place]   You will receive a reminder letter in the mail two months in advance. If you don't receive a letter, please call our office to schedule the follow-up appointment.  We recommend signing up for the patient portal called "MyChart".  Sign up information is provided on this After Visit Summary.  MyChart is used to connect with patients for Virtual Visits (Telemedicine).  Patients are able to view lab/test results, encounter notes, upcoming appointments, etc.  Non-urgent messages can be sent to your provider as well.   To learn more about what you can do with MyChart, go to [email or web address].    Any Other Special Instructions Will Be Listed Below  (If Applicable).

## 2021-10-23 NOTE — Addendum Note (Signed)
Encounter addended by: Scarlette Calico, RN on: 10/23/2021 11:15 AM  Actions taken: Order list changed, Diagnosis association updated

## 2021-10-23 NOTE — Progress Notes (Signed)
Electrophysiology Office Follow up Visit Note:    Date:  10/23/2021   ID:  Tara Bradshaw, DOB 28-Dec-1985, MRN [id number]  PCP:  Patient, No Pcp Per  [facility] Cardiologist:  None  [facility] Electrophysiologist:  Vickie Epley, MD    Interval History:    Tara Bradshaw is a 36 y.o. female who presents for a follow up visit. They were last seen in clinic May 05, 2021 with Renee.  She has been doing well.  She follows with Dr. Haroldine Laws in the heart failure clinic.  She tells me that she is been having intermittent dizzy spells that are unpredictable and without clear trigger.      Past Medical History:  Diagnosis Date   Anemia    CHRONIC   CHF (congestive heart failure) (HCC)    Chronic systolic dysfunction of left ventricle    GERD (gastroesophageal reflux disease)    WITH PREGNANCY   Nonischemic cardiomyopathy (HCC)    Pneumonia    x 1   PVC's (premature ventricular contractions)     Past Surgical History:  Procedure Laterality Date   DILATION AND EVACUATION N/A 11/30/2016   Procedure: DILATATION AND EVACUATION;  Surgeon: Donnamae Jude, MD;  Location: [facility];  Service: Gynecology;  Laterality: N/A;   ICD IMPLANT N/A 02/19/2019   Procedure: ICD IMPLANT;  Surgeon: Thompson Grayer, MD;  Location: [facility];  Service: Cardiovascular;  Laterality: N/A;   LEAD REVISION/REPAIR N/A 04/16/2019   Procedure: LEAD REVISION/REPAIR;  Surgeon: Thompson Grayer, MD;  Location: [facility];  Service: Cardiovascular;  Laterality: N/A;   RIGHT/LEFT HEART CATH AND CORONARY ANGIOGRAPHY N/A 01/22/2019   Procedure: RIGHT/LEFT HEART CATH AND CORONARY ANGIOGRAPHY;  Surgeon: Jolaine Artist, MD;  Location: [facility];  Service: Cardiovascular;  Laterality: N/A;   WISDOM TOOTH EXTRACTION     NO ANESTHESIA    Current Medications: Current Meds  Medication Sig   acetaminophen (TYLENOL) 325 MG tablet Take 650 mg by mouth every 6 (six) hours as needed for mild  pain or headache.   amLODipine (NORVASC) 10 MG tablet Take 1 tablet (10 mg total) by mouth daily.   cyclobenzaprine (FLEXERIL) 10 MG tablet Take 1 tablet (10 mg total) by mouth 3 (three) times daily as needed for muscle spasms.   ENTRESTO 97-103 MG TAKE 1 TABLET BY MOUTH TWICE A DAY   FARXIGA 10 MG TABS tablet TAKE 1 TABLET BY MOUTH DAILY BEFORE BREAKFAST.   furosemide (LASIX) 40 MG tablet Take 40 mg by mouth 2 (two) times daily.   levonorgestrel (MIRENA) 20 MCG/24HR IUD 1 each by Intrauterine route once.   meclizine (ANTIVERT) 25 MG tablet Take 1 tablet (25 mg total) by mouth 3 (three) times daily as needed for dizziness.   potassium chloride SA (KLOR-CON M) 20 MEQ tablet Take 1 tablet (20 mEq total) by mouth daily.   Semaglutide, 1 MG/DOSE, (OZEMPIC, 1 MG/DOSE,) 4 MG/3ML SOPN Inject 1 mg into the skin once a week.   spironolactone (ALDACTONE) 25 MG tablet TAKE 1 TABLET BY MOUTH EVERY DAY   [DISCONTINUED] amiodarone (PACERONE) 200 MG tablet Take 0.5 tablets (100 mg total) by mouth daily.     Allergies:   Patient has no known allergies.   Social History   Socioeconomic History   Marital status: Legally Separated    Spouse name: Not on file   Number of children: Not on file   Years of education:  14   Highest education  level: Not on file  Occupational History   Occupation: SECURITY OFFICER    Employer: [employer]  Tobacco Use   Smoking status: Never   Smokeless tobacco: Never  Vaping Use   Vaping Use: Never used  Substance and Sexual Activity   Alcohol use: No   Drug use: No   Sexual activity: Yes    Partners: Male    Birth control/protection: None  Other Topics Concern   Not on file  Social History Narrative   Lives in [place] with husband and children (6,1)   Unemployed    Previously worked in [place].   Social Determinants of Health   Financial Resource Strain: Low Risk  (12/05/2017)   Overall Financial Resource Strain (CARDIA)    Difficulty of  Paying Living Expenses: Not hard at all  Food Insecurity: No Food Insecurity (12/05/2017)   Hunger Vital Sign    Worried About Running Out of Food in the Last Year: Never true    Ran Out of Food in the Last Year: Never true  Transportation Needs: No Transportation Needs (12/05/2017)   PRAPARE - Hydrologist (Medical): No    Lack of Transportation (Non-Medical): No  Physical Activity: Inactive (12/05/2017)   Exercise Vital Sign    Days of Exercise per Week: 0 days    Minutes of Exercise per Session: 0 min  Stress: Not on file  Social Connections: Not on file     Family History: The patient's family history includes Asthma in her mother; Cancer in her paternal grandmother; Cancer (age of onset: 60) in her maternal aunt; Epilepsy in her mother; Hypertension in her maternal grandmother.  ROS:   Please see the history of present illness.    All other systems reviewed and are negative.  EKGs/Labs/Other Studies Reviewed:    The following studies were reviewed today:  October 09, 2021 echo personally reviewed shows a EF of 25 to 30% with global hypokinesis.  No significant valvular abnormalities.  March 17, 2020 echo shows severely reduced left ventricular function, EF 25 to 30% with global hypokinesis.  October 23, 2021 in clinic device interrogation personally reviewed Battery longevity 7.7 years Less than 1% ventricular pacing No high-voltage therapies delivered NSVT episode June 24 appears to be atrially driven September 08, 6312 NSVT appears to be atrially driven as well June 05, 2021 NSVT episode lasting 10 beats.  No therapies delivered Lead parameters stable No changes made today  Recent Labs: 05/05/2021: ALT 14; BUN 9; Creatinine, Ser 0.69; Potassium 4.4; Sodium 138; TSH 0.492  Recent Lipid Panel    Component Value Date/Time   CHOL 212 (H) 06/04/2007 2012   TRIG 88 06/04/2007 2012   HDL 46 06/04/2007 2012   CHOLHDL 4.6 Ratio 06/04/2007 2012   VLDL  18 06/04/2007 2012   LDLCALC 148 (H) 06/04/2007 2012    Physical Exam:    VS:  BP 128/82   Pulse 75   Ht '5\' 2"'$  (1.575 m)   Wt 265 lb 3.2 oz (120.3 kg)   BMI 48.51 kg/m     Wt Readings from Last 3 Encounters:  10/23/21 265 lb 3.2 oz (120.3 kg)  06/19/21 277 lb (125.6 kg)  06/09/21 275 lb 9.6 oz (125 kg)     GEN:  Well nourished, well developed in no acute distress.  Obese HEENT: Normal NECK: No JVD; No carotid bruits LYMPHATICS: No lymphadenopathy CARDIAC: RRR, no murmurs, rubs, gallops.  ICD pocket well-healed RESPIRATORY:  Clear to  auscultation without rales, wheezing or rhonchi  ABDOMEN: Soft, non-tender, non-distended MUSCULOSKELETAL:  No edema; No deformity  SKIN: Warm and dry NEUROLOGIC:  Alert and oriented x 3 PSYCHIATRIC:  Normal affect        ASSESSMENT:    1. Chronic systolic heart failure ([place])   2. Nonischemic cardiomyopathy ([place])   3. Implantable cardioverter-defibrillator (ICD) in situ    PLAN:    In order of problems listed above:  #Chronic systolic heart failure NYHA class II-III.  Warm and dry on exam today.  ICD functioning appropriately.  #PVCs #Amiodarone use No change in ejection fraction with PVC suppression on amiodarone.  Given her young age, I favor discontinuing the amiodarone at this point with continued heart rhythm monitoring via the defibrillator.  We discussed how this will help avoid the long-term, cumulative, off target effects associated with amiodarone.  Follow-up with [place] in 1 year or sooner as needed.  APP appointment okay.    Medication Adjustments/Labs and Tests Ordered: Current medicines are reviewed at length with the patient today.  Concerns regarding medicines are outlined above.  No orders of the defined types were placed in this encounter.  No orders of the defined types were placed in this encounter.    Signed, Lars Mage, MD, [facility], [facility] 10/23/2021 8:28 AM    Electrophysiology [facility]  [facility]

## 2021-10-23 NOTE — Progress Notes (Signed)
Advanced Heart Failure Clinic Note   Date:  10/23/2021   ID:  Tara Bradshaw, DOB 06-02-1985, MRN [id number]  Location: Home  Provider location: [place] Type of Visit: Established patient  PCP:  Patient, No Pcp Per  Cardiologist:  None Primary HF: Dr. Haroldine Laws  Chief Complaint: f/u for Chronic Systolic Heart Failure    History of Present Illness:  Tara Bradshaw is a 36 y/o woman with h/o mild HTN who was admitted in 3/20 with acute systolic HF with EF 92-42% in setting of recent viral illness and 41-monthpost-partum status. COVID testing negative.   ZioPatch 8/20 NSR. Occasional PVCs (3.5%) 9-beat run NSVT   Had return visit w/ Dr. BHaroldine Laws10/8/20. Endorsed fatigue after starting carvedilol.   R/LHC on 01/22/19 which showed severe NICM EF 20%, Normal coronaries, Well compensated hemodynamics with high cardiac output.   CPX 12/21 FVC 2.68 (93%)      FEV1 2.27 (93%)        FEV1/FVC 85 (100%)        MVV 84 (84%)    Resting HR: 70 Standing HR: 70 Peak HR: 135   (73% age predicted max HR)   BP rest: 148/94 Standing BP: 126/92 BP peak: 166/84  Peak VO2: 14.9 (76% predicted peak VO2) When adjusted to the patient's ideal body weight of 127.7 lb (57.9 kg) the peak VO2 is 29.4 ml/kg (ibw)/min (91% of the ibw-adjusted predicted).  VE/VCO2 slope:  27  Peak RER: 0.95   She presents back to clinic today for f/u. Off Bidil due to HAs and dizziness. Overall stable. SOB with mild activity but able to do ADLs without too much trouble. No CP. Occasional LE edema. No orthopnea or PND. No problems with meds.  Echo 10/09/21 EF 25-30% RV is normal  Personally reviewed   Echo 10/20: EF 25-30% (read as 30-35%) Echo 12/21: EF 25-30%    Past Medical History:  Diagnosis Date   Anemia    CHRONIC   CHF (congestive heart failure) (HCC)    Chronic systolic dysfunction of left ventricle    GERD (gastroesophageal reflux disease)    WITH PREGNANCY   Nonischemic  cardiomyopathy (HCC)    Pneumonia    x 1   PVC's (premature ventricular contractions)    Past Surgical History:  Procedure Laterality Date   DILATION AND EVACUATION N/A 11/30/2016   Procedure: DILATATION AND EVACUATION;  Surgeon: PDonnamae Jude MD;  [facility];  Service: Gynecology;  Laterality: N/A;   ICD IMPLANT N/A 02/19/2019   Procedure: ICD IMPLANT;  Surgeon: AThompson Grayer MD;  [facility];  Service: Cardiovascular;  Laterality: N/A;   LEAD REVISION/REPAIR N/A 04/16/2019   Procedure: LEAD REVISION/REPAIR;  Surgeon: AThompson Grayer MD;  [facility];  Service: Cardiovascular;  Laterality: N/A;   RIGHT/LEFT HEART CATH AND CORONARY ANGIOGRAPHY N/A 01/22/2019   Procedure: RIGHT/LEFT HEART CATH AND CORONARY ANGIOGRAPHY;  Surgeon: BJolaine Artist MD;  [facility];  Service: Cardiovascular;  Laterality: N/A;   WISDOM TOOTH EXTRACTION     NO ANESTHESIA     Current Outpatient Medications  Medication Sig Dispense Refill   acetaminophen (TYLENOL) 325 MG tablet Take 650 mg by mouth every 6 (six) hours as needed for mild pain or headache.     amLODipine (NORVASC) 10 MG tablet Take 1 tablet (10 mg total) by mouth daily. 30 tablet 5   cyclobenzaprine (FLEXERIL) 10 MG tablet  Take 1 tablet (10 mg total) by mouth 3 (three) times daily as needed for muscle spasms. 10 tablet 0   ENTRESTO 97-103 MG TAKE 1 TABLET BY MOUTH TWICE A DAY 60 tablet 6   FARXIGA 10 MG TABS tablet TAKE 1 TABLET BY MOUTH DAILY BEFORE BREAKFAST. 30 tablet 3   furosemide (LASIX) 40 MG tablet Take 40 mg by mouth 2 (two) times daily.     levonorgestrel (MIRENA) 20 MCG/24HR IUD 1 each by Intrauterine route once.     meclizine (ANTIVERT) 25 MG tablet Take 1 tablet (25 mg total) by mouth 3 (three) times daily as needed for dizziness. 15 tablet 0   potassium chloride SA (KLOR-CON M) 20 MEQ tablet Take 1 tablet (20 mEq total) by mouth daily. 90 tablet 3   Semaglutide, 1 MG/DOSE,  (OZEMPIC, 1 MG/DOSE,) 4 MG/3ML SOPN Inject 1 mg into the skin once a week. 3 mL 11   spironolactone (ALDACTONE) 25 MG tablet TAKE 1 TABLET BY MOUTH EVERY DAY 30 tablet 6   No current facility-administered medications for this encounter.    Allergies:   Patient has no known allergies.   Social History:  The patient  reports that she has never smoked. She has never used smokeless tobacco. She reports that she does not drink alcohol and does not use drugs.   Family History:  The patient's family history includes Asthma in her mother; Cancer in her paternal grandmother; Cancer (age of onset: 60) in her maternal aunt; Epilepsy in her mother; Hypertension in her maternal grandmother.   ROS:  Please see the history of present illness.   All other systems are personally reviewed and negative.   Vitals:   10/23/21 0954  BP: 124/82  Pulse: 77  SpO2: 100%   Wt Readings from Last 3 Encounters:  10/23/21 119.7 kg (263 lb 12.8 oz)  10/23/21 120.3 kg (265 lb 3.2 oz)  06/19/21 125.6 kg (277 lb)    PHYSICAL EXAM: General:  Well appearing. No resp difficulty HEENT: normal Neck: supple. no JVD. Carotids 2+ bilat; no bruits. No lymphadenopathy or thryomegaly appreciated. Cor: PMI nondisplaced. Regular rate & rhythm. No rubs, gallops or murmurs. Lungs: clear Abdomen: obese soft, nontender, nondistended. No hepatosplenomegaly. No bruits or masses. Good bowel sounds. Extremities: no cyanosis, clubbing, rash, tr edema Neuro: alert & orientedx3, cranial nerves grossly intact. moves all 4 extremities w/o difficulty. Affect pleasant   Recent Labs: 05/05/2021: ALT 14; BUN 9; Creatinine, Ser 0.69; Potassium 4.4; Sodium 138; TSH 0.492  Personally reviewed   Wt Readings from Last 3 Encounters:  10/23/21 119.7 kg (263 lb 12.8 oz)  10/23/21 120.3 kg (265 lb 3.2 oz)  06/19/21 125.6 kg (277 lb)    ICD interrogated personally:  No AF/VT Volume ok. Personally reviewed    ASSESSMENT AND PLAN:  1.  Chronic systolic HF - Diagnosed 3/84. Echo LVEF 25% with moderate RV dysfunction - NICM. Either viral (had URI in week prior to admission and respiratory panel + rhinovirus) or post-partum or PVC cardiomyopathy. [place] 10/20 showed normal cors. RHC showed well compensated hemodynamics with high cardiac output. No evidence of intracardiac shunting.  - Echo 10/20 EF 25-30% - Echo 12/21 EF 25-30% - Echo 10/09/21 EF 25-30% Personally reviewed - CPX 1/22 Peak VO2: 14.9 (76% predicted peak VO2) adjusted to ibw pVO2 is 29.4 ml/kg (ibw)/min (91% of the ibw-adjusted predicted). VE/VCO2 slope:  27  - Stable NYHA II-III - Volume status ok . Continue Lasix 40 daily. Can take extra  as needed - Farxiga 10 - Continue entresto to 97/103. On contraception (Mirena) - Continue spiro 25 daily - Didn't tolerate carvedilol. Will try Toprol 25 qhs  - Failed Bidil due to HAs - Not candidate for cMRI due to size - Labs today - ICD interrogated in clinic today. Volume ok. No VT/VF Personally reviewed - Suspect she will need advanced therapies down the road but ok for now. Will screen for BaroStim   2. OSA - Had WatchPat study 07/30/18 has moderate OSA (AHI 15.3). - She is compliant  3. PVCs/Palpitations - In hospital was averaging about 8-10 PVCs per minute. May be contributing to LV dysfunction - ZioPatch 9/20. NSR one 9-beat run NSVT. PVC 3.5% - Amio stopped today by EP (I agree) - EF not improved with PVC suppression   4. HTN - Blood pressure well controlled. Continue current regimen.  5. Morbid obesity - Losing weight with Ozempic    Signed, Glori Bickers, MD  10/23/2021 10:34 AM  Advanced Heart Failure [place] [address] [phone] (office) [phone] (fax)

## 2021-10-23 NOTE — Patient Instructions (Addendum)
Medication Changes:  START Metoprolol XL 25 mg Daily at bedtime  Lab Work:  Labs done today, your results will be available in MyChart, we will contact you for abnormal readings.  Testing/Procedures:  none  Referrals:  You have been referred to Dr Tr[facility] Slade [facility], they will call you to schedule this   Special Instructions // Education:  Do the following things EVERYDAY: Weigh yourself in the morning before breakfast. Write it down and keep it in a log. Take your medicines as prescribed Eat low salt foods--Limit salt (sodium) to 2000 mg per day.  Stay as active as you can everyday Limit all fluids for the day to less than 2 liters  Follow-Up in: 6 months, **PLEASE CALL OUR OFFICE IN NOVEMBER TO SCHEDULE THIS APPOINTMENT  At the Advanced Heart Failure Clinic, you and your health needs are our priority. We have a designated team specialized in the treatment of Heart Failure. This Care Team includes your primary Heart Failure Specialized Cardiologist (physician), Advanced Practice Providers (APPs- Physician Assistants and Nurse Practitioners), and Pharmacist who all work together to provide you with the care you need, when you need it.   You may see any of the following providers on your designated Care Team at your next follow up:  Dr Glori Bickers Dr Haynes Kerns, NP Lyda Jester, [place] [facility] [place], [place] Audry Riles, PharmD   Please be sure to bring in all your medications bottles to every appointment.   Need to Contact us:  If you have any questions or concerns before your next appointment please send [place] a message through [place] or call our office at [phone].    TO LEAVE A MESSAGE FOR THE NURSE SELECT OPTION 2, PLEASE LEAVE A MESSAGE INCLUDING: YOUR NAME DATE OF BIRTH CALL BACK NUMBER REASON FOR CALL**this is important as we prioritize the call backs  YOU WILL RECEIVE A CALL BACK THE SAME DAY AS LONG AS YOU CALL  BEFORE 4:00 PM

## 2021-10-23 NOTE — Addendum Note (Signed)
Encounter addended by: Scarlette Calico, RN on: 10/23/2021 11:12 AM  Actions taken: Pend clinical note, Order list changed, Diagnosis association updated, Clinical Note Signed

## 2021-10-26 NOTE — Telephone Encounter (Signed)
Called pt to see how she was doing on Ozempic. No answer and unable to leave VM. Will try again later.

## 2021-10-30 ENCOUNTER — Telehealth: Payer: Self-pay | Admitting: Cardiology

## 2021-10-30 MED ORDER — OZEMPIC (1 MG/DOSE) 4 MG/3ML ~~LOC~~ SOPN: 1.0000 mg | PEN_INJECTOR | SUBCUTANEOUS | 11 refills | Status: DC

## 2021-10-30 NOTE — Telephone Encounter (Signed)
*  STAT* If patient is at the pharmacy, call can be transferred to refill team.   1. Which medications need to be refilled? (please list name of each medication and dose if known)   Semaglutide, 1 MG/DOSE, (OZEMPIC, 1 MG/DOSE,) 4 MG/3ML SOPN    2. Which pharmacy/location (including street and city if local pharmacy) is medication to be sent to? Inject 1 mg into the skin once a week.  3. Do they need a 30 day or 90 day supply?  30 day

## 2021-10-30 NOTE — Telephone Encounter (Signed)
Previous rx has plenty of refills. I tried to call pt but she did not answer. Tried to call her last week as well to follow up with her. Will send rx in.

## 2021-11-06 ENCOUNTER — Telehealth (HOSPITAL_COMMUNITY): Payer: Self-pay | Admitting: *Deleted

## 2021-11-06 NOTE — Telephone Encounter (Signed)
Pt left vm c/o nausea, fatigue, and bad headache since starting metoprolol. Pt asked if she can stop medication.

## 2021-11-06 NOTE — Progress Notes (Signed)
No ICM remote transmission received for 11/06/2021 and next ICM transmission scheduled for 11/14/2021.

## 2021-11-07 NOTE — Telephone Encounter (Signed)
Called pt no answer. Will try again later.

## 2021-11-10 DIAGNOSIS — G4733 Obstructive sleep apnea (adult) (pediatric): Secondary | ICD-10-CM | POA: Diagnosis not present

## 2021-11-14 ENCOUNTER — Ambulatory Visit (INDEPENDENT_AMBULATORY_CARE_PROVIDER_SITE_OTHER): Payer: Medicare Other

## 2021-11-14 DIAGNOSIS — Z95810 Presence of automatic (implantable) cardiac defibrillator: Secondary | ICD-10-CM

## 2021-11-14 DIAGNOSIS — I5022 Chronic systolic (congestive) heart failure: Secondary | ICD-10-CM | POA: Diagnosis not present

## 2021-11-15 NOTE — Progress Notes (Signed)
EPIC Encounter for ICM Monitoring  Patient Name: Tara Bradshaw is a 36 y.o. female Date: 11/15/2021 Primary Care Physican: Patient, No Pcp Per Primary Cardiologist: [place] Electrophysiologist: [place] 10/23/2021 Office Weight: 263 lbs                    Spoke with patient and heart failure questions reviewed.  Pt asymptomatic for fluid accumulation.  Reports feeling well at this time and voices no complaints.  She has been drinking too much fluid at times and discussed how much to drink to stay hydrated but not overloaded.      Corvue thoracic impedance suggesting possible fluid accumulation starting 8/6 and also 7/31-8/4.   Prescribed: Furosemide 40 mg take 1 tablet by mouth twice a day. Potassium 20 mEq take 1 tablet daily.    Labs: 10/23/2021 Creatinine 0.53, BUN 6,   Potassium 3.9, Sodium 136, GFR >60 05/05/2021 Creatinine 0.69, BUN 9,   Potassium 4.4, Sodium 138, GFR 115 A complete set of results can be found in Results Review.   Recommendations:  Recommendation to limit salt intake to 2000 mg daily and fluid intake to 64 oz daily.  Encouraged to call if experiencing any fluid symptoms.     Follow-up plan: ICM clinic phone appointment on 11/21/2021 to recheck fluid levels.   91 day device clinic remote transmission 12/01/2021.       EP/Cardiology Office Visits: Recall 04/21/2022 with Dr Haroldine Laws.   Recall 10/18/2022 with EP APP.   Copy of ICM check sent to Dr. Rayann Heman.     3 month ICM trend: 11/13/2021.    12-14 Month ICM trend:     [name], RN 11/15/2021 4:56 PM

## 2021-11-21 ENCOUNTER — Ambulatory Visit (INDEPENDENT_AMBULATORY_CARE_PROVIDER_SITE_OTHER): Payer: Medicare Other

## 2021-11-21 DIAGNOSIS — I5022 Chronic systolic (congestive) heart failure: Secondary | ICD-10-CM

## 2021-11-21 DIAGNOSIS — Z95810 Presence of automatic (implantable) cardiac defibrillator: Secondary | ICD-10-CM

## 2021-11-21 NOTE — Progress Notes (Unsigned)
EPIC Encounter for ICM Monitoring  Patient Name: Tara Bradshaw is a 36 y.o. female Date: 11/21/2021 Primary Care Physican: Patient, No Pcp Per Primary Cardiologist: [place] Electrophysiologist: Allred 10/23/2021 Office Weight: 263 lbs                    Spoke with patient and heart failure questions reviewed.  Pt asymptomatic for fluid accumulation.  Reports feeling well at this time and voices no complaints.  She has been limiting fluid intake as recommended and following low salt diet.   Corvue thoracic impedance suggesting possible ongoing fluid accumulation starting 8/6.   Prescribed: Furosemide 40 mg take 1 tablet by mouth twice a day. Potassium 20 mEq take 1 tablet daily.    Labs: 10/23/2021 Creatinine 0.53, BUN 6,   Potassium 3.9, Sodium 136, GFR >60 05/05/2021 Creatinine 0.69, BUN 9,   Potassium 4.4, Sodium 138, GFR 115 A complete set of results can be found in Results Review.   Recommendations: Advised to take extra 40 mg Furosemide daily x 2 days with extra Potassium. Recommendation to limit salt intake to 2000 mg daily and fluid intake to 64 oz daily.    Follow-up plan: ICM clinic phone appointment on 11/27/2021 to recheck fluid levels.   91 day device clinic remote transmission 12/01/2021.       EP/Cardiology Office Visits: Recall 04/21/2022 with Dr Haroldine Laws.   Recall 10/18/2022 with EP APP.   Copy of ICM check sent to Dr. Rayann Heman and Dr Haroldine Laws for review.      3 month ICM trend: 11/21/2021.    12-14 Month ICM trend:   ***  [name], RN 11/21/2021 3:23 PM

## 2021-11-22 NOTE — Progress Notes (Signed)
Received: Today Bensimhon, Shaune Pascal, MD  Annalynn Centanni Panda, RN Agree

## 2021-11-23 ENCOUNTER — Other Ambulatory Visit: Payer: Self-pay | Admitting: *Deleted

## 2021-11-23 DIAGNOSIS — Z01818 Encounter for other preprocedural examination: Secondary | ICD-10-CM

## 2021-11-27 ENCOUNTER — Ambulatory Visit (INDEPENDENT_AMBULATORY_CARE_PROVIDER_SITE_OTHER): Payer: Medicare Other

## 2021-11-28 ENCOUNTER — Telehealth: Payer: Self-pay

## 2021-11-28 NOTE — Telephone Encounter (Signed)
Remote ICM transmission received.  Attempted call to patient regarding ICM remote transmission and no voice mail box set up.

## 2021-11-28 NOTE — Progress Notes (Signed)
EPIC Encounter for ICM Monitoring  Patient Name: Tara Bradshaw is a 36 y.o. female Date: 11/28/2021 Primary Care Physican: Patient, No Pcp Per Primary Cardiologist: [place] Electrophysiologist: [place] 10/23/2021 Office Weight: 263 lbs                    Attempted call to patient and unable to reach.  Transmission reviewed.    Corvue thoracic impedance suggesting fluid levels returned to normal after taking extra Furosemide.   Prescribed: Furosemide 40 mg take 1 tablet by mouth twice a day. Potassium 20 mEq take 1 tablet daily.    Labs: 10/23/2021 Creatinine 0.53, BUN 6,   Potassium 3.9, Sodium 136, GFR >60 05/05/2021 Creatinine 0.69, BUN 9,   Potassium 4.4, Sodium 138, GFR 115 A complete set of results can be found in Results Review.   Recommendations: Unable to reach.     Follow-up plan: ICM clinic phone appointment on 12/18/2021.   91 day device clinic remote transmission 03/02/2022.       EP/Cardiology Office Visits: Recall 04/21/2022 with Dr Haroldine Laws.   Recall 10/18/2022 with EP APP.   Copy of ICM check sent to Dr. Rayann Heman.   3 month ICM trend: 11/27/2021.    12-14 Month ICM trend:     [name], RN 11/28/2021 8:13 AM

## 2021-12-01 ENCOUNTER — Ambulatory Visit (INDEPENDENT_AMBULATORY_CARE_PROVIDER_SITE_OTHER): Payer: Medicaid Other

## 2021-12-01 DIAGNOSIS — I5022 Chronic systolic (congestive) heart failure: Secondary | ICD-10-CM

## 2021-12-01 DIAGNOSIS — I428 Other cardiomyopathies: Secondary | ICD-10-CM

## 2021-12-01 LAB — CUP PACEART REMOTE DEVICE CHECK
Battery Remaining Longevity: 88 mo
Battery Remaining Percentage: 74 %
Battery Voltage: 2.98 V
Brady Statistic RV Percent Paced: 1 %
Date Time Interrogation Session: 20230825020122
HighPow Impedance: 68 Ohm
Implantable Lead Implant Date: 20210107
Implantable Lead Location: 753860
Implantable Pulse Generator Implant Date: 20201112
Lead Channel Impedance Value: 310 Ohm
Lead Channel Pacing Threshold Amplitude: 1.25 V
Lead Channel Pacing Threshold Pulse Width: 0.5 ms
Lead Channel Sensing Intrinsic Amplitude: 11.9 mV
Lead Channel Setting Pacing Amplitude: 2.5 V
Lead Channel Setting Pacing Pulse Width: 0.5 ms
Lead Channel Setting Sensing Sensitivity: 0.5 mV
Pulse Gen Serial Number: 111012702

## 2021-12-04 ENCOUNTER — Ambulatory Visit (INDEPENDENT_AMBULATORY_CARE_PROVIDER_SITE_OTHER): Payer: Medicare Other | Admitting: Surgery

## 2021-12-04 ENCOUNTER — Ambulatory Visit (HOSPITAL_COMMUNITY)
Admission: RE | Admit: 2021-12-04 | Discharge: 2021-12-04 | Disposition: A | Discharge status: Home / Self Care | Payer: Medicare Other | Source: Ambulatory Visit | Attending: Vascular Surgery | Admitting: Vascular Surgery

## 2021-12-04 ENCOUNTER — Encounter: Payer: Self-pay | Admitting: Surgery

## 2021-12-04 VITALS — BP 115/79 | HR 60 | Temp 98.0°F | Resp 20 | Ht 62.0 in | Wt 260.0 lb

## 2021-12-04 DIAGNOSIS — Z01818 Encounter for other preprocedural examination: Secondary | ICD-10-CM | POA: Insufficient documentation

## 2021-12-04 DIAGNOSIS — I5022 Chronic systolic (congestive) heart failure: Secondary | ICD-10-CM | POA: Diagnosis not present

## 2021-12-04 NOTE — Progress Notes (Signed)
Vascular and Vein Specialist of [facility]  Patient name: Tara Bradshaw MRN: [id number] DOB: April 03, 1986 Sex: female   REQUESTING PROVIDER:    Dr. Haroldine Laws   REASON FOR CONSULT:    Barostim evaluation  HISTORY OF PRESENT ILLNESS:   Tara Bradshaw is a 36 y.o. female, who is referred for evaluation of a Barostim implant.  She suffers from nonischemic cardiomyopathy with normal coronaries.  She was hospitalized in 2020 with an ejection fraction of 20% following a viral illness, 7 months postpartum.  She was COVID-negative.  Her most recent echo on 10/09/2021 shows an ejection fraction of 25 to 30%.  She is on goal-directed medical therapy and is considered NYHA II-III she does have a ICD in place.  She suffers from morbid obesity and is on Ozempic  PAST MEDICAL HISTORY    Past Medical History:  Diagnosis Date   Anemia    CHRONIC   CHF (congestive heart failure) (HCC)    Chronic systolic dysfunction of left ventricle    GERD (gastroesophageal reflux disease)    WITH PREGNANCY   Nonischemic cardiomyopathy (HCC)    Pneumonia    x 1   PVC's (premature ventricular contractions)      FAMILY HISTORY   Family History  Problem Relation Age of Onset   Epilepsy Mother    Asthma Mother    Cancer Paternal Grandmother        BREAST   Cancer Maternal Aunt 57       BREAST   Hypertension Maternal Grandmother     SOCIAL HISTORY:   Social History   Socioeconomic History   Marital status: Legally Separated    Spouse name: Not on file   Number of children: Not on file   Years of education:  14   Highest education level: Not on file  Occupational History   Occupation: SECURITY OFFICER    Employer: [employer]  Tobacco Use   Smoking status: Never   Smokeless tobacco: Never  Vaping Use   Vaping Use: Never used  Substance and Sexual Activity   Alcohol use: No   Drug use: No   Sexual activity: Yes    Partners: Male     Birth control/protection: None  Other Topics Concern   Not on file  Social History Narrative   Lives in [place] with husband and children (6,1)   Unemployed    Previously worked in [employer].   Social Determinants of Health   Financial Resource Strain: Low Risk  (12/05/2017)   Overall Financial Resource Strain (CARDIA)    Difficulty of Paying Living Expenses: Not hard at all  Food Insecurity: No Food Insecurity (12/05/2017)   Hunger Vital Sign    Worried About Running Out of Food in the Last Year: Never true    Ran Out of Food in the Last Year: Never true  Transportation Needs: No Transportation Needs (12/05/2017)   PRAPARE - Hydrologist (Medical): No    Lack of Transportation (Non-Medical): No  Physical Activity: Inactive (12/05/2017)   Exercise Vital Sign    Days of Exercise per Week: 0 days    Minutes of Exercise per Session: 0 min  Stress: Not on file  Social Connections: Not on file  Intimate Partner Violence: Not At Risk (12/05/2017)   Humiliation, Afraid, Rape, and Kick questionnaire    Fear of Current or Ex-Partner: No    Emotionally Abused: No    Physically Abused: No    Sexually Abused:  No    ALLERGIES:    No Known Allergies  CURRENT MEDICATIONS:    Current Outpatient Medications  Medication Sig Dispense Refill   acetaminophen (TYLENOL) 325 MG tablet Take 650 mg by mouth every 6 (six) hours as needed for mild pain or headache.     amLODipine (NORVASC) 10 MG tablet Take 1 tablet (10 mg total) by mouth daily. 90 tablet 3   cyclobenzaprine (FLEXERIL) 10 MG tablet Take 1 tablet (10 mg total) by mouth 3 (three) times daily as needed for muscle spasms. 10 tablet 0   dapagliflozin propanediol (FARXIGA) 10 MG TABS tablet Take 1 tablet (10 mg total) by mouth daily before breakfast. 90 tablet 3   furosemide (LASIX) 40 MG tablet Take 1 tablet (40 mg total) by mouth 2 (two) times daily. 180 tablet 3   levonorgestrel (MIRENA) 20 MCG/24HR IUD 1  each by Intrauterine route once.     meclizine (ANTIVERT) 25 MG tablet Take 1 tablet (25 mg total) by mouth 3 (three) times daily as needed for dizziness. 15 tablet 0   metoprolol succinate (TOPROL-XL) 25 MG 24 hr tablet Take 1 tablet (25 mg total) by mouth at bedtime. 30 tablet 6   potassium chloride SA (KLOR-CON M) 20 MEQ tablet Take 1 tablet (20 mEq total) by mouth daily. 90 tablet 3   sacubitril-valsartan (ENTRESTO) 97-103 MG Take 1 tablet by mouth 2 (two) times daily. 180 tablet 3   Semaglutide, 1 MG/DOSE, (OZEMPIC, 1 MG/DOSE,) 4 MG/3ML SOPN Inject 1 mg into the skin once a week. 3 mL 11   spironolactone (ALDACTONE) 25 MG tablet Take 1 tablet (25 mg total) by mouth daily. 90 tablet 3   No current facility-administered medications for this visit.    REVIEW OF SYSTEMS:   '[X]'$  denotes positive finding, '[ ]'$  denotes negative finding Cardiac  Comments:  Chest pain or chest pressure:    Shortness of breath upon exertion:    Short of breath when lying flat:    Irregular heart rhythm:        Vascular    Pain in calf, thigh, or hip brought on by ambulation:    Pain in feet at night that wakes you up from your sleep:     Blood clot in your veins:    Leg swelling:         Pulmonary    Oxygen at home:    Productive cough:     Wheezing:         Neurologic    Sudden weakness in arms or legs:     Sudden numbness in arms or legs:     Sudden onset of difficulty speaking or slurred speech:    Temporary loss of vision in one eye:     Problems with dizziness:         Gastrointestinal    Blood in stool:      Vomited blood:         Genitourinary    Burning when urinating:     Blood in urine:        Psychiatric    Major depression:         Hematologic    Bleeding problems:    Problems with blood clotting too easily:        Skin    Rashes or ulcers:        Constitutional    Fever or chills:     PHYSICAL EXAM:   Vitals:   12/04/21 0818  BP: 115/79  Pulse: 60  Resp: 20   Temp: 98 F (36.7 C)  SpO2: 98%  Weight: 260 lb (117.9 kg)  Height: '5\' 2"'$  (1.575 m)    GENERAL: The patient is a well-nourished female, in no acute distress. The vital signs are documented above. CARDIAC: There is a regular rate and rhythm.  VASCULAR: Carotid was evaluated with SonoSite.  Her bifurcation is a little on the high side PULMONARY: Nonlabored respirations MUSCULOSKELETAL: There are no major deformities or cyanosis. NEUROLOGIC: No focal weakness or paresthesias are detected. SKIN: There are no ulcers or rashes noted. PSYCHIATRIC: The patient has a normal affect.  STUDIES:   I have reviewed her carotid ultrasound with the following findings: Right Carotid: There is no evidence of stenosis in the right ICA.   Left Carotid: There is no evidence of stenosis in the left ICA.   Vertebrals:  Bilateral vertebral arteries demonstrate antegrade flow.  Subclavians: Normal flow hemodynamics were seen in bilateral subclavian               arteries. ASSESSMENT and PLAN   NYHA II-III: I discussed that she is a good patient for Barostim therapy.  I discussed the details of the procedure including the risks and benefits.  We discussed the risk of infection.  This will be placed on the right side.  She has a [age] at the end of September and would like to wait until after that before having this placed.  In the meantime we will work on Biochemist, clinical.   Annamarie Major, Dorothy Puffer, MD, FACS Vascular and Vein Specialists of [facility] [phone] Pager [phone]

## 2021-12-13 ENCOUNTER — Other Ambulatory Visit: Payer: Self-pay

## 2021-12-13 DIAGNOSIS — I5022 Chronic systolic (congestive) heart failure: Secondary | ICD-10-CM

## 2021-12-18 ENCOUNTER — Ambulatory Visit (INDEPENDENT_AMBULATORY_CARE_PROVIDER_SITE_OTHER): Payer: Medicare Other

## 2021-12-18 DIAGNOSIS — Z95810 Presence of automatic (implantable) cardiac defibrillator: Secondary | ICD-10-CM | POA: Diagnosis not present

## 2021-12-18 DIAGNOSIS — I5022 Chronic systolic (congestive) heart failure: Secondary | ICD-10-CM | POA: Diagnosis not present

## 2021-12-18 NOTE — Progress Notes (Unsigned)
EPIC Encounter for ICM Monitoring  Patient Name: Tara Bradshaw is a 36 y.o. female Date: 12/18/2021 Primary Care Physican: Patient, No Pcp Per Primary Cardiologist: [place] Electrophysiologist: Quentin Ore 10/23/2021 Office Weight: 263 lbs                    Attempted call to patient and unable to reach.  Left message to return call. Transmission reviewed.     Corvue thoracic impedance suggesting possible fluid accumulation starting 9/4 and returning to baseline normal 9/11.  Impedance also suggesting possible fluid accumulation from 8/10-8/17   Prescribed: Furosemide 40 mg take 1 tablet by mouth twice a day. Potassium 20 mEq take 1 tablet daily.    Labs: 10/23/2021 Creatinine 0.53, BUN 6,   Potassium 3.9, Sodium 136, GFR >60 05/05/2021 Creatinine 0.69, BUN 9,   Potassium 4.4, Sodium 138, GFR 115 A complete set of results can be found in Results Review.   Recommendations: Unable to reach.     Follow-up plan: ICM clinic phone appointment on 01/22/2022.   91 day device clinic remote transmission 03/05/2022.       EP/Cardiology Office Visits: Recall 04/21/2022 with Dr Haroldine Laws.   Recall 10/18/2022 with EP APP.   Copy of ICM check sent to Dr. Quentin Ore.  3 month ICM trend: 12/18/2021.     12-14 Month ICM trend:     [name], RN 12/18/2021 7:38 AM

## 2021-12-19 ENCOUNTER — Telehealth: Payer: Self-pay | Admitting: Pharmacist

## 2021-12-19 ENCOUNTER — Telehealth: Payer: Self-pay

## 2021-12-19 NOTE — Telephone Encounter (Signed)
Remote ICM transmission received.  Attempted call to patient regarding ICM remote transmission and left message to return call   

## 2021-12-22 ENCOUNTER — Ambulatory Visit: Payer: Medicare Other | Attending: Cardiovascular Disease

## 2021-12-22 NOTE — Progress Notes (Deleted)
Patient ID: Tara Bradshaw                 DOB: 17-Sep-1985                    MRN: [id number]     HPI: Tara Bradshaw is a 36 y.o. female patient referred to pharmacy clinic by Dr. Haroldine Laws to initiate weight loss therapy with GLP1-RA. PMH is significant for obesity complicated by chronic medical conditions including CHF, HTN, NICM, PVC and mild OSA. Most recent BMI 52.34.   Patient has not tried much to loose weight. Admits that soda, bread and sweets are her down fall. Also eats a lot of fast food. She gets exhausted very easily. States she will sometimes eat breakfast, usually fast food. If she eats breakfast she typically doesn't eat lunch. If she doesn't eat breakfast then she eats a late lunch- usually a burger or chicken nuggets from fast food. Drops kids off a school and then goes home and does stuff around the house and then tends to lie down until 1:30 then picks up kids. Has to drop husband off at work at 4 then comes home and prepares dinner.   Current weight management medications: ozempic 15m weekly  Previously tried meds: none  Current meds that may affect weight: none  Baseline weight/BMI: 277lb/52.3  Insurance payor: medicare/medicaid  Diet:  -Breakfast:sometimes nothing, biscuit, eggs bacon -Lunch: fast food -Dinner: spaghetti, fried food, sauteed steak, salad, habachi at home, vegetables -Snacks: chips, "junk" -Drinks: soda (4-5 per day), water, juice milk Breads, sodas, sweets  Exercise: walked to the corner and back the other day, was SOB when she got to the corner  Family History: The patient's family history includes Asthma in her mother; Cancer in her paternal grandmother; Cancer (age of onset: 564 in her maternal aunt; Epilepsy in her mother; Hypertension in her maternal grandmother.   Social History: The patient  reports that she has never smoked. She has never used smokeless tobacco. She reports that she does not drink alcohol and does not use drugs.    Labs: Lab Results  Component Value Date   HGBA1C 5.6 07/03/2018    Wt Readings from Last 1 Encounters:  12/04/21 260 lb (117.9 kg)    BP Readings from Last 1 Encounters:  12/04/21 115/79   Pulse Readings from Last 1 Encounters:  12/04/21 60       Component Value Date/Time   CHOL 212 (H) 06/04/2007 2012   TRIG 88 06/04/2007 2012   HDL 46 06/04/2007 2012   CHOLHDL 4.6 Ratio 06/04/2007 2012   VLDL 18 06/04/2007 2012   LAntelope148 (H) 06/04/2007 2012    Past Medical History:  Diagnosis Date   Anemia    CHRONIC   CHF (congestive heart failure) (HCC)    Chronic systolic dysfunction of left ventricle    GERD (gastroesophageal reflux disease)    WITH PREGNANCY   Nonischemic cardiomyopathy (HCC)    Pneumonia    x 1   PVC's (premature ventricular contractions)     Current Outpatient Medications on File Prior to Visit  Medication Sig Dispense Refill   acetaminophen (TYLENOL) 325 MG tablet Take 650 mg by mouth every 6 (six) hours as needed for mild pain or headache.     amLODipine (NORVASC) 10 MG tablet Take 1 tablet (10 mg total) by mouth daily. 90 tablet 3   cyclobenzaprine (FLEXERIL) 10 MG tablet Take 1 tablet (10 mg total) by mouth 3 (three) times  daily as needed for muscle spasms. 10 tablet 0   dapagliflozin propanediol (FARXIGA) 10 MG TABS tablet Take 1 tablet (10 mg total) by mouth daily before breakfast. 90 tablet 3   furosemide (LASIX) 40 MG tablet Take 1 tablet (40 mg total) by mouth 2 (two) times daily. 180 tablet 3   levonorgestrel (MIRENA) 20 MCG/24HR IUD 1 each by Intrauterine route once.     meclizine (ANTIVERT) 25 MG tablet Take 1 tablet (25 mg total) by mouth 3 (three) times daily as needed for dizziness. 15 tablet 0   metoprolol succinate (TOPROL-XL) 25 MG 24 hr tablet Take 1 tablet (25 mg total) by mouth at bedtime. 30 tablet 6   potassium chloride SA (KLOR-CON M) 20 MEQ tablet Take 1 tablet (20 mEq total) by mouth daily. 90 tablet 3    sacubitril-valsartan (ENTRESTO) 97-103 MG Take 1 tablet by mouth 2 (two) times daily. 180 tablet 3   Semaglutide, 1 MG/DOSE, (OZEMPIC, 1 MG/DOSE,) 4 MG/3ML SOPN Inject 1 mg into the skin once a week. 3 mL 11   spironolactone (ALDACTONE) 25 MG tablet Take 1 tablet (25 mg total) by mouth daily. 90 tablet 3   No current facility-administered medications on file prior to visit.    No Known Allergies   Assessment/Plan:  1. Weight loss - Patient has not met goal of at least 5% of body weight loss with comprehensive lifestyle modifications alone in the past 3-6 months. Pharmacotherapy is appropriate to pursue as augmentation. Will start Ozempic. Confirmed patient not pregnant and no personal or family history of medullary thyroid carcinoma (MTC) or Multiple Endocrine Neoplasia syndrome type 2 (MEN 2). No hx of pancreatitis, gallstones or retinopathy.  Advised patient on common side effects including nausea, diarrhea, dyspepsia, decreased appetite, and fatigue. Counseled patient on reducing meal size and how to titrate medication to minimize side effects. Counseled patient to call if intolerable side effects or if experiencing dehydration, abdominal pain, or dizziness.   Patient consumes a large amount of soda, juice and processed foods. We talked about the risk of developing DM and fatty liver with a diet so high in fructose and highly processed foods. Talked about how there is no miracle drug and she will have to improve what she eats to keep the weight off and improve her health. We decided to make small changes at a time. Will start with cutting back on soda. Goal will be to reduce to 2 cans per day and no juice. Will set a new goal next month.  I encouraged her to continue to work on her exercise. Goal is to walk to corner and back twice per day. Patient is in agreement with both of these goals and thinks they are obtainable. Discussed how if she cannot walk 2 times in a row, it is ok to rest and try  walking again either after resting or later in the day. Continue to walk and increase as able. We all have to start somewhere. 5 min here and there are collective. Think of exercise as a daily pill to keep [place] healthy.  I have referred her to the [facility] prep class.  Injection technique reviewed at today's visit and patient successfully self-administered first dose of Ozempic 0.8m into the fatty tissue of the abdomen.  Titration Plan:  Will plan to follow the titration plan as below, pending patient is tolerating each dose before increasing to the next. Can slow titration if needed for tolerability.    -Month 1: Inject 0.225m  SQ once weekly x 4 weeks -Month 2: Inject 0.79m SQ once weekly x 4 weeks -Month 3: Inject 174mSQ once weekly x 4 weeks -Month 4+: Inject 90m41mQ once weekly   Follow up in 1 month via telephone.

## 2021-12-25 NOTE — Progress Notes (Signed)
Remote ICD transmission.   

## 2021-12-29 ENCOUNTER — Encounter: Payer: Self-pay | Admitting: Cardiology

## 2021-12-29 NOTE — Pre-Procedure Instructions (Signed)
Surgical Instructions    Your procedure is scheduled on Thursday, January 11, 2022 at 7:30 AM.  Report to [facility] Main Entrance "A" at 5:30 A.M., then check in with the Admitting office.  Call this number if you have problems the morning of surgery:  [phone]   If you have any questions prior to your surgery date call [phone]: Open Monday-Friday 8am-4pm    Remember:  Do not eat or drink after midnight the night before your surgery    Take these medicines the morning of surgery with A SIP OF WATER:  amLODipine (NORVASC)  IF NEEDED: acetaminophen (TYLENOL)  cyclobenzaprine (FLEXERIL)  meclizine (ANTIVERT)  As of today, STOP taking any Aspirin (unless otherwise instructed by your surgeon) Aleve, Naproxen, Ibuprofen, Motrin, Advil, Goody's, BC's, all herbal medications, fish oil, and all vitamins.   You will stop taking your dapagliflozin propanediol (FARXIGA) 3 days before your surgery. Your last dose should be on 01/07/22.  You will stop taking your Semaglutide, (OZEMPIC) 7 days before your surgery. Your last dose should be on 01/03/22.             Do NOT Smoke (Tobacco/Vaping) for 24 hours prior to your procedure.  If you use a CPAP at night, you may bring your mask/headgear for your overnight stay.   Contacts, glasses, piercing's, hearing aid's, dentures or partials may not be worn into surgery, please bring cases for these belongings.    For patients admitted to the hospital, discharge time will be determined by your treatment team.   Patients discharged the day of surgery will not be allowed to drive home, and someone needs to stay with them for 24 hours.  SURGICAL WAITING ROOM VISITATION Patients having surgery or a procedure may have two support people in the waiting area. Visitors may stay in the waiting area during the procedure and switch out with other visitors if needed. Children under the age of 50 must have an adult accompany them who is not the  patient. If the patient needs to stay at the hospital during part of their recovery, the visitor guidelines for inpatient rooms apply.  Please refer to the [facility] website for the visitor guidelines for Inpatients (after your surgery is over and you are in a regular room).    Special instructions:   - Preparing For Surgery  Before surgery, you can play an important role. Because skin is not sterile, your skin needs to be as free of germs as possible. You can reduce the number of germs on your skin by washing with CHG (chlorahexidine gluconate) Soap before surgery.  CHG is an antiseptic cleaner which kills germs and bonds with the skin to continue killing germs even after washing.    Oral Hygiene is also important to reduce your risk of infection.  Remember - BRUSH YOUR TEETH THE MORNING OF SURGERY WITH YOUR REGULAR TOOTHPASTE  Please do not use if you have an allergy to CHG or antibacterial soaps. If your skin becomes reddened/irritated stop using the CHG.  Do not shave (including legs and underarms) for at least 48 hours prior to first CHG shower. It is OK to shave your face.  Please follow these instructions carefully.   Shower the NIGHT BEFORE SURGERY and the MORNING OF SURGERY  If you chose to wash your hair, wash your hair first as usual with your normal shampoo.  After you shampoo, rinse your hair and body thoroughly to remove the shampoo.  Use CHG Soap as you  would any other liquid soap. You can apply CHG directly to the skin and wash gently with a scrungie or a clean washcloth.   Apply the CHG Soap to your body ONLY FROM THE NECK DOWN.  Do not use on open wounds or open sores. Avoid contact with your eyes, ears, mouth and genitals (private parts). Wash Face and genitals (private parts)  with your normal soap.   Wash thoroughly, paying special attention to the area where your surgery will be performed.  Thoroughly rinse your body with warm water from the neck  down.  DO NOT shower/wash with your normal soap after using and rinsing off the CHG Soap.  Pat yourself dry with a CLEAN TOWEL.  Wear CLEAN PAJAMAS to bed the night before surgery  Place CLEAN SHEETS on your bed the night before your surgery  DO NOT SLEEP WITH PETS.   Day of Surgery: Take a shower with CHG soap. Do not wear jewelry or makeup Do not wear lotions, powders, perfumes/colognes, or deodorant. Do not shave 48 hours prior to surgery. Do not bring valuables to the hospital.  [facility] is not responsible for any belongings or valuables. Do not wear nail polish, gel polish, artificial nails, or any other type of covering on natural nails (fingers and toes) If you have artificial nails or gel coating that need to be removed by a nail salon, please have this removed prior to surgery. Artificial nails or gel coating may interfere with anesthesia's ability to adequately monitor your vital signs. Wear Clean/Comfortable clothing the morning of surgery Do not apply any deodorants/lotions.   Remember to brush your teeth WITH YOUR REGULAR TOOTHPASTE.   Please read over the following fact sheets that you were given.  If you received a COVID test during your pre-op visit  it is requested that you wear a mask when out in public, stay away from anyone that may not be feeling well and notify your surgeon if you develop symptoms. If you have been in contact with anyone that has tested positive in the last 10 days please notify you surgeon.

## 2021-12-29 NOTE — Progress Notes (Addendum)
PERIOPERATIVE PRESCRIPTION FOR IMPLANTED CARDIAC DEVICE PROGRAMMING  Patient Information: Name:  Tara Bradshaw  DOB:  11/24/1985  MRN:  [id number]   Planned Procedure:  Insertion of right barostim  Surgeon:  Dr. Harold Barban  Date of Procedure:  01/11/22 @ 0730  Position during surgery:  supine   Please send documentation back to:  [facility] (Fax # [phone])   [phone] Device Information:  Clinic EP Physician:  Dr. Lars Mage   Device Type:  Defibrillator Manufacturer and Phone #:  [employer] Jude/[employer]ott: [phone] Pacemaker Dependent?:  No. Date of Last Device Check:  12/25/2021 Normal Device Function?:  Yes.    Electrophysiologist's Recommendations:  Have magnet available. Provide continuous ECG monitoring when magnet is used or reprogramming is to be performed.  Procedure will likely interfere with device function.  Device should be programmed:  Tachy therapies disabled  Per Device Clinic Standing Orders, Simone Curia, RN  1:37 PM 12/29/2021

## 2022-01-01 ENCOUNTER — Other Ambulatory Visit: Payer: Self-pay

## 2022-01-01 ENCOUNTER — Encounter (HOSPITAL_COMMUNITY): Payer: Self-pay

## 2022-01-01 ENCOUNTER — Encounter (HOSPITAL_COMMUNITY)
Admission: RE | Admit: 2022-01-01 | Discharge: 2022-01-01 | Disposition: A | Discharge status: Home / Self Care | Payer: Medicare Other | Source: Ambulatory Visit | Attending: Surgery | Admitting: Surgery

## 2022-01-01 VITALS — BP 129/84 | HR 78 | Temp 98.3°F | Resp 18 | Ht 61.0 in | Wt 259.4 lb

## 2022-01-01 DIAGNOSIS — I5022 Chronic systolic (congestive) heart failure: Secondary | ICD-10-CM | POA: Diagnosis not present

## 2022-01-01 DIAGNOSIS — I428 Other cardiomyopathies: Secondary | ICD-10-CM | POA: Diagnosis not present

## 2022-01-01 DIAGNOSIS — G4733 Obstructive sleep apnea (adult) (pediatric): Secondary | ICD-10-CM | POA: Insufficient documentation

## 2022-01-01 DIAGNOSIS — K219 Gastro-esophageal reflux disease without esophagitis: Secondary | ICD-10-CM | POA: Diagnosis not present

## 2022-01-01 DIAGNOSIS — I509 Heart failure, unspecified: Secondary | ICD-10-CM | POA: Diagnosis not present

## 2022-01-01 DIAGNOSIS — Z6841 Body Mass Index (BMI) 40.0 and over, adult: Secondary | ICD-10-CM | POA: Diagnosis not present

## 2022-01-01 DIAGNOSIS — D649 Anemia, unspecified: Secondary | ICD-10-CM | POA: Insufficient documentation

## 2022-01-01 DIAGNOSIS — I959 Hypotension, unspecified: Secondary | ICD-10-CM | POA: Insufficient documentation

## 2022-01-01 DIAGNOSIS — Z01818 Encounter for other preprocedural examination: Secondary | ICD-10-CM | POA: Diagnosis not present

## 2022-01-01 HISTORY — DX: Presence of automatic (implantable) cardiac defibrillator: Z95.810

## 2022-01-01 HISTORY — DX: Sleep apnea, unspecified: G47.30

## 2022-01-01 LAB — URINALYSIS, ROUTINE W REFLEX MICROSCOPIC
Bilirubin Urine: NEGATIVE
Glucose, UA: NEGATIVE mg/dL
Hgb urine dipstick: NEGATIVE
Ketones, ur: NEGATIVE mg/dL
Leukocytes,Ua: NEGATIVE
Nitrite: NEGATIVE
Protein, ur: NEGATIVE mg/dL
Specific Gravity, Urine: >1.03 — ABNORMAL HIGH (ref 1.005–1.030)
pH: 6 (ref 5.0–8.0)

## 2022-01-01 LAB — CBC
HCT: 35.9 % — ABNORMAL LOW (ref 36.0–46.0)
Hemoglobin: 11.6 g/dL — ABNORMAL LOW (ref 12.0–15.0)
MCH: 27.1 pg (ref 26.0–34.0)
MCHC: 32.3 g/dL (ref 30.0–36.0)
MCV: 83.9 fL (ref 80.0–100.0)
Platelets: 201 10*3/uL (ref 150–400)
RBC: 4.28 MIL/uL (ref 3.87–5.11)
RDW: 13.8 % (ref 11.5–15.5)
WBC: 7.8 10*3/uL (ref 4.0–10.5)
nRBC: 0 % (ref 0.0–0.2)

## 2022-01-01 LAB — PROTIME-INR
INR: 1 (ref 0.8–1.2)
Prothrombin Time: 13.1 seconds (ref 11.4–15.2)

## 2022-01-01 LAB — COMPREHENSIVE METABOLIC PANEL
ALT: 18 U/L (ref 0–44)
AST: 18 U/L (ref 15–41)
Albumin: 3.9 g/dL (ref 3.5–5.0)
Alkaline Phosphatase: 48 U/L (ref 38–126)
Anion gap: 5 (ref 5–15)
BUN: 8 mg/dL (ref 6–20)
CO2: 25 mmol/L (ref 22–32)
Calcium: 9.1 mg/dL (ref 8.9–10.3)
Chloride: 108 mmol/L (ref 98–111)
Creatinine, Ser: 0.76 mg/dL (ref 0.44–1.00)
GFR, Estimated: >60 mL/min (ref >60)
Glucose, Bld: 91 mg/dL (ref 70–99)
Potassium: 3.9 mmol/L (ref 3.5–5.1)
Sodium: 138 mmol/L (ref 135–145)
Total Bilirubin: 0.3 mg/dL (ref 0.3–1.2)
Total Protein: 6.8 g/dL (ref 6.5–8.1)

## 2022-01-01 LAB — TYPE AND SCREEN
ABO/RH(D): O POS
Antibody Screen: NEGATIVE

## 2022-01-01 LAB — APTT: aPTT: 29 seconds (ref 24–36)

## 2022-01-01 LAB — SURGICAL PCR SCREEN
MRSA, PCR: NEGATIVE
Staphylococcus aureus: NEGATIVE

## 2022-01-01 NOTE — Progress Notes (Addendum)
PCP - denies Cardiologist - Glori Bickers, MD  PPM/ICD - yes Device Orders - yes Rep Notified - yes  Chest x-ray - n/a EKG - 01/01/2022 Stress Test - 03/17/20 ECHO - 10/09/21 Cardiac Cath - 01/22/19  Sleep Study - yes, positive for OSA CPAP - yes  Fasting Blood Sugar - n/a  Blood Thinner Instructions: n/a  Aspirin Instructions: Patient was instructed: As of today, STOP taking any Aspirin (unless otherwise instructed by your surgeon) Aleve, Naproxen, Ibuprofen, Motrin, Advil, Goody's, BC's, all herbal medications, fish oil, and all vitamins.    ERAS Protcol - n/a  COVID TEST- n/a   Anesthesia review: yes - cardiac history; patient verbalized that after her previous surgery her BP dropped  Patient denies shortness of breath, fever, cough and chest pain at PAT appointment   All instructions explained to the patient, with a verbal understanding of the material. Patient agrees to go over the instructions while at home for a better understanding. Patient also instructed to self quarantine after being tested for COVID-19. The opportunity to ask questions was provided.

## 2022-01-02 NOTE — Anesthesia Preprocedure Evaluation (Addendum)
Anesthesia Evaluation  Patient identified by MRN, date of birth, ID band Patient awake    Reviewed: Allergy & Precautions, NPO status , Patient's Chart, lab work & pertinent test results  Airway Mallampati: III  TM Distance: >3 FB Neck ROM: Full    Dental  (+) Dental Advisory Given   Pulmonary sleep apnea    Pulmonary exam normal        Cardiovascular +CHF  Normal cardiovascular exam+ Cardiac Defibrillator    Echo 10/2021 IMPRESSIONS    1. Left ventricular ejection fraction, by estimation, is 25 to 30%. The  left ventricle has severely decreased function. The left ventricle  demonstrates global hypokinesis. The left ventricular internal cavity size  was severely dilated. Left ventricular  diastolic parameters are indeterminate.  2. Right ventricular systolic function is normal. The right ventricular  size is mildly enlarged.  3. Left atrial size was moderately dilated.  4. The mitral valve is normal in structure. Mild mitral valve  regurgitation. No evidence of mitral stenosis.  5. The aortic valve was not well visualized. Aortic valve regurgitation  is not visualized. No aortic stenosis is present.  6. The inferior vena cava is normal in size with greater than 50%  respiratory variability, suggesting right atrial pressure of 3 mmHg.    Neuro/Psych negative neurological ROS     GI/Hepatic negative GI ROS, Neg liver ROS,,,  Endo/Other    Morbid obesity  Renal/GU negative Renal ROS     Musculoskeletal negative musculoskeletal ROS (+)    Abdominal   Peds  Hematology  (+) Blood dyscrasia, anemia   Anesthesia Other Findings   Reproductive/Obstetrics                             Anesthesia Physical Anesthesia Plan  ASA: 4  Anesthesia Plan: General   Post-op Pain Management: Celebrex PO (pre-op)* and Tylenol PO (pre-op)*   Induction: Intravenous  PONV Risk Score and Plan: 4  or greater and Ondansetron, Dexamethasone, Midazolam and Treatment may vary due to age or medical condition  Airway Management Planned: Oral ETT  Additional Equipment: Arterial line  Intra-op Plan:   Post-operative Plan: Extubation in OR  Informed Consent: I have reviewed the patients History and Physical, chart, labs and discussed the procedure including the risks, benefits and alternatives for the proposed anesthesia with the patient or authorized representative who has indicated his/her understanding and acceptance.     Dental advisory given  Plan Discussed with: Anesthesiologist and CRNA  Anesthesia Plan Comments: (PAT note written 01/02/2022 by Myra Gianotti, PA-C. )        Anesthesia Quick Evaluation

## 2022-01-02 NOTE — Progress Notes (Signed)
Anesthesia Chart Review:  Case: [id number] Date/Time: 01/11/22 0715   Procedure: INSERTION OF Acquanetta Belling (Right)   Anesthesia type: General   Pre-op diagnosis: Congestive heart failure   Location: [facility] OR ROOM 12 / [facility]   Surgeons: Serafina Mitchell, MD       DISCUSSION: Patient is a 36 year old female scheduled for the above procedure.   History includes never smoker, non-ischemic cardiomyopathy (diagnosed 06/2018,, EF 25%, viral versus post-partum versus PVC induced; EF 25-30% 12/17/22), chronic systolic CHF, PVCs, ICD (Abbott Gallant ICD 02/19/19; replacement RV ICD lead 04/16/19), GERD, anemia, OSA (uses CPAP). BMI is consistent with morbid obesity. She reported hypotension with prior surgery.    Perioperative ICD EP recommendations: Device Information: Clinic EP Physician:  Dr. Lars Mage  Device Type:  Defibrillator Manufacturer and Phone #:  St. Jude/Abbott: [phone] Pacemaker Dependent?:  No. Date of Last Device Check:  12/25/2021       Normal Device Function?:  Yes.     Electrophysiologist's Recommendations: Have magnet available. Provide continuous ECG monitoring when magnet is used or reprogramming is to be performed.  Procedure will likely interfere with device function.  Device should be programmed:  Tachy therapies disabled  Last Ozempic dose is scheduled for 01/02/22.   Anesthesia team to evaluate on the day of surgery.  For urine pregnancy test on the day of surgery.   VS: BP 129/84   Pulse 78   Temp 36.8 C (Oral)   Resp 18   Ht '5\' 1"'$  (1.549 m)   Wt 117.7 kg   LMP 12/11/2021   SpO2 100%   BMI 49.01 kg/m    PROVIDERS: Patient, No Pcp Per Glori Bickers, MD is HF cardiologist. Last visit 10/23/21. Volume status okay. No a candidate for cMRI due to size. She was compliant with CPAP. Screened for [employer]. Lars Mage, MD is EP. Last visit 10/23/21. ICD functioning appropriately. Amiodarone discontinued since no change in EF with PVC suppression. One  year follow-up planned.    LABS: Labs reviewed: Acceptable for surgery. (all labs ordered are listed, but only abnormal results are displayed)  Labs Reviewed  CBC - Abnormal; Notable for the following components:      Result Value   Hemoglobin 11.6 (*)    HCT 35.9 (*)    All other components within normal limits  URINALYSIS, ROUTINE W REFLEX MICROSCOPIC - Abnormal; Notable for the following components:   Specific Gravity, Urine >1.030 (*)    All other components within normal limits  SURGICAL PCR SCREEN  COMPREHENSIVE METABOLIC PANEL  PROTIME-INR  APTT  TYPE AND SCREEN    EKG: 01/01/22: Sinus rhythm with occasional Premature ventricular complexes Minimal voltage criteria for LVH, may be normal variant ( Cornell product ) Anterior infarct , age undetermined Abnormal ECG No significant change since last tracing Confirmed by Vernell Leep ([id number]) on 01/01/2022 3:51:08 PM   CV: US Carotid 12/04/21: Summary:  - Right Carotid: There is no evidence of stenosis in the right ICA.  - Left Carotid: There is no evidence of stenosis in the left ICA.  - Vertebrals:  Bilateral vertebral arteries demonstrate antegrade flow.  - Subclavians: Normal flow hemodynamics were seen in bilateral subclavian arteries.    Echo 10/09/21: IMPRESSIONS   1. Left ventricular ejection fraction, by estimation, is 25 to 30%. The  left ventricle has severely decreased function. The left ventricle  demonstrates global hypokinesis. The left ventricular internal cavity size  was severely dilated. Left ventricular  diastolic parameters are  indeterminate.   2. Right ventricular systolic function is normal. The right ventricular  size is mildly enlarged.   3. Left atrial size was moderately dilated.   4. The mitral valve is normal in structure. Mild mitral valve  regurgitation. No evidence of mitral stenosis.   5. The aortic valve was not well visualized. Aortic valve regurgitation  is not visualized. No  aortic stenosis is present.   6. The inferior vena cava is normal in size with greater than 50%  respiratory variability, suggesting right atrial pressure of 3 mmHg.  - Comparison echo 03/17/20 LVEF 25-30%, global hypokinesis, mild-moderate MR; 01/15/19 LVEF 30-35%, diffuse hypokinesis   CPX 03/17/20: Conclusion: The interpretation of this test is limited due to submaximal effort during the exercise. Based on available data, exercise testing with gas exchange demonstrates mild functional impairment when compared to matched sedentary norms. There is no evidence for cardiopulmonary or HF limitation. With corrections for ideal body weight, PVO2 corrects to a normal range.  Test, report and preliminary impression by:  Landis Martins, MS, ACSM-RCEP  03/17/2020 11:33 AM   Attending: Agree with above. Submax test. No evidence of significant cardiopulmonary limitation despite LV dysfunction. When corrected to ideal body weight, measured pVO2 is normal. Suspect majority of limitation related to obesity.  Glori Bickers, MD    RHC/LHC 01/22/19: Findings:  Ao = 112/73 (90) LV =  100/20 RA =  5 RV = 37/5 PA = 42/12 (25) PCW = 15 Fick cardiac output/index = 7.8/3.7 SVR = 876 PVR = 1.2 Ao sat = 99% PA sat = 77%, 78% High SVC sat = 81%   Assessment: 1. Severe NICM EF 20% 2. Normal coronaries 3. Well compensated hemodynamics with high cardiac output  4. No evidence of intracardiac shunting   Plan/Discussion: Continue medical therapy. If symptoms persist, consider CPX testing.    ZioXT 12/23/18-12/26/18: 1. Sinus rhythm - avg HR of 77 2. Occasional PVCs (3.5%) 3. No high-grade arrhythmias    Past Medical History:  Diagnosis Date   AICD (automatic cardioverter/defibrillator) present    Anemia    CHRONIC   CHF (congestive heart failure) (HCC)    Chronic systolic dysfunction of left ventricle    GERD (gastroesophageal reflux disease)    WITH PREGNANCY   Nonischemic cardiomyopathy  (HCC)    Pneumonia    x 1   PVC's (premature ventricular contractions)    Sleep apnea     Past Surgical History:  Procedure Laterality Date   CARDIAC CATHETERIZATION     DILATION AND EVACUATION N/A 11/30/2016   Procedure: DILATATION AND EVACUATION;  Surgeon: Donnamae Jude, MD;  Location: [facility];  Service: Gynecology;  Laterality: N/A;   ICD IMPLANT N/A 02/19/2019   Procedure: ICD IMPLANT;  Surgeon: Thompson Grayer, MD;  Location: [facility];  Service: Cardiovascular;  Laterality: N/A;   LEAD REVISION/REPAIR N/A 04/16/2019   Procedure: LEAD REVISION/REPAIR;  Surgeon: Thompson Grayer, MD;  Location: [facility];  Service: Cardiovascular;  Laterality: N/A;   RIGHT/LEFT HEART CATH AND CORONARY ANGIOGRAPHY N/A 01/22/2019   Procedure: RIGHT/LEFT HEART CATH AND CORONARY ANGIOGRAPHY;  Surgeon: Jolaine Artist, MD;  Location: [facility];  Service: Cardiovascular;  Laterality: N/A;   WISDOM TOOTH EXTRACTION     NO ANESTHESIA    MEDICATIONS:  acetaminophen (TYLENOL) 325 MG tablet   amLODipine (NORVASC) 10 MG tablet   cyclobenzaprine (FLEXERIL) 10 MG tablet   dapagliflozin propanediol (FARXIGA) 10 MG TABS tablet   furosemide (  LASIX) 40 MG tablet   levonorgestrel (MIRENA) 20 MCG/24HR IUD   meclizine (ANTIVERT) 25 MG tablet   metoprolol succinate (TOPROL-XL) 25 MG 24 hr tablet   NON FORMULARY   potassium chloride SA (KLOR-CON M) 20 MEQ tablet   sacubitril-valsartan (ENTRESTO) 97-103 MG   Semaglutide, 1 MG/DOSE, (OZEMPIC, 1 MG/DOSE,) 4 MG/3ML SOPN   spironolactone (ALDACTONE) 25 MG tablet   No current facility-administered medications for this encounter.    Myra Gianotti, PA-C Surgical Short Stay/Anesthesiology [facility] Phone [phone] [facility] Phone [phone] 01/02/2022 6:21 PM

## 2022-01-11 ENCOUNTER — Other Ambulatory Visit: Payer: Self-pay

## 2022-01-11 ENCOUNTER — Ambulatory Visit (HOSPITAL_COMMUNITY)
Admission: RE | Admit: 2022-01-11 | Discharge: 2022-01-11 | Disposition: A | Discharge status: Home / Self Care | Payer: Medicare Other | Attending: Surgery | Admitting: Surgery

## 2022-01-11 ENCOUNTER — Encounter (HOSPITAL_COMMUNITY)
Admission: RE | Disposition: A | Discharge status: Home / Self Care | Payer: Self-pay | Source: Home / Self Care | Attending: Surgery

## 2022-01-11 ENCOUNTER — Ambulatory Visit (HOSPITAL_COMMUNITY): Payer: Medicare Other | Admitting: Vascular Surgery

## 2022-01-11 ENCOUNTER — Encounter (HOSPITAL_COMMUNITY): Payer: Self-pay | Admitting: Surgery

## 2022-01-11 ENCOUNTER — Ambulatory Visit (HOSPITAL_BASED_OUTPATIENT_CLINIC_OR_DEPARTMENT_OTHER): Payer: Medicare Other | Admitting: Certified Registered Nurse Anesthetist

## 2022-01-11 DIAGNOSIS — D649 Anemia, unspecified: Secondary | ICD-10-CM | POA: Diagnosis not present

## 2022-01-11 DIAGNOSIS — I509 Heart failure, unspecified: Secondary | ICD-10-CM | POA: Insufficient documentation

## 2022-01-11 DIAGNOSIS — Z6841 Body Mass Index (BMI) 40.0 and over, adult: Secondary | ICD-10-CM | POA: Diagnosis not present

## 2022-01-11 DIAGNOSIS — Z95810 Presence of automatic (implantable) cardiac defibrillator: Secondary | ICD-10-CM | POA: Diagnosis not present

## 2022-01-11 DIAGNOSIS — I5022 Chronic systolic (congestive) heart failure: Secondary | ICD-10-CM

## 2022-01-11 DIAGNOSIS — I428 Other cardiomyopathies: Secondary | ICD-10-CM | POA: Insufficient documentation

## 2022-01-11 LAB — POCT PREGNANCY, URINE: Preg Test, Ur: NEGATIVE

## 2022-01-11 SURGERY — INSERTION, CAROTID SINUS BAROREFLEX ACTIVATION DEVICE
Anesthesia: General | Laterality: Right

## 2022-01-11 MED ORDER — OXYCODONE HCL 5 MG PO TABS
5.0000 mg | ORAL_TABLET | Freq: Once | ORAL | Status: AC
  Administered 2022-01-11: 5 mg via ORAL

## 2022-01-11 MED ORDER — 0.9 % SODIUM CHLORIDE (POUR BTL) OPTIME
TOPICAL | Status: DC | PRN
  Administered 2022-01-11: 1000 mL

## 2022-01-11 MED ORDER — CELECOXIB 200 MG PO CAPS
200.0000 mg | ORAL_CAPSULE | Freq: Once | ORAL | Status: AC
  Administered 2022-01-11: 200 mg via ORAL
  Filled 2022-01-11: qty 1

## 2022-01-11 MED ORDER — LACTATED RINGERS IV SOLN: INTRAVENOUS | Status: DC | PRN

## 2022-01-11 MED ORDER — ACETAMINOPHEN 500 MG PO TABS
1000.0000 mg | ORAL_TABLET | Freq: Once | ORAL | Status: AC
  Administered 2022-01-11: 1000 mg via ORAL
  Filled 2022-01-11: qty 2

## 2022-01-11 MED ORDER — SCOPOLAMINE 1 MG/3DAYS TD PT72
1.0000 | MEDICATED_PATCH | TRANSDERMAL | Status: DC
  Administered 2022-01-11: 1.5 mg via TRANSDERMAL
  Filled 2022-01-11: qty 1

## 2022-01-11 MED ORDER — DEXAMETHASONE SODIUM PHOSPHATE 10 MG/ML IJ SOLN
INTRAMUSCULAR | Status: DC | PRN
  Administered 2022-01-11: 5 mg via INTRAVENOUS

## 2022-01-11 MED ORDER — ROCURONIUM BROMIDE 10 MG/ML (PF) SYRINGE
PREFILLED_SYRINGE | INTRAVENOUS | Status: DC | PRN
  Administered 2022-01-11: 60 mg via INTRAVENOUS

## 2022-01-11 MED ORDER — OXYCODONE HCL 5 MG PO TABS
ORAL_TABLET | ORAL | Status: AC
  Filled 2022-01-11: qty 1

## 2022-01-11 MED ORDER — FENTANYL CITRATE (PF) 250 MCG/5ML IJ SOLN
INTRAMUSCULAR | Status: AC
  Filled 2022-01-11: qty 5

## 2022-01-11 MED ORDER — ONDANSETRON HCL 4 MG/2ML IJ SOLN
INTRAMUSCULAR | Status: DC | PRN
  Administered 2022-01-11: 4 mg via INTRAVENOUS

## 2022-01-11 MED ORDER — PHENYLEPHRINE HCL-NACL 20-0.9 MG/250ML-% IV SOLN
INTRAVENOUS | Status: DC | PRN
  Administered 2022-01-11: 30 ug/min via INTRAVENOUS

## 2022-01-11 MED ORDER — PROPOFOL 10 MG/ML IV BOLUS
INTRAVENOUS | Status: AC
  Filled 2022-01-11: qty 20

## 2022-01-11 MED ORDER — CEFAZOLIN SODIUM-DEXTROSE 2-4 GM/100ML-% IV SOLN
2.0000 g | INTRAVENOUS | Status: AC
  Administered 2022-01-11: 2 g via INTRAVENOUS
  Filled 2022-01-11: qty 100

## 2022-01-11 MED ORDER — FENTANYL CITRATE (PF) 100 MCG/2ML IJ SOLN: 25.0000 ug | INTRAMUSCULAR | Status: DC | PRN

## 2022-01-11 MED ORDER — PROMETHAZINE HCL 25 MG/ML IJ SOLN: 6.2500 mg | INTRAMUSCULAR | Status: DC | PRN

## 2022-01-11 MED ORDER — MIDAZOLAM HCL 2 MG/2ML IJ SOLN
INTRAMUSCULAR | Status: AC
  Filled 2022-01-11: qty 2

## 2022-01-11 MED ORDER — AMISULPRIDE (ANTIEMETIC) 5 MG/2ML IV SOLN
10.0000 mg | Freq: Once | INTRAVENOUS | Status: AC | PRN
  Administered 2022-01-11: 10 mg via INTRAVENOUS

## 2022-01-11 MED ORDER — MIDAZOLAM HCL 2 MG/2ML IJ SOLN
INTRAMUSCULAR | Status: DC | PRN
  Administered 2022-01-11: 2 mg via INTRAVENOUS

## 2022-01-11 MED ORDER — SUCCINYLCHOLINE CHLORIDE 200 MG/10ML IV SOSY
PREFILLED_SYRINGE | INTRAVENOUS | Status: DC | PRN
  Administered 2022-01-11: 160 mg via INTRAVENOUS

## 2022-01-11 MED ORDER — SUGAMMADEX SODIUM 200 MG/2ML IV SOLN
INTRAVENOUS | Status: DC | PRN
  Administered 2022-01-11: 200 mg via INTRAVENOUS

## 2022-01-11 MED ORDER — CHLORHEXIDINE GLUCONATE CLOTH 2 % EX PADS: 6.0000 | MEDICATED_PAD | Freq: Once | CUTANEOUS | Status: DC

## 2022-01-11 MED ORDER — SODIUM CHLORIDE 0.9 % IV SOLN: INTRAVENOUS | Status: DC

## 2022-01-11 MED ORDER — BUPIVACAINE HCL (PF) 0.5 % IJ SOLN
INTRAMUSCULAR | Status: AC
  Filled 2022-01-11: qty 30

## 2022-01-11 MED ORDER — OXYCODONE-ACETAMINOPHEN 5-325 MG PO TABS: 1.0000 | ORAL_TABLET | Freq: Four times a day (QID) | ORAL | 0 refills | Status: DC | PRN

## 2022-01-11 MED ORDER — LIDOCAINE HCL 1 % IJ SOLN
INTRAMUSCULAR | Status: AC
  Filled 2022-01-11: qty 20

## 2022-01-11 MED ORDER — FENTANYL CITRATE (PF) 250 MCG/5ML IJ SOLN
INTRAMUSCULAR | Status: DC | PRN
  Administered 2022-01-11: 50 ug via INTRAVENOUS
  Administered 2022-01-11: 100 ug via INTRAVENOUS
  Administered 2022-01-11: 25 ug via INTRAVENOUS

## 2022-01-11 MED ORDER — CHLORHEXIDINE GLUCONATE 0.12 % MT SOLN
15.0000 mL | Freq: Once | OROMUCOSAL | Status: AC
  Administered 2022-01-11: 15 mL via OROMUCOSAL

## 2022-01-11 MED ORDER — PHENYLEPHRINE 80 MCG/ML (10ML) SYRINGE FOR IV PUSH (FOR BLOOD PRESSURE SUPPORT)
PREFILLED_SYRINGE | INTRAVENOUS | Status: DC | PRN
  Administered 2022-01-11: 80 ug via INTRAVENOUS

## 2022-01-11 MED ORDER — LIDOCAINE 2% (20 MG/ML) 5 ML SYRINGE
INTRAMUSCULAR | Status: DC | PRN
  Administered 2022-01-11: 100 mg via INTRAVENOUS

## 2022-01-11 MED ORDER — ETOMIDATE 2 MG/ML IV SOLN
INTRAVENOUS | Status: DC | PRN
  Administered 2022-01-11: 20 mg via INTRAVENOUS

## 2022-01-11 MED ORDER — SODIUM CHLORIDE 0.9 % IV SOLN
INTRAVENOUS | Status: DC | PRN
  Administered 2022-01-11: 500 mL

## 2022-01-11 MED ORDER — AMISULPRIDE (ANTIEMETIC) 5 MG/2ML IV SOLN
INTRAVENOUS | Status: AC
  Filled 2022-01-11: qty 4

## 2022-01-11 SURGICAL SUPPLY — 36 items
ADH SKN CLS APL DERMABOND .7 (GAUZE/BANDAGES/DRESSINGS) ×2
APL PRP STRL LF DISP 70% ISPRP (MISCELLANEOUS) ×1
BAG COUNTER SPONGE SURGICOUNT (BAG) ×2 IMPLANT
BAG SPNG CNTER NS LX DISP (BAG) ×1
CANISTER SUCT 3000ML PPV (MISCELLANEOUS) ×2 IMPLANT
CHLORAPREP W/TINT 26 (MISCELLANEOUS) ×2 IMPLANT
CLIP VESOCCLUDE MED 6/CT (CLIP) IMPLANT
CLIP VESOCCLUDE SM WIDE 6/CT (CLIP) IMPLANT
DERMABOND ADVANCED .7 DNX12 (GAUZE/BANDAGES/DRESSINGS) ×2 IMPLANT
ELECT REM PT RETURN 9FT ADLT (ELECTROSURGICAL) ×1
ELECTRODE REM PT RTRN 9FT ADLT (ELECTROSURGICAL) ×2 IMPLANT
GENERATOR IPG BAROSTIM 2104 (Generator) IMPLANT
GLOVE SURG SS PI 7.5 STRL IVOR (GLOVE) ×6 IMPLANT
GOWN STRL REUS W/ TWL LRG LVL3 (GOWN DISPOSABLE) ×6 IMPLANT
GOWN STRL REUS W/ TWL XL LVL3 (GOWN DISPOSABLE) ×2 IMPLANT
GOWN STRL REUS W/TWL LRG LVL3 (GOWN DISPOSABLE) ×3
GOWN STRL REUS W/TWL XL LVL3 (GOWN DISPOSABLE) ×1
KIT BASIN OR (CUSTOM PROCEDURE TRAY) ×2 IMPLANT
KIT TURNOVER KIT B (KITS) ×2 IMPLANT
LEAD CAROTID BAROSTIM 1036 (Lead) IMPLANT
MARKER SKIN DUAL TIP RULER LAB (MISCELLANEOUS) ×2 IMPLANT
NDL 18GX1X1/2 (RX/OR ONLY) (NEEDLE) ×2 IMPLANT
NEEDLE 18GX1X1/2 (RX/OR ONLY) (NEEDLE) ×1 IMPLANT
NS IRRIG 1000ML POUR BTL (IV SOLUTION) ×4 IMPLANT
PACK CAROTID (CUSTOM PROCEDURE TRAY) ×2 IMPLANT
PAD ARMBOARD 7.5X6 YLW CONV (MISCELLANEOUS) ×4 IMPLANT
POSITIONER HEAD DONUT 9IN (MISCELLANEOUS) ×2 IMPLANT
SUT ETHIBOND CT1 BRD #0 30IN (SUTURE) ×4 IMPLANT
SUT PROLENE 6 0 BV (SUTURE) ×16 IMPLANT
SUT VIC AB 3-0 SH 27 (SUTURE) ×2
SUT VIC AB 3-0 SH 27X BRD (SUTURE) ×4 IMPLANT
SUT VICRYL 4-0 PS2 18IN ABS (SUTURE) ×4 IMPLANT
SYR 5ML LL (SYRINGE) ×2 IMPLANT
SYR BULB IRRIG 60ML STRL (SYRINGE) ×2 IMPLANT
TOWEL GREEN STERILE (TOWEL DISPOSABLE) ×2 IMPLANT
WATER STERILE IRR 1000ML POUR (IV SOLUTION) ×2 IMPLANT

## 2022-01-11 NOTE — Op Note (Signed)
    Patient name: Tara Bradshaw MRN: [id number] DOB: 08/15/1985 Sex: female  01/11/2022 Pre-operative Diagnosis: NYHA calss III heart failure Post-operative diagnosis:  Same Surgeon:  Annamarie Major Assistants:  Leontine Locket, PA Procedure:  Right Barostim Neo device implant Anesthesia:  Gneral Blood Loss:  minimal Specimens:  none  Findings:  Device at position "A"  LEAD SN [id number] Model 1036 Device SN [id number] Model 2104  Indications: This is a 36 year old female with congestive heart failure and symptoms despite goal-directed medical therapy.  She comes in today for device implant.  Procedure:  The patient was identified in the holding area and taken to [place] 12  The patient was then placed supine on the table. general anesthesia was administered.  The patient was prepped and draped in the usual sterile fashion.  A time out was called and antibiotics were administered.  A PA was necessary to expedite the procedure and assist with technical details.  She helped provide exposure by providing suction and retraction.  She also help with testing of the device and wound closure.  Ultrasound was used to identify the carotid bifurcation and the right neck.  A 3 cm oblique incision was made over top of the bifurcation.  Cautery was used to divide the subcutaneous tissue and platysma muscle.  The facial vein was ligated between silk ties.  I then exposed the carotid bifurcation as well as the distal common carotid artery.  The hypoglossal nerve was visualized and protected.  It was approximately 1 cm above the carotid bifurcation.  Next, I made the pocket.  Incision was made a fingerbreadth below the clavicle.  Cautery was used about subcutaneous tissue down to the pectoral fascia.  Cautery and blunt dissection were used to create the pocket.  Once the pocket was completed, a tonsil clamp was used to create a tunnel between the 2 incisions and the tubing was brought through the tunnel and  connected to the battery.  The battery was then placed into the pocket.  Device testing was then begun with the electrode and position a.  We had a good hemodynamic response.  Two 6-0 Prolene sutures were then placed to secure the electrode.  We repeated testing and again had an excellent response.  4 additional 6-0 Prolene sutures were used to tack down the electrode to the adventitia of the carotid artery.  Next the strain relief loop was sutured to the adventitia of the distal common carotid artery.  The pocket wound was then irrigated with antibiotic impregnated solution and hemostasis was achieved.  Similarly the carotid incision was irrigated with antibiotic impregnated solution.  Hemostasis was achieved.  The neck incision was closed by reapproximating the platysma with 3-0 Vicryl and the skin with 4-0 Vicryl.  The pocket incision was closed by reapproximating the subcutaneous tissue with 3-0 Vicryl and the skin with 4-0 Vicryl.  Dermabond was placed on the incisions.  There were no immediate complications.  The patient tolerated procedure well was taken the PACU in stable condition.   Disposition: To PACU stable.   Theotis Burrow, M.D., [facility] [place] Office: [phone] Pager:  [phone]

## 2022-01-11 NOTE — Transfer of Care (Signed)
Immediate Anesthesia Transfer of Care Note  Patient: Tara Bradshaw  Procedure(s) Performed: INSERTION OF RIGHT BAROSTIM (Right)  Patient Location: PACU  Anesthesia Type:General  Level of Consciousness: drowsy and patient cooperative  Airway & Oxygen Therapy: Patient Spontanous Breathing and Patient connected to nasal cannula oxygen  Post-op Assessment: Report given to RN, Post -op Vital signs reviewed and stable and Patient moving all extremities X 4  Post vital signs: Reviewed and stable  Last Vitals:  Vitals Value Taken Time  BP 137/80 01/11/22 0937  Temp    Pulse 58 01/11/22 0940  Resp 0 01/11/22 0940  SpO2 92 % 01/11/22 0940  Vitals shown include unvalidated device data.  Last Pain:  Vitals:   01/11/22 0651  TempSrc:   PainSc: 0-No pain         Complications: No notable events documented.

## 2022-01-11 NOTE — H&P (Signed)
[facility]   Patient name: Tara Bradshaw MRN: [id number]        DOB: 04-13-1985          Sex: female     REQUESTING PROVIDER:     Dr. Haroldine Laws     REASON FOR CONSULT:    Barostim evaluation   HISTORY OF PRESENT ILLNESS:    Tara Bradshaw is a 36 y.o. female, who is referred for evaluation of a Barostim implant.  She suffers from nonischemic cardiomyopathy with normal coronaries.  She was hospitalized in 2020 with an ejection fraction of 20% following a viral illness, 7 months postpartum.  She was COVID-negative.  Her most recent echo on 10/09/2021 shows an ejection fraction of 25 to 30%.  She is on goal-directed medical therapy and is considered NYHA II-III she does have a ICD in place.  She suffers from morbid obesity and is on Ozempic   PAST MEDICAL HISTORY          Past Medical History:  Diagnosis Date   Anemia      CHRONIC   CHF (congestive heart failure) (HCC)     Chronic systolic dysfunction of left ventricle     GERD (gastroesophageal reflux disease)      WITH PREGNANCY   Nonischemic cardiomyopathy (HCC)     Pneumonia      x 1   PVC's (premature ventricular contractions)          FAMILY HISTORY         Family History  Problem Relation Age of Onset   Epilepsy Mother     Asthma Mother     Cancer Paternal Grandmother          BREAST   Cancer Maternal Aunt 82        BREAST   Hypertension Maternal Grandmother        SOCIAL HISTORY:    Social History         Socioeconomic History   Marital status: Legally Separated      Spouse name: Not on file   Number of children: Not on file   Years of education:  14   Highest education level: Not on file  Occupational History   Occupation: SECURITY OFFICER      Employer: [employer]  Tobacco Use   Smoking status: Never   Smokeless tobacco: Never  Vaping Use   Vaping Use: Never used  Substance and Sexual Activity   Alcohol use: No    Drug use: No   Sexual activity: Yes      Partners: Male      Birth control/protection: None  Other Topics Concern   Not on file  Social History Narrative    Lives in [place] with husband and children (6,1)    Unemployed     Previously worked in [employer].    Social Determinants of Health        Financial Resource Strain: Low Risk  (12/05/2017)    Overall Financial Resource Strain (CARDIA)     Difficulty of Paying Living Expenses: Not hard at all  Food Insecurity: No Food Insecurity (12/05/2017)    Hunger Vital Sign     Worried About Running Out of Food in the Last Year: Never true     Ran Out of Food in the Last Year: Never true  Transportation Needs: No Transportation Needs (12/05/2017)    PRAPARE - Armed forces logistics/support/administrative officer (Medical): No  Lack of Transportation (Non-Medical): No  Physical Activity: Inactive (12/05/2017)    Exercise Vital Sign     Days of Exercise per Week: 0 days     Minutes of Exercise per Session: 0 min  Stress: Not on file  Social Connections: Not on file  Intimate Partner Violence: Not At Risk (12/05/2017)    Humiliation, Afraid, Rape, and Kick questionnaire     Fear of Current or Ex-Partner: No     Emotionally Abused: No     Physically Abused: No     Sexually Abused: No      ALLERGIES:      No Known Allergies   CURRENT MEDICATIONS:            Current Outpatient Medications  Medication Sig Dispense Refill   acetaminophen (TYLENOL) 325 MG tablet Take 650 mg by mouth every 6 (six) hours as needed for mild pain or headache.       amLODipine (NORVASC) 10 MG tablet Take 1 tablet (10 mg total) by mouth daily. 90 tablet 3   cyclobenzaprine (FLEXERIL) 10 MG tablet Take 1 tablet (10 mg total) by mouth 3 (three) times daily as needed for muscle spasms. 10 tablet 0   dapagliflozin propanediol (FARXIGA) 10 MG TABS tablet Take 1 tablet (10 mg total) by mouth daily before breakfast. 90 tablet 3   furosemide (LASIX) 40 MG tablet Take 1  tablet (40 mg total) by mouth 2 (two) times daily. 180 tablet 3   levonorgestrel (MIRENA) 20 MCG/24HR IUD 1 each by Intrauterine route once.       meclizine (ANTIVERT) 25 MG tablet Take 1 tablet (25 mg total) by mouth 3 (three) times daily as needed for dizziness. 15 tablet 0   metoprolol succinate (TOPROL-XL) 25 MG 24 hr tablet Take 1 tablet (25 mg total) by mouth at bedtime. 30 tablet 6   potassium chloride SA (KLOR-CON M) 20 MEQ tablet Take 1 tablet (20 mEq total) by mouth daily. 90 tablet 3   sacubitril-valsartan (ENTRESTO) 97-103 MG Take 1 tablet by mouth 2 (two) times daily. 180 tablet 3   Semaglutide, 1 MG/DOSE, (OZEMPIC, 1 MG/DOSE,) 4 MG/3ML SOPN Inject 1 mg into the skin once a week. 3 mL 11   spironolactone (ALDACTONE) 25 MG tablet Take 1 tablet (25 mg total) by mouth daily. 90 tablet 3    No current facility-administered medications for this visit.      REVIEW OF SYSTEMS:    '[X]'$  denotes positive finding, '[ ]'$  denotes negative finding Cardiac   Comments:  Chest pain or chest pressure:      Shortness of breath upon exertion:      Short of breath when lying flat:      Irregular heart rhythm:             Vascular      Pain in calf, thigh, or hip brought on by ambulation:      Pain in feet at night that wakes you up from your sleep:       Blood clot in your veins:      Leg swelling:              Pulmonary      Oxygen at home:      Productive cough:       Wheezing:              Neurologic      Sudden weakness in arms or legs:  Sudden numbness in arms or legs:       Sudden onset of difficulty speaking or slurred speech:      Temporary loss of vision in one eye:       Problems with dizziness:              Gastrointestinal      Blood in stool:         Vomited blood:              Genitourinary      Burning when urinating:       Blood in urine:             Psychiatric      Major depression:              Hematologic      Bleeding problems:      Problems with  blood clotting too easily:             Skin      Rashes or ulcers:             Constitutional      Fever or chills:        PHYSICAL EXAM:       Vitals:    12/04/21 0818  BP: 115/79  Pulse: 60  Resp: 20  Temp: 98 F (36.7 C)  SpO2: 98%  Weight: 260 lb (117.9 kg)  Height: '5\' 2"'$  (1.575 m)      GENERAL: The patient is a well-nourished female, in no acute distress. The vital signs are documented above. CARDIAC: There is a regular rate and rhythm.  VASCULAR: Carotid was evaluated with SonoSite.  Her bifurcation is a little on the high side PULMONARY: Nonlabored respirations MUSCULOSKELETAL: There are no major deformities or cyanosis. NEUROLOGIC: No focal weakness or paresthesias are detected. SKIN: There are no ulcers or rashes noted. PSYCHIATRIC: The patient has a normal affect.   STUDIES:    I have reviewed her carotid ultrasound with the following findings: Right Carotid: There is no evidence of stenosis in the right ICA.   Left Carotid: There is no evidence of stenosis in the left ICA.   Vertebrals:  Bilateral vertebral arteries demonstrate antegrade flow.  Subclavians: Normal flow hemodynamics were seen in bilateral subclavian               arteries. ASSESSMENT and PLAN    NYHA II-III: I discussed that she is a good patient for Barostim therapy.  I discussed the details of the procedure including the risks and benefits.  We discussed the risk of infection.  This will be placed on the right side.  She has a [age] at the end of September and would like to wait until after that before having this placed.  In the meantime we will work on Biochemist, clinical.     Annamarie Major, Dorothy Puffer, MD, FACS [facility] [phone] Pager [phone]

## 2022-01-11 NOTE — Anesthesia Procedure Notes (Signed)
Arterial Line Insertion Start/End10/08/2021 7:12 AM, 01/11/2022 7:22 AM Performed by: Duane Boston, MD  Patient location: Pre-op. Preanesthetic checklist: patient identified, IV checked, site marked, risks and benefits discussed, surgical consent, monitors and equipment checked, pre-op evaluation, timeout performed and anesthesia consent Lidocaine 1% used for infiltration Left, radial was placed Catheter size: 20 Fr Hand hygiene performed  and maximum sterile barriers used   Attempts: 1 Procedure performed using ultrasound guided technique. Ultrasound Notes:image(s) printed for medical record Following insertion, dressing applied and Biopatch. Post procedure assessment: normal and unchanged  Patient tolerated the procedure well with no immediate complications.

## 2022-01-11 NOTE — Anesthesia Procedure Notes (Signed)
Procedure Name: Intubation Date/Time: 01/11/2022 8:02 AM  Performed by: Darletta Moll, CRNAPre-anesthesia Checklist: Patient identified, Emergency Drugs available, Suction available and Patient being monitored Patient Re-evaluated:Patient Re-evaluated prior to induction Oxygen Delivery Method: Circle system utilized Preoxygenation: Pre-oxygenation with 100% oxygen Induction Type: IV induction Ventilation: Mask ventilation without difficulty Laryngoscope Size: Mac and 4 Grade View: Grade I Tube type: Oral Tube size: 7.0 mm Number of attempts: 1 Airway Equipment and Method: Stylet Placement Confirmation: ETT inserted through vocal cords under direct vision, positive ETCO2 and breath sounds checked- equal and bilateral Secured at: 21 cm Tube secured with: Tape Dental Injury: Teeth and Oropharynx as per pre-operative assessment

## 2022-01-11 NOTE — Anesthesia Postprocedure Evaluation (Signed)
Anesthesia Post Note  Patient: Tara Bradshaw  Procedure(s) Performed: INSERTION OF RIGHT BAROSTIM (Right)     Patient location during evaluation: PACU Anesthesia Type: General Level of consciousness: sedated Pain management: pain level controlled Vital Signs Assessment: post-procedure vital signs reviewed and stable Respiratory status: spontaneous breathing and respiratory function stable Cardiovascular status: stable Postop Assessment: no apparent nausea or vomiting Anesthetic complications: no   No notable events documented.  Last Vitals:  Vitals:   01/11/22 1015 01/11/22 1030  BP: 115/62 115/62  Pulse: (!) 53   Resp: 15   Temp:    SpO2: 91%     Last Pain:  Vitals:   01/11/22 1031  TempSrc:   PainSc: 4                  Kevion Fatheree DANIEL

## 2022-01-15 ENCOUNTER — Telehealth: Payer: Self-pay | Admitting: Cardiology

## 2022-01-15 NOTE — Telephone Encounter (Signed)
Pt called with severe neck pain at site of barostem I asked pt to call the surgeon but if pain continues to come to ER.

## 2022-01-16 ENCOUNTER — Telehealth: Payer: Self-pay

## 2022-01-16 ENCOUNTER — Ambulatory Visit (INDEPENDENT_AMBULATORY_CARE_PROVIDER_SITE_OTHER): Payer: Medicare Other | Admitting: Physician Assistant

## 2022-01-16 VITALS — BP 114/82 | HR 68 | Temp 97.2°F | Resp 20 | Ht 61.0 in | Wt 255.7 lb

## 2022-01-16 DIAGNOSIS — I5022 Chronic systolic (congestive) heart failure: Secondary | ICD-10-CM

## 2022-01-16 MED ORDER — OXYCODONE-ACETAMINOPHEN 5-325 MG PO TABS: 1.0000 | ORAL_TABLET | Freq: Four times a day (QID) | ORAL | 0 refills | Status: DC | PRN

## 2022-01-16 NOTE — Telephone Encounter (Signed)
-----   Message from Garrel Ridgel sent at 01/16/2022  8:58 AM EDT -----  ----- Message ----- From: Marty Heck, MD Sent: 01/15/2022   6:33 PM EDT To: Marvia Pickles; [facility]; #  This patient called tonight stating she is having a lot of pain at the site of her barostem last week with Dr. Trula Slade with some ongoing swallowing issues.  States the site looks fine.  Can we get her a triage appointment with the PA's tomorrow?  Discussed she could try 2 oxycodone tonight before bed to see if that allows her to get some relief.  Thanks,  Gerald Stabs

## 2022-01-16 NOTE — Progress Notes (Signed)
POST OPERATIVE OFFICE NOTE    CC:  F/u for surgery  HPI:  This is a 36 y.o. female who is s/p Right Barostim Neo device implant on 01/11/22 by Dr. Trula Slade. This was performed for NYHA class III heart failure. She presents today with concerns of numbness, swelling and pain around her incision radiating to her ear and down into her neck and shoulder. She reports right sided headaches and she also has had pain with swallowing. Says she cannot sleep at night. She has tried Tylenol, Percocet, heating pad and ice. She denies any slurred speech, difficulty swallowing, changes in vision, or extremity weakness.   No Known Allergies  Current Outpatient Medications  Medication Sig Dispense Refill   acetaminophen (TYLENOL) 325 MG tablet Take 650 mg by mouth every 6 (six) hours as needed for mild pain or headache.     amLODipine (NORVASC) 10 MG tablet Take 1 tablet (10 mg total) by mouth daily. 90 tablet 3   cyclobenzaprine (FLEXERIL) 10 MG tablet Take 1 tablet (10 mg total) by mouth 3 (three) times daily as needed for muscle spasms. 10 tablet 0   dapagliflozin propanediol (FARXIGA) 10 MG TABS tablet Take 1 tablet (10 mg total) by mouth daily before breakfast. 90 tablet 3   furosemide (LASIX) 40 MG tablet Take 1 tablet (40 mg total) by mouth 2 (two) times daily. 180 tablet 3   levonorgestrel (MIRENA) 20 MCG/24HR IUD 1 each by Intrauterine route once.     meclizine (ANTIVERT) 25 MG tablet Take 1 tablet (25 mg total) by mouth 3 (three) times daily as needed for dizziness. 15 tablet 0   metoprolol succinate (TOPROL-XL) 25 MG 24 hr tablet Take 1 tablet (25 mg total) by mouth at bedtime. 30 tablet 6   NON FORMULARY Pt uses a cpap nightly     oxyCODONE-acetaminophen (PERCOCET) 5-325 MG tablet Take 1 tablet by mouth every 6 (six) hours as needed for severe pain. 15 tablet 0   oxyCODONE-acetaminophen (PERCOCET/ROXICET) 5-325 MG tablet Take 1 tablet by mouth every 6 (six) hours as needed for severe pain. 24 tablet  0   potassium chloride SA (KLOR-CON M) 20 MEQ tablet Take 1 tablet (20 mEq total) by mouth daily. 90 tablet 3   sacubitril-valsartan (ENTRESTO) 97-103 MG Take 1 tablet by mouth 2 (two) times daily. 180 tablet 3   Semaglutide, 1 MG/DOSE, (OZEMPIC, 1 MG/DOSE,) 4 MG/3ML SOPN Inject 1 mg into the skin once a week. 3 mL 11   spironolactone (ALDACTONE) 25 MG tablet Take 1 tablet (25 mg total) by mouth daily. 90 tablet 3   No current facility-administered medications for this visit.     ROS:  See HPI  Physical Exam:  Vitals:   01/16/22 1254  BP: 114/82  Pulse: 68  Resp: 20  Temp: (!) 97.2 F (36.2 C)  TempSrc: Temporal  SpO2: 100%  Weight: 255 lb 11.2 oz (116 kg)  Height: '5\' 1"'$  (1.549 m)   General: appears in discomfort, not in any distress Lungs: non labored Chest: Stimulator incision healing well. No swelling present in right upper chest/ neck. Tenderness to palpation Incision:  right neck incision intact and well appearing. Neck soft, Some fullness around incision which likely is small hematoma.  Extremities:  moving all extremities without deficits Neuro: CN intact. Smile symmetric. Tongue midline. Speech coherent   Assessment/Plan:  This is a 36 y.o. female who is s/p: Right Barostim Neo device implant on 01/11/22 by Dr. Trula Slade. She has had pain, swelling,  numbness, headaches, and painful swallowing since surgery. Clinically incisions are intact and well appearing. Likely small hematoma present. Patient seen with Dr. Stanford Breed who reassured patient that this should get better with time and that she is probably just uncomfortable from wires and possible small hematoma.  - Recommended duplex to evaluate for any fluid collection,etc but due to vascular ultrasound tech availability it was going to be 30-45 minutes and patient elected to go home - She knows to call for earlier follow up if she has worsening symptoms - Prescription for Percocet #24 No Refills sent to patients pharmacy - I  will have her follow up with Dr. Trula Slade on 01/29/22    Karoline Caldwell, PA-C [facility] [phone]  Clinic MD:  Roxanne Mins

## 2022-01-16 NOTE — Telephone Encounter (Signed)
Called pt to get update on how she felt after taking the 2 Oxycodone pills that Dr Carlis Abbott recommended. Pt stated that she was groggy, but every time she woke, her neck was stiff and sore. There was pain up into her ear. She has swelling in her neck, face, and down towards her chest area. Pt stated that she would be out of pain meds by the end of the day. Appt scheduled to be assessed by a PA to r/o issues. Confirmed understanding.

## 2022-01-22 ENCOUNTER — Ambulatory Visit (INDEPENDENT_AMBULATORY_CARE_PROVIDER_SITE_OTHER): Payer: Medicare Other

## 2022-01-22 DIAGNOSIS — Z95810 Presence of automatic (implantable) cardiac defibrillator: Secondary | ICD-10-CM

## 2022-01-22 DIAGNOSIS — I5022 Chronic systolic (congestive) heart failure: Secondary | ICD-10-CM | POA: Diagnosis not present

## 2022-01-24 NOTE — Progress Notes (Signed)
EPIC Encounter for ICM Monitoring  Patient Name: Tara Bradshaw is a 36 y.o. female Date: 01/24/2022 Primary Care Physican: Patient, No Pcp Per Primary Cardiologist: [place] Electrophysiologist: Quentin Ore 10/23/2021 Office Weight: 263 lbs   01/22/2022 Weight: 255 lbs                  Spoke with patient and heart failure questions reviewed.  Transmission results reviewed.  Pt asymptomatic for fluid accumulation.  Reports feeling well at this time and voices no complaints.     Corvue thoracic impedance normal but was suggesting possible fluid accumulation from 10/5 - 10/9 which correlates with Barostim procedure.    Prescribed: Furosemide 40 mg take 1 tablet by mouth twice a day. Potassium 20 mEq take 1 tablet daily.    Labs: 01/01/2022 Creatinine 0.76, BUN 8,   Potassium 3.9, Sodium 138, GFR >60 10/23/2021 Creatinine 0.53, BUN 6,   Potassium 3.9, Sodium 136, GFR >60 05/05/2021 Creatinine 0.69, BUN 9,   Potassium 4.4, Sodium 138, GFR 115 A complete set of results can be found in Results Review.   Recommendations:  No changes and encouraged to call if experiencing any fluid symptoms.   Follow-up plan: ICM clinic phone appointment on 02/26/2022.   91 day device clinic remote transmission 03/05/2022.       EP/Cardiology Office Visits: Recall 04/21/2022 with Dr Haroldine Laws.   01/31/2022 with Oda Kilts, PA.   Copy of ICM check sent to Dr. Quentin Ore.  3 month ICM trend: 01/21/2022.    [name], RN 01/24/2022 1:20 PM

## 2022-01-29 ENCOUNTER — Ambulatory Visit (INDEPENDENT_AMBULATORY_CARE_PROVIDER_SITE_OTHER): Payer: Medicare Other | Admitting: Surgery

## 2022-01-29 ENCOUNTER — Encounter: Payer: Self-pay | Admitting: Surgery

## 2022-01-29 VITALS — BP 114/73 | HR 60 | Temp 98.0°F | Resp 20 | Ht 61.0 in | Wt 258.9 lb

## 2022-01-29 DIAGNOSIS — I5022 Chronic systolic (congestive) heart failure: Secondary | ICD-10-CM

## 2022-01-29 MED ORDER — HYDROCODONE-ACETAMINOPHEN 5-325 MG PO TABS: 1.0000 | ORAL_TABLET | Freq: Four times a day (QID) | ORAL | 0 refills | Status: DC | PRN

## 2022-01-29 NOTE — Progress Notes (Signed)
Patient name: Tara Bradshaw MRN: [id number] DOB: Oct 26, 1985 Sex: female  REASON FOR VISIT:     Post op  HISTORY OF PRESENT ILLNESS:   Tara Bradshaw is a 36 y.o. female who is status post Barostim implant on 01/11/2022.  She was seen in the office with concerns over numbness, swelling, and pain around her incision which radiated to her ear and down into her neck and shoulder.  She was also having right-sided headaches and has had pain with swallowing.  She is back today for follow-up.  She is not having significant issues with her pocket incision.  Her swallowing issues have improved.  She still gets some discomfort around her incision that shoots up behind her ear.  She did not tolerate oxycodone as it makes her too drowsy  CURRENT MEDICATIONS:    Current Outpatient Medications  Medication Sig Dispense Refill   acetaminophen (TYLENOL) 325 MG tablet Take 650 mg by mouth every 6 (six) hours as needed for mild pain or headache.     amLODipine (NORVASC) 10 MG tablet Take 1 tablet (10 mg total) by mouth daily. 90 tablet 3   cyclobenzaprine (FLEXERIL) 10 MG tablet Take 1 tablet (10 mg total) by mouth 3 (three) times daily as needed for muscle spasms. 10 tablet 0   dapagliflozin propanediol (FARXIGA) 10 MG TABS tablet Take 1 tablet (10 mg total) by mouth daily before breakfast. 90 tablet 3   furosemide (LASIX) 40 MG tablet Take 1 tablet (40 mg total) by mouth 2 (two) times daily. 180 tablet 3   HYDROcodone-acetaminophen (NORCO) 5-325 MG tablet Take 1 tablet by mouth every 6 (six) hours as needed for moderate pain. 10 tablet 0   levonorgestrel (MIRENA) 20 MCG/24HR IUD 1 each by Intrauterine route once.     meclizine (ANTIVERT) 25 MG tablet Take 1 tablet (25 mg total) by mouth 3 (three) times daily as needed for dizziness. 15 tablet 0   metoprolol succinate (TOPROL-XL) 25 MG 24 hr tablet Take 1 tablet (25 mg total) by mouth at bedtime. 30 tablet 6   NON FORMULARY  Pt uses a cpap nightly     oxyCODONE-acetaminophen (PERCOCET) 5-325 MG tablet Take 1 tablet by mouth every 6 (six) hours as needed for severe pain. 15 tablet 0   oxyCODONE-acetaminophen (PERCOCET/ROXICET) 5-325 MG tablet Take 1 tablet by mouth every 6 (six) hours as needed for severe pain. 24 tablet 0   potassium chloride SA (KLOR-CON M) 20 MEQ tablet Take 1 tablet (20 mEq total) by mouth daily. 90 tablet 3   sacubitril-valsartan (ENTRESTO) 97-103 MG Take 1 tablet by mouth 2 (two) times daily. 180 tablet 3   Semaglutide, 1 MG/DOSE, (OZEMPIC, 1 MG/DOSE,) 4 MG/3ML SOPN Inject 1 mg into the skin once a week. 3 mL 11   spironolactone (ALDACTONE) 25 MG tablet Take 1 tablet (25 mg total) by mouth daily. 90 tablet 3   No current facility-administered medications for this visit.    REVIEW OF SYSTEMS:   '[X]'$  denotes positive finding, '[ ]'$  denotes negative finding Cardiac  Comments:  Chest pain or chest pressure:    Shortness of breath upon exertion:    Short of breath when lying flat:    Irregular heart rhythm:    Constitutional    Fever or chills:      PHYSICAL EXAM:   Vitals:   01/29/22 1100  BP: 114/73  Pulse: 60  Resp: 20  Temp: 98 F (36.7 C)  Weight: 258 lb 14.4 oz (117.4  kg)  Height: '5\' 1"'$  (1.549 m)    GENERAL: The patient is a well-nourished female, in no acute distress. The vital signs are documented above. CARDIOVASCULAR: There is a regular rate and rhythm. PULMONARY: Non-labored respirations Incisions are well-healed.  I evaluated her carotid incision with the SonoSite.  This appears to be just scar tissue.  I did not see any significant hematoma or fluid collection  STUDIES:   None   MEDICAL ISSUES:   Status post Barostim implant: I do not see anything obviously wrong with her incision.  I suspect that she just has a fair amount of scar tissue around her carotid incision which is causing her some discomfort.  I am giving her 10 Norco tablets to see if this helps  alleviate some of her symptoms.  She did not tolerate her oxycodone because it makes her too drowsy.  She is scheduled to see device clinic in the immediate future.  Leia Alf, MD, FACS [facility] [phone] Pager [phone]

## 2022-01-30 NOTE — Progress Notes (Deleted)
Electrophysiology Office Note Date: 01/30/2022  ID:  Tara Bradshaw, DOB 27-Mar-1986, MRN [id number]  PCP: Patient, No Pcp Per Primary Cardiologist: None Electrophysiologist: Vickie Epley, MD   CC: Routine ICD follow-up  Tara Bradshaw is a 36 y.o. female seen today for Vickie Epley, MD for routine electrophysiology followup.   Pt presents today for post barostim visit. This is her initial visit.   She has had some post op site pain. ***  Her goal for titration is ***  Since last being seen in our clinic the patient reports doing ***.  she denies chest pain, palpitations, dyspnea, PND, orthopnea, nausea, vomiting, dizziness, syncope, edema, weight gain, or early satiety.     {He/she (caps):30048} has not had ICD shocks.   Device History: SJM  Single chamber ICD implanted 02/19/2019 Lead dislodgement > revision 04/16/2019  Barostim (Commercial)  01/11/2022 by Dr. Trula Slade   AAD Amiodarone goes back at least to 2020  Past Medical History:  Diagnosis Date   AICD (automatic cardioverter/defibrillator) present    Anemia    CHRONIC   CHF (congestive heart failure) (HCC)    Chronic systolic dysfunction of left ventricle    GERD (gastroesophageal reflux disease)    WITH PREGNANCY   Nonischemic cardiomyopathy (HCC)    Pneumonia    x 1   PVC's (premature ventricular contractions)    Sleep apnea    Past Surgical History:  Procedure Laterality Date   CARDIAC CATHETERIZATION     DILATION AND EVACUATION N/A 11/30/2016   Procedure: DILATATION AND EVACUATION;  Surgeon: Donnamae Jude, MD;  Location: [facility];  Service: Gynecology;  Laterality: N/A;   ICD IMPLANT N/A 02/19/2019   Procedure: ICD IMPLANT;  Surgeon: Thompson Grayer, MD;  Location: [facility];  Service: Cardiovascular;  Laterality: N/A;   LEAD REVISION/REPAIR N/A 04/16/2019   Procedure: LEAD REVISION/REPAIR;  Surgeon: Thompson Grayer, MD;  Location: [facility];  Service: Cardiovascular;   Laterality: N/A;   RIGHT/LEFT HEART CATH AND CORONARY ANGIOGRAPHY N/A 01/22/2019   Procedure: RIGHT/LEFT HEART CATH AND CORONARY ANGIOGRAPHY;  Surgeon: Jolaine Artist, MD;  Location: [facility];  Service: Cardiovascular;  Laterality: N/A;   WISDOM TOOTH EXTRACTION     NO ANESTHESIA    Current Outpatient Medications  Medication Sig Dispense Refill   acetaminophen (TYLENOL) 325 MG tablet Take 650 mg by mouth every 6 (six) hours as needed for mild pain or headache.     amLODipine (NORVASC) 10 MG tablet Take 1 tablet (10 mg total) by mouth daily. 90 tablet 3   cyclobenzaprine (FLEXERIL) 10 MG tablet Take 1 tablet (10 mg total) by mouth 3 (three) times daily as needed for muscle spasms. 10 tablet 0   dapagliflozin propanediol (FARXIGA) 10 MG TABS tablet Take 1 tablet (10 mg total) by mouth daily before breakfast. 90 tablet 3   furosemide (LASIX) 40 MG tablet Take 1 tablet (40 mg total) by mouth 2 (two) times daily. 180 tablet 3   HYDROcodone-acetaminophen (NORCO) 5-325 MG tablet Take 1 tablet by mouth every 6 (six) hours as needed for moderate pain. 10 tablet 0   levonorgestrel (MIRENA) 20 MCG/24HR IUD 1 each by Intrauterine route once.     meclizine (ANTIVERT) 25 MG tablet Take 1 tablet (25 mg total) by mouth 3 (three) times daily as needed for dizziness. 15 tablet 0   metoprolol succinate (TOPROL-XL) 25 MG 24 hr tablet Take 1 tablet (25 mg total) by mouth at bedtime.  30 tablet 6   NON FORMULARY Pt uses a cpap nightly     oxyCODONE-acetaminophen (PERCOCET) 5-325 MG tablet Take 1 tablet by mouth every 6 (six) hours as needed for severe pain. 15 tablet 0   oxyCODONE-acetaminophen (PERCOCET/ROXICET) 5-325 MG tablet Take 1 tablet by mouth every 6 (six) hours as needed for severe pain. 24 tablet 0   potassium chloride SA (KLOR-CON M) 20 MEQ tablet Take 1 tablet (20 mEq total) by mouth daily. 90 tablet 3   sacubitril-valsartan (ENTRESTO) 97-103 MG Take 1 tablet by mouth 2 (two) times daily.  180 tablet 3   Semaglutide, 1 MG/DOSE, (OZEMPIC, 1 MG/DOSE,) 4 MG/3ML SOPN Inject 1 mg into the skin once a week. 3 mL 11   spironolactone (ALDACTONE) 25 MG tablet Take 1 tablet (25 mg total) by mouth daily. 90 tablet 3   No current facility-administered medications for this visit.    Allergies:   Patient has no known allergies.   Social History: Social History   Socioeconomic History   Marital status: Widowed    Spouse name: Not on file   Number of children: Not on file   Years of education:  14   Highest education level: Not on file  Occupational History   Occupation: SECURITY OFFICER    Employer: [employer]  Tobacco Use   Smoking status: Never    Passive exposure: Never   Smokeless tobacco: Never  Vaping Use   Vaping Use: Never used  Substance and Sexual Activity   Alcohol use: No   Drug use: No   Sexual activity: Yes    Partners: Male    Birth control/protection: None  Other Topics Concern   Not on file  Social History Narrative   Lives in [place] with husband and children (6,1)   Unemployed    Previously worked in [employer].   Social Determinants of Health   Financial Resource Strain: Low Risk  (12/05/2017)   Overall Financial Resource Strain (CARDIA)    Difficulty of Paying Living Expenses: Not hard at all  Food Insecurity: No Food Insecurity (12/05/2017)   Hunger Vital Sign    Worried About Running Out of Food in the Last Year: Never true    Ran Out of Food in the Last Year: Never true  Transportation Needs: No Transportation Needs (12/05/2017)   PRAPARE - Hydrologist (Medical): No    Lack of Transportation (Non-Medical): No  Physical Activity: Inactive (12/05/2017)   Exercise Vital Sign    Days of Exercise per Week: 0 days    Minutes of Exercise per Session: 0 min  Stress: Not on file  Social Connections: Not on file  Intimate Partner Violence: Not At Risk (12/05/2017)   Humiliation, Afraid, Rape, and Kick  questionnaire    Fear of Current or Ex-Partner: No    Emotionally Abused: No    Physically Abused: No    Sexually Abused: No    Family History: Family History  Problem Relation Age of Onset   Epilepsy Mother    Asthma Mother    Cancer Paternal Grandmother        BREAST   Cancer Maternal Aunt 74       BREAST   Hypertension Maternal Grandmother     Review of Systems: All other systems reviewed and are otherwise negative except as noted above.   Physical Exam: There were no vitals filed for this visit.   GEN- The patient is well appearing, alert and  oriented x 3 today.   HEENT: normocephalic, atraumatic; sclera clear, conjunctiva pink; hearing intact; oropharynx clear; neck supple, no JVP Lymph- no cervical lymphadenopathy Lungs- Clear to ausculation bilaterally, normal work of breathing.  No wheezes, rales, rhonchi Heart- {Blank single:19197::"Regular","Irregularly irregular"}  rate and rhythm, no murmurs, rubs or gallops, PMI not laterally displaced GI- soft, non-tender, non-distended, bowel sounds present, no hepatosplenomegaly Extremities- no clubbing or cyanosis. {EDEMA QGBEE:10071} peripheral edema; DP/PT/radial pulses 2+ bilaterally MS- no significant deformity or atrophy Skin- warm and dry, no rash or lesion; ICD pocket well healed Psych- euthymic mood, full affect Neuro- strength and sensation are intact  ICD interrogation- not performed today.  Barostim interrogation - Stable impedance, performed personally. See scanned report.   EKG:  EKG is not ordered today.   Recent Labs: 05/05/2021: TSH 0.492 10/23/2021: B Natriuretic Peptide 84.9 01/01/2022: ALT 18; BUN 8; Creatinine, Ser 0.76; Hemoglobin 11.6; Platelets 201; Potassium 3.9; Sodium 138   Wt Readings from Last 3 Encounters:  01/29/22 258 lb 14.4 oz (117.4 kg)  01/16/22 255 lb 11.2 oz (116 kg)  01/11/22 225 lb (102.1 kg)     Other studies Reviewed: Additional studies/ records that were reviewed today  include: Previous EP office notes.   Assessment and Plan:  1.  Chronic systolic dysfunction s/p St. Jude single chamber ICD  Barostim 01/11/2022 NYHA *** symptoms;  Device titrated from *** millamp to *** milliamp by increments of *** with good blood pressure response and no stimulation.  Device impedence stable.  She has goals of *** Normal device function See scanned report.  Will follow up in *** weeks to continue titration with goal of 6-8 milliamps for chronic settings.   2. PVCs Continue low dose amiodarone   Current medicines are reviewed at length with the patient today.   =  Labs/ tests ordered today include: *** No orders of the defined types were placed in this encounter.    Disposition:   Follow up with {EPMDS:28135} {Blank single:19197::"in 2 weeks","in 4 weeks","in 3 months","in 6 months","in 12 months","as usual post gen change"}    Signed, Annamaria Helling  01/30/2022 8:23 AM  [facility] [address] [place] [address] [phone] (office) [phone] (fax)

## 2022-01-31 ENCOUNTER — Encounter: Payer: Medicare Other | Admitting: Student

## 2022-01-31 NOTE — Progress Notes (Deleted)
Electrophysiology Office Note Date: 01/31/2022  ID:  Tara Bradshaw, DOB 09-06-1985, MRN [id number]  PCP: Patient, No Pcp Per Primary Cardiologist: None Electrophysiologist: Vickie Epley, MD   CC: Routine ICD follow-up  Tara Bradshaw is a 36 y.o. female seen today for Vickie Epley, MD for routine electrophysiology followup.   Pt presents today for post barostim visit. This is her initial visit.   She has had some post op site pain. ***  Her goal for titration is ***  Since last being seen in our clinic the patient reports doing ***.  she denies chest pain, palpitations, dyspnea, PND, orthopnea, nausea, vomiting, dizziness, syncope, edema, weight gain, or early satiety.     {He/she (caps):30048} has not had ICD shocks.   Device History: SJM  Single chamber ICD implanted 02/19/2019 Lead dislodgement > revision 04/16/2019  Barostim (Commercial)  01/11/2022 by Dr. Trula Slade   AAD Amiodarone goes back at least to 2020  Past Medical History:  Diagnosis Date   AICD (automatic cardioverter/defibrillator) present    Anemia    CHRONIC   CHF (congestive heart failure) (HCC)    Chronic systolic dysfunction of left ventricle    GERD (gastroesophageal reflux disease)    WITH PREGNANCY   Nonischemic cardiomyopathy (HCC)    Pneumonia    x 1   PVC's (premature ventricular contractions)    Sleep apnea    Past Surgical History:  Procedure Laterality Date   CARDIAC CATHETERIZATION     DILATION AND EVACUATION N/A 11/30/2016   Procedure: DILATATION AND EVACUATION;  Surgeon: Donnamae Jude, MD;  Location: [facility];  Service: Gynecology;  Laterality: N/A;   ICD IMPLANT N/A 02/19/2019   Procedure: ICD IMPLANT;  Surgeon: Thompson Grayer, MD;  Location: [facility];  Service: Cardiovascular;  Laterality: N/A;   LEAD REVISION/REPAIR N/A 04/16/2019   Procedure: LEAD REVISION/REPAIR;  Surgeon: Thompson Grayer, MD;  Location: [facility];  Service: Cardiovascular;   Laterality: N/A;   RIGHT/LEFT HEART CATH AND CORONARY ANGIOGRAPHY N/A 01/22/2019   Procedure: RIGHT/LEFT HEART CATH AND CORONARY ANGIOGRAPHY;  Surgeon: Jolaine Artist, MD;  Location: [facility];  Service: Cardiovascular;  Laterality: N/A;   WISDOM TOOTH EXTRACTION     NO ANESTHESIA    Current Outpatient Medications  Medication Sig Dispense Refill   acetaminophen (TYLENOL) 325 MG tablet Take 650 mg by mouth every 6 (six) hours as needed for mild pain or headache.     amLODipine (NORVASC) 10 MG tablet Take 1 tablet (10 mg total) by mouth daily. 90 tablet 3   cyclobenzaprine (FLEXERIL) 10 MG tablet Take 1 tablet (10 mg total) by mouth 3 (three) times daily as needed for muscle spasms. 10 tablet 0   dapagliflozin propanediol (FARXIGA) 10 MG TABS tablet Take 1 tablet (10 mg total) by mouth daily before breakfast. 90 tablet 3   furosemide (LASIX) 40 MG tablet Take 1 tablet (40 mg total) by mouth 2 (two) times daily. 180 tablet 3   HYDROcodone-acetaminophen (NORCO) 5-325 MG tablet Take 1 tablet by mouth every 6 (six) hours as needed for moderate pain. 10 tablet 0   levonorgestrel (MIRENA) 20 MCG/24HR IUD 1 each by Intrauterine route once.     meclizine (ANTIVERT) 25 MG tablet Take 1 tablet (25 mg total) by mouth 3 (three) times daily as needed for dizziness. 15 tablet 0   metoprolol succinate (TOPROL-XL) 25 MG 24 hr tablet Take 1 tablet (25 mg total) by mouth at bedtime.  30 tablet 6   NON FORMULARY Pt uses a cpap nightly     oxyCODONE-acetaminophen (PERCOCET) 5-325 MG tablet Take 1 tablet by mouth every 6 (six) hours as needed for severe pain. 15 tablet 0   oxyCODONE-acetaminophen (PERCOCET/ROXICET) 5-325 MG tablet Take 1 tablet by mouth every 6 (six) hours as needed for severe pain. 24 tablet 0   potassium chloride SA (KLOR-CON M) 20 MEQ tablet Take 1 tablet (20 mEq total) by mouth daily. 90 tablet 3   sacubitril-valsartan (ENTRESTO) 97-103 MG Take 1 tablet by mouth 2 (two) times daily.  180 tablet 3   Semaglutide, 1 MG/DOSE, (OZEMPIC, 1 MG/DOSE,) 4 MG/3ML SOPN Inject 1 mg into the skin once a week. 3 mL 11   spironolactone (ALDACTONE) 25 MG tablet Take 1 tablet (25 mg total) by mouth daily. 90 tablet 3   No current facility-administered medications for this visit.    Allergies:   Patient has no known allergies.   Social History: Social History   Socioeconomic History   Marital status: Widowed    Spouse name: Not on file   Number of children: Not on file   Years of education:  14   Highest education level: Not on file  Occupational History   Occupation: SECURITY OFFICER    Employer: [employer]  Tobacco Use   Smoking status: Never    Passive exposure: Never   Smokeless tobacco: Never  Vaping Use   Vaping Use: Never used  Substance and Sexual Activity   Alcohol use: No   Drug use: No   Sexual activity: Yes    Partners: Male    Birth control/protection: None  Other Topics Concern   Not on file  Social History Narrative   Lives in [place] with husband and children (6,1)   Unemployed    Previously worked in [employer].   Social Determinants of Health   Financial Resource Strain: Low Risk  (12/05/2017)   Overall Financial Resource Strain (CARDIA)    Difficulty of Paying Living Expenses: Not hard at all  Food Insecurity: No Food Insecurity (12/05/2017)   Hunger Vital Sign    Worried About Running Out of Food in the Last Year: Never true    Ran Out of Food in the Last Year: Never true  Transportation Needs: No Transportation Needs (12/05/2017)   PRAPARE - Hydrologist (Medical): No    Lack of Transportation (Non-Medical): No  Physical Activity: Inactive (12/05/2017)   Exercise Vital Sign    Days of Exercise per Week: 0 days    Minutes of Exercise per Session: 0 min  Stress: Not on file  Social Connections: Not on file  Intimate Partner Violence: Not At Risk (12/05/2017)   Humiliation, Afraid, Rape, and Kick  questionnaire    Fear of Current or Ex-Partner: No    Emotionally Abused: No    Physically Abused: No    Sexually Abused: No    Family History: Family History  Problem Relation Age of Onset   Epilepsy Mother    Asthma Mother    Cancer Paternal Grandmother        BREAST   Cancer Maternal Aunt 79       BREAST   Hypertension Maternal Grandmother     Review of Systems: All other systems reviewed and are otherwise negative except as noted above.   Physical Exam: There were no vitals filed for this visit.   GEN- The patient is well appearing, alert and  oriented x 3 today.   HEENT: normocephalic, atraumatic; sclera clear, conjunctiva pink; hearing intact; oropharynx clear; neck supple, no JVP Lymph- no cervical lymphadenopathy Lungs- Clear to ausculation bilaterally, normal work of breathing.  No wheezes, rales, rhonchi Heart- {Blank single:19197::"Regular","Irregularly irregular"}  rate and rhythm, no murmurs, rubs or gallops, PMI not laterally displaced GI- soft, non-tender, non-distended, bowel sounds present, no hepatosplenomegaly Extremities- no clubbing or cyanosis. {EDEMA WGNFA:21308} peripheral edema; DP/PT/radial pulses 2+ bilaterally MS- no significant deformity or atrophy Skin- warm and dry, no rash or lesion; ICD pocket well healed Psych- euthymic mood, full affect Neuro- strength and sensation are intact  ICD interrogation- not performed today.  Barostim interrogation - Stable impedance, performed personally. See scanned report.   EKG:  EKG is not ordered today.   Recent Labs: 05/05/2021: TSH 0.492 10/23/2021: B Natriuretic Peptide 84.9 01/01/2022: ALT 18; BUN 8; Creatinine, Ser 0.76; Hemoglobin 11.6; Platelets 201; Potassium 3.9; Sodium 138   Wt Readings from Last 3 Encounters:  01/29/22 258 lb 14.4 oz (117.4 kg)  01/16/22 255 lb 11.2 oz (116 kg)  01/11/22 225 lb (102.1 kg)     Other studies Reviewed: Additional studies/ records that were reviewed today  include: Previous EP office notes.   Assessment and Plan:  1.  Chronic systolic dysfunction s/p St. Jude single chamber ICD  Barostim 01/11/2022 NYHA *** symptoms;  Device titrated from *** millamp to *** milliamp by increments of *** with good blood pressure response and no stimulation.  Device impedence stable.  She has goals of *** Normal device function See scanned report.  Will follow up in *** weeks to continue titration with goal of 6-8 milliamps for chronic settings.   2. PVCs Continue low dose amiodarone   Current medicines are reviewed at length with the patient today.   =  Labs/ tests ordered today include: *** No orders of the defined types were placed in this encounter.    Disposition:   Follow up with {EPMDS:28135} {Blank single:19197::"in 2 weeks","in 4 weeks","in 3 months","in 6 months","in 12 months","as usual post gen change"}    Signed, Shirley Friar, PA-C  01/31/2022 6:57 PM  [place]  [address] [phone] (office) [phone] (fax)

## 2022-02-01 ENCOUNTER — Encounter: Payer: Medicare Other | Admitting: Student

## 2022-02-01 DIAGNOSIS — I5022 Chronic systolic (congestive) heart failure: Secondary | ICD-10-CM

## 2022-02-01 DIAGNOSIS — I493 Ventricular premature depolarization: Secondary | ICD-10-CM

## 2022-02-07 NOTE — Progress Notes (Signed)
Electrophysiology Office Note Date: 02/12/2022  ID:  Tara Bradshaw, DOB 04/23/1985, MRN [id number]  PCP: Patient, No Pcp Per Primary Cardiologist: None Electrophysiologist: Tara Epley, MD   CC: Routine ICD follow-up  Tara Bradshaw is a 36 y.o. female seen today for Tara Epley, MD for routine electrophysiology followup.   Pt presents today for post barostim visit. This is her initial visit.   She has had some post op site pain. She needs help with most chorse  Her goal for titration is more activities with the kids. Being able to play with them more without having to take a break. Son plays soccer and she can't practice with him. Chasing her 36 yo around. Loves cooking, but has to go for low effort meals.   She has not had ICD shocks.   Device History: SJM  Single chamber ICD implanted 02/19/2019 Lead dislodgement > revision 04/16/2019  Barostim (Commercial)  01/11/2022 by Dr. Trula Bradshaw   AAD Amiodarone goes back at least to 2020  Past Medical History:  Diagnosis Date   AICD (automatic cardioverter/defibrillator) present    Anemia    CHRONIC   CHF (congestive heart failure) (HCC)    Chronic systolic dysfunction of left ventricle    GERD (gastroesophageal reflux disease)    WITH PREGNANCY   Nonischemic cardiomyopathy (HCC)    Pneumonia    x 1   PVC's (premature ventricular contractions)    Sleep apnea    Past Surgical History:  Procedure Laterality Date   CARDIAC CATHETERIZATION     DILATION AND EVACUATION N/A 11/30/2016   Procedure: DILATATION AND EVACUATION;  Surgeon: Tara Jude, MD;  Location: [facility];  Service: Gynecology;  Laterality: N/A;   ICD IMPLANT N/A 02/19/2019   Procedure: ICD IMPLANT;  Surgeon: Tara Grayer, MD;  Location: [facility];  Service: Cardiovascular;  Laterality: N/A;   LEAD REVISION/REPAIR N/A 04/16/2019   Procedure: LEAD REVISION/REPAIR;  Surgeon: Tara Grayer, MD;  Location: [facility];  Service:  Cardiovascular;  Laterality: N/A;   RIGHT/LEFT HEART CATH AND CORONARY ANGIOGRAPHY N/A 01/22/2019   Procedure: RIGHT/LEFT HEART CATH AND CORONARY ANGIOGRAPHY;  Surgeon: Tara Artist, MD;  Location: [facility];  Service: Cardiovascular;  Laterality: N/A;   WISDOM TOOTH EXTRACTION     NO ANESTHESIA    Current Outpatient Medications  Medication Sig Dispense Refill   acetaminophen (TYLENOL) 325 MG tablet Take 650 mg by mouth every 6 (six) hours as needed for mild pain or headache.     amLODipine (NORVASC) 10 MG tablet Take 1 tablet (10 mg total) by mouth daily. 90 tablet 3   cyclobenzaprine (FLEXERIL) 10 MG tablet Take 1 tablet (10 mg total) by mouth 3 (three) times daily as needed for muscle spasms. 10 tablet 0   dapagliflozin propanediol (FARXIGA) 10 MG TABS tablet Take 1 tablet (10 mg total) by mouth daily before breakfast. 90 tablet 3   furosemide (LASIX) 40 MG tablet Take 1 tablet (40 mg total) by mouth 2 (two) times daily. 180 tablet 3   HYDROcodone-acetaminophen (NORCO) 5-325 MG tablet Take 1 tablet by mouth every 6 (six) hours as needed for moderate pain. 10 tablet 0   levonorgestrel (MIRENA) 20 MCG/24HR IUD 1 each by Intrauterine route once.     meclizine (ANTIVERT) 25 MG tablet Take 1 tablet (25 mg total) by mouth 3 (three) times daily as needed for dizziness. 15 tablet 0   metoprolol succinate (TOPROL-XL) 25 MG 24 hr  tablet Take 1 tablet (25 mg total) by mouth at bedtime. 30 tablet 6   NON FORMULARY Pt uses a cpap nightly     oxyCODONE-acetaminophen (PERCOCET) 5-325 MG tablet Take 1 tablet by mouth every 6 (six) hours as needed for severe pain. 15 tablet 0   oxyCODONE-acetaminophen (PERCOCET/ROXICET) 5-325 MG tablet Take 1 tablet by mouth every 6 (six) hours as needed for severe pain. 24 tablet 0   potassium chloride SA (KLOR-CON M) 20 MEQ tablet Take 1 tablet (20 mEq total) by mouth daily. 90 tablet 3   sacubitril-valsartan (ENTRESTO) 97-103 MG Take 1 tablet by mouth 2  (two) times daily. 180 tablet 3   Semaglutide, 1 MG/DOSE, (OZEMPIC, 1 MG/DOSE,) 4 MG/3ML SOPN Inject 1 mg into the skin once a week. 3 mL 11   spironolactone (ALDACTONE) 25 MG tablet Take 1 tablet (25 mg total) by mouth daily. 90 tablet 3   No current facility-administered medications for this visit.    Allergies:   Patient has no known allergies.   Social History: Social History   Socioeconomic History   Marital status: Widowed    Spouse name: Not on file   Number of children: Not on file   Years of education:  14   Highest education level: Not on file  Occupational History   Occupation: SECURITY OFFICER    Employer: [employer]  Tobacco Use   Smoking status: Never    Passive exposure: Never   Smokeless tobacco: Never  Vaping Use   Vaping Use: Never used  Substance and Sexual Activity   Alcohol use: No   Drug use: No   Sexual activity: Yes    Partners: Male    Birth control/protection: None  Other Topics Concern   Not on file  Social History Narrative   Lives in [place] with husband and children (6,1)   Unemployed    Previously worked in [employer].   Social Determinants of Health   Financial Resource Strain: Low Risk  (12/05/2017)   Overall Financial Resource Strain (CARDIA)    Difficulty of Paying Living Expenses: Not hard at all  Food Insecurity: No Food Insecurity (12/05/2017)   Hunger Vital Sign    Worried About Running Out of Food in the Last Year: Never true    Ran Out of Food in the Last Year: Never true  Transportation Needs: No Transportation Needs (12/05/2017)   PRAPARE - Hydrologist (Medical): No    Lack of Transportation (Non-Medical): No  Physical Activity: Inactive (12/05/2017)   Exercise Vital Sign    Days of Exercise per Week: 0 days    Minutes of Exercise per Session: 0 min  Stress: Not on file  Social Connections: Not on file  Intimate Partner Violence: Not At Risk (12/05/2017)   Humiliation,  Afraid, Rape, and Kick questionnaire    Fear of Current or Ex-Partner: No    Emotionally Abused: No    Physically Abused: No    Sexually Abused: No    Family History: Family History  Problem Relation Age of Onset   Epilepsy Mother    Asthma Mother    Cancer Paternal Grandmother        BREAST   Cancer Maternal Aunt 8       BREAST   Hypertension Maternal Grandmother     Review of Systems: All other systems reviewed and are otherwise negative except as noted above.   Physical Exam: There were no vitals filed for this  visit.   GEN- The patient is well appearing, alert and oriented x 3 today.   HEENT: normocephalic, atraumatic; sclera clear, conjunctiva pink; hearing intact; oropharynx clear; neck supple, no JVP Lymph- no cervical lymphadenopathy Lungs- Clear to ausculation bilaterally, normal work of breathing.  No wheezes, rales, rhonchi Heart- Regular  rate and rhythm, no murmurs, rubs or gallops, PMI not laterally displaced GI- soft, non-tender, non-distended, bowel sounds present, no hepatosplenomegaly Extremities- no clubbing or cyanosis. No peripheral edema; DP/PT/radial pulses 2+ bilaterally MS- no significant deformity or atrophy Skin- warm and dry, no rash or lesion; ICD pocket well healed Psych- euthymic mood, full affect Neuro- strength and sensation are intact  ICD interrogation- not performed today.  Barostim interrogation - Stable impedance, performed personally. See scanned report.   EKG:  EKG is not ordered today.   Recent Labs: 05/05/2021: TSH 0.492 10/23/2021: B Natriuretic Peptide 84.9 01/01/2022: ALT 18; BUN 8; Creatinine, Ser 0.76; Hemoglobin 11.6; Platelets 201; Potassium 3.9; Sodium 138   Wt Readings from Last 3 Encounters:  01/29/22 258 lb 14.4 oz (117.4 kg)  01/16/22 255 lb 11.2 oz (116 kg)  01/11/22 225 lb (102.1 kg)     Other studies Reviewed: Additional studies/ records that were reviewed today include: Previous EP office notes.    Assessment and Plan:  1.  Chronic systolic dysfunction s/p St. Bradshaw single chamber ICD  Barostim 01/11/2022 NYHA III symptoms;  Device titrated from 1.0 millamp to 3.2 milliamp by increments of 0.4 with good blood pressure response. She had stim at 4.6 with a shooting pain and aching into her ear, that resolved at 4.2 ma. She also felt the impedance check at each level Device impedence stable.  She has goals of increasing her functional status as above.  Normal device function See scanned report.  Will follow up in 2-4 weeks to continue titration with goal of 6-8 milliamps for chronic settings.   2. PVCs Continue low dose amiodarone  Current medicines are reviewed at length with the patient today.     Disposition:   Follow up with EP APP in 4 weeks    Signed, Shirley Friar, PA-C  02/12/2022 12:40 PM  [place] [address] [place] [address] [phone] (office) [phone] (fax)

## 2022-02-12 ENCOUNTER — Ambulatory Visit: Payer: Medicare Other | Attending: Student | Admitting: Student

## 2022-02-12 ENCOUNTER — Encounter: Payer: Self-pay | Admitting: Student

## 2022-02-12 VITALS — BP 122/74 | HR 87 | Ht 62.0 in | Wt 257.0 lb

## 2022-02-12 DIAGNOSIS — I5022 Chronic systolic (congestive) heart failure: Secondary | ICD-10-CM | POA: Diagnosis not present

## 2022-02-12 NOTE — Patient Instructions (Signed)
Medication Instructions:  Your physician recommends that you continue on your current medications as directed. Please refer to the Current Medication list given to you today.  *If you need a refill on your cardiac medications before your next appointment, please call your pharmacy*   Lab Work: None If you have labs (blood work) drawn today and your tests are completely normal, you will receive your results only by: [facility] (if you have MyChart) OR A paper copy in the mail If you have any lab test that is abnormal or we need to change your treatment, we will call you to review the results.   Follow-Up: At [facility], you and your health needs are our priority.  As part of our continuing mission to provide you with exceptional heart care, we have created designated Provider Care Teams.  These Care Teams include your primary Cardiologist (physician) and Advanced Practice Providers (APPs -  Physician Assistants and Nurse Practitioners) who all work together to provide you with the care you need, when you need it.   Your next appointment:   03/15/22  Important Information About Sugar

## 2022-02-25 ENCOUNTER — Telehealth: Payer: Self-pay | Admitting: Student in an Organized Health Care Education/Training Program

## 2022-02-25 NOTE — Telephone Encounter (Signed)
Paged by operator.   Patient was reporting ongoing issues with mild pain and ear fullness that she thought was related to the Barostim.  She put her finger in her ear and then subsequently put a piece of tissue in her ear which had some blood on it.  She is not having any ongoing active bleeding.  She will reach out to the clinic on Monday for follow-up and potential device changes.  I do not think that the small amount of bleeding on the tissue is related to the Barostim.  If symptoms worsen or bleeding recurs then she will seek further evaluation.

## 2022-02-26 ENCOUNTER — Ambulatory Visit (INDEPENDENT_AMBULATORY_CARE_PROVIDER_SITE_OTHER): Payer: Medicare Other

## 2022-02-26 DIAGNOSIS — I5022 Chronic systolic (congestive) heart failure: Secondary | ICD-10-CM | POA: Diagnosis not present

## 2022-02-26 DIAGNOSIS — Z95810 Presence of automatic (implantable) cardiac defibrillator: Secondary | ICD-10-CM

## 2022-02-28 ENCOUNTER — Telehealth: Payer: Self-pay

## 2022-02-28 NOTE — Telephone Encounter (Signed)
Remote ICM transmission received.  Attempted call to patient regarding ICM remote transmission and no answer.  

## 2022-02-28 NOTE — Progress Notes (Signed)
EPIC Encounter for ICM Monitoring  Patient Name: Tara Bradshaw is a 36 y.o. female Date: 02/28/2022 Primary Care Physican: Patient, No Pcp Per Primary Cardiologist: [place] Electrophysiologist: Quentin Ore 10/23/2021 Office Weight: 263 lbs   01/22/2022 Weight: 255 lbs                  Attempted call to patient and unable to reach. Transmission reviewed.    [place] thoracic impedance normal but was suggesting possible fluid accumulation from 10/31-11/14.    Prescribed: Furosemide 40 mg take 1 tablet by mouth twice a day. Potassium 20 mEq take 1 tablet daily.    Labs: 01/01/2022 Creatinine 0.76, BUN 8,   Potassium 3.9, Sodium 138, GFR >60 10/23/2021 Creatinine 0.53, BUN 6,   Potassium 3.9, Sodium 136, GFR >60 05/05/2021 Creatinine 0.69, BUN 9,   Potassium 4.4, Sodium 138, GFR 115 A complete set of results can be found in Results Review.   Recommendations:  Unable to reach.     Follow-up plan: ICM clinic phone appointment on 04/03/2022.   91 day device clinic remote transmission 06/01/2022.       EP/Cardiology Office Visits: Recall 04/21/2022 with Dr Haroldine Laws.   03/15/2022 with Oda Kilts, PA.   Copy of ICM check sent to Dr. Quentin Ore.   3 month ICM trend: 02/26/2022.    12-14 Month ICM trend:     [name], RN 02/28/2022 7:01 AM

## 2022-03-05 ENCOUNTER — Ambulatory Visit (INDEPENDENT_AMBULATORY_CARE_PROVIDER_SITE_OTHER): Payer: 59

## 2022-03-05 DIAGNOSIS — I428 Other cardiomyopathies: Secondary | ICD-10-CM | POA: Diagnosis not present

## 2022-03-05 LAB — CUP PACEART REMOTE DEVICE CHECK
Battery Remaining Longevity: 78 mo
Battery Remaining Percentage: 71 %
Battery Voltage: 2.99 V
Brady Statistic RV Percent Paced: 1 %
Date Time Interrogation Session: 20231126210056
HighPow Impedance: 69 Ohm
Implantable Lead Connection Status: 753985
Implantable Lead Implant Date: 20210107
Implantable Lead Location: 753860
Implantable Pulse Generator Implant Date: 20201112
Lead Channel Impedance Value: 330 Ohm
Lead Channel Pacing Threshold Amplitude: 1.75 V
Lead Channel Pacing Threshold Pulse Width: 0.5 ms
Lead Channel Sensing Intrinsic Amplitude: 11.9 mV
Lead Channel Setting Pacing Amplitude: 3.5 V
Lead Channel Setting Pacing Pulse Width: 0.5 ms
Lead Channel Setting Sensing Sensitivity: 0.5 mV
Pulse Gen Serial Number: 111012702
Zone Setting Status: 755011

## 2022-03-06 ENCOUNTER — Telehealth: Payer: Self-pay | Admitting: Cardiology

## 2022-03-06 ENCOUNTER — Telehealth: Payer: Self-pay

## 2022-03-06 NOTE — Telephone Encounter (Signed)
Pt called stating that she was having some issues with her Barostim. She was c/o soreness in her chest, shoulders, and neck for the past 3 days.  Reviewed pt's chart, returned call for clarification, two identifiers used. Pt recently had an adjustment to the settings. Instructed pt to call the cardiology office to inform them of the new symptoms and evaluate for possible setting adjustments. Confirmed understanding.

## 2022-03-06 NOTE — Telephone Encounter (Signed)
Pt states that she is sore at the side of procedure. Pt would like to know if there is something that can be prescribed for the discomfort. Please advise

## 2022-03-07 ENCOUNTER — Encounter: Payer: Medicare Other | Admitting: Physician Assistant

## 2022-03-07 ENCOUNTER — Ambulatory Visit: Payer: Medicare Other | Attending: Cardiology | Admitting: Student

## 2022-03-07 VITALS — BP 122/84

## 2022-03-07 DIAGNOSIS — I5022 Chronic systolic (congestive) heart failure: Secondary | ICD-10-CM | POA: Diagnosis not present

## 2022-03-07 NOTE — Telephone Encounter (Signed)
Device Clinic apt made 03/07/22 @ 12:30 for Tara Bradshaw to see.

## 2022-03-07 NOTE — Progress Notes (Signed)
Patient added on for discomfort and throbbing at barostim site.   Pt states has been gradually worsening x 4 says. Has throbbing in neck as well as mild tenderness at pocket site.   Pocket appears stable. No drainage or bleeding. No inflammation or fever.   Pt does feel like she has noticed an improvement in her fatigue since she was programmed.   Pt pulse width decreased to 65 ms with near immediate relief to the throbbing sensation in her neck.   No improvement to her site pain, which she compares to a similar discomfort she has chronically and intermittently had at her ICD site.   Device on arrival programmed at 3.2 millamp at 125 [place]. Pulse width decreased to 65 [place].   She had recurrent stim at 4.4 ma @ 65 [place], this did not completely resolve at 3.4 ma @ 65 [place] and resolved immediately when she was turned down further to 3.0 ma @ 65 [place].  Pt continues to feel the impedance check at each level.   She will keep her appointment for next week.   Today's Vitals   03/07/22 1252  BP: 122/84   General: Pleasant, NAD. No resp difficulty Psych: Normal affect. HEENT:  Normal, without mass or lesion.         Neck: Supple, no bruits or JVD. Carotids 2+. No lymphadenopathy/thyromegaly appreciated. Heart: PMI nondisplaced. RRR no s3, s4, or murmurs. Lungs:  Resp regular and unlabored, CTA. Abdomen: Soft, non-tender, non-distended, No HSM, BS + x 4.   Extremities: No clubbing, cyanosis or edema. DP/PT/Radials 2+ and equal bilaterally. Neuro: Alert and oriented X 3. Moves all extremities spontaneously.  Skin: Barostim pocket stable. No streaking, redness, or heat.   Legrand Como [address]" [place], PA-C  03/07/2022 12:49 PM

## 2022-03-08 ENCOUNTER — Telehealth: Payer: Self-pay | Admitting: *Deleted

## 2022-03-08 ENCOUNTER — Other Ambulatory Visit: Payer: Self-pay | Admitting: Physician Assistant

## 2022-03-08 MED ORDER — HYDROCODONE-ACETAMINOPHEN 5-325 MG PO TABS: 1.0000 | ORAL_TABLET | Freq: Four times a day (QID) | ORAL | 0 refills | Status: DC | PRN

## 2022-03-08 NOTE — Telephone Encounter (Signed)
Patient called requesting pain medication 03/07/2022.  Barostim placement was 01/11/2022. Contacted [place], PA to discuss with Dr Trula Slade.

## 2022-03-12 ENCOUNTER — Ambulatory Visit (INDEPENDENT_AMBULATORY_CARE_PROVIDER_SITE_OTHER): Payer: Medicare Other | Admitting: Surgery

## 2022-03-12 ENCOUNTER — Encounter: Payer: Self-pay | Admitting: Surgery

## 2022-03-12 VITALS — BP 112/80 | HR 79 | Temp 98.0°F | Resp 20 | Ht 62.0 in | Wt 257.0 lb

## 2022-03-12 DIAGNOSIS — I5022 Chronic systolic (congestive) heart failure: Secondary | ICD-10-CM

## 2022-03-12 MED ORDER — GABAPENTIN 300 MG PO CAPS: 300.0000 mg | ORAL_CAPSULE | Freq: Every day | ORAL | 1 refills | Status: DC

## 2022-03-12 NOTE — Progress Notes (Signed)
Patient name: Tara Bradshaw MRN: [id number] DOB: [birth year] Sex: female  REASON FOR VISIT:     Post op  HISTORY OF PRESENT ILLNESS:   Tara Bradshaw is a 36 y.o. female who underwent Barostim implant on 01/11/2022.  I saw her a few weeks later and she was complaining of numbness swelling and pain around her incision.  Sometimes this radiated up to her ear.  Now she is back with pain and discomfort around the pocket which is associated with hypersensitivity.  CURRENT MEDICATIONS:    Current Outpatient Medications  Medication Sig Dispense Refill   acetaminophen (TYLENOL) 325 MG tablet Take 650 mg by mouth every 6 (six) hours as needed for mild pain or headache.     amLODipine (NORVASC) 10 MG tablet Take 1 tablet (10 mg total) by mouth daily. 90 tablet 3   cyclobenzaprine (FLEXERIL) 10 MG tablet Take 1 tablet (10 mg total) by mouth 3 (three) times daily as needed for muscle spasms. 10 tablet 0   dapagliflozin propanediol (FARXIGA) 10 MG TABS tablet Take 1 tablet (10 mg total) by mouth daily before breakfast. 90 tablet 3   furosemide (LASIX) 40 MG tablet Take 1 tablet (40 mg total) by mouth 2 (two) times daily. 180 tablet 3   levonorgestrel (MIRENA) 20 MCG/24HR IUD 1 each by Intrauterine route once.     meclizine (ANTIVERT) 25 MG tablet Take 1 tablet (25 mg total) by mouth 3 (three) times daily as needed for dizziness. 15 tablet 0   metoprolol succinate (TOPROL-XL) 25 MG 24 hr tablet Take 1 tablet (25 mg total) by mouth at bedtime. 30 tablet 6   NON FORMULARY Pt uses a cpap nightly     potassium chloride SA (KLOR-CON M) 20 MEQ tablet Take 1 tablet (20 mEq total) by mouth daily. 90 tablet 3   sacubitril-valsartan (ENTRESTO) 97-103 MG Take 1 tablet by mouth 2 (two) times daily. 180 tablet 3   Semaglutide, 1 MG/DOSE, (OZEMPIC, 1 MG/DOSE,) 4 MG/3ML SOPN Inject 1 mg into the skin once a week. 3 mL 11   spironolactone (ALDACTONE) 25 MG tablet Take 1 tablet (25 mg  total) by mouth daily. 90 tablet 3   HYDROcodone-acetaminophen (NORCO) 5-325 MG tablet Take 1 tablet by mouth every 6 (six) hours as needed for severe pain. (Patient not taking: Reported on 03/12/2022) 10 tablet 0   No current facility-administered medications for this visit.    REVIEW OF SYSTEMS:   '[X]'$  denotes positive finding, '[ ]'$  denotes negative finding Cardiac  Comments:  Chest pain or chest pressure:    Shortness of breath upon exertion:    Short of breath when lying flat:    Irregular heart rhythm:    Constitutional    Fever or chills:      PHYSICAL EXAM:   Vitals:   03/12/22 1234  BP: 112/80  Pulse: 79  Resp: 20  Temp: 98 F (36.7 C)  SpO2: 98%  Weight: 257 lb (116.6 kg)  Height: '5\' 2"'$  (1.575 m)    GENERAL: The patient is a well-nourished female, in no acute distress. The vital signs are documented above. CARDIOVASCULAR: There is a regular rate and rhythm. PULMONARY: Non-labored respirations I looked at the pocket incision with ultrasound and did not see an obvious fluid collection  STUDIES:   None   MEDICAL ISSUES:   She continues to have pain with her pocket incision.  This appears to be more of a neuropathic type issue as it is hypersensitive.  It  does wake her up at night.  I did not see a fluid collection on ultrasound.  There is no evidence of infection.  Hopefully, this is just a neuropathic issue that will resolve as her scar tissue softens up which should hopefully be within the next several months.  I am going to give her a prescription for gabapentin to see if this helps at all.  She will follow-up with me in 3 months  Leia Alf, MD, FACS [facility] [phone] Pager [phone]

## 2022-03-14 NOTE — Progress Notes (Deleted)
Electrophysiology Office Note Date: 03/14/2022  ID:  Tara Bradshaw, DOB Feb 26, 1986, MRN [id number]  PCP: Patient, No Pcp Per Primary Cardiologist: None Electrophysiologist: Vickie Epley, MD   CC: Routine ICD follow-up  Tara Bradshaw is a 36 y.o. female seen today for Vickie Epley, MD for routine electrophysiology followup.   Pt presents today for post barostim visit.   At initial visit, device titrated from 1.0 millamp to 3.2 milliamp by increments of 0.4 with good blood pressure response. She had stim at 4.6 with a shooting pain and aching into her ear, that resolved at 4.2 ma. She also felt the impedance check at each level  She came back early for follow up with discomfort in her neck. Pulse width decreased to 65 [place] with immediate relief to the throbbing sensation in her neck.   She had recurrent stim at 4.4 ma @ 65 [place], this did not completely resolve at 3.4 ma @ 65 [place], but resolved immediately when she was turned down further to 3.0 ma @ 65 [place].  ***  Her goal for titration is more activities with the kids. Being able to play with them more without having to take a break. Son plays soccer and she can't practice with him. Chasing her 36 yo around. Loves cooking, but has to go for low effort meals.   She has not had ICD shocks.   Device History: SJM  Single chamber ICD implanted 02/19/2019 Lead dislodgement > revision 04/16/2019  Barostim (Commercial)  01/11/2022 by Dr. Trula Slade   AAD Amiodarone goes back at least to 2020  Past Medical History:  Diagnosis Date   AICD (automatic cardioverter/defibrillator) present    Anemia    CHRONIC   CHF (congestive heart failure) (HCC)    Chronic systolic dysfunction of left ventricle    GERD (gastroesophageal reflux disease)    WITH PREGNANCY   Nonischemic cardiomyopathy (HCC)    Pneumonia    x 1   PVC's (premature ventricular contractions)    Sleep apnea    Past Surgical History:  Procedure Laterality Date    CARDIAC CATHETERIZATION     DILATION AND EVACUATION N/A 11/30/2016   Procedure: DILATATION AND EVACUATION;  Surgeon: Donnamae Jude, MD;  Location: [facility];  Service: Gynecology;  Laterality: N/A;   ICD IMPLANT N/A 02/19/2019   Procedure: ICD IMPLANT;  Surgeon: Thompson Grayer, MD;  Location: [facility];  Service: Cardiovascular;  Laterality: N/A;   LEAD REVISION/REPAIR N/A 04/16/2019   Procedure: LEAD REVISION/REPAIR;  Surgeon: Thompson Grayer, MD;  Location: [facility];  Service: Cardiovascular;  Laterality: N/A;   RIGHT/LEFT HEART CATH AND CORONARY ANGIOGRAPHY N/A 01/22/2019   Procedure: RIGHT/LEFT HEART CATH AND CORONARY ANGIOGRAPHY;  Surgeon: Jolaine Artist, MD;  Location: [facility];  Service: Cardiovascular;  Laterality: N/A;   WISDOM TOOTH EXTRACTION     NO ANESTHESIA    Current Outpatient Medications  Medication Sig Dispense Refill   acetaminophen (TYLENOL) 325 MG tablet Take 650 mg by mouth every 6 (six) hours as needed for mild pain or headache.     amLODipine (NORVASC) 10 MG tablet Take 1 tablet (10 mg total) by mouth daily. 90 tablet 3   cyclobenzaprine (FLEXERIL) 10 MG tablet Take 1 tablet (10 mg total) by mouth 3 (three) times daily as needed for muscle spasms. 10 tablet 0   dapagliflozin propanediol (FARXIGA) 10 MG TABS tablet Take 1 tablet (10 mg total) by mouth daily before breakfast. 90  tablet 3   furosemide (LASIX) 40 MG tablet Take 1 tablet (40 mg total) by mouth 2 (two) times daily. 180 tablet 3   gabapentin (NEURONTIN) 300 MG capsule Take 1 capsule (300 mg total) by mouth daily. 30 capsule 1   HYDROcodone-acetaminophen (NORCO) 5-325 MG tablet Take 1 tablet by mouth every 6 (six) hours as needed for severe pain. (Patient not taking: Reported on 03/12/2022) 10 tablet 0   levonorgestrel (MIRENA) 20 MCG/24HR IUD 1 each by Intrauterine route once.     meclizine (ANTIVERT) 25 MG tablet Take 1 tablet (25 mg total) by mouth 3 (three) times daily as needed  for dizziness. 15 tablet 0   metoprolol succinate (TOPROL-XL) 25 MG 24 hr tablet Take 1 tablet (25 mg total) by mouth at bedtime. 30 tablet 6   NON FORMULARY Pt uses a cpap nightly     potassium chloride SA (KLOR-CON M) 20 MEQ tablet Take 1 tablet (20 mEq total) by mouth daily. 90 tablet 3   sacubitril-valsartan (ENTRESTO) 97-103 MG Take 1 tablet by mouth 2 (two) times daily. 180 tablet 3   Semaglutide, 1 MG/DOSE, (OZEMPIC, 1 MG/DOSE,) 4 MG/3ML SOPN Inject 1 mg into the skin once a week. 3 mL 11   spironolactone (ALDACTONE) 25 MG tablet Take 1 tablet (25 mg total) by mouth daily. 90 tablet 3   No current facility-administered medications for this visit.    Allergies:   Patient has no known allergies.   Social History: Social History   Socioeconomic History   Marital status: Widowed    Spouse name: Not on file   Number of children: Not on file   Years of education:  14   Highest education level: Not on file  Occupational History   Occupation: SECURITY OFFICER    Employer: [employer]  Tobacco Use   Smoking status: Never    Passive exposure: Never   Smokeless tobacco: Never  Vaping Use   Vaping Use: Never used  Substance and Sexual Activity   Alcohol use: No   Drug use: No   Sexual activity: Yes    Partners: Male    Birth control/protection: None  Other Topics Concern   Not on file  Social History Narrative   Lives in [place] with husband and children (6,1)   Unemployed    Previously worked in [employer]   Social Determinants of Health   Financial Resource Strain: Low Risk  (12/05/2017)   Overall Financial Resource Strain (CARDIA)    Difficulty of Paying Living Expenses: Not hard at all  Food Insecurity: No Food Insecurity (12/05/2017)   Hunger Vital Sign    Worried About Running Out of Food in the Last Year: Never true    Ran Out of Food in the Last Year: Never true  Transportation Needs: No Transportation Needs (12/05/2017)   PRAPARE -  Hydrologist (Medical): No    Lack of Transportation (Non-Medical): No  Physical Activity: Inactive (12/05/2017)   Exercise Vital Sign    Days of Exercise per Week: 0 days    Minutes of Exercise per Session: 0 min  Stress: Not on file  Social Connections: Not on file  Intimate Partner Violence: Not At Risk (12/05/2017)   Humiliation, Afraid, Rape, and Kick questionnaire    Fear of Current or Ex-Partner: No    Emotionally Abused: No    Physically Abused: No    Sexually Abused: No    Family History: Family History  Problem  Relation Age of Onset   Epilepsy Mother    Asthma Mother    Cancer Paternal Grandmother        BREAST   Cancer Maternal Aunt 81       BREAST   Hypertension Maternal Grandmother     Review of Systems: Review of systems complete and found to be negative unless listed in HPI.     Physical Exam: There were no vitals filed for this visit.   General: Pleasant, NAD. No resp difficulty Psych: Normal affect. HEENT:  Normal, without mass or lesion.         Neck: Supple, no bruits or JVD. Carotids 2+. No lymphadenopathy/thyromegaly appreciated. Heart: PMI nondisplaced. RRR no s3, s4, or murmurs. Lungs:  Resp regular and unlabored, CTA. Abdomen: Soft, non-tender, non-distended, No HSM, BS + x 4.   Extremities: No clubbing, cyanosis or edema. DP/PT/Radials 2+ and equal bilaterally. Neuro: Alert and oriented X 3. Moves all extremities spontaneously.   ICD interrogation- not performed today.  Barostim interrogation - Stable impedance, performed personally today. See scanned report.   EKG:  EKG is not ordered today.   Recent Labs: 05/05/2021: TSH 0.492 10/23/2021: B Natriuretic Peptide 84.9 01/01/2022: ALT 18; BUN 8; Creatinine, Ser 0.76; Hemoglobin 11.6; Platelets 201; Potassium 3.9; Sodium 138   Wt Readings from Last 3 Encounters:  03/12/22 257 lb (116.6 kg)  02/12/22 257 lb (116.6 kg)  01/29/22 258 lb 14.4 oz (117.4 kg)      Other studies Reviewed: Additional studies/ records that were reviewed today include: Previous EP office notes.   Assessment and Plan:  1.  Chronic systolic dysfunction s/p St. Jude single chamber ICD  Barostim 01/11/2022 NYHA III symptoms;  Pulse width decreased to 65 [place] at prior visit Device titrated from 3.0 ma @ 65 [place] to *** Device impedence stable She has goals of increasing her functional status as above.  Normal device function See scanned report.   2. PVCs Continue low dose amiodarone  Current medicines are reviewed at length with the patient today.     Disposition:   Follow up with EP APP in 4 weeks    Signed, Shirley Friar, PA-C  03/14/2022 9:39 AM  [facility] [address] [place] [address] [phone] (office) [phone] (fax)

## 2022-03-15 ENCOUNTER — Ambulatory Visit: Payer: Medicare Other | Admitting: Student

## 2022-03-15 DIAGNOSIS — I493 Ventricular premature depolarization: Secondary | ICD-10-CM

## 2022-03-15 DIAGNOSIS — I5022 Chronic systolic (congestive) heart failure: Secondary | ICD-10-CM

## 2022-03-16 ENCOUNTER — Other Ambulatory Visit (HOSPITAL_COMMUNITY): Payer: Self-pay | Admitting: *Deleted

## 2022-03-16 MED ORDER — MECLIZINE HCL 25 MG PO TABS: 25.0000 mg | ORAL_TABLET | Freq: Three times a day (TID) | ORAL | 0 refills | Status: DC | PRN

## 2022-04-03 ENCOUNTER — Ambulatory Visit (INDEPENDENT_AMBULATORY_CARE_PROVIDER_SITE_OTHER): Payer: Medicare Other

## 2022-04-03 DIAGNOSIS — I5022 Chronic systolic (congestive) heart failure: Secondary | ICD-10-CM | POA: Diagnosis not present

## 2022-04-03 DIAGNOSIS — Z95810 Presence of automatic (implantable) cardiac defibrillator: Secondary | ICD-10-CM | POA: Diagnosis not present

## 2022-04-04 NOTE — Progress Notes (Signed)
EPIC Encounter for ICM Monitoring  Patient Name: Tara Bradshaw is a 36 y.o. female Date: 04/04/2022 Primary Care Physican: Patient, No Pcp Per Primary Cardiologist: [place] Electrophysiologist: Quentin Ore 10/23/2021 Office Weight: 263 lbs   01/22/2022 Weight: 255 lbs                   Transmission reviewed   [place]vue thoracic impedance suggesting normal fluid levels    Prescribed: Furosemide 40 mg take 1 tablet by mouth twice a day. Potassium 20 mEq take 1 tablet daily.    Labs: 01/01/2022 Creatinine 0.76, BUN 8,   Potassium 3.9, Sodium 138, GFR >60 10/23/2021 Creatinine 0.53, BUN 6,   Potassium 3.9, Sodium 136, GFR >60 05/05/2021 Creatinine 0.69, BUN 9,   Potassium 4.4, Sodium 138, GFR 115 A complete set of results can be found in Results Review.   Recommendations:  No changes.    Follow-up plan: ICM clinic phone appointment on 05/07/2022.   91 day device clinic remote transmission 06/01/2022.       EP/Cardiology Office Visits:  Recall 04/21/2022 with HF clinic.  Recall 10/18/2022 with Tommye Standard, PA.   Copy of ICM check sent to Dr. Quentin Ore.   3 month ICM trend: 04/02/2022.    12-14 Month ICM trend:     [name], RN 04/04/2022 7:34 AM

## 2022-04-10 DIAGNOSIS — G4733 Obstructive sleep apnea (adult) (pediatric): Secondary | ICD-10-CM | POA: Diagnosis not present

## 2022-04-11 ENCOUNTER — Emergency Department (HOSPITAL_COMMUNITY): Payer: Medicare Other

## 2022-04-11 ENCOUNTER — Ambulatory Visit (HOSPITAL_COMMUNITY)
Admission: EM | Admit: 2022-04-11 | Discharge: 2022-04-11 | Disposition: A | Discharge status: Home / Self Care | Payer: Medicare Other | Attending: Family Medicine | Admitting: Family Medicine

## 2022-04-11 ENCOUNTER — Encounter (HOSPITAL_COMMUNITY): Payer: Self-pay | Admitting: Emergency Medicine

## 2022-04-11 ENCOUNTER — Encounter (HOSPITAL_COMMUNITY): Payer: Self-pay

## 2022-04-11 ENCOUNTER — Telehealth: Payer: Self-pay | Admitting: Cardiology

## 2022-04-11 ENCOUNTER — Emergency Department (HOSPITAL_COMMUNITY)
Admission: EM | Admit: 2022-04-11 | Discharge: 2022-04-11 | Discharge status: Against Medical Advice | Payer: Medicare Other | Attending: Emergency Medicine | Admitting: Emergency Medicine

## 2022-04-11 DIAGNOSIS — R0602 Shortness of breath: Secondary | ICD-10-CM | POA: Diagnosis not present

## 2022-04-11 DIAGNOSIS — R000 Tachycardia, unspecified: Secondary | ICD-10-CM | POA: Diagnosis not present

## 2022-04-11 DIAGNOSIS — Z5321 Procedure and treatment not carried out due to patient leaving prior to being seen by health care provider: Secondary | ICD-10-CM | POA: Diagnosis not present

## 2022-04-11 DIAGNOSIS — R0689 Other abnormalities of breathing: Secondary | ICD-10-CM | POA: Diagnosis not present

## 2022-04-11 DIAGNOSIS — Z1152 Encounter for screening for COVID-19: Secondary | ICD-10-CM | POA: Diagnosis not present

## 2022-04-11 DIAGNOSIS — R059 Cough, unspecified: Secondary | ICD-10-CM | POA: Insufficient documentation

## 2022-04-11 DIAGNOSIS — R110 Nausea: Secondary | ICD-10-CM | POA: Diagnosis not present

## 2022-04-11 DIAGNOSIS — R001 Bradycardia, unspecified: Secondary | ICD-10-CM | POA: Diagnosis not present

## 2022-04-11 DIAGNOSIS — I509 Heart failure, unspecified: Secondary | ICD-10-CM | POA: Diagnosis not present

## 2022-04-11 DIAGNOSIS — I10 Essential (primary) hypertension: Secondary | ICD-10-CM | POA: Diagnosis not present

## 2022-04-11 LAB — BASIC METABOLIC PANEL
Anion gap: 8 (ref 5–15)
BUN: 6 mg/dL (ref 6–20)
CO2: 21 mmol/L — ABNORMAL LOW (ref 22–32)
Calcium: 8.6 mg/dL — ABNORMAL LOW (ref 8.9–10.3)
Chloride: 108 mmol/L (ref 98–111)
Creatinine, Ser: 0.62 mg/dL (ref 0.44–1.00)
GFR, Estimated: >60 mL/min (ref >60)
Glucose, Bld: 95 mg/dL (ref 70–99)
Potassium: 3.3 mmol/L — ABNORMAL LOW (ref 3.5–5.1)
Sodium: 137 mmol/L (ref 135–145)

## 2022-04-11 LAB — CBC WITH DIFFERENTIAL/PLATELET
Abs Immature Granulocytes: 0.05 10*3/uL (ref 0.00–0.07)
Basophils Absolute: 0 10*3/uL (ref 0.0–0.1)
Basophils Relative: 0 %
Eosinophils Absolute: 0.2 10*3/uL (ref 0.0–0.5)
Eosinophils Relative: 2 %
HCT: 34.6 % — ABNORMAL LOW (ref 36.0–46.0)
Hemoglobin: 11.4 g/dL — ABNORMAL LOW (ref 12.0–15.0)
Immature Granulocytes: 1 %
Lymphocytes Relative: 31 %
Lymphs Abs: 3.3 10*3/uL (ref 0.7–4.0)
MCH: 27.1 pg (ref 26.0–34.0)
MCHC: 32.9 g/dL (ref 30.0–36.0)
MCV: 82.2 fL (ref 80.0–100.0)
Monocytes Absolute: 0.6 10*3/uL (ref 0.1–1.0)
Monocytes Relative: 5 %
Neutro Abs: 6.5 10*3/uL (ref 1.7–7.7)
Neutrophils Relative %: 61 %
Platelets: 217 10*3/uL (ref 150–400)
RBC: 4.21 MIL/uL (ref 3.87–5.11)
RDW: 13.9 % (ref 11.5–15.5)
WBC: 10.6 10*3/uL — ABNORMAL HIGH (ref 4.0–10.5)
nRBC: 0 % (ref 0.0–0.2)

## 2022-04-11 LAB — RESP PANEL BY RT-PCR (RSV, FLU A&B, COVID)  RVPGX2
Influenza A by PCR: NEGATIVE
Influenza B by PCR: NEGATIVE
Resp Syncytial Virus by PCR: NEGATIVE
SARS Coronavirus 2 by RT PCR: NEGATIVE

## 2022-04-11 LAB — BRAIN NATRIURETIC PEPTIDE: B Natriuretic Peptide: 955.1 pg/mL — ABNORMAL HIGH (ref 0.0–100.0)

## 2022-04-11 NOTE — ED Notes (Signed)
Pt told me they were leaving bc of wait time; going to urgent care

## 2022-04-11 NOTE — Telephone Encounter (Signed)
Spoke with the patient who states that she went to the ER this morning due to worsening shortness of breath. She had labs and xray done and was told it was going to be a 15 hour wait. She went over to the urgent care and they reviewed ER workup. They increased her Lasix and potassium for three days. She was told to follow up with cardiology ASAP. She tried to get in at CHF clinic but was unable to get an appointment. I scheduled her to see DOD on 1/5, she is aware to return to ER for any worsening symptoms. Will also route to device clinic for interrogation.

## 2022-04-11 NOTE — ED Provider Notes (Signed)
[facility]    CSN: [id number] Arrival date & time: 04/11/22  1436      History   Chief Complaint Chief Complaint  Patient presents with   Cough   Shortness of Breath    HPI Tara Bradshaw is a 37 y.o. female.    Cough Associated symptoms: shortness of breath   Shortness of Breath Associated symptoms: cough    Here for dyspnea that is been going on since about December 24.  No fever or chills.  She has had some rhinorrhea and a little cough.  No wheezing currently  She has had a history of heart failure and has an AICD placed.  She already takes Lasix and potassium daily  Past Medical History:  Diagnosis Date   AICD (automatic cardioverter/defibrillator) present    Anemia    CHRONIC   CHF (congestive heart failure) (HCC)    Chronic systolic dysfunction of left ventricle    GERD (gastroesophageal reflux disease)    WITH PREGNANCY   Nonischemic cardiomyopathy (HCC)    Pneumonia    x 1   PVC's (premature ventricular contractions)    Sleep apnea     Patient Active Problem List   Diagnosis Date Noted   Adjustment disorder with mixed anxiety and depressed mood 10/24/2019   Cardiomyopathy ([place]) 04/06/2019   ICD (implantable cardioverter-defibrillator) in place 04/06/2019   Subclinical hyperthyroidism 07/05/2018   Abnormal transaminases 07/05/2018   Acute congestive heart failure ([place]) 07/03/2018   Anemia, postpartum 12/13/2017   Normal postpartum course 12/13/2017   Indication for care in labor or delivery 12/11/2017   Hyperemesis affecting pregnancy, antepartum 07/25/2017   Dehydration during pregnancy 07/23/2017   Morbid obesity ([place]) 11/26/2016   GERD (gastroesophageal reflux disease) 11/26/2016    Past Surgical History:  Procedure Laterality Date   CARDIAC CATHETERIZATION     DILATION AND EVACUATION N/A 11/30/2016   Procedure: DILATATION AND EVACUATION;  Surgeon: Donnamae Jude, MD;  Location: [facility];  Service: Gynecology;  Laterality: N/A;    ICD IMPLANT N/A 02/19/2019   Procedure: ICD IMPLANT;  Surgeon: Thompson Grayer, MD;  Location: [facility];  Service: Cardiovascular;  Laterality: N/A;   LEAD REVISION/REPAIR N/A 04/16/2019   Procedure: LEAD REVISION/REPAIR;  Surgeon: Thompson Grayer, MD;  Location: [facility];  Service: Cardiovascular;  Laterality: N/A;   RIGHT/LEFT HEART CATH AND CORONARY ANGIOGRAPHY N/A 01/22/2019   Procedure: RIGHT/LEFT HEART CATH AND CORONARY ANGIOGRAPHY;  Surgeon: Jolaine Artist, MD;  Location: [facility];  Service: Cardiovascular;  Laterality: N/A;   WISDOM TOOTH EXTRACTION     NO ANESTHESIA    OB History     Gravida  3   Para  2   Term  2   Preterm      AB  1   Living  2      SAB  1   IAB      Ectopic      Multiple  0   Live Births  2        Obstetric Comments  G2- D&C for missed AB          Home Medications    Prior to Admission medications   Medication Sig Start Date End Date Taking? Authorizing Provider  acetaminophen (TYLENOL) 325 MG tablet Take 650 mg by mouth every 6 (six) hours as needed for mild pain or headache.   Yes [provider]  amLODipine (NORVASC) 10 MG tablet Take 1 tablet (10  mg total) by mouth daily. 10/23/21  Yes Bensimhon, Shaune Pascal, MD  cyclobenzaprine (FLEXERIL) 10 MG tablet Take 1 tablet (10 mg total) by mouth 3 (three) times daily as needed for muscle spasms. 07/18/20  Yes Montine Circle, PA-C  dapagliflozin propanediol (FARXIGA) 10 MG TABS tablet Take 1 tablet (10 mg total) by mouth daily before breakfast. 10/23/21  Yes Bensimhon, Shaune Pascal, MD  furosemide (LASIX) 40 MG tablet Take 1 tablet (40 mg total) by mouth 2 (two) times daily. 10/23/21  Yes Bensimhon, Shaune Pascal, MD  gabapentin (NEURONTIN) 300 MG capsule Take 1 capsule (300 mg total) by mouth daily. 03/12/22  Yes Serafina Mitchell, MD  HYDROcodone-acetaminophen (NORCO) 5-325 MG tablet Take 1 tablet by mouth every 6 (six) hours as needed for severe pain. 03/08/22   Yes Baglia, Corrina, PA-C  levonorgestrel (MIRENA) 20 MCG/24HR IUD 1 each by Intrauterine route once.   Yes [provider]  meclizine (ANTIVERT) 25 MG tablet Take 1 tablet (25 mg total) by mouth 3 (three) times daily as needed for dizziness. 03/16/22  Yes Bensimhon, Shaune Pascal, MD  metoprolol succinate (TOPROL-XL) 25 MG 24 hr tablet Take 1 tablet (25 mg total) by mouth at bedtime. 10/23/21  Yes Bensimhon, Shaune Pascal, MD  NON FORMULARY Pt uses a cpap nightly   Yes [provider]  potassium chloride SA (KLOR-CON M) 20 MEQ tablet Take 1 tablet (20 mEq total) by mouth daily. 10/23/21  Yes Bensimhon, Shaune Pascal, MD  sacubitril-valsartan (ENTRESTO) 97-103 MG Take 1 tablet by mouth 2 (two) times daily. 10/23/21  Yes Bensimhon, Shaune Pascal, MD  Semaglutide, 1 MG/DOSE, (OZEMPIC, 1 MG/DOSE,) 4 MG/3ML SOPN Inject 1 mg into the skin once a week. 10/30/21  Yes Bensimhon, Shaune Pascal, MD  spironolactone (ALDACTONE) 25 MG tablet Take 1 tablet (25 mg total) by mouth daily. 10/23/21  Yes Bensimhon, Shaune Pascal, MD    Family History Family History  Problem Relation Age of Onset   Epilepsy Mother    Asthma Mother    Cancer Paternal Grandmother        BREAST   Cancer Maternal Aunt 76       BREAST   Hypertension Maternal Grandmother     Social History Social History   Tobacco Use   Smoking status: Never    Passive exposure: Never   Smokeless tobacco: Never  Vaping Use   Vaping Use: Never used  Substance Use Topics   Alcohol use: No   Drug use: No     Allergies   Patient has no known allergies.   Review of Systems Review of Systems  Respiratory:  Positive for cough and shortness of breath.      Physical Exam Triage Vital Signs ED Triage Vitals  Enc Vitals Group     BP 04/11/22 1509 103/73     Pulse Rate 04/11/22 1509 83     Resp 04/11/22 1509 20     Temp 04/11/22 1509 98.4 F (36.9 C)     Temp Source 04/11/22 1509 Oral     SpO2 04/11/22 1509 94 %     Weight --      Height --       Head Circumference --      Peak Flow --      Pain Score 04/11/22 1507 6     Pain Loc --      Pain Edu? --      Excl. in [place]? --    No data found.  Updated  Vital Signs BP 103/73 (BP Location: Right Arm)   Pulse 83   Temp 98.4 F (36.9 C) (Oral)   Resp 20   LMP  (LMP Unknown)   SpO2 94%   Breastfeeding No   Visual Acuity Right Eye Distance:   Left Eye Distance:   Bilateral Distance:    Right Eye Near:   Left Eye Near:    Bilateral Near:     Physical Exam Vitals reviewed.  Constitutional:      Comments: Respiratory rate is 26 here  HENT:     Nose: Nose normal.     Mouth/Throat:     Mouth: Mucous membranes are moist.  Eyes:     Extraocular Movements: Extraocular movements intact.     Pupils: Pupils are equal, round, and reactive to light.  Cardiovascular:     Rate and Rhythm: Normal rate and regular rhythm.     Heart sounds: No murmur heard. Pulmonary:     Breath sounds: No stridor. No wheezing or rhonchi.     Comments: I am unable to appreciate rales Musculoskeletal:        General: No swelling or tenderness.     Cervical back: No tenderness.  Lymphadenopathy:     Cervical: No cervical adenopathy.  Skin:    Coloration: Skin is not jaundiced or pale.  Neurological:     General: No focal deficit present.     Mental Status: She is oriented to person, place, and time.      UC Treatments / Results  Labs (all labs ordered are listed, but only abnormal results are displayed) Labs Reviewed - No data to display  EKG   Radiology DG Chest 2 View  Result Date: 04/11/2022 CLINICAL DATA:  Shortness of breath. EXAM: CHEST - 2 VIEW COMPARISON:  Chest two views 04/16/2019 and 04/08/2019; CT chest 07/18/2020 FINDINGS: Left chest wall AICD with single lead overlying the right ventricle, unchanged. However, the generator pack overlying the left upper chest appears to be different from prior. Additional new generator pack overlying the right chest wall with lead  extending superiorly up the right neck off of the superior plane of view. Cardiac silhouette is again moderately to markedly enlarged. Mediastinal contours are within normal limits. Mildly decreased lung volumes. Mild-to-moderate bilateral interstitial thickening, greatest within the lung bases. No pleural effusion or pneumothorax. Mild multilevel degenerative disc changes of the thoracic spine. IMPRESSION: 1. Mild-to-moderate bilateral interstitial thickening, greatest within the lung bases. This may represent mild interstitial pulmonary edema. 2. Unchanged cardiomegaly. 3. Compared to 04/16/2019, interval replacement of left chest wall AICD. Electronically Signed   By: Yvonne Kendall M.D.   On: 04/11/2022 11:54    Procedures Procedures (including critical care time)  Medications Ordered in UC Medications - No data to display  Initial Impression / Assessment and Plan / UC Course  I have reviewed the triage vital signs and the nursing notes.  Pertinent labs & imaging results that were available during my care of the patient were reviewed by me and considered in my medical decision making (see chart for details).        Patient had gone to the emergency room and due to the 18-hour wait, she then presented here.  Was done in the emergency room are reviewed today: Her chest x-ray done in the emergency room shows some interstitial fluid and stable cardiomegaly.  The potassium was 3.3 and other electrolytes were normal.  Her CO2 was low at 21, probably due to her tachypnea.  CBC fairly normal and viral swabs were all negative.  BNP was 955  Her vital signs are fairly reassuring.  She is not tachycardic.  I am going to give her instructions to increase her Lasix dosing for 3 days and to increase her potassium also.  I have asked her to please call her cardiologist  And if she is not improving or if she is worsening why she needs to present again to the emergency room. Final Clinical Impressions(s) /  UC Diagnoses   Final diagnoses:  Acute on chronic congestive heart failure, unspecified heart failure type [facility])     Discharge Instructions      Your chest x-ray done at the emergency room showed fluid in your lungs. Your potassium was low at 3.3 Your swabs for COVID and flu and RSV were negative  Increase your furosemide 40 mg--for 3 days take 2 tablets in the morning and 1 tablet in the afternoon.  Then go back to 1 tablet 2 times a day  For 3 days increase your potassium to 3 daily, and then go back to taking 2 a day your potassium was low when they did the blood work.  If you are getting any worse or not improving please go back to the emergency room.  When you get home today, please call your cardiologist     ED Prescriptions   None    PDMP not reviewed this encounter.   Todorov Henle, MD 04/11/22 [phone]

## 2022-04-11 NOTE — Discharge Instructions (Addendum)
Your chest x-ray done at the emergency room showed fluid in your lungs. Your potassium was low at 3.3 Your swabs for COVID and flu and RSV were negative  Increase your furosemide 40 mg--for 3 days take 2 tablets in the morning and 1 tablet in the afternoon.  Then go back to 1 tablet 2 times a day  For 3 days increase your potassium to 3 daily, and then go back to taking 2 a day your potassium was low when they did the blood work.  If you are getting any worse or not improving please go back to the emergency room.  When you get home today, please call your cardiologist

## 2022-04-11 NOTE — ED Provider Triage Note (Signed)
Emergency Medicine Provider Triage Evaluation Note  Tara Bradshaw , a 37 y.o. female  was evaluated in triage.  Pt complains of shortness of breath.  Has been present for the past 8 to 10 days.  Her son was sick and she believes she caught something from him.  Has a history of CHF.  States his symptoms feel similar to when she was first diagnosed with CHF 3 years ago.  Takes Entresto and Lasix 40 mg daily.  Has not missed any doses.  Has not used her CPAP for the past month.  Denies chest pain.  Cough is nonproductive.  Review of Systems  Positive: As above Negative: As above  Physical Exam  BP 128/78   Pulse 81   Temp 98.8 F (37.1 C) (Oral)   Resp (!) 39   SpO2 100%  Gen:   Awake, no distress   Resp:  Normal effort, tachypneic to 30 respirations per minute MSK:   Moves extremities without difficulty  Other:    Medical Decision Making  Medically screening exam initiated at 11:18 AM.  Appropriate orders placed.  Tara Bradshaw was informed that the remainder of the evaluation will be completed by another provider, this initial triage assessment does not replace that evaluation, and the importance of remaining in the ED until their evaluation is complete.  CHF workup   Roylene Reason, Hershal Coria 04/11/22 1120

## 2022-04-11 NOTE — ED Triage Notes (Signed)
BIB [facility] with reports of cough, congestion, and worsening shortness of breath. Pt reports she has not been using her CPAP for 1 month. Hx of CHF.

## 2022-04-11 NOTE — Telephone Encounter (Signed)
Patient went to the urgent care and was told that their was fluid around her lungs. Patient was calling to get appt ASAP but first available not till 1/24. Patient calling to see what can be done. Please

## 2022-04-11 NOTE — ED Triage Notes (Signed)
Chief Complaint: SOB and unable to sleep. Unable to lay flat. Coughing spells that cause SOB. Patient has a pacemaker defibrillator, states felt this way when the pace maker was placed. Patient states having chest tightness and pain in the ribcage with coughing. Patient does have history of bronchitis   Onset: 1.5 weeks.   Prescriptions or OTC medications tried: Yes- Mucinex, sudafed, lemon hot tea, cough drops     with no relief  Sick exposure: kids had a stomach bug   New foods, medications, or products: No  Recent Travel: No

## 2022-04-12 NOTE — Progress Notes (Signed)
Cardiology Office Note:    Date:  04/13/2022   ID:  Tara Bradshaw, DOB 01/26/1986, MRN [id number]  PCP:  Patient, No Pcp Per  Cardiologist:  None   Referring MD: No ref. provider found   Chief Complaint  Patient presents with   Congestive Heart Failure    History of Present Illness:    Tara Bradshaw is a 37 y.o. female with a hx of  nonischemic CM, single-chamber ICD, Verastem implantation, chronic amiodarone therapy, and recent URI set up to see DOD because of inability to be seen by A[facility] Clinic and long wait in ER.  OV arranged because her HF team is on vacation: " Spoke with the patient who states that she went to the ER this morning due to worsening shortness of breath. She had labs and xray done and was told it was going to be a 15 hour wait. She went over to the urgent care and they reviewed ER workup. They increased her Lasix and potassium for three days. She was told to follow up with cardiology ASAP. She tried to get in at CHF clinic but was unable to get an appointment. I scheduled her to see DOD on 1/5, she is aware to return to ER for any worsening symptoms. Will also route to device clinic for interrogation.    '  Pleasant female with nonischemic cardiomyopathy who has who developed a upper respiratory infection the Friday before Christmas and since that time has had chronic coughing and over the past 5 days has noted dyspnea on exertion and orthopnea associated with cough.  She tried to be seen in the emergency room but felt that the wait was too long.  She was eventually seen at urgent care where Lasix dose was increased by 50%.  This is brought about some improvement with her taking 80 mg of furosemide in a.m. and 40 in the p.m.  Her caterer dose was increased to 20 mEq twice daily.  Feels some better now than she did on January 3 when creatinine was 0.62, potassium 3.3, was told to increase the furosemide to 80 mg a.m. and 40 mg p.m.  Past Medical History:  Diagnosis Date    AICD (automatic cardioverter/defibrillator) present    Anemia    CHRONIC   CHF (congestive heart failure) (HCC)    Chronic systolic dysfunction of left ventricle    GERD (gastroesophageal reflux disease)    WITH PREGNANCY   Nonischemic cardiomyopathy (HCC)    Pneumonia    x 1   PVC's (premature ventricular contractions)    Sleep apnea     Past Surgical History:  Procedure Laterality Date   CARDIAC CATHETERIZATION     DILATION AND EVACUATION N/A 11/30/2016   Procedure: DILATATION AND EVACUATION;  Surgeon: Donnamae Jude, MD;  Location: [facility];  Service: Gynecology;  Laterality: N/A;   ICD IMPLANT N/A 02/19/2019   Procedure: ICD IMPLANT;  Surgeon: Thompson Grayer, MD;  Location: [facility];  Service: Cardiovascular;  Laterality: N/A;   LEAD REVISION/REPAIR N/A 04/16/2019   Procedure: LEAD REVISION/REPAIR;  Surgeon: Thompson Grayer, MD;  Location: [facility];  Service: Cardiovascular;  Laterality: N/A;   RIGHT/LEFT HEART CATH AND CORONARY ANGIOGRAPHY N/A 01/22/2019   Procedure: RIGHT/LEFT HEART CATH AND CORONARY ANGIOGRAPHY;  Surgeon: Jolaine Artist, MD;  Location: [facility];  Service: Cardiovascular;  Laterality: N/A;   WISDOM TOOTH EXTRACTION     NO ANESTHESIA    Current Medications: Current Meds  Medication Sig   acetaminophen (TYLENOL) 325 MG tablet Take 650 mg by mouth every 6 (six) hours as needed for mild pain or headache.   cyclobenzaprine (FLEXERIL) 10 MG tablet Take 1 tablet (10 mg total) by mouth 3 (three) times daily as needed for muscle spasms.   furosemide (LASIX) 40 MG tablet Take 2 tablets (80 mg total) by mouth 2 (two) times daily.   gabapentin (NEURONTIN) 300 MG capsule Take 1 capsule (300 mg total) by mouth daily.   levonorgestrel (MIRENA) 20 MCG/24HR IUD 1 each by Intrauterine route once.   meclizine (ANTIVERT) 25 MG tablet Take 1 tablet (25 mg total) by mouth 3 (three) times daily as needed for dizziness.   metolazone (ZAROXOLYN)  2.5 MG tablet Take 1 tablet (2.'5mg'$ ) by mouth 30 minutes before your morning dose of Lasix '80mg'$  on Saturday 04/14/22. Do not take more until instructed to do so by cardiologist office.   NON FORMULARY Pt uses a cpap nightly   Semaglutide, 1 MG/DOSE, (OZEMPIC, 1 MG/DOSE,) 4 MG/3ML SOPN Inject 1 mg into the skin once a week.   [DISCONTINUED] amLODipine (NORVASC) 10 MG tablet Take 1 tablet (10 mg total) by mouth daily.   [DISCONTINUED] dapagliflozin propanediol (FARXIGA) 10 MG TABS tablet Take 1 tablet (10 mg total) by mouth daily before breakfast.   [DISCONTINUED] furosemide (LASIX) 40 MG tablet Take 1 tablet (40 mg total) by mouth 2 (two) times daily.   [DISCONTINUED] metoprolol succinate (TOPROL-XL) 25 MG 24 hr tablet Take 1 tablet (25 mg total) by mouth at bedtime.   [DISCONTINUED] potassium chloride SA (KLOR-CON M) 20 MEQ tablet Take 20 mEq by mouth 2 (two) times daily.   [DISCONTINUED] sacubitril-valsartan (ENTRESTO) 97-103 MG Take 1 tablet by mouth 2 (two) times daily.   [DISCONTINUED] spironolactone (ALDACTONE) 25 MG tablet Take 1 tablet (25 mg total) by mouth daily.     Allergies:   Patient has no known allergies.   Social History   Socioeconomic History   Marital status: Widowed    Spouse name: Not on file   Number of children: Not on file   Years of education:  14   Highest education level: Not on file  Occupational History   Occupation: SECURITY OFFICER    Employer: [employer]  Tobacco Use   Smoking status: Never    Passive exposure: Never   Smokeless tobacco: Never  Vaping Use   Vaping Use: Never used  Substance and Sexual Activity   Alcohol use: No   Drug use: No   Sexual activity: Yes    Partners: Male    Birth control/protection: None  Other Topics Concern   Not on file  Social History Narrative   Lives in [place] with husband and children (6,1)   Unemployed    Previously worked in [employer].   Social Determinants of Health   Financial  Resource Strain: Low Risk  (12/05/2017)   Overall Financial Resource Strain (CARDIA)    Difficulty of Paying Living Expenses: Not hard at all  Food Insecurity: No Food Insecurity (12/05/2017)   Hunger Vital Sign    Worried About Running Out of Food in the Last Year: Never true    Ran Out of Food in the Last Year: Never true  Transportation Needs: No Transportation Needs (12/05/2017)   PRAPARE - Hydrologist (Medical): No    Lack of Transportation (Non-Medical): No  Physical Activity: Inactive (12/05/2017)   Exercise Vital Sign    Days of  Exercise per Week: 0 days    Minutes of Exercise per Session: 0 min  Stress: Not on file  Social Connections: Not on file     Family History: The patient's family history includes Asthma in her mother; Cancer in her paternal grandmother; Cancer (age of onset: 72) in her maternal aunt; Epilepsy in her mother; Hypertension in her maternal grandmother.  ROS:   Please see the history of present illness.    Mild orthopnea.  No history of AICD discharge.  All other systems reviewed and are negative.  EKGs/Labs/Other Studies Reviewed:    The following studies were reviewed today:  Echocardiogarm 10/2021: IMPRESSIONS   1. Left ventricular ejection fraction, by estimation, is 25 to 30%. The  left ventricle has severely decreased function. The left ventricle  demonstrates global hypokinesis. The left ventricular internal cavity size  was severely dilated. Left ventricular  diastolic parameters are indeterminate.   2. Right ventricular systolic function is normal. The right ventricular  size is mildly enlarged.   3. Left atrial size was moderately dilated.   4. The mitral valve is normal in structure. Mild mitral valve  regurgitation. No evidence of mitral stenosis.   5. The aortic valve was not well visualized. Aortic valve regurgitation  is not visualized. No aortic stenosis is present.   6. The inferior vena cava is normal  in size with greater than 50%  respiratory variability, suggesting right atrial pressure of 3 mmHg.   EKG:  EKG demonstrates sinus rhythm, poor R wave progression V1 through V4, PVCs, and mild left axis deviation.  There is interventricular conduction delay at 110 ms.  Recent Labs: 05/05/2021: TSH 0.492 01/01/2022: ALT 18 04/11/2022: B Natriuretic Peptide 955.1; BUN 6; Creatinine, Ser 0.62; Hemoglobin 11.4; Platelets 217; Potassium 3.3; Sodium 137  Recent Lipid Panel    Component Value Date/Time   CHOL 212 (H) 06/04/2007 2012   TRIG 88 06/04/2007 2012   HDL 46 06/04/2007 2012   CHOLHDL 4.6 Ratio 06/04/2007 2012   VLDL 18 06/04/2007 2012   LDLCALC 148 (H) 06/04/2007 2012    Physical Exam:    VS:  BP 102/68   Pulse 68   Ht '5\' 2"'$  (1.575 m)   Wt 253 lb 9.6 oz (115 kg)   LMP  (LMP Unknown)   BMI 46.38 kg/m     Wt Readings from Last 3 Encounters:  04/13/22 253 lb 9.6 oz (115 kg)  03/12/22 257 lb (116.6 kg)  02/12/22 257 lb (116.6 kg)     GEN: While sitting, there is no significant JVD. No acute distress HEENT: Normal NECK: Lying at 40 degrees, JVD noted at mid neck.  JVD. LYMPHATICS: No lymphadenopathy CARDIAC: No murmur. RRR S3 gallop, or edema. VASCULAR:  Normal Pulses. No bruits. RESPIRATORY:  Clear to auscultation without rales, wheezing or rhonchi  ABDOMEN: Soft, non-tender, non-distended, No pulsatile mass, MUSCULOSKELETAL: No deformity  SKIN: Warm and dry NEUROLOGIC:  Alert and oriented x 3 PSYCHIATRIC:  Normal affect   ASSESSMENT:    1. Chronic systolic heart failure ([place])   2. ICD (implantable cardioverter-defibrillator) in place   3. Obesity, Class III, BMI 40-49.9 (morbid obesity) ([place])   4. OSA (obstructive sleep apnea)   5. Medication management    PLAN:    In order of problems listed above:  Increase furosemide to 80 mg twice daily.  Basic metabolic panel on Monday.  Needs to be seen in advanced heart failure clinic on Monday if at all possible.  If  dyspnea does not improve with this doubling of her diuretic regimen, or if she worsens she will need to be admitted to the hospital via the emergency room.  Could be that IV Lasix is going to be needed to get her volume status back to baseline.  Currently she estimates that her usual dry weight is 240 pounds and she is up to 253 pounds today.  Prior to the slight increase in the Lasix dose 2 days ago her weight was 257 pounds. Followed in device clinic Noted Use CPAP as recommended  Addendum: I spoke with Dr. Aundra Dubin to see if we could ensure patient is seen in the advanced heart failure clinic on Monday.  After discussing her case he recommended that in addition to increasing furosemide to 80 mg twice daily, she should take 2.5 mg of metolazone 30 minutes before the a.m. dose of furosemide on 04/14/2022.   Medication Adjustments/Labs and Tests Ordered: Current medicines are reviewed at length with the patient today.  Concerns regarding medicines are outlined above.  Orders Placed This Encounter  Procedures   Basic metabolic panel   EKG 62-IRSW   Meds ordered this encounter  Medications   amLODipine (NORVASC) 10 MG tablet    Sig: Take 1 tablet (10 mg total) by mouth daily.    Dispense:  90 tablet    Refill:  3   dapagliflozin propanediol (FARXIGA) 10 MG TABS tablet    Sig: Take 1 tablet (10 mg total) by mouth daily before breakfast.    Dispense:  90 tablet    Refill:  3   metoprolol succinate (TOPROL-XL) 25 MG 24 hr tablet    Sig: Take 1 tablet (25 mg total) by mouth at bedtime.    Dispense:  90 tablet    Refill:  3   potassium chloride SA (KLOR-CON M) 20 MEQ tablet    Sig: Take 1 tablet (20 mEq total) by mouth 2 (two) times daily.    Dispense:  180 tablet    Refill:  3   sacubitril-valsartan (ENTRESTO) 97-103 MG    Sig: Take 1 tablet by mouth 2 (two) times daily.    Dispense:  180 tablet    Refill:  3   spironolactone (ALDACTONE) 25 MG tablet    Sig: Take 1 tablet (25 mg total)  by mouth daily.    Dispense:  90 tablet    Refill:  3   furosemide (LASIX) 40 MG tablet    Sig: Take 2 tablets (80 mg total) by mouth 2 (two) times daily.   metolazone (ZAROXOLYN) 2.5 MG tablet    Sig: Take 1 tablet (2.'5mg'$ ) by mouth 30 minutes before your morning dose of Lasix '80mg'$  on Saturday 08/10/60. Do not take more until instructed to do so by cardiologist office.    Dispense:  5 tablet    Refill:  0    Patient Instructions  Medication Instructions:  Your physician has recommended you make the following change in your medication:   1) INCREASE furosemide (Lasix) to 80 mg twice daily (take 2 of your '40mg'$  tablets twice daily, may take 2 tablets this afternoon) 2) START metolazone 2.'5mg'$  once on Saturday Morning 30 minutes prior to morning dose of Lasix '80mg'$ . (Take 1 tablet (2.'5mg'$ ) by mouth 30 minutes before your morning dose of Lasix '80mg'$  on Saturday 04/14/22. Do not take more until instructed to do so by cardiologist office.)  **Weigh yourself every morning (after using the bathroom and before eating/drinking anything), keep a log of  your weights. If your weight decreases below 240 lbs then decreased Lasix to '40mg'$  twice daily.  *If you need a refill on your cardiac medications before your next appointment, please call your pharmacy*  Lab Work: Monday 04/16/2022: BMET (you may come to the lab any time between 7:30AM and 4:30PM)  If you have labs (blood work) drawn today and your tests are completely normal, you will receive your results only by: [place] (if you have MyChart) OR A paper copy in the mail If you have any lab test that is abnormal or we need to change your treatment, we will call you to review the results.  Testing/Procedures: NONE  Follow-Up: At [facility], you and your health needs are our priority.  As part of our continuing mission to provide you with exceptional heart care, we have created designated Provider Care Teams.  These Care Teams include  your primary Cardiologist (physician) and Advanced Practice Providers (APPs -  Physician Assistants and Nurse Practitioners) who all work together to provide you with the care you need, when you need it.  Your next appointment:   04/18/2022 at 2:45PM  The format for your next appointment:   In Person  Provider:   Ambrose Pancoast, NP  Important Information About Sugar         Signed, Sinclair Grooms, MD  04/13/2022 3:30 PM    [place]

## 2022-04-12 NOTE — Telephone Encounter (Signed)
Transmission received.    Last NSVT 03/27/22 lasting 6 seconds.  See optivol

## 2022-04-13 ENCOUNTER — Ambulatory Visit: Payer: Medicare Other | Attending: Interventional Cardiology | Admitting: Interventional Cardiology

## 2022-04-13 ENCOUNTER — Telehealth: Payer: Self-pay

## 2022-04-13 ENCOUNTER — Encounter: Payer: Self-pay | Admitting: Interventional Cardiology

## 2022-04-13 VITALS — BP 102/68 | HR 68 | Ht 62.0 in | Wt 253.6 lb

## 2022-04-13 DIAGNOSIS — I5022 Chronic systolic (congestive) heart failure: Secondary | ICD-10-CM | POA: Diagnosis not present

## 2022-04-13 DIAGNOSIS — G4733 Obstructive sleep apnea (adult) (pediatric): Secondary | ICD-10-CM

## 2022-04-13 DIAGNOSIS — Z95810 Presence of automatic (implantable) cardiac defibrillator: Secondary | ICD-10-CM

## 2022-04-13 DIAGNOSIS — Z79899 Other long term (current) drug therapy: Secondary | ICD-10-CM

## 2022-04-13 MED ORDER — AMLODIPINE BESYLATE 10 MG PO TABS: 10.0000 mg | ORAL_TABLET | Freq: Every day | ORAL | 3 refills | Status: DC

## 2022-04-13 MED ORDER — DAPAGLIFLOZIN PROPANEDIOL 10 MG PO TABS: 10.0000 mg | ORAL_TABLET | Freq: Every day | ORAL | 3 refills | Status: DC

## 2022-04-13 MED ORDER — SPIRONOLACTONE 25 MG PO TABS: 25.0000 mg | ORAL_TABLET | Freq: Every day | ORAL | 3 refills | Status: DC

## 2022-04-13 MED ORDER — METOLAZONE 2.5 MG PO TABS: ORAL_TABLET | ORAL | 0 refills | Status: DC

## 2022-04-13 MED ORDER — ENTRESTO 97-103 MG PO TABS: 1.0000 | ORAL_TABLET | Freq: Two times a day (BID) | ORAL | 3 refills | Status: DC

## 2022-04-13 MED ORDER — FUROSEMIDE 40 MG PO TABS: 80.0000 mg | ORAL_TABLET | Freq: Two times a day (BID) | ORAL | Status: DC

## 2022-04-13 MED ORDER — POTASSIUM CHLORIDE CRYS ER 20 MEQ PO TBCR: 20.0000 meq | EXTENDED_RELEASE_TABLET | Freq: Two times a day (BID) | ORAL | 3 refills | Status: DC

## 2022-04-13 MED ORDER — FUROSEMIDE 40 MG PO TABS: 80.0000 mg | ORAL_TABLET | Freq: Two times a day (BID) | ORAL | 0 refills | Status: DC

## 2022-04-13 MED ORDER — METOPROLOL SUCCINATE ER 25 MG PO TB24: 25.0000 mg | ORAL_TABLET | Freq: Every day | ORAL | 3 refills | Status: DC

## 2022-04-13 NOTE — Telephone Encounter (Signed)
        Patient  visited [facility] on 04/11/2022  for shortness of breath.   Telephone encounter attempt :  1st  Unable to leave message voicemail is full.   [facility] Care Guide   [email or web address]  ?? [phone]   Website: [email or web address]

## 2022-04-13 NOTE — Progress Notes (Signed)
Remote ICD transmission.   

## 2022-04-13 NOTE — Patient Instructions (Addendum)
Medication Instructions:  Your physician has recommended you make the following change in your medication:   1) INCREASE furosemide (Lasix) to 80 mg twice daily (take 2 of your '40mg'$  tablets twice daily, may take 2 tablets this afternoon) 2) START metolazone 2.'5mg'$  once on Saturday Morning 30 minutes prior to morning dose of Lasix '80mg'$ . (Take 1 tablet (2.'5mg'$ ) by mouth 30 minutes before your morning dose of Lasix '80mg'$  on Saturday 04/14/22. Do not take more until instructed to do so by cardiologist office.)  **Weigh yourself every morning (after using the bathroom and before eating/drinking anything), keep a log of your weights. If your weight decreases below 240 lbs then decreased Lasix to '40mg'$  twice daily.  *If you need a refill on your cardiac medications before your next appointment, please call your pharmacy*  Lab Work: Monday 04/16/2022: BMET (you may come to the lab any time between 7:30AM and 4:30PM)  If you have labs (blood work) drawn today and your tests are completely normal, you will receive your results only by: [place] (if you have MyChart) OR A paper copy in the mail If you have any lab test that is abnormal or we need to change your treatment, we will call you to review the results.  Testing/Procedures: NONE  Follow-Up: At [facility], you and your health needs are our priority.  As part of our continuing mission to provide you with exceptional heart care, we have created designated Provider Care Teams.  These Care Teams include your primary Cardiologist (physician) and Advanced Practice Providers (APPs -  Physician Assistants and Nurse Practitioners) who all work together to provide you with the care you need, when you need it.  Your next appointment:   04/18/2022 at 2:45PM  The format for your next appointment:   In Person  Provider:   Ambrose Pancoast, NP  Important Information About Sugar

## 2022-04-16 ENCOUNTER — Other Ambulatory Visit: Payer: Medicare Other

## 2022-04-16 ENCOUNTER — Encounter: Payer: Medicare Other | Admitting: *Deleted

## 2022-04-16 ENCOUNTER — Telehealth (HOSPITAL_COMMUNITY): Payer: Self-pay

## 2022-04-16 ENCOUNTER — Encounter: Payer: Self-pay | Admitting: *Deleted

## 2022-04-16 ENCOUNTER — Encounter (HOSPITAL_COMMUNITY): Payer: Self-pay | Admitting: Cardiology

## 2022-04-16 ENCOUNTER — Ambulatory Visit (HOSPITAL_COMMUNITY)
Admission: RE | Admit: 2022-04-16 | Discharge: 2022-04-16 | Disposition: A | Discharge status: Home / Self Care | Payer: Medicare Other | Source: Ambulatory Visit | Attending: Cardiology | Admitting: Cardiology

## 2022-04-16 VITALS — BP 102/60 | HR 74 | Wt 254.4 lb

## 2022-04-16 DIAGNOSIS — Z79899 Other long term (current) drug therapy: Secondary | ICD-10-CM | POA: Insufficient documentation

## 2022-04-16 DIAGNOSIS — I428 Other cardiomyopathies: Secondary | ICD-10-CM | POA: Insufficient documentation

## 2022-04-16 DIAGNOSIS — I5023 Acute on chronic systolic (congestive) heart failure: Secondary | ICD-10-CM | POA: Diagnosis not present

## 2022-04-16 DIAGNOSIS — I110 Hypertensive heart disease with heart failure: Secondary | ICD-10-CM | POA: Insufficient documentation

## 2022-04-16 DIAGNOSIS — R6881 Early satiety: Secondary | ICD-10-CM | POA: Insufficient documentation

## 2022-04-16 DIAGNOSIS — I429 Cardiomyopathy, unspecified: Secondary | ICD-10-CM

## 2022-04-16 DIAGNOSIS — Z006 Encounter for examination for normal comparison and control in clinical research program: Secondary | ICD-10-CM

## 2022-04-16 LAB — COMPREHENSIVE METABOLIC PANEL
ALT: 22 U/L (ref 0–44)
AST: 23 U/L (ref 15–41)
Albumin: 3.8 g/dL (ref 3.5–5.0)
Alkaline Phosphatase: 47 U/L (ref 38–126)
Anion gap: 9 (ref 5–15)
BUN: 8 mg/dL (ref 6–20)
CO2: 26 mmol/L (ref 22–32)
Calcium: 9.4 mg/dL (ref 8.9–10.3)
Chloride: 101 mmol/L (ref 98–111)
Creatinine, Ser: 0.72 mg/dL (ref 0.44–1.00)
GFR, Estimated: >60 mL/min (ref >60)
Glucose, Bld: 99 mg/dL (ref 70–99)
Potassium: 3.4 mmol/L — ABNORMAL LOW (ref 3.5–5.1)
Sodium: 136 mmol/L (ref 135–145)
Total Bilirubin: 0.5 mg/dL (ref 0.3–1.2)
Total Protein: 6.9 g/dL (ref 6.5–8.1)

## 2022-04-16 LAB — CBC
HCT: 38.9 % (ref 36.0–46.0)
Hemoglobin: 12.4 g/dL (ref 12.0–15.0)
MCH: 26.7 pg (ref 26.0–34.0)
MCHC: 31.9 g/dL (ref 30.0–36.0)
MCV: 83.8 fL (ref 80.0–100.0)
Platelets: 255 10*3/uL (ref 150–400)
RBC: 4.64 MIL/uL (ref 3.87–5.11)
RDW: 14 % (ref 11.5–15.5)
WBC: 6.8 10*3/uL (ref 4.0–10.5)
nRBC: 0 % (ref 0.0–0.2)

## 2022-04-16 LAB — BRAIN NATRIURETIC PEPTIDE: B Natriuretic Peptide: 149.2 pg/mL — ABNORMAL HIGH (ref 0.0–100.0)

## 2022-04-16 NOTE — Telephone Encounter (Signed)
Patient aware and agreeable. 

## 2022-04-16 NOTE — Progress Notes (Signed)
ADVANCED HEART FAILURE CLINIC NOTE   Primary Cardiologist: Dr. Haroldine Laws   HPI: Tara Bradshaw is a 37 y.o. female w/ nonischemic cardiomyopathy s/p SICD, HTN and systolic heart failure diagnosed in 06/26/21 w/ LVEF 20-25% regularly followed by Dr. Haroldine Laws presenting today as an acute care visit secondary to persistent shortness of breath, decreased appetite and 10+ lb weight gain.    According to [name], since Christmas time she has had significant shortness of breath, decrease in exercise tolerance, worsening lower extremity edema and orthopnea. She noticed that she could no longer walk more than 76f without having to stop due to shortness of breath. In addition, sleeping on 4-5 pillows. During this time she has also noticed a 10-15lb weight gain. Patient was seen by cardiology last week where her lasix dose was increased to '80mg'$  with the addition of metolazone. She had minimal improvement with this. Today she reports continued shortness of breath and orthopnea. She can walk 15-237fbefore needing to stop due to significant dyspnea. In addition, her appetite has decreased due to early satiety.   Activity level/exercise tolerance:  Poor, NYHA III Orthopnea:  Sleeps on 4 pillows Paroxysmal noctural dyspnea:  Yes Chest pain/pressure:  No Orthostatic lightheadedness:  No Palpitations:  No Lower extremity edema:  Yes, 2+ Presyncope/syncope:  No Cough:  Yes  Past Medical History:  Diagnosis Date   AICD (automatic cardioverter/defibrillator) present    Anemia    CHRONIC   CHF (congestive heart failure) (HCC)    Chronic systolic dysfunction of left ventricle    GERD (gastroesophageal reflux disease)    WITH PREGNANCY   Nonischemic cardiomyopathy (HCC)    Pneumonia    x 1   PVC's (premature ventricular contractions)    Sleep apnea     Current Outpatient Medications  Medication Sig Dispense Refill   acetaminophen (TYLENOL) 325 MG tablet Take 650 mg by mouth every 6 (six)  hours as needed for mild pain or headache.     amLODipine (NORVASC) 10 MG tablet Take 1 tablet (10 mg total) by mouth daily. 90 tablet 3   cyclobenzaprine (FLEXERIL) 10 MG tablet Take 1 tablet (10 mg total) by mouth 3 (three) times daily as needed for muscle spasms. 10 tablet 0   dapagliflozin propanediol (FARXIGA) 10 MG TABS tablet Take 1 tablet (10 mg total) by mouth daily before breakfast. 90 tablet 3   furosemide (LASIX) 40 MG tablet Take 40 mg by mouth 2 (two) times daily.     gabapentin (NEURONTIN) 300 MG capsule Take 1 capsule (300 mg total) by mouth daily. 30 capsule 1   levonorgestrel (MIRENA) 20 MCG/24HR IUD 1 each by Intrauterine route once.     meclizine (ANTIVERT) 25 MG tablet Take 1 tablet (25 mg total) by mouth 3 (three) times daily as needed for dizziness. 30 tablet 0   metolazone (ZAROXOLYN) 2.5 MG tablet Take 1 tablet (2.'5mg'$ ) by mouth 30 minutes before your morning dose of Lasix '80mg'$  on Saturday 04/14/22. Do not take more until instructed to do so by cardiologist office. 5 tablet 0   metoprolol succinate (TOPROL-XL) 25 MG 24 hr tablet Take 1 tablet (25 mg total) by mouth at bedtime. 90 tablet 3   NON FORMULARY Pt uses a cpap nightly     potassium chloride SA (KLOR-CON M) 20 MEQ tablet Take 1 tablet (20 mEq total) by mouth 2 (two) times daily. 180 tablet 3   sacubitril-valsartan (ENTRESTO) 97-103 MG Take 1 tablet by mouth 2 (two) times daily.  180 tablet 3   Semaglutide, 1 MG/DOSE, (OZEMPIC, 1 MG/DOSE,) 4 MG/3ML SOPN Inject 1 mg into the skin once a week. 3 mL 11   spironolactone (ALDACTONE) 25 MG tablet Take 1 tablet (25 mg total) by mouth daily. 90 tablet 3   No current facility-administered medications for this encounter.    No Known Allergies    Social History   Socioeconomic History   Marital status: Widowed    Spouse name: Not on file   Number of children: Not on file   Years of education:  14   Highest education level: Not on file  Occupational History    Occupation: SECURITY OFFICER    Employer: [employer]  Tobacco Use   Smoking status: Never    Passive exposure: Never   Smokeless tobacco: Never  Vaping Use   Vaping Use: Never used  Substance and Sexual Activity   Alcohol use: No   Drug use: No   Sexual activity: Yes    Partners: Male    Birth control/protection: None  Other Topics Concern   Not on file  Social History Narrative   Lives in [place] with husband and children (6,1)   Unemployed    Previously worked in [employer].   Social Determinants of Health   Financial Resource Strain: Low Risk  (12/05/2017)   Overall Financial Resource Strain (CARDIA)    Difficulty of Paying Living Expenses: Not hard at all  Food Insecurity: No Food Insecurity (12/05/2017)   Hunger Vital Sign    Worried About Running Out of Food in the Last Year: Never true    Ran Out of Food in the Last Year: Never true  Transportation Needs: No Transportation Needs (12/05/2017)   PRAPARE - Hydrologist (Medical): No    Lack of Transportation (Non-Medical): No  Physical Activity: Inactive (12/05/2017)   Exercise Vital Sign    Days of Exercise per Week: 0 days    Minutes of Exercise per Session: 0 min  Stress: Not on file  Social Connections: Not on file  Intimate Partner Violence: Not At Risk (12/05/2017)   Humiliation, Afraid, Rape, and Kick questionnaire    Fear of Current or Ex-Partner: No    Emotionally Abused: No    Physically Abused: No    Sexually Abused: No      Family History  Problem Relation Age of Onset   Epilepsy Mother    Asthma Mother    Cancer Paternal Grandmother        BREAST   Cancer Maternal Aunt 9       BREAST   Hypertension Maternal Grandmother     PHYSICAL EXAM: Vitals:   04/16/22 1025  BP: 102/60  Pulse: 74  SpO2: 96%   GENERAL: Well nourished, well developed, and in no apparent distress at rest.  HEENT: Negative for arcus senilis or xanthelasma. There is no  scleral icterus.  The mucous membranes are pink and moist.   NECK: Supple, No masses. Normal carotid upstrokes without bruits. No masses or thyromegaly.    CHEST: There are no chest wall deformities. There is no chest wall tenderness. Respirations are unlabored.  Lungs- decreased lung sounds at the bases but no crackles;  CARDIAC:  XTG:GYIRSWNIO to assess due to thick neck; appears to be 10-12cm         Normal S1, S2  Normal rate with regular rhythm. No murmurs, rubs or gallops.  Pulses are 2+ and symmetrical in upper and lower  extremities. 2+ non pitting edema.  ABDOMEN: Soft, non-tender, non-distended. There are no masses or hepatomegaly. There are normal bowel sounds.  EXTREMITIES: Warm and well perfused with no cyanosis, clubbing.  LYMPHATIC: No axillary or supraclavicular lymphadenopathy.  NEUROLOGIC: Patient is oriented x3 with no focal or lateralizing neurologic deficits.  PSYCH: Patients affect is appropriate, there is no evidence of anxiety or depression.  SKIN: Warm and dry; no lesions or wounds.   DATA REVIEW  ECG:NSR w/ intermittent PVC  ECHO: 10/09/21: LVEF 25%-30%, severely dilated LV; RV mildly enlarged.  03/17/20: LVEF 25%-30%; normal RV function.   CPX 03/17/20:  Submax test. No evidence of significant cardiopulmonary limitation despite LV dysfunction. When corrected to ideal body weight, measured pVO2 is normal. Suspect majority of limitation related to obesity.   ASSESSMENT & PLAN:  Acute on chronic HFrEF exacerbation At baseline, Ms. Thumm is fairly active; over the past 2 weeks she has had a significant decline in functional status along with 4+ orthopnea, PND and 10-15lb weight gain. Last week she was seen by cardiology where BNP was elevated from her baseline; she was subsequently started lasix '80mg'$  PO qAM with 40 at night and metolazone 2.'5mg'$ . CXR at the time w/ pulmonary edema. Today she continues to feel short of breath with early satiety.  - JVP appears elevated  on exam; distal extremities are warm to touch with no overt signs of low output heart failure. Possible pulse alternans on exam, however, I believe alternating pulses are due to PVCs.  - BNP/BMP/CBC today.  - REDS 39% - Furoscix today with metolazone 2.'5mg'$   - Start torsemide '80mg'$  daily tomorrow +/- metolazone depending on urine output. - If symptoms do not resolve will plan for RHC later this week.   Hilda Rynders Advanced Heart Failure Mechanical Circulatory Support

## 2022-04-16 NOTE — Progress Notes (Signed)
ReDS Vest / Clip - 04/16/22 1100       ReDS Vest / Clip   Station Marker B    Ruler Value 33    ReDS Value Range Moderate volume overload    ReDS Actual Value 39

## 2022-04-16 NOTE — Progress Notes (Signed)
Medication Samples have been provided to the patient.  Drug name: Furoscix       Strength: '80mg'$         Qty: 1  LOT: [id number]  Exp.Date: 08/07/2023  Dosing instructions: Take as directed by clinic  The patient has been instructed regarding the correct time, dose, and frequency of taking this medication, including desired effects and most common side effects.   Tara Bradshaw 11:36 AM 04/16/2022

## 2022-04-16 NOTE — Research (Signed)
REBALANCE Informed Consent   Subject Name: Tara Bradshaw  Subject met inclusion and exclusion criteria.  The informed consent form, study requirements and expectations were reviewed with the subject and questions and concerns were addressed prior to the signing of the consent form.  The subject verbalized understanding of the trial requirements.  The subject agreed to participate in the REBALANCE trial and signed the informed consent on 04-16-2022.  The informed consent was obtained prior to performance of any protocol-specific procedures for the subject.  A copy of the signed informed consent was given to the subject and a copy was placed in the subject's medical record.   [place] Oris Staffieri, research coordinator 04/16/2022

## 2022-04-16 NOTE — Patient Instructions (Addendum)
STOP Lasix   START Furoscix as directed.  Your provider has order Furoscix for you. This is an on-body infuser that gives you a dose of Furosemide.   It will be shipped to your home   [employer] will call you to discuss before shipping so, PLEASE answer unknown calls  For questions regarding the device call [employer] at [phone]  Ensure you write down the time you start your infusion so that if there is a problem you will know how long the infusion lasted  Use Furoscix only AS DIRECTED by our office  Dosing Directions:   Day 1= Furoscix kit  with 2.5 mg Metolazone  START Torsemide 80 mg  daily on 04/16/22  Labs done today, your results will be available in MyChart, we will contact you for abnormal readings.  Your physician recommends that you schedule a follow-up appointment on Friday 04/20/22  If you have any questions or concerns before your next appointment please send [place] a message through [place] or call our office at [phone].    TO LEAVE A MESSAGE FOR THE NURSE SELECT OPTION 2, PLEASE LEAVE A MESSAGE INCLUDING: YOUR NAME DATE OF BIRTH CALL BACK NUMBER REASON FOR CALL**this is important as we prioritize the call backs  YOU WILL RECEIVE A CALL BACK THE SAME DAY AS LONG AS YOU CALL BEFORE 4:00 PM  At the [facility], you and your health needs are our priority. As part of our continuing mission to provide you with exceptional heart care, we have created designated Provider Care Teams. These Care Teams include your primary Cardiologist (physician) and Advanced Practice Providers (APPs- Physician Assistants and Nurse Practitioners) who all work together to provide you with the care you need, when you need it.   You may see any of the following providers on your designated Care Team at your next follow up: Dr Glori Bickers Dr Loralie Champagne Dr. Roxana Hires, NP Lyda Jester, [place] [facility] [place],  [place] Forestine Na, NP Audry Riles, PharmD   Please be sure to bring in all your medications bottles to every appointment.

## 2022-04-17 ENCOUNTER — Telehealth (HOSPITAL_COMMUNITY): Payer: Self-pay | Admitting: Pharmacist

## 2022-04-17 ENCOUNTER — Telehealth: Payer: Self-pay

## 2022-04-17 NOTE — Telephone Encounter (Signed)
     Patient  visit on 04/11/2022  at [facility] was for shortness of breath. Patient LWBS after triage. Patient was seen at the urgent care.   Have you been able to follow up with your primary care physician? Patient has appointment with her Cardiologist 04/18/2022.  The patient was or was not able to obtain any needed medicine or equipment.  Are there diet recommendations that you are having difficulty following?  Patient expresses understanding of discharge instructions and education provided has no other needs at this time.    [place] Resource Care Guide   [email or web address]  ?? [phone]   Website: [email or web address]  [email or web address]

## 2022-04-17 NOTE — Progress Notes (Unsigned)
Office Visit    Patient Name: Tara Bradshaw Date of Encounter: 04/17/2022  Primary Care Provider:  Patient, No Pcp Per Primary Cardiologist:  None Primary Electrophysiologist: Vickie Epley, MD  Chief Complaint    Tara Bradshaw is a 37 y.o. female with PMH of NICM, s/p ICD, Verastem implant, HTN, HFrEF who presents for follow-up of shortness of breath.  Past Medical History    Past Medical History:  Diagnosis Date   AICD (automatic cardioverter/defibrillator) present    Anemia    CHRONIC   CHF (congestive heart failure) (HCC)    Chronic systolic dysfunction of left ventricle    GERD (gastroesophageal reflux disease)    WITH PREGNANCY   Nonischemic cardiomyopathy (HCC)    Pneumonia    x 1   PVC's (premature ventricular contractions)    Sleep apnea    Past Surgical History:  Procedure Laterality Date   CARDIAC CATHETERIZATION     DILATION AND EVACUATION N/A 11/30/2016   Procedure: DILATATION AND EVACUATION;  Surgeon: Donnamae Jude, MD;  Location: [facility];  Service: Gynecology;  Laterality: N/A;   ICD IMPLANT N/A 02/19/2019   Procedure: ICD IMPLANT;  Surgeon: Thompson Grayer, MD;  Location: [facility];  Service: Cardiovascular;  Laterality: N/A;   LEAD REVISION/REPAIR N/A 04/16/2019   Procedure: LEAD REVISION/REPAIR;  Surgeon: Thompson Grayer, MD;  Location: [facility];  Service: Cardiovascular;  Laterality: N/A;   RIGHT/LEFT HEART CATH AND CORONARY ANGIOGRAPHY N/A 01/22/2019   Procedure: RIGHT/LEFT HEART CATH AND CORONARY ANGIOGRAPHY;  Surgeon: Jolaine Artist, MD;  Location: [facility];  Service: Cardiovascular;  Laterality: N/A;   WISDOM TOOTH EXTRACTION     NO ANESTHESIA    Allergies  No Known Allergies  History of Present Illness    Tara Bradshaw  is a 37 year old female with the above mention past medical history who presents today for shortness of breath.  She was seen in 06/2018 for new onset CHF.  She developed cough and lower  extremity edema.  CXR completed showing cardiomegaly with bilateral infiltrates.  CT of the chest revealed infiltrates and effusions.  BNP was 1021 and 2D echo completed showing reduced EF of 25% with dilated ventricles and moderate RV dysfunction and mild MR.  She was found to have NICM in the setting of viral illness 77-monthpostpartum.  She was diuresed with Lasix and GDMT was optimized.  Echo was repeated at that visit and EF was 25-30% (read as 30-35%). She wore ZIO monitor 8/20 that shows sinus rhythm with occasional PVCs and 9 beat run of NSVT.  He was placed on amiodarone due to possible PVC cardiomyopathy.  She underwent R/LHC on 01/22/2019 that showed normal coronaries with compensated hemodynamics and high cardiac output.  She is not a candidate for CV MRI due to status.  She received single-chamber ICD for primary prevention of sudden cardiac death on 1November 23, 2020  She underwent lead revision 04/16/2019 with Dr. ARayann Heman  She was referred to cardiac rehab on 05/19/2020.  She had Verastem implanted 05/14/2021 by Dr. BTrula Slade  She was last seen in EP for follow-up 03/07/2022 and both Verastem and ICD function was normal.  She was seen 04/13/2022 for complaint of worsening shortness of breath.  She went to the ED for workup but was told it would be 15-hour wait.  Her Lasix and potassium were increased for 3 days.  She developed upper respiratory infection prior to Christmas and has had complaint of  cough and orthopnea.  She was given Lasix and metolazone.  She was instructed to follow-up with heart failure clinic and was informed that if volume is not improved she may need admission for IV Lasix.  She was by Dr. Daniel Nones on 04/16/2022 and continued to have complaints of shortness of breath and orthopnea.  She had elevated JVP's and pulse are tenderness on exam.  She was started on Lasix 80 mg daily with metolazone as needed.  Plan was made to pursue right heart cath later this week if symptoms do not  resolve.      Since last being seen in the office patient reports***.  Patient denies chest pain, palpitations, dyspnea, PND, orthopnea, nausea, vomiting, dizziness, syncope, edema, weight gain, or early satiety.     ***Notes:  Home Medications    Current Outpatient Medications  Medication Sig Dispense Refill   acetaminophen (TYLENOL) 325 MG tablet Take 650 mg by mouth every 6 (six) hours as needed for mild pain or headache.     amLODipine (NORVASC) 10 MG tablet Take 1 tablet (10 mg total) by mouth daily. 90 tablet 3   cyclobenzaprine (FLEXERIL) 10 MG tablet Take 1 tablet (10 mg total) by mouth 3 (three) times daily as needed for muscle spasms. 10 tablet 0   dapagliflozin propanediol (FARXIGA) 10 MG TABS tablet Take 1 tablet (10 mg total) by mouth daily before breakfast. 90 tablet 3   furosemide (LASIX) 40 MG tablet Take 40 mg by mouth 2 (two) times daily.     gabapentin (NEURONTIN) 300 MG capsule Take 1 capsule (300 mg total) by mouth daily. 30 capsule 1   levonorgestrel (MIRENA) 20 MCG/24HR IUD 1 each by Intrauterine route once.     meclizine (ANTIVERT) 25 MG tablet Take 1 tablet (25 mg total) by mouth 3 (three) times daily as needed for dizziness. 30 tablet 0   metolazone (ZAROXOLYN) 2.5 MG tablet Take 1 tablet (2.'5mg'$ ) by mouth 30 minutes before your morning dose of Lasix '80mg'$  on Saturday 04/14/22. Do not take more until instructed to do so by cardiologist office. 5 tablet 0   metoprolol succinate (TOPROL-XL) 25 MG 24 hr tablet Take 1 tablet (25 mg total) by mouth at bedtime. 90 tablet 3   NON FORMULARY Pt uses a cpap nightly     potassium chloride SA (KLOR-CON M) 20 MEQ tablet Take 1 tablet (20 mEq total) by mouth 2 (two) times daily. 180 tablet 3   sacubitril-valsartan (ENTRESTO) 97-103 MG Take 1 tablet by mouth 2 (two) times daily. 180 tablet 3   Semaglutide, 1 MG/DOSE, (OZEMPIC, 1 MG/DOSE,) 4 MG/3ML SOPN Inject 1 mg into the skin once a week. 3 mL 11   spironolactone (ALDACTONE)  25 MG tablet Take 1 tablet (25 mg total) by mouth daily. 90 tablet 3   No current facility-administered medications for this visit.     Review of Systems  Please see the history of present illness.    (+)*** (+)***  All other systems reviewed and are otherwise negative except as noted above.  Physical Exam    Wt Readings from Last 3 Encounters:  04/16/22 254 lb 6.4 oz (115.4 kg)  04/13/22 253 lb 9.6 oz (115 kg)  03/12/22 257 lb (116.6 kg)   ZH:YQMVH were no vitals filed for this visit.,There is no height or weight on file to calculate BMI.  Constitutional:      Appearance: Healthy appearance. Not in distress.  Neck:     Vascular: JVD normal.  Pulmonary:     Effort: Pulmonary effort is normal.     Breath sounds: No wheezing. No rales. Diminished in the bases Cardiovascular:     Normal rate. Regular rhythm. Normal S1. Normal S2.      Murmurs: There is no murmur.  Edema:    Peripheral edema absent.  Abdominal:     Palpations: Abdomen is soft non tender. There is no hepatomegaly.  Skin:    General: Skin is warm and dry.  Neurological:     General: No focal deficit present.     Mental Status: Alert and oriented to person, place and time.     Cranial Nerves: Cranial nerves are intact.  EKG/LABS/Other Studies Reviewed    ECG personally reviewed by me today - ***  Risk Assessment/Calculations:   {Does this patient have ATRIAL FIBRILLATION?:[phone]}        Lab Results  Component Value Date   WBC 6.8 04/16/2022   HGB 12.4 04/16/2022   HCT 38.9 04/16/2022   MCV 83.8 04/16/2022   PLT 255 04/16/2022   Lab Results  Component Value Date   CREATININE 0.72 04/16/2022   BUN 8 04/16/2022   NA 136 04/16/2022   K 3.4 (L) 04/16/2022   CL 101 04/16/2022   CO2 26 04/16/2022   Lab Results  Component Value Date   ALT 22 04/16/2022   AST 23 04/16/2022   ALKPHOS 47 04/16/2022   BILITOT 0.5 04/16/2022   Lab Results  Component Value Date   CHOL 212 (H) 06/04/2007    HDL 46 06/04/2007   LDLCALC 148 (H) 06/04/2007   TRIG 88 06/04/2007   CHOLHDL 4.6 Ratio 06/04/2007    Lab Results  Component Value Date   HGBA1C 5.6 07/03/2018    Assessment & Plan    1.  Chronic systolic CHF: -Today patient reports***  2.  Nonischemic cardiomyopathy: -Most recent 2D echo shows EF of 25-30% with severely decreased function and global hypokinesis   3.  ICD implant for primary prevention: -s/p single-chamber ICD for primary prevention due decreased systolic function.  4.  Obesity: -Patient's BMI is***      Disposition: Follow-up with None or APP in *** months {Are you ordering a CV Procedure (e.g. stress test, cath, DCCV, TEE, etc)?   Press F2        :[id number]}   Medication Adjustments/Labs and Tests Ordered: Current medicines are reviewed at length with the patient today.  Concerns regarding medicines are outlined above.   Signed, Mable Fill, Marissa Nestle, NP 04/17/2022, 9:45 PM [facility]  Note:  This document was prepared using Dragon voice recognition software and may include unintentional dictation errors.

## 2022-04-17 NOTE — Telephone Encounter (Signed)
Received VM from patient stating she received a "device" in clinic today and was wondering if was normal to have a headache and dizziness with this device. Per clinic notes, assume device was Furoscix. Mild headache and dizziness are normal adverse effects with Furoscix. Attempted to call patient to discuss but she did not answer and VM was full. She has repeat visit at [facility] on 04/20/22.   Audry Riles, PharmD, BCPS, BCCP, CPP Heart Failure Clinic Pharmacist [phone]

## 2022-04-18 ENCOUNTER — Other Ambulatory Visit (HOSPITAL_COMMUNITY): Payer: Self-pay | Admitting: *Deleted

## 2022-04-18 ENCOUNTER — Ambulatory Visit: Payer: Medicare Other | Attending: Nurse Practitioner | Admitting: Nurse Practitioner

## 2022-04-18 ENCOUNTER — Encounter: Payer: Self-pay | Admitting: Nurse Practitioner

## 2022-04-18 VITALS — BP 110/64 | HR 77 | Ht 62.0 in | Wt 253.0 lb

## 2022-04-18 DIAGNOSIS — I428 Other cardiomyopathies: Secondary | ICD-10-CM | POA: Diagnosis not present

## 2022-04-18 DIAGNOSIS — Z95810 Presence of automatic (implantable) cardiac defibrillator: Secondary | ICD-10-CM | POA: Diagnosis not present

## 2022-04-18 DIAGNOSIS — I5022 Chronic systolic (congestive) heart failure: Secondary | ICD-10-CM | POA: Diagnosis not present

## 2022-04-18 MED ORDER — TORSEMIDE 20 MG PO TABS: 80.0000 mg | ORAL_TABLET | Freq: Every day | ORAL | 3 refills | Status: DC

## 2022-04-18 NOTE — Patient Instructions (Addendum)
Medication Instructions:  Your physician recommends that you continue on your current medications as directed. Please refer to the Current Medication list given to you today. *If you need a refill on your cardiac medications before your next appointment, please call your pharmacy*   Lab Work: None Ordered   Testing/Procedures: None Ordered   Follow-Up: At [facility], you and your health needs are our priority.  As part of our continuing mission to provide you with exceptional heart care, we have created designated Provider Care Teams.  These Care Teams include your primary Cardiologist (physician) and Advanced Practice Providers (APPs -  Physician Assistants and Nurse Practitioners) who all work together to provide you with the care you need, when you need it.  We recommend signing up for the patient portal called "MyChart".  Sign up information is provided on this After Visit Summary.  MyChart is used to connect with patients for Virtual Visits (Telemedicine).  Patients are able to view lab/test results, encounter notes, upcoming appointments, etc.  Non-urgent messages can be sent to your provider as well.   To learn more about what you can do with MyChart, go to [email or web address].    Your next appointment:   As Needed   The format for your next appointment:   In Person  Provider:   Ambrose Pancoast, NP  Other Instructions Amyloidosis and sarcoidosis   Important Information About Sugar

## 2022-04-20 ENCOUNTER — Other Ambulatory Visit (HOSPITAL_COMMUNITY): Payer: Self-pay

## 2022-04-20 ENCOUNTER — Encounter (HOSPITAL_COMMUNITY): Payer: Self-pay | Admitting: Cardiology

## 2022-04-20 ENCOUNTER — Ambulatory Visit (HOSPITAL_COMMUNITY)
Admission: RE | Admit: 2022-04-20 | Discharge: 2022-04-20 | Disposition: A | Discharge status: Home / Self Care | Payer: Medicare Other | Source: Ambulatory Visit | Attending: Cardiology | Admitting: Cardiology

## 2022-04-20 VITALS — BP 100/70 | HR 77 | Wt 254.0 lb

## 2022-04-20 DIAGNOSIS — I5023 Acute on chronic systolic (congestive) heart failure: Secondary | ICD-10-CM | POA: Diagnosis not present

## 2022-04-20 DIAGNOSIS — Z95810 Presence of automatic (implantable) cardiac defibrillator: Secondary | ICD-10-CM | POA: Insufficient documentation

## 2022-04-20 DIAGNOSIS — I428 Other cardiomyopathies: Secondary | ICD-10-CM | POA: Insufficient documentation

## 2022-04-20 DIAGNOSIS — Z79899 Other long term (current) drug therapy: Secondary | ICD-10-CM | POA: Insufficient documentation

## 2022-04-20 DIAGNOSIS — I110 Hypertensive heart disease with heart failure: Secondary | ICD-10-CM | POA: Diagnosis not present

## 2022-04-20 DIAGNOSIS — R0602 Shortness of breath: Secondary | ICD-10-CM | POA: Diagnosis not present

## 2022-04-20 DIAGNOSIS — R5383 Other fatigue: Secondary | ICD-10-CM | POA: Diagnosis not present

## 2022-04-20 DIAGNOSIS — I5022 Chronic systolic (congestive) heart failure: Secondary | ICD-10-CM

## 2022-04-20 DIAGNOSIS — E669 Obesity, unspecified: Secondary | ICD-10-CM | POA: Diagnosis not present

## 2022-04-20 LAB — COMPREHENSIVE METABOLIC PANEL
ALT: 29 U/L (ref 0–44)
AST: 25 U/L (ref 15–41)
Albumin: 4 g/dL (ref 3.5–5.0)
Alkaline Phosphatase: 54 U/L (ref 38–126)
Anion gap: 10 (ref 5–15)
BUN: 13 mg/dL (ref 6–20)
CO2: 24 mmol/L (ref 22–32)
Calcium: 9.4 mg/dL (ref 8.9–10.3)
Chloride: 101 mmol/L (ref 98–111)
Creatinine, Ser: 0.68 mg/dL (ref 0.44–1.00)
GFR, Estimated: >60 mL/min (ref >60)
Glucose, Bld: 97 mg/dL (ref 70–99)
Potassium: 3.9 mmol/L (ref 3.5–5.1)
Sodium: 135 mmol/L (ref 135–145)
Total Bilirubin: 0.5 mg/dL (ref 0.3–1.2)
Total Protein: 7.1 g/dL (ref 6.5–8.1)

## 2022-04-20 LAB — CBC
HCT: 37.5 % (ref 36.0–46.0)
Hemoglobin: 12.2 g/dL (ref 12.0–15.0)
MCH: 26.9 pg (ref 26.0–34.0)
MCHC: 32.5 g/dL (ref 30.0–36.0)
MCV: 82.8 fL (ref 80.0–100.0)
Platelets: 208 10*3/uL (ref 150–400)
RBC: 4.53 MIL/uL (ref 3.87–5.11)
RDW: 13.9 % (ref 11.5–15.5)
WBC: 6.9 10*3/uL (ref 4.0–10.5)
nRBC: 0 % (ref 0.0–0.2)

## 2022-04-20 LAB — BRAIN NATRIURETIC PEPTIDE: B Natriuretic Peptide: 180.4 pg/mL — ABNORMAL HIGH (ref 0.0–100.0)

## 2022-04-20 NOTE — Progress Notes (Signed)
ADVANCED HEART FAILURE CLINIC NOTE   Primary Cardiologist: Dr. Haroldine Laws   HPI: Tara Bradshaw is a 37 y.o. female w/ nonischemic cardiomyopathy s/p SICD, HTN and systolic heart failure diagnosed in 06/26/21 w/ LVEF 20-25% regularly followed by Dr. Haroldine Laws presenting again today as an acute care visit secondary to persistent shortness of breath, decreased appetite. I saw Tara Bradshaw last week where she described the following symptoms: According to Tara Bradshaw, since Christmas time she has had significant shortness of breath, decrease in exercise tolerance, worsening lower extremity edema and orthopnea. She noticed that she could no longer walk more than 7f without having to stop due to shortness of breath. In addition, sleeping on 4-5 pillows. During this time she has also noticed a 10-15lb weight gain. Patient was seen by cardiology 2 weeks ago where her lasix dose was increased to '80mg'$  with the addition of metolazone. She had minimal improvement with this.   Interval hx:  Last week I prescribed Tara Bradshaw torsemide '80mg'$  daily with metolazone 2.'5mg'$  (x3 days). She reports having increased urine output with that regimen, however, despite this she continues to feel very short of breath with intermittent chest pressure and significant 3-4 pillow orthopnea. She otherwise reports no fevers, chills, diarrhea or signs of systemic illness. She is still becoming quickly fatigued / SOB after wakling > 10-140f   Activity level/exercise tolerance:  Poor, NYHA III Orthopnea:  Sleeps on 3-4 pillows Paroxysmal noctural dyspnea:  Yes Chest pain/pressure:  No Orthostatic lightheadedness:  No Palpitations:  No Lower extremity edema:  Yes, 2+ Presyncope/syncope:  No Cough:  Yes  Past Medical History:  Diagnosis Date   AICD (automatic cardioverter/defibrillator) present    Anemia    CHRONIC   CHF (congestive heart failure) (HCC)    Chronic systolic dysfunction of left ventricle    GERD  (gastroesophageal reflux disease)    WITH PREGNANCY   Nonischemic cardiomyopathy (HCC)    Pneumonia    x 1   PVC's (premature ventricular contractions)    Sleep apnea     Current Outpatient Medications  Medication Sig Dispense Refill   acetaminophen (TYLENOL) 325 MG tablet Take 650 mg by mouth every 6 (six) hours as needed for mild pain or headache.     amLODipine (NORVASC) 10 MG tablet Take 1 tablet (10 mg total) by mouth daily. 90 tablet 3   cyclobenzaprine (FLEXERIL) 10 MG tablet Take 1 tablet (10 mg total) by mouth 3 (three) times daily as needed for muscle spasms. 10 tablet 0   dapagliflozin propanediol (FARXIGA) 10 MG TABS tablet Take 1 tablet (10 mg total) by mouth daily before breakfast. 90 tablet 3   gabapentin (NEURONTIN) 300 MG capsule Take 1 capsule (300 mg total) by mouth daily. 30 capsule 1   levonorgestrel (MIRENA) 20 MCG/24HR IUD 1 each by Intrauterine route once.     meclizine (ANTIVERT) 25 MG tablet Take 1 tablet (25 mg total) by mouth 3 (three) times daily as needed for dizziness. 30 tablet 0   metolazone (ZAROXOLYN) 2.5 MG tablet Take 1 tablet (2.'5mg'$ ) by mouth 30 minutes before your morning dose of Lasix '80mg'$  on Saturday 04/14/22. Do not take more until instructed to do so by cardiologist office. 5 tablet 0   metoprolol succinate (TOPROL-XL) 25 MG 24 hr tablet Take 1 tablet (25 mg total) by mouth at bedtime. 90 tablet 3   NON FORMULARY Pt uses a cpap nightly     potassium chloride SA (KLOR-CON M) 20 MEQ tablet Take  1 tablet (20 mEq total) by mouth 2 (two) times daily. 180 tablet 3   sacubitril-valsartan (ENTRESTO) 97-103 MG Take 1 tablet by mouth 2 (two) times daily. 180 tablet 3   Semaglutide, 1 MG/DOSE, (OZEMPIC, 1 MG/DOSE,) 4 MG/3ML SOPN Inject 1 mg into the skin once a week. 3 mL 11   spironolactone (ALDACTONE) 25 MG tablet Take 1 tablet (25 mg total) by mouth daily. 90 tablet 3   torsemide (DEMADEX) 20 MG tablet Take 4 tablets (80 mg total) by mouth daily. 120 tablet 3    No current facility-administered medications for this encounter.    No Known Allergies    Social History   Socioeconomic History   Marital status: Widowed    Spouse name: Not on file   Number of children: Not on file   Years of education:  14   Highest education level: Not on file  Occupational History   Occupation: SECURITY OFFICER    Employer: [employer]  Tobacco Use   Smoking status: Never    Passive exposure: Never   Smokeless tobacco: Never  Vaping Use   Vaping Use: Never used  Substance and Sexual Activity   Alcohol use: No   Drug use: No   Sexual activity: Yes    Partners: Male    Birth control/protection: None  Other Topics Concern   Not on file  Social History Narrative   Lives in [place] with husband and children (6,1)   Unemployed    Previously worked in [employer].   Social Determinants of Health   Financial Resource Strain: Low Risk  (12/05/2017)   Overall Financial Resource Strain (CARDIA)    Difficulty of Paying Living Expenses: Not hard at all  Food Insecurity: No Food Insecurity (12/05/2017)   Hunger Vital Sign    Worried About Running Out of Food in the Last Year: Never true    Ran Out of Food in the Last Year: Never true  Transportation Needs: No Transportation Needs (12/05/2017)   PRAPARE - Hydrologist (Medical): No    Lack of Transportation (Non-Medical): No  Physical Activity: Inactive (12/05/2017)   Exercise Vital Sign    Days of Exercise per Week: 0 days    Minutes of Exercise per Session: 0 min  Stress: Not on file  Social Connections: Not on file  Intimate Partner Violence: Not At Risk (12/05/2017)   Humiliation, Afraid, Rape, and Kick questionnaire    Fear of Current or Ex-Partner: No    Emotionally Abused: No    Physically Abused: No    Sexually Abused: No      Family History  Problem Relation Age of Onset   Epilepsy Mother    Asthma Mother    Cancer Paternal Grandmother         BREAST   Cancer Maternal Aunt 52       BREAST   Hypertension Maternal Grandmother     PHYSICAL EXAM: Vitals:   04/20/22 1000  BP: 100/70  Pulse: 77  SpO2: 99%   GENERAL: anxious  HEENT: Negative for arcus senilis or xanthelasma. There is no scleral icterus.  The mucous membranes are pink and moist.   NECK: Supple, No masses. Normal carotid upstrokes without bruits. No masses or thyromegaly.    CHEST: There are no chest wall deformities. There is no chest wall tenderness. Respirations are unlabored.  Lungs- decreased lung sounds at the bases but no crackles;  CARDIAC:  [id number] to assess; does not  appear significantly elevated.         Normal S1, S2  Normal rate with regular rhythm. No murmurs, rubs or gallops.  Pulses are 2+ and symmetrical in upper and lower extremities. 2+ non pitting edema.  ABDOMEN: Soft, non-tender, non-distended. There are no masses or hepatomegaly. There are normal bowel sounds.  EXTREMITIES: Warm and well perfused with no cyanosis, clubbing.  LYMPHATIC: No axillary or supraclavicular lymphadenopathy.  NEUROLOGIC: Patient is oriented x3 with no focal or lateralizing neurologic deficits.  PSYCH: Patients affect is appropriate, there is no evidence of anxiety or depression.  SKIN: Warm and dry; no lesions or wounds.   DATA REVIEW  ECG:NSR w/ intermittent PVC  ECHO: 10/09/21: LVEF 25%-30%, severely dilated LV; RV mildly enlarged.  03/17/20: LVEF 25%-30%; normal RV function.   CPX 03/17/20:  Submax test. No evidence of significant cardiopulmonary limitation despite LV dysfunction. When corrected to ideal body weight, measured pVO2 is normal. Suspect majority of limitation related to obesity.   ASSESSMENT & PLAN:  Acute on chronic HFrEF exacerbation - Despite 1 week of diuresis with torsemide '80mg'$  and several days of metolazone 2.5, Tara Bradshaw continues to feel very poorly. Her symptoms are concerning for low output, however, her clinical exam is not  concordant with this. Warm extremities with minimal edema. In addition, BNP now down to 180. Will continue diuresis with torsemide '80mg'$  and metolazone 2.'5mg'$  PRN with plan for RHC next week.   Tavaras Goody Advanced Heart Failure Mechanical Circulatory Support

## 2022-04-20 NOTE — H&P (View-Only) (Signed)
ADVANCED HEART FAILURE CLINIC NOTE   Primary Cardiologist: Dr. Haroldine Laws   HPI: Tara Bradshaw is a 37 y.o. female w/ nonischemic cardiomyopathy s/p SICD, HTN and systolic heart failure diagnosed in 06/26/21 w/ LVEF 20-25% regularly followed by Dr. Haroldine Laws presenting again today as an acute care visit secondary to persistent shortness of breath, decreased appetite. I saw Tara Bradshaw last week where she described the following symptoms: According to Tara Bradshaw, since Christmas time she has had significant shortness of breath, decrease in exercise tolerance, worsening lower extremity edema and orthopnea. She noticed that she could no longer walk more than 27f without having to stop due to shortness of breath. In addition, sleeping on 4-5 pillows. During this time she has also noticed a 10-15lb weight gain. Patient was seen by cardiology 2 weeks ago where her lasix dose was increased to '80mg'$  with the addition of metolazone. She had minimal improvement with this.   Interval hx:  Last week I prescribed Tara Bradshaw torsemide '80mg'$  daily with metolazone 2.'5mg'$  (x3 days). She reports having increased urine output with that regimen, however, despite this she continues to feel very short of breath with intermittent chest pressure and significant 3-4 pillow orthopnea. She otherwise reports no fevers, chills, diarrhea or signs of systemic illness. She is still becoming quickly fatigued / SOB after wakling > 10-187f   Activity level/exercise tolerance:  Poor, NYHA III Orthopnea:  Sleeps on 3-4 pillows Paroxysmal noctural dyspnea:  Yes Chest pain/pressure:  No Orthostatic lightheadedness:  No Palpitations:  No Lower extremity edema:  Yes, 2+ Presyncope/syncope:  No Cough:  Yes  Past Medical History:  Diagnosis Date   AICD (automatic cardioverter/defibrillator) present    Anemia    CHRONIC   CHF (congestive heart failure) (HCC)    Chronic systolic dysfunction of left ventricle    GERD  (gastroesophageal reflux disease)    WITH PREGNANCY   Nonischemic cardiomyopathy (HCC)    Pneumonia    x 1   PVC's (premature ventricular contractions)    Sleep apnea     Current Outpatient Medications  Medication Sig Dispense Refill   acetaminophen (TYLENOL) 325 MG tablet Take 650 mg by mouth every 6 (six) hours as needed for mild pain or headache.     amLODipine (NORVASC) 10 MG tablet Take 1 tablet (10 mg total) by mouth daily. 90 tablet 3   cyclobenzaprine (FLEXERIL) 10 MG tablet Take 1 tablet (10 mg total) by mouth 3 (three) times daily as needed for muscle spasms. 10 tablet 0   dapagliflozin propanediol (FARXIGA) 10 MG TABS tablet Take 1 tablet (10 mg total) by mouth daily before breakfast. 90 tablet 3   gabapentin (NEURONTIN) 300 MG capsule Take 1 capsule (300 mg total) by mouth daily. 30 capsule 1   levonorgestrel (MIRENA) 20 MCG/24HR IUD 1 each by Intrauterine route once.     meclizine (ANTIVERT) 25 MG tablet Take 1 tablet (25 mg total) by mouth 3 (three) times daily as needed for dizziness. 30 tablet 0   metolazone (ZAROXOLYN) 2.5 MG tablet Take 1 tablet (2.'5mg'$ ) by mouth 30 minutes before your morning dose of Lasix '80mg'$  on Saturday 04/14/22. Do not take more until instructed to do so by cardiologist office. 5 tablet 0   metoprolol succinate (TOPROL-XL) 25 MG 24 hr tablet Take 1 tablet (25 mg total) by mouth at bedtime. 90 tablet 3   NON FORMULARY Pt uses a cpap nightly     potassium chloride SA (KLOR-CON M) 20 MEQ tablet Take  1 tablet (20 mEq total) by mouth 2 (two) times daily. 180 tablet 3   sacubitril-valsartan (ENTRESTO) 97-103 MG Take 1 tablet by mouth 2 (two) times daily. 180 tablet 3   Semaglutide, 1 MG/DOSE, (OZEMPIC, 1 MG/DOSE,) 4 MG/3ML SOPN Inject 1 mg into the skin once a week. 3 mL 11   spironolactone (ALDACTONE) 25 MG tablet Take 1 tablet (25 mg total) by mouth daily. 90 tablet 3   torsemide (DEMADEX) 20 MG tablet Take 4 tablets (80 mg total) by mouth daily. 120 tablet 3    No current facility-administered medications for this encounter.    No Known Allergies    Social History   Socioeconomic History   Marital status: Widowed    Spouse name: Not on file   Number of children: Not on file   Years of education:  14   Highest education level: Not on file  Occupational History   Occupation: SECURITY OFFICER    Employer: [id number]  Tobacco Use   Smoking status: Never    Passive exposure: Never   Smokeless tobacco: Never  Vaping Use   Vaping Use: Never used  Substance and Sexual Activity   Alcohol use: No   Drug use: No   Sexual activity: Yes    Partners: Male    Birth control/protection: None  Other Topics Concern   Not on file  Social History Narrative   Lives in [place] with husband and children (6,1)   Unemployed    Previously worked in [employer].   Social Determinants of Health   Financial Resource Strain: Low Risk  (12/05/2017)   Overall Financial Resource Strain (CARDIA)    Difficulty of Paying Living Expenses: Not hard at all  Food Insecurity: No Food Insecurity (12/05/2017)   Hunger Vital Sign    Worried About Running Out of Food in the Last Year: Never true    Ran Out of Food in the Last Year: Never true  Transportation Needs: No Transportation Needs (12/05/2017)   PRAPARE - Hydrologist (Medical): No    Lack of Transportation (Non-Medical): No  Physical Activity: Inactive (12/05/2017)   Exercise Vital Sign    Days of Exercise per Week: 0 days    Minutes of Exercise per Session: 0 min  Stress: Not on file  Social Connections: Not on file  Intimate Partner Violence: Not At Risk (12/05/2017)   Humiliation, Afraid, Rape, and Kick questionnaire    Fear of Current or Ex-Partner: No    Emotionally Abused: No    Physically Abused: No    Sexually Abused: No      Family History  Problem Relation Age of Onset   Epilepsy Mother    Asthma Mother    Cancer Paternal Grandmother         BREAST   Cancer Maternal Aunt 52       BREAST   Hypertension Maternal Grandmother     PHYSICAL EXAM: Vitals:   04/20/22 1000  BP: 100/70  Pulse: 77  SpO2: 99%   GENERAL: anxious  HEENT: Negative for arcus senilis or xanthelasma. There is no scleral icterus.  The mucous membranes are pink and moist.   NECK: Supple, No masses. Normal carotid upstrokes without bruits. No masses or thyromegaly.    CHEST: There are no chest wall deformities. There is no chest wall tenderness. Respirations are unlabored.  Lungs- decreased lung sounds at the bases but no crackles;  CARDIAC:  [id number] to assess; does not  appear significantly elevated.         Normal S1, S2  Normal rate with regular rhythm. No murmurs, rubs or gallops.  Pulses are 2+ and symmetrical in upper and lower extremities. 2+ non pitting edema.  ABDOMEN: Soft, non-tender, non-distended. There are no masses or hepatomegaly. There are normal bowel sounds.  EXTREMITIES: Warm and well perfused with no cyanosis, clubbing.  LYMPHATIC: No axillary or supraclavicular lymphadenopathy.  NEUROLOGIC: Patient is oriented x3 with no focal or lateralizing neurologic deficits.  PSYCH: Patients affect is appropriate, there is no evidence of anxiety or depression.  SKIN: Warm and dry; no lesions or wounds.   DATA REVIEW  ECG:NSR w/ intermittent PVC  ECHO: 10/09/21: LVEF 25%-30%, severely dilated LV; RV mildly enlarged.  03/17/20: LVEF 25%-30%; normal RV function.   CPX 03/17/20:  Submax test. No evidence of significant cardiopulmonary limitation despite LV dysfunction. When corrected to ideal body weight, measured pVO2 is normal. Suspect majority of limitation related to obesity.   ASSESSMENT & PLAN:  Acute on chronic HFrEF exacerbation - Despite 1 week of diuresis with torsemide '80mg'$  and several days of metolazone 2.5, Tara Bradshaw continues to feel very poorly. Her symptoms are concerning for low output, however, her clinical exam is not  concordant with this. Warm extremities with minimal edema. In addition, BNP now down to 180. Will continue diuresis with torsemide '80mg'$  and metolazone 2.'5mg'$  PRN with plan for RHC next week.   Jake Fuhrmann Advanced Heart Failure Mechanical Circulatory Support

## 2022-04-20 NOTE — Progress Notes (Signed)
ReDS Vest / Clip - 04/20/22 1000       ReDS Vest / Clip   Station Marker B    Ruler Value 33    ReDS Value Range High volume overload    ReDS Actual Value 38

## 2022-04-20 NOTE — Patient Instructions (Signed)
There has been no changes to your medications.  Labs done today, your results will be available in MyChart, we will contact you for abnormal readings.  You are scheduled for a Cardiac Catheterization on Tuesday, January 16 with Dr. Glori Bickers.  1. Please arrive at the [facility] (Main Entrance A) at [facility]: [address] [place], [place] [address] at 11:30 AM (This time is two hours before your procedure to ensure your preparation). Free valet parking service is available.   Special note: Every effort is made to have your procedure done on time. Please understand that emergencies sometimes delay scheduled procedures.  2. Diet: Do not eat solid foods after midnight.  The patient may have clear liquids until 5am upon the day of the procedure.  3. Medication instructions in preparation for your procedure:   Contrast Allergy: No  HOLD Torsemide, Spironolactone and Farxiga the morning of your procedure.   On the morning of your procedure, take any morning medicines NOT listed above.  You may use sips of water.  5. Plan for one night stay--bring personal belongings. 6. Bring a current list of your medications and current insurance cards. 7. You MUST have a responsible person to drive you home. 8. Someone MUST be with you the first 24 hours after you arrive home or your discharge will be delayed. 9. Please wear clothes that are easy to get on and off and wear slip-on shoes.  Your physician recommends that you schedule a follow-up appointment in: will be scheduled after your procedure.  If you have any questions or concerns before your next appointment please send [place] a message through [facility] or call our office at [phone].    TO LEAVE A MESSAGE FOR THE NURSE SELECT OPTION 2, PLEASE LEAVE A MESSAGE INCLUDING: YOUR NAME DATE OF BIRTH CALL BACK NUMBER REASON FOR CALL**this is important as we prioritize the call backs  YOU WILL RECEIVE A CALL BACK THE SAME DAY AS  LONG AS YOU CALL BEFORE 4:00 PM  At the [facility], you and your health needs are our priority. As part of our continuing mission to provide you with exceptional heart care, we have created designated Provider Care Teams. These Care Teams include your primary Cardiologist (physician) and Advanced Practice Providers (APPs- Physician Assistants and Nurse Practitioners) who all work together to provide you with the care you need, when you need it.   You may see any of the following providers on your designated Care Team at your next follow up: Dr Glori Bickers Dr Loralie Champagne Dr. Roxana Hires, NP Lyda Jester, [place] [facility] [place], [place] Forestine Na, NP Audry Riles, PharmD   Please be sure to bring in all your medications bottles to every appointment.

## 2022-04-24 ENCOUNTER — Other Ambulatory Visit: Payer: Self-pay

## 2022-04-24 ENCOUNTER — Other Ambulatory Visit (HOSPITAL_COMMUNITY): Payer: Self-pay

## 2022-04-24 ENCOUNTER — Encounter (HOSPITAL_COMMUNITY)
Admission: RE | Disposition: A | Discharge status: Home / Self Care | Payer: Self-pay | Source: Ambulatory Visit | Attending: Internal Medicine

## 2022-04-24 ENCOUNTER — Encounter (HOSPITAL_COMMUNITY): Payer: Self-pay | Admitting: Internal Medicine

## 2022-04-24 ENCOUNTER — Inpatient Hospital Stay (HOSPITAL_COMMUNITY)
Admit: 2022-04-24 | Discharge: 2022-04-24 | Disposition: A | Discharge status: Home / Self Care | Payer: Medicare Other | Attending: Internal Medicine | Admitting: Internal Medicine

## 2022-04-24 ENCOUNTER — Ambulatory Visit (HOSPITAL_COMMUNITY)
Admission: RE | Admit: 2022-04-24 | Discharge: 2022-04-24 | Disposition: A | Discharge status: Home / Self Care | Payer: Medicare Other | Source: Ambulatory Visit | Attending: Internal Medicine | Admitting: Internal Medicine

## 2022-04-24 DIAGNOSIS — I493 Ventricular premature depolarization: Secondary | ICD-10-CM

## 2022-04-24 DIAGNOSIS — I5022 Chronic systolic (congestive) heart failure: Secondary | ICD-10-CM | POA: Diagnosis not present

## 2022-04-24 DIAGNOSIS — I5023 Acute on chronic systolic (congestive) heart failure: Secondary | ICD-10-CM | POA: Diagnosis not present

## 2022-04-24 DIAGNOSIS — I110 Hypertensive heart disease with heart failure: Secondary | ICD-10-CM | POA: Insufficient documentation

## 2022-04-24 DIAGNOSIS — Z79899 Other long term (current) drug therapy: Secondary | ICD-10-CM | POA: Diagnosis not present

## 2022-04-24 DIAGNOSIS — I428 Other cardiomyopathies: Secondary | ICD-10-CM | POA: Diagnosis not present

## 2022-04-24 HISTORY — PX: RIGHT HEART CATH: CATH118263

## 2022-04-24 LAB — POCT I-STAT EG7
Acid-base deficit: 2 mmol/L (ref 0.0–2.0)
Acid-base deficit: 3 mmol/L — ABNORMAL HIGH (ref 0.0–2.0)
Acid-base deficit: 3 mmol/L — ABNORMAL HIGH (ref 0.0–2.0)
Bicarbonate: 21.4 mmol/L (ref 20.0–28.0)
Bicarbonate: 22.1 mmol/L (ref 20.0–28.0)
Bicarbonate: 22.5 mmol/L (ref 20.0–28.0)
Calcium, Ion: 1.16 mmol/L (ref 1.15–1.40)
Calcium, Ion: 1.2 mmol/L (ref 1.15–1.40)
Calcium, Ion: 1.26 mmol/L (ref 1.15–1.40)
HCT: 32 % — ABNORMAL LOW (ref 36.0–46.0)
HCT: 33 % — ABNORMAL LOW (ref 36.0–46.0)
HCT: 34 % — ABNORMAL LOW (ref 36.0–46.0)
Hemoglobin: 10.9 g/dL — ABNORMAL LOW (ref 12.0–15.0)
Hemoglobin: 11.2 g/dL — ABNORMAL LOW (ref 12.0–15.0)
Hemoglobin: 11.6 g/dL — ABNORMAL LOW (ref 12.0–15.0)
O2 Saturation: 70 %
O2 Saturation: 73 %
O2 Saturation: 80 %
Potassium: 3.5 mmol/L (ref 3.5–5.1)
Potassium: 3.6 mmol/L (ref 3.5–5.1)
Potassium: 3.7 mmol/L (ref 3.5–5.1)
Sodium: 140 mmol/L (ref 135–145)
Sodium: 141 mmol/L (ref 135–145)
Sodium: 142 mmol/L (ref 135–145)
TCO2: 22 mmol/L (ref 22–32)
TCO2: 23 mmol/L (ref 22–32)
TCO2: 24 mmol/L (ref 22–32)
pCO2, Ven: 35 mmHg — ABNORMAL LOW (ref 44–60)
pCO2, Ven: 36.9 mmHg — ABNORMAL LOW (ref 44–60)
pCO2, Ven: 38.4 mmHg — ABNORMAL LOW (ref 44–60)
pH, Ven: 7.377 (ref 7.25–7.43)
pH, Ven: 7.385 (ref 7.25–7.43)
pH, Ven: 7.394 (ref 7.25–7.43)
pO2, Ven: 37 mmHg (ref 32–45)
pO2, Ven: 39 mmHg (ref 32–45)
pO2, Ven: 44 mmHg (ref 32–45)

## 2022-04-24 LAB — PREGNANCY, URINE: Preg Test, Ur: NEGATIVE

## 2022-04-24 SURGERY — RIGHT HEART CATH
Anesthesia: LOCAL

## 2022-04-24 MED ORDER — SODIUM CHLORIDE 0.9% FLUSH: 3.0000 mL | INTRAVENOUS | Status: DC | PRN

## 2022-04-24 MED ORDER — SODIUM CHLORIDE 0.9 % IV SOLN: 250.0000 mL | INTRAVENOUS | Status: DC | PRN

## 2022-04-24 MED ORDER — ONDANSETRON HCL 4 MG/2ML IJ SOLN: 4.0000 mg | Freq: Four times a day (QID) | INTRAMUSCULAR | Status: DC | PRN

## 2022-04-24 MED ORDER — FUROSEMIDE 10 MG/ML IJ SOLN
INTRAMUSCULAR | Status: AC
  Filled 2022-04-24: qty 4

## 2022-04-24 MED ORDER — METOLAZONE 2.5 MG PO TABS: 2.5000 mg | ORAL_TABLET | ORAL | 3 refills | Status: DC

## 2022-04-24 MED ORDER — FUROSEMIDE 10 MG/ML IJ SOLN
INTRAMUSCULAR | Status: DC | PRN
  Administered 2022-04-24: 80 mg via INTRAVENOUS

## 2022-04-24 MED ORDER — SODIUM CHLORIDE 0.9% FLUSH: 3.0000 mL | Freq: Two times a day (BID) | INTRAVENOUS | Status: DC

## 2022-04-24 MED ORDER — HEPARIN (PORCINE) IN NACL 1000-0.9 UT/500ML-% IV SOLN
INTRAVENOUS | Status: DC | PRN
  Administered 2022-04-24 (×2): 500 mL

## 2022-04-24 MED ORDER — SODIUM CHLORIDE 0.9 % IV SOLN: INTRAVENOUS | Status: DC

## 2022-04-24 MED ORDER — POTASSIUM CHLORIDE CRYS ER 20 MEQ PO TBCR: 40.0000 meq | EXTENDED_RELEASE_TABLET | ORAL | 3 refills | Status: DC

## 2022-04-24 MED ORDER — ACETAMINOPHEN 325 MG PO TABS: 650.0000 mg | ORAL_TABLET | ORAL | Status: DC | PRN

## 2022-04-24 MED ORDER — HYDRALAZINE HCL 20 MG/ML IJ SOLN: 10.0000 mg | INTRAMUSCULAR | Status: DC | PRN

## 2022-04-24 MED ORDER — LIDOCAINE HCL (PF) 1 % IJ SOLN
INTRAMUSCULAR | Status: AC
  Filled 2022-04-24: qty 30

## 2022-04-24 MED ORDER — LIDOCAINE HCL (PF) 1 % IJ SOLN
INTRAMUSCULAR | Status: DC | PRN
  Administered 2022-04-24: 2 mL

## 2022-04-24 MED ORDER — LABETALOL HCL 5 MG/ML IV SOLN: 10.0000 mg | INTRAVENOUS | Status: DC | PRN

## 2022-04-24 MED ORDER — HEPARIN (PORCINE) IN NACL 1000-0.9 UT/500ML-% IV SOLN
INTRAVENOUS | Status: AC
  Filled 2022-04-24: qty 500

## 2022-04-24 SURGICAL SUPPLY — 5 items
CATH SWAN GANZ 7F STRAIGHT (CATHETERS) IMPLANT
GLIDESHEATH SLENDER 7FR .021G (SHEATH) IMPLANT
GUIDEWIRE .025 260CM (WIRE) IMPLANT
PACK CARDIAC CATHETERIZATION (CUSTOM PROCEDURE TRAY) ×2 IMPLANT
TRANSDUCER W/STOPCOCK (MISCELLANEOUS) ×2 IMPLANT

## 2022-04-24 NOTE — Discharge Instructions (Signed)
Keep bandage on for 24 hours and then you can shower. Watch for signs of infection (fever, redness, drainage, etc.) and bleeding. If so, call doctor. No lifting anything heavier than 10 pounds.

## 2022-04-24 NOTE — Interval H&P Note (Signed)
History and Physical Interval Note:  04/24/2022 1:49 PM  Tara Bradshaw  has presented today for surgery, with the diagnosis of CHF.  The various methods of treatment have been discussed with the patient and family. After consideration of risks, benefits and other options for treatment, the patient has consented to  Procedure(s): RIGHT HEART CATH (N/A) as a surgical intervention.  The patient's history has been reviewed, patient examined, no change in status, stable for surgery.  I have reviewed the patient's chart and labs.  Questions were answered to the patient's satisfaction.     [name]

## 2022-05-07 ENCOUNTER — Encounter: Payer: Self-pay | Admitting: Pharmacist

## 2022-05-07 ENCOUNTER — Ambulatory Visit: Payer: Medicaid Other | Attending: Cardiology

## 2022-05-07 DIAGNOSIS — Z95810 Presence of automatic (implantable) cardiac defibrillator: Secondary | ICD-10-CM | POA: Diagnosis not present

## 2022-05-07 DIAGNOSIS — I5022 Chronic systolic (congestive) heart failure: Secondary | ICD-10-CM

## 2022-05-07 NOTE — Progress Notes (Signed)
EPIC Encounter for ICM Monitoring  Patient Name: Tara Bradshaw is a 37 y.o. female Date: 05/07/2022 Primary Care Physican: Patient, No Pcp Per Primary Cardiologist: Tara Bradshaw Electrophysiologist: Tara Bradshaw 10/23/2021 Office Weight: 263 lbs   01/22/2022 Weight: 255 lbs 05/03/2022 Weight: 253 lbs 05/07/2022 Weight: 255 lbs                   Spoke with patient and heart failure questions reviewed.  Transmission results reviewed.  Pt reports Dr Tara Bradshaw cath results showed she had 8-10 lbs of fluid which correlates with decreased impedance.  Diuretic changed to Torsemide and taking Metolazone weekly.   She did have a GI virus over the weekend which correlates with impedance suggesting possible dryness.  Discussed how to take Metolazone weekly.  She does feel "washed out" at times since starting Torsemide and Metolazone.    Corvue thoracic impedance suggesting possible fluid accumulation from 1/13-1/24 followed by possible dryness starting 1/25.  Returned close to baseline 1/25.      Prescribed: Furosemide 40 mg take 1 tablet by mouth twice a day. Potassium 20 mEq take 1 tablet daily.    Labs: 01/01/2022 Creatinine 0.76, BUN 8,   Potassium 3.9, Sodium 138, GFR >60 10/23/2021 Creatinine 0.53, BUN 6,   Potassium 3.9, Sodium 136, GFR >60 05/05/2021 Creatinine 0.69, BUN 9,   Potassium 4.4, Sodium 138, GFR 115 A complete set of results can be found in Results Review.   Recommendations:  Advised to call if she has changes in condition or call Dr Tara Bradshaw's office directly if needed.    Follow-up plan: ICM clinic phone appointment on 05/14/2022 to recheck fluid levels.   91 day device clinic remote transmission 06/01/2022.       EP/Cardiology Office Visits:  Recall 04/21/2022 with HF clinic.  Recall 10/18/2022 with Tara Standard, PA.   Copy of ICM check sent to Dr. Quentin Bradshaw.   3 month ICM trend: 05/07/2022.    12-14 Month ICM trend:     Tara Billings, RN 05/07/2022 4:21 PM

## 2022-05-08 MED ORDER — SEMAGLUTIDE(0.25 OR 0.5MG/DOS) 2 MG/3ML ~~LOC~~ SOPN: PEN_INJECTOR | SUBCUTANEOUS | 1 refills | Status: DC

## 2022-05-11 DIAGNOSIS — I493 Ventricular premature depolarization: Secondary | ICD-10-CM | POA: Diagnosis not present

## 2022-05-14 ENCOUNTER — Ambulatory Visit: Payer: Medicaid Other | Attending: Cardiology

## 2022-05-14 ENCOUNTER — Telehealth: Payer: Self-pay

## 2022-05-14 DIAGNOSIS — I5022 Chronic systolic (congestive) heart failure: Secondary | ICD-10-CM

## 2022-05-14 DIAGNOSIS — Z95810 Presence of automatic (implantable) cardiac defibrillator: Secondary | ICD-10-CM

## 2022-05-14 NOTE — Addendum Note (Signed)
Encounter addended by: Micki Riley, RN on: 05/14/2022 1:29 PM  Actions taken: Imaging Exam ended

## 2022-05-14 NOTE — Progress Notes (Signed)
EPIC Encounter for ICM Monitoring  Patient Name: Tara Bradshaw is a 37 y.o. female Date: 05/14/2022 Primary Care Physican: Patient, No Pcp Per Primary Cardiologist: [place] Electrophysiologist: Quentin Ore 10/23/2021 Office Weight: 263 lbs   01/22/2022 Weight: 255 lbs 05/03/2022 Weight: 253 lbs 05/07/2022 Weight: 255 lbs                   Attempted call to patient and unable to reach.  Transmission reviewed.     Corvue thoracic impedance suggesting possible fluid accumulation returned 1/31.    Fluid levels unstable since 12/30.   Prescribed: Furosemide 40 mg take 1 tablet by mouth twice a day. Potassium 20 mEq take 1 tablet daily Metolazone 2.5 mg take 1 tablet by mouth once a week on Wednesday.    Labs: 01/01/2022 Creatinine 0.76, BUN 8,   Potassium 3.9, Sodium 138, GFR >60 10/23/2021 Creatinine 0.53, BUN 6,   Potassium 3.9, Sodium 136, GFR >60 05/05/2021 Creatinine 0.69, BUN 9,   Potassium 4.4, Sodium 138, GFR 115 A complete set of results can be found in Results Review.   Recommendations:  Unable to reach.     Follow-up plan: ICM clinic phone appointment on 05/25/2022 to recheck fluid levels.   91 day device clinic remote transmission 06/01/2022.       EP/Cardiology Office Visits:  Recall 04/21/2022 with HF clinic.  Recall 10/18/2022 with Tommye Standard, PA.   Copy of ICM check sent to Dr. Quentin Ore.  Will send to Dr Jeffie Pollock for review if patient is reached.    3 month ICM trend: 05/13/2022.    12-14 Month ICM trend:     [name], RN 05/14/2022 2:15 PM

## 2022-05-14 NOTE — Telephone Encounter (Signed)
Remote ICM transmission received.  Attempted call to patient regarding ICM remote transmission and no answer.  

## 2022-05-25 ENCOUNTER — Ambulatory Visit (INDEPENDENT_AMBULATORY_CARE_PROVIDER_SITE_OTHER): Payer: 59

## 2022-05-29 NOTE — Progress Notes (Signed)
EPIC Encounter for ICM Monitoring  Patient Name: Tara Bradshaw is a 38 y.o. female Date: 05/29/2022 Primary Care Physican: Patient, No Pcp Per Primary Cardiologist: [place] Electrophysiologist: Quentin Ore 10/23/2021 Office Weight: 263 lbs   01/22/2022 Weight: 255 lbs 05/03/2022 Weight: 253 lbs 05/07/2022 Weight: 255 lbs                   Transmission reviewed.     Corvue thoracic impedance suggesting fluid levels returned to normal.   Prescribed: Furosemide 40 mg take 1 tablet by mouth twice a day. Potassium 20 mEq take 1 tablet daily Metolazone 2.5 mg take 1 tablet by mouth once a week on Wednesday.    Labs: 01/01/2022 Creatinine 0.76, BUN 8,   Potassium 3.9, Sodium 138, GFR >60 10/23/2021 Creatinine 0.53, BUN 6,   Potassium 3.9, Sodium 136, GFR >60 05/05/2021 Creatinine 0.69, BUN 9,   Potassium 4.4, Sodium 138, GFR 115 A complete set of results can be found in Results Review.   Recommendations:  No changes.   Follow-up plan: ICM clinic phone appointment on 06/18/2022.   91 day device clinic remote transmission 06/01/2022.       EP/Cardiology Office Visits:  Recall 04/21/2022 with HF clinic.  Recall 10/18/2022 with Tommye Standard, PA.   Copy of ICM check sent to Dr. Quentin Ore.  3 month ICM trend: 05/24/2022.    12-14 Month ICM trend:     [name], RN 05/29/2022 5:04 PM

## 2022-06-01 ENCOUNTER — Ambulatory Visit: Payer: 59

## 2022-06-01 DIAGNOSIS — I428 Other cardiomyopathies: Secondary | ICD-10-CM

## 2022-06-01 LAB — CUP PACEART REMOTE DEVICE CHECK
Battery Remaining Longevity: 76 mo
Battery Remaining Percentage: 69 %
Battery Voltage: 2.98 V
Brady Statistic RV Percent Paced: 1 %
Date Time Interrogation Session: 20240222210017
HighPow Impedance: 66 Ohm
Implantable Lead Connection Status: 753985
Implantable Lead Implant Date: 20210107
Implantable Lead Location: 753860
Implantable Pulse Generator Implant Date: 20201112
Lead Channel Impedance Value: 310 Ohm
Lead Channel Pacing Threshold Amplitude: 1.75 V
Lead Channel Pacing Threshold Pulse Width: 0.5 ms
Lead Channel Sensing Intrinsic Amplitude: 10.6 mV
Lead Channel Setting Pacing Amplitude: 3.5 V
Lead Channel Setting Pacing Pulse Width: 0.5 ms
Lead Channel Setting Sensing Sensitivity: 0.5 mV
Pulse Gen Serial Number: 111012702
Zone Setting Status: 755011

## 2022-06-11 ENCOUNTER — Ambulatory Visit: Payer: 59 | Admitting: Surgery

## 2022-06-18 ENCOUNTER — Ambulatory Visit: Payer: 59 | Attending: Cardiology

## 2022-06-18 DIAGNOSIS — Z95810 Presence of automatic (implantable) cardiac defibrillator: Secondary | ICD-10-CM

## 2022-06-18 DIAGNOSIS — I5022 Chronic systolic (congestive) heart failure: Secondary | ICD-10-CM

## 2022-06-19 ENCOUNTER — Telehealth: Payer: Self-pay

## 2022-06-19 NOTE — Telephone Encounter (Signed)
Remote ICM transmission received.  Attempted call to patient regarding ICM remote transmission and no answer and mail box is full.

## 2022-06-19 NOTE — Progress Notes (Signed)
EPIC Encounter for ICM Monitoring  Patient Name: Tara Bradshaw is a 37 y.o. female Date: 06/19/2022 Primary Care Physican: Patient, No Pcp Per Primary Cardiologist: [place] Electrophysiologist: Quentin Ore 10/23/2021 Office Weight: 263 lbs   01/22/2022 Weight: 255 lbs 05/03/2022 Weight: 253 lbs 05/07/2022 Weight: 255 lbs                   Attempted call to patient and unable to reach.   Transmission reviewed.      Corvue thoracic impedance normal but was suggesting possible fluid accumulation from 2/26-3/8.       Prescribed: Torsemide 20 mg take 4 tablet(s) (80 mg total) by mouth once a day. Potassium 20 mEq take 2 tablet(s) (40 mEq total) by mouth once a week on Wednesday Metolazone 2.5 mg take 1 tablet by mouth once a week on Wednesday.  Spironolactone 25 mg take 1 tablet by mouth daily   Labs: 04/20/2022 Creatinine 0.68, BUN 13, Potassium 3.9, Sodium 135, GFR >60  04/16/2022 Creatinine 0.72, BUN 8,   Potassium 3.4, Sodium 136, GFR >60  04/11/2022 Creatinine 0.62, BUN 6,   Potassium 3.3, Sodium 137, GFR >60  01/01/2022 Creatinine 0.76, BUN 8,   Potassium 3.9, Sodium 138, GFR >60 10/23/2021 Creatinine 0.53, BUN 6,   Potassium 3.9, Sodium 136, GFR >60 05/05/2021 Creatinine 0.69, BUN 9,   Potassium 4.4, Sodium 138, GFR 115 A complete set of results can be found in Results Review.   Recommendations: Unable to reach.     Follow-up plan: ICM clinic phone appointment on 07/23/2022.   91 day device clinic remote transmission 08/31/2022.       EP/Cardiology Office Visits:  No recalls for HF clinic following heart cath on 04/24/2022.  Recall 10/18/2022 with Tommye Standard, PA.   Copy of ICM check sent to Dr. Quentin Ore.  3 month ICM trend: 06/17/2022.    12-14 Month ICM trend:     [name], RN 06/19/2022 8:56 AM

## 2022-06-25 NOTE — Progress Notes (Signed)
Remote ICD transmission.   

## 2022-07-23 ENCOUNTER — Ambulatory Visit: Payer: 59 | Attending: Cardiology

## 2022-07-23 DIAGNOSIS — Z95810 Presence of automatic (implantable) cardiac defibrillator: Secondary | ICD-10-CM | POA: Diagnosis not present

## 2022-07-23 DIAGNOSIS — I5022 Chronic systolic (congestive) heart failure: Secondary | ICD-10-CM | POA: Diagnosis not present

## 2022-07-24 ENCOUNTER — Telehealth: Payer: Self-pay

## 2022-07-24 NOTE — Progress Notes (Signed)
EPIC Encounter for ICM Monitoring  Patient Name: Tara Bradshaw is a 37 y.o. female Date: 07/24/2022 Primary Care Physican: Patient, No Pcp Per Primary Cardiologist: [place] Electrophysiologist: Lalla Brothers 10/23/2021 Office Weight: 263 lbs   01/22/2022 Weight: 255 lbs 05/03/2022 Weight: 253 lbs 05/07/2022 Weight: 255 lbs                  Attempted call to patient and unable to reach.   Transmission reviewed.    Corvue thoracic impedance suggesting normal fluid levels.       Prescribed: Torsemide 20 mg take 4 tablet(s) (80 mg total) by mouth once a day. Potassium 20 mEq take 2 tablet(s) (40 mEq total) by mouth once a week on Wednesday Metolazone 2.5 mg take 1 tablet by mouth once a week on Wednesday.  Spironolactone 25 mg take 1 tablet by mouth daily   Labs: 04/20/2022 Creatinine 0.68, BUN 13, Potassium 3.9, Sodium 135, GFR >60  04/16/2022 Creatinine 0.72, BUN 8,   Potassium 3.4, Sodium 136, GFR >60  04/11/2022 Creatinine 0.62, BUN 6,   Potassium 3.3, Sodium 137, GFR >60  01/01/2022 Creatinine 0.76, BUN 8,   Potassium 3.9, Sodium 138, GFR >60 10/23/2021 Creatinine 0.53, BUN 6,   Potassium 3.9, Sodium 136, GFR >60 05/05/2021 Creatinine 0.69, BUN 9,   Potassium 4.4, Sodium 138, GFR 115 A complete set of results can be found in Results Review.   Recommendations: Unable to reach.      Follow-up plan: ICM clinic phone appointment on 08/27/2022.   91 day device clinic remote transmission 08/31/2022.       EP/Cardiology Office Visits: Recall 04/21/2022 with HF clinic.  Recall 10/18/2022 with Francis Dowse, PA.   Copy of ICM check sent to Dr. Lalla Brothers.  3 month ICM trend: 07/23/2022.    12-14 Month ICM trend:     Karie Soda, RN 07/24/2022 9:46 AM

## 2022-07-24 NOTE — Telephone Encounter (Signed)
Remote ICM transmission received.  Attempted call to patient regarding ICM remote transmission and mail box is full. 

## 2022-08-11 ENCOUNTER — Telehealth: Payer: Self-pay | Admitting: Cardiology

## 2022-08-11 NOTE — Telephone Encounter (Signed)
Patient called in reporting increasing dyspnea, nonproductive cough and weight gain over the past week and a half.  She is followed in the advanced heart failure clinic.  History of nonischemic cardiomyopathy status post SICD.  Developed shortness of breath and cough last Saturday, continue to use her torsemide with some improvement.  States last evening she developed worsening shortness of breath with nonproductive cough.  Current weight has been running around 257 pounds.  Her diuretic regimen was initially torsemide 80 mg daily along with metolazone 2.5 mg as needed.  She underwent right heart cath 04/24/2022 with biventricular heart failure with elevating filling pressures.  She was given IV Lasix 80 mg x 1.  Instructed to continue torsemide 20 mg 4 times daily as she was unable to tolerate 40 mg twice daily due to lightheadedness.  Also instructed to take metolazone 2.5 mg plus potassium every Wednesday.  She has currently been taking torsemide 20 mg twice daily as well as metolazone as needed.  Advised she could attempt to increase her torsemide to 40mg  BID, take an additional 20 mg x 1 now and follow urine output. Instructed low threshold to be seen for further evaluation as her medication regimen has been difficult secondary to lightheadedness and fatigue.  She voiced understanding and thanked me for call back.

## 2022-08-22 ENCOUNTER — Telehealth (HOSPITAL_COMMUNITY): Payer: Self-pay

## 2022-08-22 ENCOUNTER — Other Ambulatory Visit (HOSPITAL_COMMUNITY): Payer: Self-pay

## 2022-08-22 ENCOUNTER — Other Ambulatory Visit (HOSPITAL_COMMUNITY): Payer: Self-pay | Admitting: Internal Medicine

## 2022-08-22 DIAGNOSIS — I5022 Chronic systolic (congestive) heart failure: Secondary | ICD-10-CM

## 2022-08-22 NOTE — Telephone Encounter (Signed)
Spoke with patient and advised HF clinic would like to review a remote transmission to check for fluid accumulation.  She is having a lot of SOB and feeling weak from taking Metolazone.    She has not taken today's Torsemide dosage or the Metolazone (due on Wednesday) today.  Advised HF Clinic will review report and call her with any recommendations.    5/15 CorVue thoracic impedance suggesting possible fluid accumulation starting 5/11 and worsening in the last 2 days.

## 2022-08-22 NOTE — Telephone Encounter (Signed)
Looks like she has signs of fluid build up. She should take the Torsemide and she is due for her weekly metolazone. Would have her come in for CMET/BNP and/or symptoms not improving with metoalzone she will need sooner appt

## 2022-08-22 NOTE — Telephone Encounter (Signed)
Patient called in concerned about fluid.  She has a terrible cough, which makes her short of breath, some swelling in her feet. She says she is on Metolazone weekly, but she says that makes her feel very faint when she takes it.  I asked if she had a seen a PCP for the cough, but she has not - she thinks it is fluid related.  Please advise.  She did set up an appointment with APP.

## 2022-08-22 NOTE — Telephone Encounter (Signed)
Spoke with patient and updated. Labs ordered and scheduled.

## 2022-08-22 NOTE — Telephone Encounter (Signed)
Would interrogate ICD to see if there is evidence of volume overload

## 2022-08-23 ENCOUNTER — Other Ambulatory Visit (HOSPITAL_COMMUNITY): Payer: 59

## 2022-08-23 ENCOUNTER — Telehealth (HOSPITAL_COMMUNITY): Payer: Self-pay | Admitting: Cardiology

## 2022-08-23 MED ORDER — MECLIZINE HCL 25 MG PO TABS: 25.0000 mg | ORAL_TABLET | Freq: Three times a day (TID) | ORAL | 0 refills | Status: DC | PRN

## 2022-08-23 NOTE — Telephone Encounter (Signed)
Pt aware and voiced understanding 

## 2022-08-23 NOTE — Telephone Encounter (Signed)
Labs will help determine if AKI or electrolyte disturbance may be contributing to symptoms.   She should monitor BP at home. Medicaid may cover cost of BP, if not we could provide one.

## 2022-08-23 NOTE — Telephone Encounter (Signed)
Patient called to report she feels poorly after taking torsemide and metolazone. Feels sluggish, weak, Nausea,dizzy No b/p to report no cuff at home Unable to weigh   Scheduled for labs 5/16 however rescheduled for 5/17 due to above symptoms  Please advise

## 2022-08-24 ENCOUNTER — Ambulatory Visit (HOSPITAL_COMMUNITY)
Admission: RE | Admit: 2022-08-24 | Discharge: 2022-08-24 | Disposition: A | Discharge status: Home / Self Care | Payer: 59 | Source: Ambulatory Visit | Attending: Cardiology | Admitting: Cardiology

## 2022-08-24 DIAGNOSIS — I5022 Chronic systolic (congestive) heart failure: Secondary | ICD-10-CM | POA: Diagnosis not present

## 2022-08-24 LAB — BRAIN NATRIURETIC PEPTIDE: B Natriuretic Peptide: 70.2 pg/mL (ref 0.0–100.0)

## 2022-08-24 LAB — BASIC METABOLIC PANEL
Anion gap: 13 (ref 5–15)
BUN: 17 mg/dL (ref 6–20)
CO2: 22 mmol/L (ref 22–32)
Calcium: 9.6 mg/dL (ref 8.9–10.3)
Chloride: 102 mmol/L (ref 98–111)
Creatinine, Ser: 0.93 mg/dL (ref 0.44–1.00)
GFR, Estimated: >60 mL/min (ref >60)
Glucose, Bld: 105 mg/dL — ABNORMAL HIGH (ref 70–99)
Potassium: 3.5 mmol/L (ref 3.5–5.1)
Sodium: 137 mmol/L (ref 135–145)

## 2022-08-27 ENCOUNTER — Telehealth (HOSPITAL_COMMUNITY): Payer: Self-pay

## 2022-08-27 ENCOUNTER — Ambulatory Visit: Payer: 59 | Attending: Cardiology

## 2022-08-27 DIAGNOSIS — I5022 Chronic systolic (congestive) heart failure: Secondary | ICD-10-CM

## 2022-08-27 DIAGNOSIS — Z95810 Presence of automatic (implantable) cardiac defibrillator: Secondary | ICD-10-CM | POA: Diagnosis not present

## 2022-08-27 NOTE — Telephone Encounter (Signed)
-----   Message from Andrey Farmer, [place] sent at 08/27/2022  1:32 PM EDT ----- Looks like patient may now be getting dry. Please confirm what she is taking. Looks like she has been on torsemide, furosemide and metolazone.   She should not take torsemide and furosemide. Take only Torsemide. Do not take anymore metolazone for now. No need for potassium if not taking metolazone. Decrease torsemide from 80 mg to 40 mg daily (can take an extra 40 mg PRN for weight gain or worsening edema). ----- Message ----- From: Karie Soda, RN Sent: 08/27/2022   1:28 PM EDT To: Andrey Farmer, PA-C  Lillia Abed, Can you please review my note and provide recommendations until her OV with HF clinic on 5/29?  Pt has been mixing Furosemide and Torsemide along with weekly Metolazone this past month.  Thank you.

## 2022-08-27 NOTE — Progress Notes (Unsigned)
EPIC Encounter for ICM Monitoring  Patient Name: Tara Bradshaw is a 37 y.o. female Date: 08/27/2022 Primary Care Physican: Patient, No Pcp Per Primary Cardiologist: Bensimhon Electrophysiologist: Lalla Brothers 10/23/2021 Office Weight: 263 lbs   01/22/2022 Weight: 255 lbs 05/03/2022 Weight: 253 lbs 05/07/2022 Weight: 255 lbs 08/27/2022 Weight: 263 lbs (baseline 253 lbs)                  Spoke with patient.  She reports feeling very weak and washed out from taking Torsemide and Metolazone.  She feels like diuretic dosage is too high.  In the past month, she started taking Furosemide twice a week and Torsemide twice a week with Metolazone because daily Torsemide dosage was too high.     She held all diuretics since 5/16 after taking Torsemide/Metolazone on 5/15.       Corvue thoracic impedance suggesting possible dryness starting 5/16 (after taking 5/15 Torsemide and weekly Metolazone dosage) but returning toward baseline normal.       Prescribed: Torsemide 20 mg take 4 tablet(s) (80 mg total) by mouth once a day.  (Per patient, changed from Furosemide in January 2024 due to no longer effective) Potassium 20 mEq take 2 tablet(s) (40 mEq total) by mouth once a week on Wednesday Metolazone 2.5 mg take 1 tablet by mouth once a week on Wednesday.  Spironolactone 25 mg take 1 tablet by mouth daily   Labs: 08/24/2022 Creatinine 0.93, BUN 17, Potassium 3.5, Sodium 137, GFR >60 04/20/2022 Creatinine 0.68, BUN 13, Potassium 3.9, Sodium 135, GFR >60  04/16/2022 Creatinine 0.72, BUN 8,   Potassium 3.4, Sodium 136, GFR >60  04/11/2022 Creatinine 0.62, BUN 6,   Potassium 3.3, Sodium 137, GFR >60  A complete set of results can be found in Results Review.   Recommendations: Advised to stop taking Furosemide.  She should only be taking Torsemide and her prescription reads Torsemide 80 mg daily (she reports being instructed by Dr Gala Romney in January to take 1 tablet 4 times a day).   Copy sent to Anna Genre,  NP for recommendations regarding Potassium, Torsemide and metolazone dosage she should take until her 5/29 HF clinic OV.      Follow-up plan: ICM clinic phone appointment on 09/04/2022 to recheck fluid levels.   91 day device clinic remote transmission 08/31/2022.       EP/Cardiology Office Visits:   09/05/2022 with HF clinic.  Recall 10/18/2022 with Francis Dowse, PA.   Copy of ICM check sent to Dr. Lalla Brothers.  3 month ICM trend: 08/27/2022.    12-14 Month ICM trend:     Karie Soda, RN 08/27/2022 7:26 AM

## 2022-08-27 NOTE — Telephone Encounter (Signed)
I spoke to patient and had lengthy conversation about her medications. I explained how they each work and she felt better and understood better what was happening. She is only taking the Torsemide 40mg  daily as directed by Anna Genre. I will discontinue other meds on her list for now to avoid confusion.

## 2022-08-28 NOTE — Progress Notes (Signed)
Spoke with patient.  She confirmed she spoke with HF clinic 5/20 regarding which meds she should be taking.  She stated she is clear on the meds and has appt with HF clinic on 5/29.  Will recheck fluid levels on 5/28 and encouraged to call if condition changes.

## 2022-08-31 ENCOUNTER — Ambulatory Visit (INDEPENDENT_AMBULATORY_CARE_PROVIDER_SITE_OTHER): Payer: 59

## 2022-08-31 DIAGNOSIS — I428 Other cardiomyopathies: Secondary | ICD-10-CM | POA: Diagnosis not present

## 2022-08-31 LAB — CUP PACEART REMOTE DEVICE CHECK
Battery Remaining Longevity: 74 mo
Battery Remaining Percentage: 67 %
Battery Voltage: 2.98 V
Brady Statistic RV Percent Paced: 1 %
Date Time Interrogation Session: 20240523220636
HighPow Impedance: 60 Ohm
Implantable Lead Connection Status: 753985
Implantable Lead Implant Date: 20210107
Implantable Lead Location: 753860
Implantable Pulse Generator Implant Date: 20201112
Lead Channel Impedance Value: 310 Ohm
Lead Channel Pacing Threshold Amplitude: 1.75 V
Lead Channel Pacing Threshold Pulse Width: 0.5 ms
Lead Channel Sensing Intrinsic Amplitude: 11.4 mV
Lead Channel Setting Pacing Amplitude: 3.5 V
Lead Channel Setting Pacing Pulse Width: 0.5 ms
Lead Channel Setting Sensing Sensitivity: 0.5 mV
Pulse Gen Serial Number: 111012702
Zone Setting Status: 755011

## 2022-09-04 ENCOUNTER — Telehealth: Payer: Self-pay

## 2022-09-04 ENCOUNTER — Telehealth (HOSPITAL_COMMUNITY): Payer: Self-pay

## 2022-09-04 ENCOUNTER — Ambulatory Visit: Payer: 59 | Attending: Cardiology

## 2022-09-04 DIAGNOSIS — I5022 Chronic systolic (congestive) heart failure: Secondary | ICD-10-CM

## 2022-09-04 DIAGNOSIS — Z95810 Presence of automatic (implantable) cardiac defibrillator: Secondary | ICD-10-CM

## 2022-09-04 NOTE — Progress Notes (Signed)
EPIC Encounter for ICM Monitoring  Patient Name: Tara Bradshaw is a 36 y.o. female Date: 09/04/2022 Primary Care Physican: Patient, No Pcp Per Primary Cardiologist: Bensimhon Electrophysiologist: Lalla Brothers 10/23/2021 Office Weight: 263 lbs   01/22/2022 Weight: 255 lbs 05/03/2022 Weight: 253 lbs 05/07/2022 Weight: 255 lbs 08/27/2022 Weight: 263 lbs (baseline 253 lbs)                  Attempted call to patient and unable to reach.  Transmission reviewed.  Metolazone with potassium stopped 5/20 per HF clinic instructions due to patient feeling very weak after taking with Torsemide.     Corvue thoracic impedance changed from possible dryness (stopped metolazone) to suggesting possible fluid accumulation starting 5/20.       Prescribed: Torsemide 20 mg take 4 tablet(s) (80 mg total) by mouth once a day.   Spironolactone 25 mg take 1 tablet by mouth daily   Labs: 08/24/2022 Creatinine 0.93, BUN 17, Potassium 3.5, Sodium 137, GFR >60 04/20/2022 Creatinine 0.68, BUN 13, Potassium 3.9, Sodium 135, GFR >60  04/16/2022 Creatinine 0.72, BUN 8,   Potassium 3.4, Sodium 136, GFR >60  04/11/2022 Creatinine 0.62, BUN 6,   Potassium 3.3, Sodium 137, GFR >60  A complete set of results can be found in Results Review.   Recommendations:  Unable to reach.  Any recommendations will be given at 5/29 HF clinic OV.    Follow-up plan: ICM clinic phone appointment on 09/11/2022 to recheck fluid levels.   91 day device clinic remote transmission 11/30/2022.       EP/Cardiology Office Visits:   09/05/2022 with HF clinic.  Recall 10/18/2022 with Francis Dowse, PA.   Copy of ICM check sent to Dr. Lalla Brothers.   3 month ICM trend: 09/03/2022.    12-14 Month ICM trend:     Karie Soda, RN 09/04/2022 7:36 AM

## 2022-09-04 NOTE — Telephone Encounter (Signed)
Remote ICM transmission received.  Attempted call to patient regarding ICM remote transmission and no answer.  Voice mail box full. °

## 2022-09-04 NOTE — Telephone Encounter (Signed)
Called and was unable to leave patient a voice message  to confirm/remind patient of their appointment at the Advanced Heart Failure Clinic on 09/05/22.

## 2022-09-05 ENCOUNTER — Encounter (HOSPITAL_COMMUNITY): Payer: Self-pay

## 2022-09-05 ENCOUNTER — Ambulatory Visit (HOSPITAL_COMMUNITY)
Admission: RE | Admit: 2022-09-05 | Discharge: 2022-09-05 | Disposition: A | Discharge status: Home / Self Care | Payer: 59 | Source: Ambulatory Visit | Attending: Family Medicine | Admitting: Family Medicine

## 2022-09-05 VITALS — BP 128/80 | HR 83 | Wt 267.0 lb

## 2022-09-05 DIAGNOSIS — I471 Supraventricular tachycardia, unspecified: Secondary | ICD-10-CM | POA: Diagnosis not present

## 2022-09-05 DIAGNOSIS — Z79899 Other long term (current) drug therapy: Secondary | ICD-10-CM | POA: Insufficient documentation

## 2022-09-05 DIAGNOSIS — I428 Other cardiomyopathies: Secondary | ICD-10-CM | POA: Diagnosis not present

## 2022-09-05 DIAGNOSIS — I472 Ventricular tachycardia, unspecified: Secondary | ICD-10-CM | POA: Diagnosis not present

## 2022-09-05 DIAGNOSIS — I110 Hypertensive heart disease with heart failure: Secondary | ICD-10-CM | POA: Insufficient documentation

## 2022-09-05 DIAGNOSIS — I493 Ventricular premature depolarization: Secondary | ICD-10-CM | POA: Insufficient documentation

## 2022-09-05 DIAGNOSIS — K219 Gastro-esophageal reflux disease without esophagitis: Secondary | ICD-10-CM | POA: Diagnosis not present

## 2022-09-05 DIAGNOSIS — F17200 Nicotine dependence, unspecified, uncomplicated: Secondary | ICD-10-CM | POA: Diagnosis not present

## 2022-09-05 DIAGNOSIS — Z7984 Long term (current) use of oral hypoglycemic drugs: Secondary | ICD-10-CM | POA: Insufficient documentation

## 2022-09-05 DIAGNOSIS — I5082 Biventricular heart failure: Secondary | ICD-10-CM | POA: Insufficient documentation

## 2022-09-05 DIAGNOSIS — G4730 Sleep apnea, unspecified: Secondary | ICD-10-CM | POA: Diagnosis not present

## 2022-09-05 DIAGNOSIS — R5383 Other fatigue: Secondary | ICD-10-CM | POA: Diagnosis not present

## 2022-09-05 DIAGNOSIS — Z95810 Presence of automatic (implantable) cardiac defibrillator: Secondary | ICD-10-CM | POA: Diagnosis not present

## 2022-09-05 DIAGNOSIS — I5022 Chronic systolic (congestive) heart failure: Secondary | ICD-10-CM | POA: Diagnosis not present

## 2022-09-05 LAB — BASIC METABOLIC PANEL
Anion gap: 7 (ref 5–15)
BUN: 5 mg/dL — ABNORMAL LOW (ref 6–20)
CO2: 25 mmol/L (ref 22–32)
Calcium: 8.9 mg/dL (ref 8.9–10.3)
Chloride: 103 mmol/L (ref 98–111)
Creatinine, Ser: 0.71 mg/dL (ref 0.44–1.00)
GFR, Estimated: >60 mL/min (ref >60)
Glucose, Bld: 97 mg/dL (ref 70–99)
Potassium: 3.4 mmol/L — ABNORMAL LOW (ref 3.5–5.1)
Sodium: 135 mmol/L (ref 135–145)

## 2022-09-05 LAB — IRON AND TIBC
Iron: 43 ug/dL (ref 28–170)
Saturation Ratios: 13 % (ref 10.4–31.8)
TIBC: 342 ug/dL (ref 250–450)
UIBC: 299 ug/dL

## 2022-09-05 LAB — FERRITIN: Ferritin: 89 ng/mL (ref 11–307)

## 2022-09-05 LAB — BRAIN NATRIURETIC PEPTIDE: B Natriuretic Peptide: 652.1 pg/mL — ABNORMAL HIGH (ref 0.0–100.0)

## 2022-09-05 MED ORDER — POTASSIUM CHLORIDE CRYS ER 20 MEQ PO TBCR: 40.0000 meq | EXTENDED_RELEASE_TABLET | Freq: Two times a day (BID) | ORAL | 3 refills | Status: DC

## 2022-09-05 MED ORDER — METOLAZONE 2.5 MG PO TABS: 2.5000 mg | ORAL_TABLET | Freq: Every day | ORAL | 0 refills | Status: DC

## 2022-09-05 MED ORDER — TORSEMIDE 20 MG PO TABS: ORAL_TABLET | ORAL | 3 refills | Status: DC

## 2022-09-05 MED ORDER — METOPROLOL SUCCINATE ER 25 MG PO TB24: 12.5000 mg | ORAL_TABLET | Freq: Every day | ORAL | 3 refills | Status: DC

## 2022-09-05 MED ORDER — TORSEMIDE 20 MG PO TABS: 40.0000 mg | ORAL_TABLET | Freq: Two times a day (BID) | ORAL | 3 refills | Status: DC

## 2022-09-05 MED ORDER — POTASSIUM CHLORIDE CRYS ER 20 MEQ PO TBCR: 40.0000 meq | EXTENDED_RELEASE_TABLET | Freq: Every day | ORAL | 0 refills | Status: DC

## 2022-09-05 NOTE — Progress Notes (Addendum)
ADVANCED HEART FAILURE CLINIC NOTE   Primary Cardiologist: Dr. Gala Romney   HPI: Tara Bradshaw is a 37 y.o. female w/ nonischemic cardiomyopathy s/p SICD, HTN and systolic heart failure diagnosed in 06/26/21 w/ LVEF 20-25% , and s/p barostim 10/23.  At AHF f/u in 1/24 she was very SOB and fatigued with BLE edema. She could no longer walk more than 69ft without having to stop due to shortness of breath. In addition, sleeping on 4-5 pillows. During this time she has also noticed a 10-15lb weight gain. Patient was seen by cardiology her lasix dose was increased to 80mg  with the addition of metolazone. She had minimal improvement with this. Also with  satiety. RHC scheduled and diuretics adjusted.   RHC 1/24: RA 13, PA 49/33 (41), Fick CO/CI 6.3/3, Thermo CO/CI 6.4/3, PVR 2.3, PAPi 2.5. Biventricular heart failure with elevated filling pressures. Preserved CO.   Today she returns for AHF follow up with her boyfriend. This last month she has had issues with volume control. Diuretics have been adjusted multiple times. Not on Torsemide 20 mg BID. Overall feeling tired. Denies palpitations, CP, dizziness, edema, or PND/Orthopnea. SOB with walking from her bed to her bathroom. Still sleeping on 4-5 pillows. Appetite ok, eats out several times a week or her friend cooks some. No fever or chills. Weight at home 253 pounds. Taking all medications. Occasional ETOH use, smoking. Feels very weak after taking Torsemide.   Past Medical History:  Diagnosis Date   AICD (automatic cardioverter/defibrillator) present    Anemia    CHRONIC   CHF (congestive heart failure) (HCC)    Chronic systolic dysfunction of left ventricle    GERD (gastroesophageal reflux disease)    WITH PREGNANCY   Nonischemic cardiomyopathy (HCC)    Pneumonia    x 1   PVC's (premature ventricular contractions)    Sleep apnea     Current Outpatient Medications  Medication Sig Dispense Refill   acetaminophen (TYLENOL) 325 MG tablet  Take 650 mg by mouth every 6 (six) hours as needed for mild pain or headache.     cyclobenzaprine (FLEXERIL) 10 MG tablet Take 1 tablet (10 mg total) by mouth 3 (three) times daily as needed for muscle spasms. 10 tablet 0   dapagliflozin propanediol (FARXIGA) 10 MG TABS tablet Take 1 tablet (10 mg total) by mouth daily before breakfast. 90 tablet 3   gabapentin (NEURONTIN) 300 MG capsule Take 1 capsule (300 mg total) by mouth daily. (Patient taking differently: Take 300 mg by mouth at bedtime.) 30 capsule 1   levonorgestrel (MIRENA) 20 MCG/24HR IUD 1 each by Intrauterine route once.     meclizine (ANTIVERT) 25 MG tablet Take 1 tablet (25 mg total) by mouth 3 (three) times daily as needed for dizziness. 30 tablet 0   metolazone (ZAROXOLYN) 2.5 MG tablet Take 1 tablet (2.5 mg total) by mouth daily for 2 days. With 40 meq of potassium 2 tablet 0   Multiple Vitamins-Minerals (WOMENS MULTIVITAMIN PO) Take 1 tablet by mouth daily.     NON FORMULARY Pt uses a cpap nightly     sacubitril-valsartan (ENTRESTO) 97-103 MG Take 1 tablet by mouth 2 (two) times daily. 180 tablet 3   Semaglutide,0.25 or 0.5MG /DOS, 2 MG/3ML SOPN Inject 0.25mg  once a week for 4 weeks then increase to 0.5mg  once a week 3 mL 1   spironolactone (ALDACTONE) 25 MG tablet Take 1 tablet (25 mg total) by mouth daily. 90 tablet 3   amLODipine (NORVASC) 10 MG  tablet Take 1 tablet (10 mg total) by mouth daily. (Patient not taking: Reported on 09/05/2022) 90 tablet 3   metoprolol succinate (TOPROL-XL) 25 MG 24 hr tablet Take 0.5 tablets (12.5 mg total) by mouth at bedtime. 45 tablet 3   potassium chloride SA (KLOR-CON M) 20 MEQ tablet Take 2 tablets (40 mEq total) by mouth 2 (two) times daily. 120 tablet 3   torsemide (DEMADEX) 20 MG tablet Take 2 tablets (40 mg total) by mouth 2 (two) times daily. 120 tablet 3   No current facility-administered medications for this encounter.    No Known Allergies    Social History   Socioeconomic  History   Marital status: Widowed    Spouse name: Not on file   Number of children: Not on file   Years of education:  14   Highest education level: Not on file  Occupational History   Occupation: SECURITY OFFICER    Employer: [employer]  Tobacco Use   Smoking status: Never    Passive exposure: Never   Smokeless tobacco: Never  Vaping Use   Vaping Use: Never used  Substance and Sexual Activity   Alcohol use: No   Drug use: No   Sexual activity: Yes    Partners: Male    Birth control/protection: None  Other Topics Concern   Not on file  Social History Narrative   Lives in [place] with husband and children (6,1)   Unemployed    Previously worked in Office manager.   Social Determinants of Health   Financial Resource Strain: Low Risk  (12/05/2017)   Overall Financial Resource Strain (CARDIA)    Difficulty of Paying Living Expenses: Not hard at all  Food Insecurity: No Food Insecurity (12/05/2017)   Hunger Vital Sign    Worried About Running Out of Food in the Last Year: Never true    Ran Out of Food in the Last Year: Never true  Transportation Needs: No Transportation Needs (12/05/2017)   PRAPARE - Administrator, Civil Service (Medical): No    Lack of Transportation (Non-Medical): No  Physical Activity: Inactive (12/05/2017)   Exercise Vital Sign    Days of Exercise per Week: 0 days    Minutes of Exercise per Session: 0 min  Stress: Not on file  Social Connections: Not on file  Intimate Partner Violence: Not At Risk (12/05/2017)   Humiliation, Afraid, Rape, and Kick questionnaire    Fear of Current or Ex-Partner: No    Emotionally Abused: No    Physically Abused: No    Sexually Abused: No   Family History  Problem Relation Age of Onset   Epilepsy Mother    Asthma Mother    Cancer Paternal Grandmother        BREAST   Cancer Maternal Aunt 26       BREAST   Hypertension Maternal Grandmother    PHYSICAL EXAM: Vitals:   09/05/22 1628   BP: 128/80  Pulse: 83  SpO2: 100%   Physical exam:  General:  well appearing.  No conversational dyspnea. Walked into clinic HEENT: normal Neck: supple. JVD difficult to see but appears elevated . Carotids 2+ bilat; no bruits. No lymphadenopathy or thyromegaly appreciated. Cor: PMI nondisplaced. Regular rate & rhythm. No rubs, gallops or murmurs. Lungs: clear Abdomen: obese, soft, nontender, nondistended. No hepatosplenomegaly. No bruits or masses. Good bowel sounds. Extremities: no cyanosis, clubbing, rash, +1 BLE edema  Neuro: alert & oriented x 3, cranial nerves grossly intact. moves  all 4 extremities w/o difficulty. Affect pleasant.   Wt Readings from Last 3 Encounters:  09/05/22 121.1 kg (267 lb)  04/24/22 109.8 kg (242 lb)  04/20/22 115.2 kg (254 lb)    DATA REVIEW ECG: NSR with barostim artifact 83 bpm (Personally reviewed)    RHC 1/24: RA 13, PA 49/33 (41), Fick CO/CI 6.3/3, Thermo CO/CI 6.4/3, PVR 2.3, PAPi 2.5. Biventricular heart failure with elevated filling pressures. Preserved CO.  Zio 2/24 NSR avg HR 82 bpm, 45 runs NSVT (longest 8 beats), 2 runs SVT (5 beats max), 12.5% PVCs ECHO: 10/09/21: LVEF 25%-30%, severely dilated LV; RV mildly enlarged.  03/17/20: LVEF 25%-30%; normal RV function.  CPX 03/17/20:  Submax test. No evidence of significant cardiopulmonary limitation despite LV dysfunction. When corrected to ideal body weight, measured pVO2 is normal. Suspect majority of limitation related to obesity.   ReDS 47%   Device interrogation: volume elevated. No VT/VF  ASSESSMENT & PLAN: Chronic HFrEF  At baseline, Tara Bradshaw is fairly active; over the past 2 weeks she has had a significant decline in functional status along with 4+ orthopnea, PND and 10-15lb weight gain. S/p barostim 10/23 - RHC 1/24: RA 13, PA 49/33 (41), Fick CO/CI 6.3/3, Thermo CO/CI 6.4/3, PVR 2.3, PAPi 2.5. Biventricular heart failure with elevated filling pressures. Preserved CO.  - Zio 2/24  NSR avg HR 82 bpm, 45 runs NSVT (longest 8 beats), 2 runs SVT (5 beats max), 12.5% PVCs - NYHA III, Volume overloaded on exam. ReDS 47%. Weight up.  - Increase Torsemide from 20>40 BID and increase daily KDUR 20>40 BID - Will have her take metolazone 2.5 mg for 2 days with an extra 40 mEq KDUR - Continue Entresto 97/103 mg BID - Continue spiro 25 mg daily - Decrease metoprolol 25>12.5 mg QHS with constant fatigue - BMET/BNP today, repeat in 1 week - Has [facility] with [facility]. Discussed Cardiomems today as suggested during RHC, interested. Patient watched educational video while in clinic. Will start insurance approval process. Will double check with Dr. Gala Romney first with [facility].  2. Fatigue - has been persistent - will check anemia panel today  Follow up in 2 weeks with APP to reassess fluid status.   Brynda Peon, AGACNP-BC  09/05/22 4:33 PM  Advanced Heart Failure Clinic [facility] [address] [facility] [place] [address] [phone] (office) [phone] (fax)

## 2022-09-05 NOTE — Progress Notes (Signed)
ReDS Vest / Clip - 09/05/22 1500       ReDS Vest / Clip   Station Marker B    Ruler Value 32    ReDS Value Range High volume overload    ReDS Actual Value 47

## 2022-09-05 NOTE — Patient Instructions (Addendum)
Labs done today. We will contact you only if your labs are abnormal.  DECREASE Metoprolol 12.5mg  (1/2 tablet) daily at bedtime  INCREASE Torsemide to 40mg  (2 tablets) by mouth 2 times daily.   INCREASE Potassium to (2 tablets) by mouth 2 times daily.   TAKE METOLAZONE 2.5MG  (1 tablet) by mouth TODAY AND TOMORROW WITH AN EXTRA 2 TABLETS OF POTASSIUM.   No other medication changes were made. Please continue all current medications as prescribed.  Your physician recommends that you schedule a follow-up appointment in: 1 week for a lab only appointment and in 2 weeks with our NP/PA Clinic here in our office.   If you have any questions or concerns before your next appointment please send [place] a message through [place] or call our office at [phone].    TO LEAVE A MESSAGE FOR THE NURSE SELECT OPTION 2, PLEASE LEAVE A MESSAGE INCLUDING: YOUR NAME DATE OF BIRTH CALL BACK NUMBER REASON FOR CALL**this is important as we prioritize the call backs  YOU WILL RECEIVE A CALL BACK THE SAME DAY AS LONG AS YOU CALL BEFORE 4:00 PM   Do the following things EVERYDAY: Weigh yourself in the morning before breakfast. Write it down and keep it in a log. Take your medicines as prescribed Eat low salt foods--Limit salt (sodium) to 2000 mg per day.  Stay as active as you can everyday Limit all fluids for the day to less than 2 liters   At the [facility], you and your health needs are our priority. As part of our continuing mission to provide you with exceptional heart care, we have created designated Provider Care Teams. These Care Teams include your primary Cardiologist (physician) and Advanced Practice Providers (APPs- Physician Assistants and Nurse Practitioners) who all work together to provide you with the care you need, when you need it.   You may see any of the following providers on your designated Care Team at your next follow up: Dr Arvilla Meres Dr Marca Ancona Dr. Marcos Eke, NP Robbie Lis, [place] [facility] [place], [place] Brynda Peon, NP Karle Plumber, PharmD   Please be sure to bring in all your medications bottles to every appointment.    Thank you for choosing [facility]

## 2022-09-06 ENCOUNTER — Other Ambulatory Visit (HOSPITAL_COMMUNITY): Payer: Self-pay

## 2022-09-06 DIAGNOSIS — I5022 Chronic systolic (congestive) heart failure: Secondary | ICD-10-CM

## 2022-09-11 ENCOUNTER — Ambulatory Visit: Payer: 59 | Attending: Cardiology

## 2022-09-11 DIAGNOSIS — I5022 Chronic systolic (congestive) heart failure: Secondary | ICD-10-CM

## 2022-09-11 DIAGNOSIS — Z95810 Presence of automatic (implantable) cardiac defibrillator: Secondary | ICD-10-CM

## 2022-09-11 NOTE — Progress Notes (Signed)
EPIC Encounter for ICM Monitoring  Patient Name: Tara Bradshaw is a 37 y.o. female Date: 09/11/2022 Primary Care Physican: Patient, No Pcp Per Primary Cardiologist: Bensimhon Electrophysiologist: Lalla Brothers 10/23/2021 Office Weight: 263 lbs   01/22/2022 Weight: 255 lbs 05/03/2022 Weight: 253 lbs 05/07/2022 Weight: 255 lbs 08/27/2022 Weight: 263 lbs (baseline 253 lbs)                  Transmission reviewed.     Corvue thoracic impedance suggesting fluid levels returned to normal.       Prescribed: Torsemide 20 mg take 4 tablet(s) (80 mg total) by mouth once a day.   Spironolactone 25 mg take 1 tablet by mouth daily   Labs: 09/12/2022 BMET scheduled 08/24/2022 Creatinine 0.93, BUN 17, Potassium 3.5, Sodium 137, GFR >60 04/20/2022 Creatinine 0.68, BUN 13, Potassium 3.9, Sodium 135, GFR >60  04/16/2022 Creatinine 0.72, BUN 8,   Potassium 3.4, Sodium 136, GFR >60  04/11/2022 Creatinine 0.62, BUN 6,   Potassium 3.3, Sodium 137, GFR >60  A complete set of results can be found in Results Review.   Recommendations:  No changes.    Follow-up plan: ICM clinic phone appointment on 10/01/2022.   91 day device clinic remote transmission 11/30/2022.       EP/Cardiology Office Visits:   09/24/2022 with HF clinic.  Recall 10/18/2022 with Francis Dowse, PA.   Copy of ICM check sent to Dr. Lalla Brothers.  3 month ICM trend: 09/10/2022.    12-14 Month ICM trend:     Karie Soda, RN 09/11/2022 10:57 AM

## 2022-09-12 ENCOUNTER — Ambulatory Visit (HOSPITAL_COMMUNITY)
Admission: RE | Admit: 2022-09-12 | Discharge: 2022-09-12 | Disposition: A | Discharge status: Home / Self Care | Payer: 59 | Source: Ambulatory Visit | Attending: Cardiology | Admitting: Cardiology

## 2022-09-12 DIAGNOSIS — I5022 Chronic systolic (congestive) heart failure: Secondary | ICD-10-CM | POA: Insufficient documentation

## 2022-09-12 DIAGNOSIS — Z79899 Other long term (current) drug therapy: Secondary | ICD-10-CM | POA: Insufficient documentation

## 2022-09-12 LAB — BASIC METABOLIC PANEL
Anion gap: 8 (ref 5–15)
BUN: 9 mg/dL (ref 6–20)
CO2: 20 mmol/L — ABNORMAL LOW (ref 22–32)
Calcium: 9.2 mg/dL (ref 8.9–10.3)
Chloride: 107 mmol/L (ref 98–111)
Creatinine, Ser: 0.66 mg/dL (ref 0.44–1.00)
GFR, Estimated: >60 mL/min (ref >60)
Glucose, Bld: 98 mg/dL (ref 70–99)
Potassium: 4.2 mmol/L (ref 3.5–5.1)
Sodium: 135 mmol/L (ref 135–145)

## 2022-09-18 NOTE — Progress Notes (Signed)
Remote ICD transmission.   

## 2022-09-19 ENCOUNTER — Ambulatory Visit (HOSPITAL_COMMUNITY)
Admission: RE | Admit: 2022-09-19 | Discharge: 2022-09-19 | Disposition: A | Discharge status: Home / Self Care | Payer: 59 | Source: Ambulatory Visit | Attending: Internal Medicine | Admitting: Internal Medicine

## 2022-09-19 DIAGNOSIS — I5022 Chronic systolic (congestive) heart failure: Secondary | ICD-10-CM | POA: Insufficient documentation

## 2022-09-19 MED ORDER — SODIUM CHLORIDE 0.9 % IV SOLN
510.0000 mg | Freq: Once | INTRAVENOUS | Status: AC
  Administered 2022-09-19: 510 mg via INTRAVENOUS
  Filled 2022-09-19: qty 510

## 2022-09-21 ENCOUNTER — Telehealth (HOSPITAL_COMMUNITY): Payer: Self-pay

## 2022-09-21 NOTE — Telephone Encounter (Signed)
Called to confirm/remind patient of their appointment at the [facility] on 09/24/22.   Patient reminded to bring all medications and/or complete list.  Confirmed patient has transportation. Gave directions, instructed to utilize valet parking.  Confirmed appointment prior to ending call.   

## 2022-09-24 ENCOUNTER — Ambulatory Visit (HOSPITAL_COMMUNITY)
Admission: RE | Admit: 2022-09-24 | Discharge: 2022-09-24 | Disposition: A | Discharge status: Home / Self Care | Payer: 59 | Source: Ambulatory Visit | Attending: Family Medicine | Admitting: Family Medicine

## 2022-09-24 ENCOUNTER — Encounter (HOSPITAL_COMMUNITY): Payer: Self-pay

## 2022-09-24 VITALS — BP 108/82 | HR 75 | Wt 259.0 lb

## 2022-09-24 DIAGNOSIS — I428 Other cardiomyopathies: Secondary | ICD-10-CM | POA: Insufficient documentation

## 2022-09-24 DIAGNOSIS — Z8249 Family history of ischemic heart disease and other diseases of the circulatory system: Secondary | ICD-10-CM | POA: Insufficient documentation

## 2022-09-24 DIAGNOSIS — J069 Acute upper respiratory infection, unspecified: Secondary | ICD-10-CM | POA: Insufficient documentation

## 2022-09-24 DIAGNOSIS — I472 Ventricular tachycardia, unspecified: Secondary | ICD-10-CM | POA: Diagnosis not present

## 2022-09-24 DIAGNOSIS — I110 Hypertensive heart disease with heart failure: Secondary | ICD-10-CM | POA: Diagnosis not present

## 2022-09-24 DIAGNOSIS — I5082 Biventricular heart failure: Secondary | ICD-10-CM | POA: Diagnosis not present

## 2022-09-24 DIAGNOSIS — I471 Supraventricular tachycardia, unspecified: Secondary | ICD-10-CM | POA: Insufficient documentation

## 2022-09-24 DIAGNOSIS — E669 Obesity, unspecified: Secondary | ICD-10-CM | POA: Insufficient documentation

## 2022-09-24 DIAGNOSIS — I5022 Chronic systolic (congestive) heart failure: Secondary | ICD-10-CM

## 2022-09-24 DIAGNOSIS — I493 Ventricular premature depolarization: Secondary | ICD-10-CM | POA: Diagnosis not present

## 2022-09-24 DIAGNOSIS — I10 Essential (primary) hypertension: Secondary | ICD-10-CM

## 2022-09-24 DIAGNOSIS — Z79899 Other long term (current) drug therapy: Secondary | ICD-10-CM | POA: Diagnosis not present

## 2022-09-24 DIAGNOSIS — G4733 Obstructive sleep apnea (adult) (pediatric): Secondary | ICD-10-CM

## 2022-09-24 DIAGNOSIS — Z6841 Body Mass Index (BMI) 40.0 and over, adult: Secondary | ICD-10-CM | POA: Insufficient documentation

## 2022-09-24 LAB — BASIC METABOLIC PANEL
Anion gap: 11 (ref 5–15)
BUN: 9 mg/dL (ref 6–20)
CO2: 23 mmol/L (ref 22–32)
Calcium: 9 mg/dL (ref 8.9–10.3)
Chloride: 104 mmol/L (ref 98–111)
Creatinine, Ser: 0.64 mg/dL (ref 0.44–1.00)
GFR, Estimated: >60 mL/min (ref >60)
Glucose, Bld: 91 mg/dL (ref 70–99)
Potassium: 3.8 mmol/L (ref 3.5–5.1)
Sodium: 138 mmol/L (ref 135–145)

## 2022-09-24 LAB — BRAIN NATRIURETIC PEPTIDE: B Natriuretic Peptide: 415.3 pg/mL — ABNORMAL HIGH (ref 0.0–100.0)

## 2022-09-24 NOTE — Patient Instructions (Addendum)
Thank you for coming in today  If you had labs drawn today, any labs that are abnormal the clinic will call you No news is good news  Medications: No changes  Follow up appointments:  PLEASE CALL TO GET SCHEDULED FOR EP FOLLOW UP 10/2022 [phone]  Your physician recommends that you schedule a follow-up appointment in:  3 months With Dr. Gala Romney   Your physician has requested that you have an echocardiogram. Echocardiography is a painless test that uses sound waves to create images of your heart. It provides your doctor with information about the size and shape of your heart and how well your heart's chambers and valves are working. This procedure takes approximately one hour. There are no restrictions for this procedure.     You are scheduled for a Cardiopulmonary Exercise (CPX) Test as [facility] on: Date: you will be called to be scheduled       Time:   Expect to be in the lab for 2 hours. Please plan to arrive 30 minutes prior to your appointment. You may be asked to reschedule your test if you arrive 20 minutes or more after your scheduled appointment time.  Main Campus address: [address] [place], [place] [address] You may arrive to the Main Entrance A or Entrance C (free valet parking is available at both). -Main Entrance A (on [address]) :proceed to admitting for check in -Entrance C (on [facility]): proceed to [employer] parking or under hospital deck parking using this code _________  Check In: [facility] waiting room (1st floor)   General Instructions for the day of the test (Please follow all instructions from your physician): Refrain from ingesting a heavy meal, alcohol, or caffeine or using tobacco products within 2 hours of the test (DO NOT FAST for mare than 8 hours). You may have all other non-alcoholic, non -caffeinated beverage,a light snack (crackers,a piece of fruit, carrot sticks, toast bagel,etc) up to your appointment. Avoid  significant exertion or exercise within 24 hours of your test. Be prepared to exercise and sweat. Your clothing should permit freedom of movement and include walking or running shoes. Women bring loose fitting short sleeved blouse.  This evaluation may be fatiguing and you may wish ti have someone accompany you to the assessment to drive you home afterward. Bring a list of your medications with you, including dosage and frequency you take the medications (  I.e.,once per day, twice per day, etc). Take all medications as prescribed, unless noted below or instructed to do so by your physician.  Please do not take the following medications prior to your CPX:  _________________________________________________  _________________________________________________  Brief description of the test: A brief lung test will be performed. This will involve you taking deep breaths and blowing hard and fast through your mouth. During these , a clip will be on your nose and you will be breathing through a breathing device.   For the exercise portion of the test you will be walking on a treadmill, or riding a stationary bike, to your maximal effor or until symptoms such as chest pain, shortness of breath, leg pain or dizziness limit your exercise. You will be breathing in and out of a breathing device through your mouth (a clip will be on your nose again). Your heart rate, ECG, blood pressure, oxygen saturations, breathing rate and depth, amount of oxygen you consume and amount of carbon dioxide you produce will be measured and monitored throughout the exercise  test.  If you need to cancel or reschedule your appointment please call [phone] If you have further questions please call your physician or Philip Aspen, MS, ACSM-RCEP at [phone]    Do the following things EVERYDAY: Weigh yourself in the morning before breakfast. Write it down and keep it in a log. Take your medicines as prescribed Eat low salt  foods--Limit salt (sodium) to 2000 mg per day.  Stay as active as you can everyday Limit all fluids for the day to less than 2 liters   At the [facility], you and your health needs are our priority. As part of our continuing mission to provide you with exceptional heart care, we have created designated Provider Care Teams. These Care Teams include your primary Cardiologist (physician) and Advanced Practice Providers (APPs- Physician Assistants and Nurse Practitioners) who all work together to provide you with the care you need, when you need it.   You may see any of the following providers on your designated Care Team at your next follow up: Dr Arvilla Meres Dr Marca Ancona Dr. Marcos Eke, NP Robbie Lis, [place] [facility] [place], [place] Brynda Peon, NP Karle Plumber, PharmD   Please be sure to bring in all your medications bottles to every appointment.    Thank you for choosing [facility]  If you have any questions or concerns before your next appointment please send [place] a message through [place] or call our office at [phone].    TO LEAVE A MESSAGE FOR THE NURSE SELECT OPTION 2, PLEASE LEAVE A MESSAGE INCLUDING: YOUR NAME DATE OF BIRTH CALL BACK NUMBER REASON FOR CALL**this is important as we prioritize the call backs  YOU WILL RECEIVE A CALL BACK THE SAME DAY AS LONG AS YOU CALL BEFORE 4:00 PM

## 2022-09-24 NOTE — Progress Notes (Signed)
[facility] Note  HF Cardiologist: Dr. Gala Romney   HPI: Tara Bradshaw is a 37 y.o. female w/ nonischemic cardiomyopathy s/p SICD, HTN and systolic heart failure diagnosed in 06/26/21 w/ LVEF 20-25% , and s/p barostim 10/23.  Admitted in 3/20 with acute systolic HF with EF 20-25% in setting of recent viral illness and 81-month post-partum status. COVID testing negative.   ZioPatch 8/20 NSR. Occasional PVCs (3.5%) 9-beat run NSVT    Had return visit w/ Dr. Gala Romney 01/15/19. Endorsed fatigue after starting carvedilol.    R/LHC on 01/22/19 which showed severe NICM EF 20%, Normal coronaries, Well compensated hemodynamics with high cardiac output.    CPX 12/21 FVC 2.68 (93%)      FEV1 2.27 (93%)        FEV1/FVC 85 (100%)        MVV 84 (84%)    Resting HR: 70 Standing HR: 70 Peak HR: 135   (73% age predicted max HR)   BP rest: 148/94 Standing BP: 126/92 BP peak: 166/84  Peak VO2: 14.9 (76% predicted peak VO2) When adjusted to the patient's ideal body weight of 127.7 lb (57.9 kg) the peak VO2 is 29.4 ml/kg (ibw)/min (91% of the ibw-adjusted predicted).  VE/VCO2 slope:  27  Peak RER: 0.95   Follow up 1/24, volume overloaded with worse NYHA III symptoms, despite 1 week of increased diuresis. Arranged for RHC which showed RA 13, PA 49/33 (41), Fick CO/CI 6.3/3, Thermo CO/CI 6.4/3, PVR 2.3, PAPi 2.5. Biventricular heart failure with elevated filling pressures. Preserved CO.   Zio (2/24) showed mostly NSR with 12.5% PVC burden, 45 runs of NSVT  Follow up 5/24, volume up and weight up, REDs 47%. Instructed to take metolazone 2.5/40 KCL x 2 days and increase torsemide to 40 bid. Anemia panel showed iron deficient and arranged for iron infusion.  Today she returns for HF follow up with her 2 children. Overall feeling fair. She has a URI. She has SOB walking on flat ground, feels about the same. Feels frequent palpitations, dizziness off and on, especially with torsemide +  metolazone. She continues to have ankle swelling. Denies CP, abnormal bleeding or PND/Orthopnea. Appetite ok. No fever or chills. Weight at home 258 pounds. Taking all medications. Chronically sleeps on 4 pillows. She wears CPAP at night. Received iron infusion but did not feel improvement in fatigue. Just recently started back on Ozempic.  Cardiac Studies  - Zio (2/24): NSR avg HR 82 bpm, 45 runs NSVT (longest 8 beats), 2 runs SVT (5 beats max), 12.5% PVCs  - RHC (1/24): RA 13, PA 49/33 (41), PCWP 26, CO/CI (Fick): 6.3/3, PVR 2.3, PAPi 2.5.   - Echo (7/23): EF 25%-30%, severely dilated LV; RV mildly enlarged.   - CPX (03/17/20):  Submax test. No evidence of significant cardiopulmonary limitation despite LV dysfunction. When corrected to ideal body weight, measured pVO2 is normal. Suspect majority of limitation related to obesity.   - Echo (12/21): EF 25-30%  - Echo (10/20): EF 25-30%, (read as 30-35%)  Past Medical History:  Diagnosis Date   AICD (automatic cardioverter/defibrillator) present    Anemia    CHRONIC   CHF (congestive heart failure) (HCC)    Chronic systolic dysfunction of left ventricle    GERD (gastroesophageal reflux disease)    WITH PREGNANCY   Nonischemic cardiomyopathy (HCC)    Pneumonia    x 1   PVC's (premature ventricular contractions)    Sleep apnea    Current Outpatient  Medications  Medication Sig Dispense Refill   acetaminophen (TYLENOL) 325 MG tablet Take 650 mg by mouth every 6 (six) hours as needed for mild pain or headache.     cyclobenzaprine (FLEXERIL) 10 MG tablet Take 1 tablet (10 mg total) by mouth 3 (three) times daily as needed for muscle spasms. 10 tablet 0   dapagliflozin propanediol (FARXIGA) 10 MG TABS tablet Take 1 tablet (10 mg total) by mouth daily before breakfast. 90 tablet 3   gabapentin (NEURONTIN) 300 MG capsule Take 1 capsule (300 mg total) by mouth daily. (Patient taking differently: Take 300 mg by mouth at bedtime.) 30 capsule 1    levonorgestrel (MIRENA) 20 MCG/24HR IUD 1 each by Intrauterine route once.     meclizine (ANTIVERT) 25 MG tablet Take 1 tablet (25 mg total) by mouth 3 (three) times daily as needed for dizziness. 30 tablet 0   metolazone (ZAROXOLYN) 2.5 MG tablet Take 1 tablet (2.5 mg total) by mouth daily for 2 days. With 40 meq of potassium 2 tablet 0   metoprolol succinate (TOPROL-XL) 25 MG 24 hr tablet Take 0.5 tablets (12.5 mg total) by mouth at bedtime. 45 tablet 3   Multiple Vitamins-Minerals (WOMENS MULTIVITAMIN PO) Take 1 tablet by mouth daily.     NON FORMULARY Pt uses a cpap nightly     potassium chloride SA (KLOR-CON M) 20 MEQ tablet Take 2 tablets (40 mEq total) by mouth 2 (two) times daily. 120 tablet 3   sacubitril-valsartan (ENTRESTO) 97-103 MG Take 1 tablet by mouth 2 (two) times daily. 180 tablet 3   Semaglutide,0.25 or 0.5MG /DOS, 2 MG/3ML SOPN Inject 0.25mg  once a week for 4 weeks then increase to 0.5mg  once a week 3 mL 1   spironolactone (ALDACTONE) 25 MG tablet Take 1 tablet (25 mg total) by mouth daily. 90 tablet 3   torsemide (DEMADEX) 20 MG tablet Take 2 tablets (40 mg total) by mouth 2 (two) times daily. 120 tablet 3   No current facility-administered medications for this encounter.   No Known Allergies  Social History   Socioeconomic History   Marital status: Widowed    Spouse name: Not on file   Number of children: Not on file   Years of education:  14   Highest education level: Not on file  Occupational History   Occupation: SECURITY OFFICER    Employer: [employer]  Tobacco Use   Smoking status: Never    Passive exposure: Never   Smokeless tobacco: Never  Vaping Use   Vaping Use: Never used  Substance and Sexual Activity   Alcohol use: No   Drug use: No   Sexual activity: Yes    Partners: Male    Birth control/protection: None  Other Topics Concern   Not on file  Social History Narrative   Lives in [place] with husband and children (6,1)    Unemployed    Previously worked in Office manager.   Social Determinants of Health   Financial Resource Strain: Low Risk  (12/05/2017)   Overall Financial Resource Strain (CARDIA)    Difficulty of Paying Living Expenses: Not hard at all  Food Insecurity: No Food Insecurity (12/05/2017)   Hunger Vital Sign    Worried About Running Out of Food in the Last Year: Never true    Ran Out of Food in the Last Year: Never true  Transportation Needs: No Transportation Needs (12/05/2017)   PRAPARE - Administrator, Civil Service (Medical): No  Lack of Transportation (Non-Medical): No  Physical Activity: Inactive (12/05/2017)   Exercise Vital Sign    Days of Exercise per Week: 0 days    Minutes of Exercise per Session: 0 min  Stress: Not on file  Social Connections: Not on file  Intimate Partner Violence: Not At Risk (12/05/2017)   Humiliation, Afraid, Rape, and Kick questionnaire    Fear of Current or Ex-Partner: No    Emotionally Abused: No    Physically Abused: No    Sexually Abused: No   Family History  Problem Relation Age of Onset   Epilepsy Mother    Asthma Mother    Cancer Paternal Grandmother        BREAST   Cancer Maternal Aunt 40       BREAST   Hypertension Maternal Grandmother    BP 108/82   Pulse 75   Wt 117.5 kg (259 lb)   SpO2 100%   BMI 47.37 kg/m   Wt Readings from Last 3 Encounters:  09/24/22 117.5 kg (259 lb)  09/05/22 121.1 kg (267 lb)  04/24/22 109.8 kg (242 lb)   Physical exam:  General:  NAD. No resp difficulty, walked into clinic HEENT: Normal Neck: Supple. No JVD, thick neck. Carotids 2+ bilat; no bruits. No lymphadenopathy or thryomegaly appreciated. Cor: PMI nondisplaced. Regular rate & rhythm. No rubs, gallops or murmurs. Lungs: Clear Abdomen: Soft, obese, nontender, nondistended. No hepatosplenomegaly. No bruits or masses. Good bowel sounds. Extremities: No cyanosis, clubbing, rash, edema Neuro: Alert & oriented x 3, cranial nerves  grossly intact. Moves all 4 extremities w/o difficulty. Affect pleasant.  Device interrogation (personally reviewed): < 1% V paced, no VT, CorVue stable  ASSESSMENT & PLAN: Chronic HFrEF:  - Diagnosed 3/20. Echo LVEF 25% with moderate RV dysfunction - NICM. Either viral (had URI in week prior to admission and respiratory panel + rhinovirus) or post-partum or PVC cardiomyopathy.  - LHC 10/20 showed normal cors. RHC showed well compensated hemodynamics with high cardiac output. No evidence of intracardiac shunting.  - Echo 10/20 EF 25-30% - Echo 12/21 EF 25-30% - Echo 10/09/21 EF 25-30% Personally reviewed - CPX 1/22 Peak VO2: 14.9 (76% predicted peak VO2) adjusted to ibw pVO2 is 29.4 ml/kg (ibw)/min (91% of the ibw-adjusted predicted). VE/VCO2 slope:  27 - S/p barostim 10/23 - RHC (1/24): RA 13, PA 49/33 (41), Fick CO/CI 6.3/3, Thermo CO/CI 6.4/3, PVR 2.3, PAPi 2.5. Biventricular heart failure with elevated filling pressures. Preserved CO.  - Zio 2/24 NSR avg HR 82 bpm, 45 runs NSVT (longest 8 beats), 2 runs SVT (5 beats max), 12.5% PVCs - NYHA III, suspect symptoms not all HF. Volume looks ok today on exam and by CorVue - Continue Farxiga 10 mg daily. - Continue torsemide 40 mg bid + 40 KCL bid - Continue Entresto 97/103 mg bid - Continue spiro 25 mg daily - Continue Toprol XL 12.5 mg at bedtime - Labs today. - Update echo - With worsening symptoms, repeat CPX to quantify HF limitation.   2. HTN - BP ok - Continue current GDMT  3. PVCs:  - Zio (2/24) showed 12.5 % PVC burden - Amio stopped 7/23 as PVC suppression did not improve EF and favor not exposing her to long-term side effects - Update echo  4. OSA - Moderate on sleep study (4/20) - Compliant with CPAP  5. Obesity - Body mass index is 47.37 kg/m. - Recently started back on GLP1  Follow up in 3 months with Dr. Gala Romney +  echo  [place], FNP-BC 09/24/22 11:57 AM   [facility]  [facility] [address] [facility] [place] [address] [phone] (office) [phone] (fax)

## 2022-09-26 ENCOUNTER — Telehealth (HOSPITAL_COMMUNITY): Payer: Self-pay

## 2022-09-26 DIAGNOSIS — I5022 Chronic systolic (congestive) heart failure: Secondary | ICD-10-CM

## 2022-09-26 MED ORDER — TORSEMIDE 20 MG PO TABS: 60.0000 mg | ORAL_TABLET | Freq: Two times a day (BID) | ORAL | 3 refills | Status: DC

## 2022-09-26 MED ORDER — POTASSIUM CHLORIDE CRYS ER 20 MEQ PO TBCR: 60.0000 meq | EXTENDED_RELEASE_TABLET | Freq: Two times a day (BID) | ORAL | 3 refills | Status: DC

## 2022-09-26 NOTE — Telephone Encounter (Addendum)
Labs scheduled   Rx. Updated    ----- Message from Jacklynn Ganong, FNP sent at 09/24/2022  4:29 PM EDT ----- Labs stable, BNP improving but still elevated  Increase torsemide to 60 mg bid, increase KCL to 60 bid.  Will try to avoid metolazone use as it makes her feel poorly.  Repeat BMET in 10-14 days

## 2022-10-01 ENCOUNTER — Ambulatory Visit: Payer: 59 | Attending: Cardiology

## 2022-10-01 DIAGNOSIS — Z95810 Presence of automatic (implantable) cardiac defibrillator: Secondary | ICD-10-CM | POA: Diagnosis not present

## 2022-10-01 DIAGNOSIS — I5022 Chronic systolic (congestive) heart failure: Secondary | ICD-10-CM | POA: Diagnosis not present

## 2022-10-03 ENCOUNTER — Telehealth: Payer: Self-pay

## 2022-10-03 NOTE — Telephone Encounter (Signed)
Remote ICM transmission received.  Attempted call to patient regarding ICM remote transmission and mail box is full. 

## 2022-10-03 NOTE — Progress Notes (Signed)
EPIC Encounter for ICM Monitoring  Patient Name: Tara Bradshaw is a 37 y.o. female Date: 10/03/2022 Primary Care Physican: Patient, No Pcp Per Primary Cardiologist: Bensimhon Electrophysiologist: Lalla Brothers 10/23/2021 Office Weight: 263 lbs   01/22/2022 Weight: 255 lbs 05/03/2022 Weight: 253 lbs 05/07/2022 Weight: 255 lbs 08/27/2022 Weight: 263 lbs (baseline 253 lbs)                  Attempted call to patient and unable to reach.   Transmission reviewed.      Corvue thoracic impedance suggesting normal fluid levels.       Prescribed: Torsemide 20 mg take 3 tablet(s) (60 mg total) by mouth twice a day.   Potassium 20 mEq take 3 tablet(s) (60 mg total) by mouth twice a day. Spironolactone 25 mg take 1 tablet by mouth daily   Labs: 10/09/2022 BMET scheduled 09/24/2022 Creatinine 0.64, BUN   9, Potassium 3.8, Sodium 138, GFR >60 09/12/2022 Creatinine 0.71, BUN   5, Potassium 3.4, Sodium 135, GFR >60 09/05/2022 Creatinine 0.93, BUN 17, Potassium 3.5, Sodium 137, GFR >60 08/24/2022 Creatinine 0.93, BUN 17, Potassium 3.5, Sodium 137, GFR >60 04/20/2022 Creatinine 0.68, BUN 13, Potassium 3.9, Sodium 135, GFR >60  04/16/2022 Creatinine 0.72, BUN 8,   Potassium 3.4, Sodium 136, GFR >60  04/11/2022 Creatinine 0.62, BUN 6,   Potassium 3.3, Sodium 137, GFR >60  A complete set of results can be found in Results Review.   Recommendations:  Unable to reach.     Follow-up plan: ICM clinic phone appointment on 11/05/2022.   91 day device clinic remote transmission 11/30/2022.       EP/Cardiology Office Visits:   11/30/2022 with HF clinic.  Recall 10/18/2022 with Francis Dowse, PA.   Copy of ICM check sent to Dr. Lalla Brothers.   3 month ICM trend: 09/30/2022.    12-14 Month ICM trend:     Karie Soda, RN 10/03/2022 9:55 AM

## 2022-10-09 ENCOUNTER — Encounter: Payer: Self-pay | Admitting: Internal Medicine

## 2022-10-09 ENCOUNTER — Ambulatory Visit (HOSPITAL_COMMUNITY)
Admission: RE | Admit: 2022-10-09 | Discharge: 2022-10-09 | Disposition: A | Discharge status: Home / Self Care | Payer: 59 | Source: Ambulatory Visit | Attending: Cardiology | Admitting: Cardiology

## 2022-10-09 DIAGNOSIS — I5022 Chronic systolic (congestive) heart failure: Secondary | ICD-10-CM | POA: Diagnosis not present

## 2022-10-09 LAB — BASIC METABOLIC PANEL
Anion gap: 8 (ref 5–15)
BUN: <5 mg/dL — ABNORMAL LOW (ref 6–20)
CO2: 22 mmol/L (ref 22–32)
Calcium: 9 mg/dL (ref 8.9–10.3)
Chloride: 107 mmol/L (ref 98–111)
Creatinine, Ser: 0.62 mg/dL (ref 0.44–1.00)
GFR, Estimated: >60 mL/min (ref >60)
Glucose, Bld: 94 mg/dL (ref 70–99)
Potassium: 3.7 mmol/L (ref 3.5–5.1)
Sodium: 137 mmol/L (ref 135–145)

## 2022-11-05 ENCOUNTER — Ambulatory Visit: Payer: 59 | Attending: Cardiology

## 2022-11-05 DIAGNOSIS — I5022 Chronic systolic (congestive) heart failure: Secondary | ICD-10-CM | POA: Diagnosis not present

## 2022-11-05 DIAGNOSIS — Z95810 Presence of automatic (implantable) cardiac defibrillator: Secondary | ICD-10-CM

## 2022-11-07 ENCOUNTER — Telehealth: Payer: Self-pay

## 2022-11-07 NOTE — Telephone Encounter (Signed)
Remote ICM transmission received.  Attempted call to patient regarding ICM remote transmission and no answer.  

## 2022-11-07 NOTE — Progress Notes (Signed)
EPIC Encounter for ICM Monitoring  Patient Name: Tara Bradshaw is a 37 y.o. female Date: 11/07/2022 Primary Care Physican: Patient, No Pcp Per Primary Cardiologist: Bensimhon Electrophysiologist: Lalla Brothers 10/23/2021 Office Weight: 263 lbs   01/22/2022 Weight: 255 lbs 05/03/2022 Weight: 253 lbs 05/07/2022 Weight: 255 lbs 08/27/2022 Weight: 263 lbs (baseline 253 lbs)                  Attempted call to patient and unable to reach.   Transmission reviewed.      Corvue thoracic impedance suggesting normal fluid levels with the exception of possible dryness on 7/28.       Prescribed: Torsemide 20 mg take 3 tablet(s) (60 mg total) by mouth twice a day.   Potassium 20 mEq take 3 tablet(s) (60 mg total) by mouth twice a day. Spironolactone 25 mg take 1 tablet by mouth daily   Labs: 10/09/2022 Creatinine 0.62, BUN <5, Potassium 3.7, Sodium 137, GFR >60 09/24/2022 Creatinine 0.64, BUN   9, Potassium 3.8, Sodium 138, GFR >60 09/12/2022 Creatinine 0.71, BUN   5, Potassium 3.4, Sodium 135, GFR >60 09/05/2022 Creatinine 0.93, BUN 17, Potassium 3.5, Sodium 137, GFR >60 08/24/2022 Creatinine 0.93, BUN 17, Potassium 3.5, Sodium 137, GFR >60 04/20/2022 Creatinine 0.68, BUN 13, Potassium 3.9, Sodium 135, GFR >60  04/16/2022 Creatinine 0.72, BUN 8,   Potassium 3.4, Sodium 136, GFR >60  04/11/2022 Creatinine 0.62, BUN 6,   Potassium 3.3, Sodium 137, GFR >60  A complete set of results can be found in Results Review.   Recommendations:  Unable to reach.     Follow-up plan: ICM clinic phone appointment on 12/11/2022.   91 day device clinic remote transmission 11/30/2022.       EP/Cardiology Office Visits:   Recall 12/23/2022 with Dr Gala Romney.  Recall 10/18/2022 with Francis Dowse, PA.   Copy of ICM check sent to Dr. Lalla Brothers.    3 month ICM trend: 11/04/2022.    12-14 Month ICM trend:     Karie Soda, RN 11/07/2022 1:21 PM

## 2022-11-10 ENCOUNTER — Emergency Department (HOSPITAL_COMMUNITY): Payer: 59

## 2022-11-10 ENCOUNTER — Encounter (HOSPITAL_COMMUNITY): Payer: Self-pay | Admitting: *Deleted

## 2022-11-10 ENCOUNTER — Observation Stay (HOSPITAL_COMMUNITY)
Admission: EM | Admit: 2022-11-10 | Discharge: 2022-11-11 | Disposition: A | Discharge status: Home / Self Care | Payer: 59 | Attending: Internal Medicine | Admitting: Internal Medicine

## 2022-11-10 ENCOUNTER — Other Ambulatory Visit: Payer: Self-pay

## 2022-11-10 DIAGNOSIS — J189 Pneumonia, unspecified organism: Secondary | ICD-10-CM | POA: Diagnosis not present

## 2022-11-10 DIAGNOSIS — G4730 Sleep apnea, unspecified: Secondary | ICD-10-CM | POA: Diagnosis not present

## 2022-11-10 DIAGNOSIS — R918 Other nonspecific abnormal finding of lung field: Secondary | ICD-10-CM | POA: Diagnosis not present

## 2022-11-10 DIAGNOSIS — I509 Heart failure, unspecified: Secondary | ICD-10-CM | POA: Insufficient documentation

## 2022-11-10 DIAGNOSIS — R079 Chest pain, unspecified: Secondary | ICD-10-CM | POA: Diagnosis not present

## 2022-11-10 DIAGNOSIS — Z95810 Presence of automatic (implantable) cardiac defibrillator: Secondary | ICD-10-CM | POA: Diagnosis not present

## 2022-11-10 DIAGNOSIS — R0789 Other chest pain: Secondary | ICD-10-CM | POA: Diagnosis not present

## 2022-11-10 DIAGNOSIS — I429 Cardiomyopathy, unspecified: Secondary | ICD-10-CM

## 2022-11-10 DIAGNOSIS — J188 Other pneumonia, unspecified organism: Secondary | ICD-10-CM | POA: Diagnosis present

## 2022-11-10 DIAGNOSIS — Z79899 Other long term (current) drug therapy: Secondary | ICD-10-CM | POA: Diagnosis not present

## 2022-11-10 DIAGNOSIS — K219 Gastro-esophageal reflux disease without esophagitis: Secondary | ICD-10-CM | POA: Diagnosis present

## 2022-11-10 DIAGNOSIS — I517 Cardiomegaly: Secondary | ICD-10-CM | POA: Diagnosis not present

## 2022-11-10 DIAGNOSIS — R2689 Other abnormalities of gait and mobility: Secondary | ICD-10-CM | POA: Diagnosis not present

## 2022-11-10 DIAGNOSIS — R0602 Shortness of breath: Secondary | ICD-10-CM | POA: Diagnosis not present

## 2022-11-10 DIAGNOSIS — I428 Other cardiomyopathies: Secondary | ICD-10-CM

## 2022-11-10 LAB — HIV ANTIBODY (ROUTINE TESTING W REFLEX): HIV Screen 4th Generation wRfx: NONREACTIVE

## 2022-11-10 LAB — BASIC METABOLIC PANEL
Anion gap: 9 (ref 5–15)
BUN: 11 mg/dL (ref 6–20)
CO2: 22 mmol/L (ref 22–32)
Calcium: 9.6 mg/dL (ref 8.9–10.3)
Chloride: 106 mmol/L (ref 98–111)
Creatinine, Ser: 0.73 mg/dL (ref 0.44–1.00)
GFR, Estimated: >60 mL/min (ref >60)
Glucose, Bld: 110 mg/dL — ABNORMAL HIGH (ref 70–99)
Potassium: 3.7 mmol/L (ref 3.5–5.1)
Sodium: 137 mmol/L (ref 135–145)

## 2022-11-10 LAB — CBC
HCT: 36.4 % (ref 36.0–46.0)
Hemoglobin: 11.7 g/dL — ABNORMAL LOW (ref 12.0–15.0)
MCH: 26.7 pg (ref 26.0–34.0)
MCHC: 32.1 g/dL (ref 30.0–36.0)
MCV: 83.1 fL (ref 80.0–100.0)
Platelets: 215 10*3/uL (ref 150–400)
RBC: 4.38 MIL/uL (ref 3.87–5.11)
RDW: 14.6 % (ref 11.5–15.5)
WBC: 15.3 10*3/uL — ABNORMAL HIGH (ref 4.0–10.5)
nRBC: 0 % (ref 0.0–0.2)

## 2022-11-10 LAB — LIPASE, BLOOD: Lipase: 30 U/L (ref 11–51)

## 2022-11-10 LAB — HCG, SERUM, QUALITATIVE: Preg, Serum: NEGATIVE

## 2022-11-10 LAB — TROPONIN I (HIGH SENSITIVITY)
Troponin I (High Sensitivity): 10 ng/L (ref <18)
Troponin I (High Sensitivity): 10 ng/L (ref <18)

## 2022-11-10 LAB — BRAIN NATRIURETIC PEPTIDE: B Natriuretic Peptide: 673.2 pg/mL — ABNORMAL HIGH (ref 0.0–100.0)

## 2022-11-10 MED ORDER — POTASSIUM CHLORIDE CRYS ER 20 MEQ PO TBCR
40.0000 meq | EXTENDED_RELEASE_TABLET | Freq: Every day | ORAL | Status: DC
  Administered 2022-11-10 – 2022-11-11 (×2): 40 meq via ORAL
  Filled 2022-11-10 (×2): qty 2

## 2022-11-10 MED ORDER — SODIUM CHLORIDE 0.9 % IV SOLN
500.0000 mg | Freq: Once | INTRAVENOUS | Status: AC
  Administered 2022-11-10: 500 mg via INTRAVENOUS
  Filled 2022-11-10: qty 5

## 2022-11-10 MED ORDER — ENOXAPARIN SODIUM 40 MG/0.4ML IJ SOSY
40.0000 mg | PREFILLED_SYRINGE | INTRAMUSCULAR | Status: DC
  Administered 2022-11-11: 40 mg via SUBCUTANEOUS
  Filled 2022-11-10 (×2): qty 0.4

## 2022-11-10 MED ORDER — SACUBITRIL-VALSARTAN 97-103 MG PO TABS
1.0000 | ORAL_TABLET | Freq: Two times a day (BID) | ORAL | Status: DC
  Administered 2022-11-10 – 2022-11-11 (×3): 1 via ORAL
  Filled 2022-11-10 (×5): qty 1

## 2022-11-10 MED ORDER — DAPAGLIFLOZIN PROPANEDIOL 10 MG PO TABS
10.0000 mg | ORAL_TABLET | Freq: Every day | ORAL | Status: DC
  Administered 2022-11-11: 10 mg via ORAL
  Filled 2022-11-10: qty 1

## 2022-11-10 MED ORDER — FUROSEMIDE 10 MG/ML IJ SOLN
80.0000 mg | Freq: Once | INTRAMUSCULAR | Status: AC
  Administered 2022-11-10: 80 mg via INTRAVENOUS
  Filled 2022-11-10: qty 8

## 2022-11-10 MED ORDER — GABAPENTIN 300 MG PO CAPS
300.0000 mg | ORAL_CAPSULE | Freq: Every day | ORAL | Status: DC
  Administered 2022-11-10: 300 mg via ORAL
  Filled 2022-11-10: qty 1

## 2022-11-10 MED ORDER — ACETAMINOPHEN 325 MG PO TABS
650.0000 mg | ORAL_TABLET | Freq: Four times a day (QID) | ORAL | Status: DC | PRN
  Administered 2022-11-10: 650 mg via ORAL
  Filled 2022-11-10: qty 2

## 2022-11-10 MED ORDER — SODIUM CHLORIDE 0.9 % IV SOLN
1.0000 g | Freq: Once | INTRAVENOUS | Status: AC
  Administered 2022-11-10: 1 g via INTRAVENOUS
  Filled 2022-11-10: qty 10

## 2022-11-10 MED ORDER — HYDROCODONE-ACETAMINOPHEN 5-325 MG PO TABS
1.0000 | ORAL_TABLET | Freq: Once | ORAL | Status: AC
  Administered 2022-11-10: 1 via ORAL
  Filled 2022-11-10: qty 1

## 2022-11-10 MED ORDER — MECLIZINE HCL 25 MG PO TABS: 25.0000 mg | ORAL_TABLET | Freq: Three times a day (TID) | ORAL | Status: DC | PRN

## 2022-11-10 MED ORDER — ACETAMINOPHEN 650 MG RE SUPP: 650.0000 mg | Freq: Four times a day (QID) | RECTAL | Status: DC | PRN

## 2022-11-10 MED ORDER — AZITHROMYCIN 250 MG PO TABS
250.0000 mg | ORAL_TABLET | Freq: Two times a day (BID) | ORAL | Status: DC
  Administered 2022-11-11: 250 mg via ORAL
  Filled 2022-11-10: qty 1

## 2022-11-10 MED ORDER — POTASSIUM CHLORIDE CRYS ER 20 MEQ PO TBCR: 60.0000 meq | EXTENDED_RELEASE_TABLET | Freq: Two times a day (BID) | ORAL | Status: DC

## 2022-11-10 MED ORDER — IOHEXOL 350 MG/ML SOLN
75.0000 mL | Freq: Once | INTRAVENOUS | Status: AC | PRN
  Administered 2022-11-10: 75 mL via INTRAVENOUS

## 2022-11-10 MED ORDER — SODIUM CHLORIDE 0.9 % IV SOLN
2.0000 g | INTRAVENOUS | Status: DC
  Administered 2022-11-11: 2 g via INTRAVENOUS
  Filled 2022-11-10: qty 20

## 2022-11-10 MED ORDER — CYCLOBENZAPRINE HCL 10 MG PO TABS: 10.0000 mg | ORAL_TABLET | Freq: Three times a day (TID) | ORAL | Status: DC | PRN

## 2022-11-10 NOTE — ED Provider Notes (Signed)
[place] [facility] AT [facility] Provider Note   CSN: [id number] Arrival date & time: 11/10/22  7253     History  Chief Complaint  Patient presents with   Chest Pain   Shortness of Breath    Tara Bradshaw is a 37 y.o. female. Patient with medical history significant for ICD, congestive heart failure, cardiomyopathy, vagus nerve stimulator presents to the emergency department complaining of shortness of breath that began at 10 pm. The patient states she felt like she couldn't breathe and began to have "coughing fits". The patient also complains of a chest pain the right lateral portion of her chest that coincides with the coughing. She states that she has noticed increased bilateral ankle swelling over the past few days. She has been taking her diuretics with no relief of symptoms.  The patient denies abdominal pain, nausea, headaches, fevers.   HPI     Home Medications Prior to Admission medications   Medication Sig Start Date End Date Taking? Authorizing Provider  acetaminophen (TYLENOL) 325 MG tablet Take 650 mg by mouth every 6 (six) hours as needed for mild pain or headache.    [provider]  cyclobenzaprine (FLEXERIL) 10 MG tablet Take 1 tablet (10 mg total) by mouth 3 (three) times daily as needed for muscle spasms. 07/18/20   Roxy Horseman, PA-C  dapagliflozin propanediol (FARXIGA) 10 MG TABS tablet Take 1 tablet (10 mg total) by mouth daily before breakfast. 04/13/22   Lyn Records, MD  gabapentin (NEURONTIN) 300 MG capsule Take 1 capsule (300 mg total) by mouth daily. Patient taking differently: Take 300 mg by mouth at bedtime. 03/12/22   Nada Libman, MD  levonorgestrel (MIRENA) 20 MCG/24HR IUD 1 each by Intrauterine route once.    [provider]  meclizine (ANTIVERT) 25 MG tablet Take 1 tablet (25 mg total) by mouth 3 (three) times daily as needed for dizziness. 08/23/22   Bensimhon, Bevelyn Buckles, MD  metolazone (ZAROXOLYN) 2.5  MG tablet Take 1 tablet (2.5 mg total) by mouth daily for 2 days. With 40 meq of potassium 09/05/22 09/24/22  Alen Bleacher, NP  metoprolol succinate (TOPROL-XL) 25 MG 24 hr tablet Take 0.5 tablets (12.5 mg total) by mouth at bedtime. 09/05/22   Alen Bleacher, NP  Multiple Vitamins-Minerals (WOMENS MULTIVITAMIN PO) Take 1 tablet by mouth daily.    [provider]  NON FORMULARY Pt uses a cpap nightly    [provider]  potassium chloride SA (KLOR-CON M) 20 MEQ tablet Take 3 tablets (60 mEq total) by mouth 2 (two) times daily. 09/26/22   Milford, Anderson Malta, FNP  sacubitril-valsartan (ENTRESTO) 97-103 MG Take 1 tablet by mouth 2 (two) times daily. 04/13/22   Lyn Records, MD  Semaglutide,0.25 or 0.5MG /DOS, 2 MG/3ML SOPN Inject 0.25mg  once a week for 4 weeks then increase to 0.5mg  once a week 05/08/22   Bensimhon, Bevelyn Buckles, MD  spironolactone (ALDACTONE) 25 MG tablet Take 1 tablet (25 mg total) by mouth daily. 04/13/22   Lyn Records, MD  torsemide (DEMADEX) 20 MG tablet Take 3 tablets (60 mg total) by mouth 2 (two) times daily. 09/26/22   Jacklynn Ganong, FNP      Allergies    Patient has no known allergies.    Review of Systems   Review of Systems  Physical Exam Updated Vital Signs BP 119/81 (BP Location: Right Arm)   Pulse 98   Temp 98.3 F (36.8 C) (Oral)  Resp (!) 25   Ht 5\' 4"  (1.626 m)   Wt 117 kg   SpO2 100%   BMI 44.29 kg/m  Physical Exam Vitals and nursing note reviewed.  Constitutional:      General: She is not in acute distress.    Appearance: She is obese.  HENT:     Head: Normocephalic and atraumatic.  Eyes:     Extraocular Movements: Extraocular movements intact.  Cardiovascular:     Rate and Rhythm: Normal rate and regular rhythm.  Pulmonary:     Effort: Tachypnea present.     Breath sounds: Examination of the right-lower field reveals rales. Examination of the left-lower field reveals rales. Rales present.  Chest:     Chest wall: No tenderness.   Abdominal:     Palpations: Abdomen is soft.     Tenderness: There is no abdominal tenderness.  Musculoskeletal:     Cervical back: Neck supple.     Right lower leg: Edema present.     Left lower leg: Edema present.     Comments: Mild bilateral nonpitting edema  Skin:    General: Skin is warm and dry.  Neurological:     Mental Status: She is alert.     ED Results / Procedures / Treatments   Labs (all labs ordered are listed, but only abnormal results are displayed) Labs Reviewed  BASIC METABOLIC PANEL - Abnormal; Notable for the following components:      Result Value   Glucose, Bld 110 (*)    All other components within normal limits  CBC - Abnormal; Notable for the following components:   WBC 15.3 (*)    Hemoglobin 11.7 (*)    All other components within normal limits  BRAIN NATRIURETIC PEPTIDE - Abnormal; Notable for the following components:   B Natriuretic Peptide 673.2 (*)    All other components within normal limits  HCG, SERUM, QUALITATIVE  TROPONIN I (HIGH SENSITIVITY)  TROPONIN I (HIGH SENSITIVITY)    EKG EKG Interpretation Date/Time:  Saturday November 10 2022 05:02:20 EDT Ventricular Rate:  93 PR Interval:  165 QRS Duration:  126 QT Interval:  366 QTC Calculation: 456 R Axis:   -37  Text Interpretation: Sinus rhythm Multiform ventricular premature complexes Nonspecific IVCD with LAD LVH with secondary repolarization abnormality Artifact in lead(s) II III V1 V2 Confirmed by Nicanor Alcon, April ([id number]) on 11/10/2022 5:05:33 AM  Radiology DG Chest 2 View  Result Date: 11/10/2022 CLINICAL DATA:  Shortness of breath, chest pain EXAM: CHEST - 2 VIEW COMPARISON:  04/11/2022 FINDINGS: Cardiomegaly with left chest single lead defibrillator. Mild diffuse interstitial opacity, unchanged. Additional right chest vagal stimulator control box. Osseous structures unremarkable. IMPRESSION: Cardiomegaly with mild diffuse interstitial opacity, unchanged, most consistent with edema.  No new or focal airspace opacity. Electronically Signed   By: Jearld Lesch M.D.   On: 11/10/2022 05:39    Procedures Procedures    Medications Ordered in ED Medications  HYDROcodone-acetaminophen (NORCO/VICODIN) 5-325 MG per tablet 1 tablet (1 tablet Oral Given 11/10/22 0546)  furosemide (LASIX) injection 80 mg (80 mg Intravenous Given 11/10/22 0557)    ED Course/ Medical Decision Making/ A&P                                 Medical Decision Making Amount and/or Complexity of Data Reviewed Labs: ordered. Radiology: ordered.   This patient presents to the ED for concern of shortness of breath,  this involves an extensive number of treatment options, and is a complaint that carries with it a high risk of complications and morbidity.  The differential diagnosis includes PE, CHF exacerbation, ACS, pneumonia, and others   Co morbidities that complicate the patient evaluation  CHF, cardiomyopathy, ICD   Additional history obtained:   External records from outside source obtained and reviewed including cardiology notes   Lab Tests:  I Ordered, and personally interpreted labs.  The pertinent results include:  BNP 673.2 (outpatient values over past two months have ranged from 652 to 415), troponin 10, WBC 15.3   Imaging Studies ordered:  I ordered imaging studies including chest x-ray, CT angio chest PE study  I independently visualized and interpreted imaging which showed  Cardiomegaly with mild diffuse interstitial opacity, unchanged, most  consistent with edema. No new or focal airspace opacity.   I agree with the radiologist interpretation   Cardiac Monitoring: / EKG:  The patient was maintained on a cardiac monitor.  I personally viewed and interpreted the cardiac monitored which showed an underlying rhythm of: sinus tachycardia    Problem List / ED Course / Critical interventions / Medication management   I ordered medication including lasix for fluid overload,  norco for back pain  Reevaluation of the patient after these medicines showed that the patient improved I have reviewed the patients home medicines and have made adjustments as needed   Test / Admission - Considered:  Patient with elevated BNP, consistent with patient's recent outpatient labs.  Lasix administered. CT angio PE study pending at time of shift handoff. Patient care being transferred to [facility], PA-C. Disposition pending results of CT angio PE study, re-evaluation after lasix. Patient will also need to ambulate with pulse oximetry.          Final Clinical Impression(s) / ED Diagnoses Final diagnoses:  None    Rx / DC Orders ED Discharge Orders     None         Pamala Duffel 11/10/22 0631    Palumbo, April, MD 11/10/22 [phone]

## 2022-11-10 NOTE — ED Triage Notes (Signed)
Presents to ed c/o sob and chest tightness  onset last pm. States she has increased swelling in her ankles increased fluid pills without relief. Unable to speak more than 1-2 words.

## 2022-11-10 NOTE — ED Provider Notes (Signed)
Physical Exam  BP (!) 118/90   Pulse 76   Temp 98.2 F (36.8 C) (Oral)   Resp 14   Ht 5\' 4"  (1.626 m)   Wt 117 kg   SpO2 99%   BMI 44.29 kg/m   Physical Exam Vitals reviewed.  Constitutional:      General: She is not in acute distress.    Appearance: Normal appearance. She is not ill-appearing or toxic-appearing.  Eyes:     General: No scleral icterus. Pulmonary:     Effort: Pulmonary effort is normal. No respiratory distress.  Skin:    General: Skin is dry.  Neurological:     General: No focal deficit present.     Mental Status: She is alert. Mental status is at baseline.  Psychiatric:        Mood and Affect: Mood normal.     Procedures  Procedures  ED Course / MDM   Clinical Course as of 11/10/22 0917  Sat Nov 10, 2022  0642 SOB started last night. Coughing up phelgm occasional. Mainly dry cough. Took an extra dose of diuretic without relief last night.  [RR]    Clinical Course User Index [RR] Achille Rich, PA-C   Medical Decision Making Amount and/or Complexity of Data Reviewed Labs: ordered. Radiology: ordered.  Risk Prescription drug management. Decision regarding hospitalization.   Accepted handoff at shift change from [facility], [place]. Please see prior provider note for more detail.   Briefly: Patient is 37 y.o. F with ICD, congestive heart failure, cardiomyopathy, vagus nerve stimulator presents to the emergency department complaining of shortness of breath that began at 10 pm.  She tried to increase her Lasix because of her increased bilateral ankle swelling over the past few days but that has not changed.  She also reports that she is in having some coughing as well and has chest pain associated with her coughing.  She denies any fevers, runny nose, nasal congestion.  DDX: concern for PE  Plan: Follow-up on CT imaging  CT imaging shows 1. CT findings are most consistent with multilobar pneumonia primarily affecting the right upper and  lower lobes with minor involvement of the right middle and left lower lobe. 2. No evidence of pulmonary embolus. 3. No pleural effusion. 4. Cardiomegaly with left ventricular dilation.  Patient is shortness of breath likely due to her multilobar pneumonia.  Will start her on azithromycin and Rocephin.  Vital signs are stable, she does have a white count however it does not appear to be septic appearing.  Does not meet any SIRS criteria.  Because of her multiple comorbidities, her port score recommends admission.  I discussed the lab results with patient at bedside who is amenable to admission.  Internal medicine teaching service to admit.  Results for orders placed or performed during the hospital encounter of 11/10/22  Basic metabolic panel  Result Value Ref Range   Sodium 137 135 - 145 mmol/L   Potassium 3.7 3.5 - 5.1 mmol/L   Chloride 106 98 - 111 mmol/L   CO2 22 22 - 32 mmol/L   Glucose, Bld 110 (H) 70 - 99 mg/dL   BUN 11 6 - 20 mg/dL   Creatinine, Ser 4.78 0.44 - 1.00 mg/dL   Calcium 9.6 8.9 - 29.5 mg/dL   GFR, Estimated >62 >13 mL/min   Anion gap 9 5 - 15  CBC  Result Value Ref Range   WBC 15.3 (H) 4.0 - 10.5 K/uL   RBC 4.38 3.87 -  5.11 MIL/uL   Hemoglobin 11.7 (L) 12.0 - 15.0 g/dL   HCT 57.8 46.9 - 62.9 %   MCV 83.1 80.0 - 100.0 fL   MCH 26.7 26.0 - 34.0 pg   MCHC 32.1 30.0 - 36.0 g/dL   RDW 52.8 41.3 - 24.4 %   Platelets 215 150 - 400 K/uL   nRBC 0.0 0.0 - 0.2 %  hCG, serum, qualitative  Result Value Ref Range   Preg, Serum NEGATIVE NEGATIVE  Brain natriuretic peptide  Result Value Ref Range   B Natriuretic Peptide 673.2 (H) 0.0 - 100.0 pg/mL  Troponin I (High Sensitivity)  Result Value Ref Range   Troponin I (High Sensitivity) 10 <18 ng/L  Troponin I (High Sensitivity)  Result Value Ref Range   Troponin I (High Sensitivity) 10 <18 ng/L   CT Angio Chest PE W and/or Wo Contrast  Result Date: 11/10/2022 CLINICAL DATA:  Shortness of breath, chest pain.  Evaluate for  PE. EXAM: CT ANGIOGRAPHY CHEST WITH CONTRAST TECHNIQUE: Multidetector CT imaging of the chest was performed using the standard protocol during bolus administration of intravenous contrast. Multiplanar CT image reconstructions and MIPs were obtained to evaluate the vascular anatomy. RADIATION DOSE REDUCTION: This exam was performed according to the departmental dose-optimization program which includes automated exposure control, adjustment of the mA and/or kV according to patient size and/or use of iterative reconstruction technique. CONTRAST:  75mL OMNIPAQUE IOHEXOL 350 MG/ML SOLN COMPARISON:  Prior CT scan of the chest 07/18/2020 FINDINGS: Cardiovascular: Satisfactory opacification of the pulmonary arteries to the segmental level. No evidence of pulmonary embolism. Cardiomegaly. Left ventricular dilation. Left subclavian approach cardiac rhythm maintenance device with defibrillator lead in the right ventricle. No pericardial effusion. Mediastinum/Nodes: Unremarkable CT appearance of the thyroid gland. No suspicious mediastinal or hilar adenopathy. No soft tissue mediastinal mass. The thoracic esophagus is unremarkable. Lungs/Pleura: Multifocal patchy airspace opacities in a peribronchovascular distribution throughout the right upper and right lower lobe. Similar but less impressive findings also present in the right middle and right lower lobe. No pneumothorax. No pleural effusion. Upper Abdomen: No acute abnormality. Partially imaged cholelithiasis. Musculoskeletal: Generator pack in the soft tissues of the right upper chest with leads extending up toward the neck. Only partially imaged. No acute fracture or aggressive appearing lytic or blastic osseous lesion. Review of the MIP images confirms the above findings. IMPRESSION: 1. CT findings are most consistent with multilobar pneumonia primarily affecting the right upper and lower lobes with minor involvement of the right middle and left lower lobe. 2. No evidence  of pulmonary embolus. 3. No pleural effusion. 4. Cardiomegaly with left ventricular dilation. Electronically Signed   By: Malachy Moan M.D.   On: 11/10/2022 06:52   DG Chest 2 View  Result Date: 11/10/2022 CLINICAL DATA:  Shortness of breath, chest pain EXAM: CHEST - 2 VIEW COMPARISON:  04/11/2022 FINDINGS: Cardiomegaly with left chest single lead defibrillator. Mild diffuse interstitial opacity, unchanged. Additional right chest vagal stimulator control box. Osseous structures unremarkable. IMPRESSION: Cardiomegaly with mild diffuse interstitial opacity, unchanged, most consistent with edema. No new or focal airspace opacity. Electronically Signed   By: Jearld Lesch M.D.   On: 11/10/2022 05:39           Achille Rich, PA-C 11/10/22 0920    Lonell Grandchild, MD 11/11/22 [phone]

## 2022-11-10 NOTE — ED Notes (Signed)
ED TO INPATIENT HANDOFF REPORT  ED Nurse Name and Phone #: Eboni Coval [phone]  S Name/Age/Gender Tara Bradshaw 37 y.o. female Room/Bed: 038C/038C  Code Status   Code Status: Full Code  Home/SNF/Other Home Patient oriented to: self, place, time, and situation Is this baseline? Yes   Triage Complete: Triage complete  Chief Complaint Multifocal pneumonia [J18.9]  Triage Note Presents to ed c/o sob and chest tightness  onset last pm. States she has increased swelling in her ankles increased fluid pills without relief. Unable to speak more than 1-2 words.    Allergies No Known Allergies  Level of Care/Admitting Diagnosis ED Disposition     ED Disposition  Admit   Condition  --   Comment  Hospital Area: [facility] [[id number]]  Level of Care: Telemetry Medical [104]  May place patient in observation at [facility] or [facility] if equivalent level of care is available:: No  Covid Evaluation: Confirmed COVID Negative  Diagnosis: Multifocal pneumonia [0981191]  Admitting Physician: Inez Catalina [[id number]  Attending Physician: Nena Polio          B Medical/Surgery History Past Medical History:  Diagnosis Date   AICD (automatic cardioverter/defibrillator) present    Anemia    CHRONIC   CHF (congestive heart failure) (HCC)    Chronic systolic dysfunction of left ventricle    GERD (gastroesophageal reflux disease)    WITH PREGNANCY   Nonischemic cardiomyopathy (HCC)    Pneumonia    x 1   PVC's (premature ventricular contractions)    Sleep apnea    Past Surgical History:  Procedure Laterality Date   CARDIAC CATHETERIZATION     DILATION AND EVACUATION N/A 11/30/2016   Procedure: DILATATION AND EVACUATION;  Surgeon: Reva Bores, MD;  Location: [facility];  Service: Gynecology;  Laterality: N/A;   ICD IMPLANT N/A 02/19/2019   Procedure: ICD IMPLANT;  Surgeon: Hillis Range, MD;  Location: [facility] INVASIVE CV LAB;  Service:  Cardiovascular;  Laterality: N/A;   LEAD REVISION/REPAIR N/A 04/16/2019   Procedure: LEAD REVISION/REPAIR;  Surgeon: Hillis Range, MD;  Location: [facility] INVASIVE CV LAB;  Service: Cardiovascular;  Laterality: N/A;   RIGHT HEART CATH N/A 04/24/2022   Procedure: RIGHT HEART CATH;  Surgeon: Dolores Patty, MD;  Location: [facility]VAS[facility] CV LAB;  Service: Cardiovascular;  Laterality: N/A;   RIGHT/LEFT HEART CATH AND CORONARY ANGIOGRAPHY N/A 01/22/2019   Procedure: RIGHT/LEFT HEART CATH AND CORONARY ANGIOGRAPHY;  Surgeon: Dolores Patty, MD;  Location: [facility] INVASIVE CV LAB;  Service: Cardiovascular;  Laterality: N/A;   WISDOM TOOTH EXTRACTION     NO ANESTHESIA     A IV Location/Drains/Wounds Patient Lines/Drains/Airways Status     Active Line/Drains/Airways     Name Placement date Placement time Site Days   Peripheral IV 11/10/22 22 G 1" Anterior;Right Forearm 11/10/22  0625  Forearm  less than 1            Intake/Output Last 24 hours  Intake/Output Summary (Last 24 hours) at 11/10/2022 1155 Last data filed at 11/10/2022 1117 Gross per 24 hour  Intake 251.78 ml  Output --  Net 251.78 ml    Labs/Imaging Results for orders placed or performed during the hospital encounter of 11/10/22 (from the past 48 hour(s))  Basic metabolic panel     Status: Abnormal   Collection Time: 11/10/22  4:26 AM  Result Value Ref Range   Sodium 137 135 - 145 mmol/L   Potassium 3.7 3.5 -  5.1 mmol/L   Chloride 106 98 - 111 mmol/L   CO2 22 22 - 32 mmol/L   Glucose, Bld 110 (H) 70 - 99 mg/dL    Comment: Glucose reference range applies only to samples taken after fasting for at least 8 hours.   BUN 11 6 - 20 mg/dL   Creatinine, Ser 7.84 0.44 - 1.00 mg/dL   Calcium 9.6 8.9 - 69.6 mg/dL   GFR, Estimated >29 >52 mL/min    Comment: (NOTE) Calculated using the CKD-EPI Creatinine Equation (2021)    Anion gap 9 5 - 15    Comment: Performed at [facility], [address] [place], [place] [address]   CBC     Status: Abnormal   Collection Time: 11/10/22  4:26 AM  Result Value Ref Range   WBC 15.3 (H) 4.0 - 10.5 K/uL   RBC 4.38 3.87 - 5.11 MIL/uL   Hemoglobin 11.7 (L) 12.0 - 15.0 g/dL   HCT 44.0 10.2 - 72.5 %   MCV 83.1 80.0 - 100.0 fL   MCH 26.7 26.0 - 34.0 pg   MCHC 32.1 30.0 - 36.0 g/dL   RDW 36.6 44.0 - 34.7 %   Platelets 215 150 - 400 K/uL   nRBC 0.0 0.0 - 0.2 %    Comment: Performed at [facility], [address] [place], [place] [address]  hCG, serum, qualitative     Status: None   Collection Time: 11/10/22  4:26 AM  Result Value Ref Range   Preg, Serum NEGATIVE NEGATIVE    Comment:        THE SENSITIVITY OF THIS METHODOLOGY IS >10 mIU/mL. Performed at [facility], [address] [place], [place] [address]   Troponin I (High Sensitivity)     Status: None   Collection Time: 11/10/22  4:26 AM  Result Value Ref Range   Troponin I (High Sensitivity) 10 <18 ng/L    Comment: (NOTE) Elevated high sensitivity troponin I (hsTnI) values and significant  changes across serial measurements may suggest ACS but many other  chronic and acute conditions are known to elevate hsTnI results.  Refer to the "Links" section for chest pain algorithms and additional  guidance. Performed at [facility], [address] [place], [place] [address]   Lipase, blood     Status: None   Collection Time: 11/10/22  4:26 AM  Result Value Ref Range   Lipase 30 11 - 51 U/L    Comment: Performed at [facility], [address], [place], [place] [address]  Brain natriuretic peptide     Status: Abnormal   Collection Time: 11/10/22  4:27 AM  Result Value Ref Range   B Natriuretic Peptide 673.2 (H) 0.0 - 100.0 pg/mL    Comment: Performed at [facility], [address] [place], [place] [address]  Troponin I (High Sensitivity)     Status: None   Collection Time: 11/10/22  6:47 AM  Result Value Ref Range   Troponin I (High Sensitivity) 10 <18 ng/L    Comment:  (NOTE) Elevated high sensitivity troponin I (hsTnI) values and significant  changes across serial measurements may suggest ACS but many other  chronic and acute conditions are known to elevate hsTnI results.  Refer to the "Links" section for chest pain algorithms and additional  guidance. Performed at [facility], [address] [place], [place] [address]    CT Angio Chest PE W  and/or Wo Contrast  Result Date: 11/10/2022 CLINICAL DATA:  Shortness of breath, chest pain.  Evaluate for PE. EXAM: CT ANGIOGRAPHY CHEST WITH CONTRAST TECHNIQUE: Multidetector CT imaging of the chest was performed using the standard protocol during bolus administration of intravenous contrast. Multiplanar CT image reconstructions and MIPs were obtained to evaluate the vascular anatomy. RADIATION DOSE REDUCTION: This exam was performed according to the departmental dose-optimization program which includes automated exposure control, adjustment of the mA and/or kV according to patient size and/or use of iterative reconstruction technique. CONTRAST:  75mL OMNIPAQUE IOHEXOL 350 MG/ML SOLN COMPARISON:  Prior CT scan of the chest 07/18/2020 FINDINGS: Cardiovascular: Satisfactory opacification of the pulmonary arteries to the segmental level. No evidence of pulmonary embolism. Cardiomegaly. Left ventricular dilation. Left subclavian approach cardiac rhythm maintenance device with defibrillator lead in the right ventricle. No pericardial effusion. Mediastinum/Nodes: Unremarkable CT appearance of the thyroid gland. No suspicious mediastinal or hilar adenopathy. No soft tissue mediastinal mass. The thoracic esophagus is unremarkable. Lungs/Pleura: Multifocal patchy airspace opacities in a peribronchovascular distribution throughout the right upper and right lower lobe. Similar but less impressive findings also present in the right middle and right lower lobe. No pneumothorax. No pleural effusion. Upper Abdomen: No acute abnormality.  Partially imaged cholelithiasis. Musculoskeletal: Generator pack in the soft tissues of the right upper chest with leads extending up toward the neck. Only partially imaged. No acute fracture or aggressive appearing lytic or blastic osseous lesion. Review of the MIP images confirms the above findings. IMPRESSION: 1. CT findings are most consistent with multilobar pneumonia primarily affecting the right upper and lower lobes with minor involvement of the right middle and left lower lobe. 2. No evidence of pulmonary embolus. 3. No pleural effusion. 4. Cardiomegaly with left ventricular dilation. Electronically Signed   By: Malachy Moan M.D.   On: 11/10/2022 06:52   DG Chest 2 View  Result Date: 11/10/2022 CLINICAL DATA:  Shortness of breath, chest pain EXAM: CHEST - 2 VIEW COMPARISON:  04/11/2022 FINDINGS: Cardiomegaly with left chest single lead defibrillator. Mild diffuse interstitial opacity, unchanged. Additional right chest vagal stimulator control box. Osseous structures unremarkable. IMPRESSION: Cardiomegaly with mild diffuse interstitial opacity, unchanged, most consistent with edema. No new or focal airspace opacity. Electronically Signed   By: Jearld Lesch M.D.   On: 11/10/2022 05:39    Pending Labs Unresulted Labs (From admission, onward)     Start     Ordered   11/11/22 0500  Basic metabolic panel  Tomorrow morning,   R        11/10/22 1116   11/11/22 0500  CBC  Tomorrow morning,   R        11/10/22 1116   11/10/22 1119  Legionella Pneumophila Serogp 1 Ur Ag  Once,   R        11/10/22 1118   11/10/22 1119  Strep pneumoniae urinary antigen  Once,   R        11/10/22 1118   11/10/22 1119  Expectorated Sputum Assessment w Gram Stain, Rflx to Resp Cult  Once,   R        11/10/22 1118   11/10/22 1100  HIV Antibody (routine testing w rflx)  Once,   R        11/10/22 1100   11/10/22 0917  Urinalysis, Routine w reflex microscopic -Urine, Clean Catch  Once,   R       Question:  Specimen  Source  Answer:  Urine, Clean Catch  11/10/22 0917            Vitals/Pain Today's Vitals   11/10/22 0800 11/10/22 0900 11/10/22 1015 11/10/22 1123  BP: 111/72 (!) 118/90 93/69   Pulse: 87 76 74   Resp: 20 14 13    Temp:    97.8 F (36.6 C)  TempSrc:      SpO2: 97% 99% 98%   Weight:      Height:      PainSc:        Isolation Precautions No active isolations  Medications Medications  enoxaparin (LOVENOX) injection 40 mg (40 mg Subcutaneous Not Given 11/10/22 1000)  acetaminophen (TYLENOL) tablet 650 mg (has no administration in time range)    Or  acetaminophen (TYLENOL) suppository 650 mg (has no administration in time range)  azithromycin (ZITHROMAX) tablet 250 mg (has no administration in time range)  cefTRIAXone (ROCEPHIN) 2 g in sodium chloride 0.9 % 100 mL IVPB (has no administration in time range)  cyclobenzaprine (FLEXERIL) tablet 10 mg (has no administration in time range)  dapagliflozin propanediol (FARXIGA) tablet 10 mg (has no administration in time range)  gabapentin (NEURONTIN) capsule 300 mg (has no administration in time range)  meclizine (ANTIVERT) tablet 25 mg (has no administration in time range)  potassium chloride SA (KLOR-CON M) CR tablet 60 mEq (has no administration in time range)  sacubitril-valsartan (ENTRESTO) 97-103 mg per tablet (has no administration in time range)  HYDROcodone-acetaminophen (NORCO/VICODIN) 5-325 MG per tablet 1 tablet (1 tablet Oral Given 11/10/22 0546)  furosemide (LASIX) injection 80 mg (80 mg Intravenous Given 11/10/22 0557)  iohexol (OMNIPAQUE) 350 MG/ML injection 75 mL (75 mLs Intravenous Contrast Given 11/10/22 0640)  cefTRIAXone (ROCEPHIN) 1 g in sodium chloride 0.9 % 100 mL IVPB (0 g Intravenous Stopped 11/10/22 0856)  azithromycin (ZITHROMAX) 500 mg in sodium chloride 0.9 % 250 mL IVPB (0 mg Intravenous Stopped 11/10/22 1117)    Mobility walks     Focused Assessments    R Recommendations: See Admitting Provider  Note  Report given to:   Additional Notes: patient is able to use bedside commode. She walks and is able to ambulate with some assistance

## 2022-11-10 NOTE — Hospital Course (Addendum)
Multifocal pneumonia Pt presented with acute onset of SOB and cough. CT Angiogram done 8/3 was negative for PE; did show multifocal pneumonia. Treated for CAP with IV Ceftriaxone 2 g daily, IV Azithromycin 250 mg BID for 5 days. Sputum culture done *** showed ***.   R CVA tenderness Pt with R CVA tenderness on exam; denies hx of kidney stones, reports adequate hydration. Denies dysuria. Differential consists of nephrolithiasis vs pyelonephritis vs musculoskeletal pain. Urinalysis showed ***.   Nonischemic cardiomyopathy Pt with hx of NICM s/p AICD, Barostim. She is followed by Dr. Gala Romney, cardiology, outpatient. Holding her outpatient Torsemide, Metolazone, Metoprolol, Spironolactone in setting of acute illness. Decreased K Cl from 60 mEq BID to 40 mEq daily in setting of holding diuretic. Continued pt on Farxiga 10 mg daily; Entresto 97-103 mg BID.   ________________

## 2022-11-10 NOTE — H&P (Cosign Needed Addendum)
Date: 11/10/2022               Patient Name:  Tara Bradshaw MRN: [id number]  DOB: Apr 15, 1985 Age / Sex: 37 y.o., female   PCP: Patient, No Pcp Per              Medical Service: Internal Medicine Teaching Service              Attending Physician: Dr. Inez Catalina, MD    First Contact: Governor Rooks, MS3 Pager: [phone]  Second Contact: Dr. Jeral Pinch Pager: [phone]  Third Contact Dr. Modena Slater Pager: [phone]       After Hours (After 5p/  First Contact Pager: [phone]  weekends / holidays): Second Contact Pager: [phone]   Chief Complaint: trouble breathing and cough  History of Present Illness:   Tara Bradshaw is a 37 y.o. F with a PMHx of Cardiomyopathy s/p AICD who presented with one night of shortness of breath and cough and intermittent upper abdominal pain.  The pt reports starting to feel poorly last night (8/2) around 10pm with shortness of breath, wheezing, and cough. She reports she would cough so much it would cause her to vomit; she denies nausea. She also reports she would vomit every time she tried to eat or drink anything. Vomit was NBNB. Cough was productive with clear sputum. She reports some presyncopal symptom with her episode last night but denies syncope. She reports epigastric abdominal pain which began last night; no radiation, no worsening with food or drink. She had one loose BM last night, otherwise she reports regular daily BMs. She denies fever, chills, dysuria, increased urgency. She has had intermittent episodes of feeling poorly with shortness of breath for the past 4 months, but nothing like her current symptoms. Denies sick contacts.  She reports her breathing is a little improved today at the time of her visit. She reports her breathing worsens with movement. She reports feeling poorly with lightheadedness after receiving IV antibiotics.  She has a history of intermittent episodes of presyncope with dizziness described as the room spinning. She  has been on meclizine for this. Symptoms improve with sitting down.  She has a history of cardiomyopathy, diagnosed in late 2019 or early 2020 after she gave birth to her daughter. She had to have AICD placed for this. She reports prior hospitalizations requiring IV diuretics, but states she has not had such a hospitalization in 2024.  She wants to be Full code. She states that, in the even that she couldn't make medical decisions for herself, she would want her Grandmother Tara Bradshaw) to make these decisions; she gives her permission to contact her grandmother.  ED Course significant for elevated BNP (though seemingly elevated at baseline), CXR showing cardiomegaly and CT Angio with findings most consistent with multilobar pneumonia affecting R upper and lower lobes without evidence of PE or pleural effusion. Pt also found to have sinus tachycardia on cardiac monitor. She was given IV Lasix 80 mg x1; hydrocodone-acetaminophen x1. She was started on IV azithromycin 500 mg x1 and IV Ceftriaxone 1 g x1.   Meds: - Torsemide - Metolazone - Metoprolol - Spironolactone - Flexeril  - Farxiga - Entresto - Potassium Cl - Ozempic started around 09/2022 Current Meds  Medication Sig   acetaminophen (TYLENOL) 325 MG tablet Take 650 mg by mouth every 6 (six) hours as needed for mild pain or headache.   cyclobenzaprine (FLEXERIL) 10 MG tablet Take 1 tablet (10 mg total) by mouth  3 (three) times daily as needed for muscle spasms.   dapagliflozin propanediol (FARXIGA) 10 MG TABS tablet Take 1 tablet (10 mg total) by mouth daily before breakfast.   gabapentin (NEURONTIN) 300 MG capsule Take 1 capsule (300 mg total) by mouth daily. (Patient taking differently: Take 300 mg by mouth at bedtime.)   levonorgestrel (MIRENA) 20 MCG/24HR IUD 1 each by Intrauterine route once.   meclizine (ANTIVERT) 25 MG tablet Take 1 tablet (25 mg total) by mouth 3 (three) times daily as needed for dizziness.   metolazone  (ZAROXOLYN) 2.5 MG tablet Take 1 tablet (2.5 mg total) by mouth daily for 2 days. With 40 meq of potassium   metoprolol succinate (TOPROL-XL) 25 MG 24 hr tablet Take 0.5 tablets (12.5 mg total) by mouth at bedtime. (Patient taking differently: Take 25 mg by mouth at bedtime.)   Multiple Vitamins-Minerals (WOMENS MULTIVITAMIN PO) Take 1 tablet by mouth daily.   NON FORMULARY Pt uses a cpap nightly   potassium chloride SA (KLOR-CON M) 20 MEQ tablet Take 3 tablets (60 mEq total) by mouth 2 (two) times daily.   sacubitril-valsartan (ENTRESTO) 97-103 MG Take 1 tablet by mouth 2 (two) times daily.   spironolactone (ALDACTONE) 25 MG tablet Take 1 tablet (25 mg total) by mouth daily.   torsemide (DEMADEX) 20 MG tablet Take 3 tablets (60 mg total) by mouth 2 (two) times daily.    Allergies: NKDA Allergies as of 11/10/2022   (No Known Allergies)   PMHx: - Nonischemic cardiomyopathy dx around 2019-2020, s/p AICD placement, s/p Barostim 01/2022 Past Medical History:  Diagnosis Date   AICD (automatic cardioverter/defibrillator) present    Anemia    CHRONIC   CHF (congestive heart failure) (HCC)    Chronic systolic dysfunction of left ventricle    GERD (gastroesophageal reflux disease)    WITH PREGNANCY   Nonischemic cardiomyopathy (HCC)    Pneumonia    x 1   PVC's (premature ventricular contractions)    Sleep apnea     Family History:  Maternal GM: HR Dad: Heart conditions, passed in his 59s Paternal GF: heart condition, GI Cancer Cousin: Open heart  Maternal aunt: Dm  Social History:  Lives in [place], with two children and boyfriend Not working since diagnosed with CM Drives herself. Has help with cleaning and cooking from family members Denies alcohol use (never drank) Denies tobacco use (never used) Denies recreational drug use  PCP: Planning to establish with [facility] Cardiologist: Dr. Gala Romney  Review of Systems: A complete ROS was negative except as per HPI.  Physical  Exam: Blood pressure (!) 160/136, pulse 83, temperature 98.4 F (36.9 C), temperature source Oral, resp. rate 13, height 5\' 4"  (1.626 m), weight 117 kg, SpO2 100%. General: Pt is lying down in hospital bed, appears in some discomfort. No acute distress. Cardiovascular: RRR, no murmurs, rubs, gallops. No JVD noted, negative hepatojugular reflex. Pulmonary: Normal work of breathing. Lungs clear to auscultation bilaterally; no wheezes, crackles. Abdomen: Normal bowel sounds. Soft and nondistended in all four quadrants. Tenderness to palpation of RUQ and epigastric region; no rebound tenderness, no guarding.  Back: R CVA tenderness; tenderness to palpation of R paraspinal muscle. Extremities: Trace nonpitting edema of BLE; warm, dry. Neuro/Psych: Alert and oriented to person, place, event. (Time not formerly assessed.) Normal affect.     Latest Ref Rng & Units 11/10/2022    4:26 AM 10/09/2022   12:16 PM 09/24/2022   12:27 PM  CMP  Glucose 70 - 99 mg/dL  110  94  91   BUN 6 - 20 mg/dL 11  <5  9   Creatinine 0.44 - 1.00 mg/dL 7.82  9.56  2.13   Sodium 135 - 145 mmol/L 137  137  138   Potassium 3.5 - 5.1 mmol/L 3.7  3.7  3.8   Chloride 98 - 111 mmol/L 106  107  104   CO2 22 - 32 mmol/L 22  22  23    Calcium 8.9 - 10.3 mg/dL 9.6  9.0  9.0       Latest Ref Rng & Units 11/10/2022    4:26 AM 04/24/2022    2:25 PM 04/24/2022    2:05 PM  CBC  WBC 4.0 - 10.5 K/uL 15.3     Hemoglobin 12.0 - 15.0 g/dL 08.6  57.8  46.9   Hematocrit 36.0 - 46.0 % 36.4  32.0  33.0   Platelets 150 - 400 K/uL 215      hCG 8/3: Negative Troponin: Negative x2 (10 @0426 , 10 @0647 )  Pending: Urinalysis, HIV Ab, sputum culture  EKG: personally reviewed my interpretation is sinus with PVCs  CXR 8/3: personally reviewed my interpretation is R-sided opacities of lower load. Cardiomegaly. L chest single lead defibrillator. R chest vagal stimulator. IMPRESSION: Cardiomegaly with mild diffuse interstitial opacity, unchanged,  most consistent with edema. No new or focal airspace opacity.  CT Angio 8/3: IMPRESSION: 1. CT findings are most consistent with multilobar pneumonia primarily affecting the right upper and lower lobes with minor involvement of the right middle and left lower lobe. 2. No evidence of pulmonary embolus. 3. No pleural effusion. 4. Cardiomegaly with left ventricular dilation.  Assessment & Plan by Problem: Principal Problem:   Multifocal pneumonia  Natalee Stigall is a 37 y.o. F with a PMHx of Cardiomyopathy s/p AICD who presented with one night of shortness of breath and cough and intermittent upper abdominal pain admitted for pneumonia.  Multifocal pneumonia Pt presented with acute onset of SOB and cough. Differential includes PNA, URI, HF exacerbation, PE, ACS. Negative Troponins x2, negative ECG, and lack of chest pain make ACS unlikely. CT Angiogram done 8/3 was negative for PE; did show multifocal pneumonia. This is most likely etiology of pt's symptoms. Can consider URI infection, though pt denies sick contacts and CT with consolidations most suspicious for PNA. HF exacerbation less likely given pt euvolemic on exam without crackles, significant edema. Will continue to treat patient for CAP with IV Ceftriaxone and IV Azithromycin for 5 days minimum. - CONTINUE IV Ceftriaxone 2 g daily, IV Azithromycin 250 mg BID; Day 1/5 - Legionella, strep, sputum culture pending  R CVA tenderness Pt with R CVA tenderness on exam; denies hx of kidney stones, reports adequate hydration. Denies dysuria. Differential consists of nephrolithiasis vs pyelonephritis vs musculoskeletal pain. Will evaluate further with urinalysis, bladder scan; consider further workup pending this. - Urinalysis pending - Bladder scan  Nonischemic cardiomyopathy Pt with hx of NICM s/p AICD, Barostim. She is followed by Dr. Gala Romney, cardiology, outpatient. Holding her outpatient Torsemide, Metolazone, Metoprolol, Spironolactone  in setting of acute illness. Will decrease K Cl from 60 mEq BID to 40 mEq daily in setting of holding diuretic. Will continue pt on Farxiga 10 mg daily; Entresto 97-103 mg BID. Consider restarting additional medications as tolerated once pt more stable from an infection perspective. - Continue Farxiga 10 mg daily before breakfast. - Continue Entresto 97-103 mg BID - REDUCE K Cl from 60 mEq BID to 40 mEq daily  Chronic Conditions: Vertigo: Continue meclizine 25 mg TID prn. Neuropathy: Continue gabapentin 300 mg daily at bedtime.  Code Status: Full Diet: Heart healthy diet IV Fluid: None DVT Prophylaxis: Lovenox  Prior to Admission Living Arrangement: Home Anticipated Discharge Location: Home Barriers to Discharge: Medical management  Dispo: Admit patient to Observation with expected length of stay less than 2 midnights.  Signed: Governor Rooks, Medical Student 11/10/2022, 12:53 PM  Pager: [phone]  Attestation for Student Documentation:  I personally was present and re-performed the history, physical exam and medical decision-making activities of this service and have verified that the service and findings are accurately documented in the student's note.  Jeral Pinch, DO 11/10/2022, 3:43 PM

## 2022-11-11 DIAGNOSIS — J189 Pneumonia, unspecified organism: Principal | ICD-10-CM

## 2022-11-11 LAB — BASIC METABOLIC PANEL
Anion gap: 11 (ref 5–15)
BUN: 12 mg/dL (ref 6–20)
CO2: 21 mmol/L — ABNORMAL LOW (ref 22–32)
Calcium: 9 mg/dL (ref 8.9–10.3)
Chloride: 104 mmol/L (ref 98–111)
Creatinine, Ser: 0.84 mg/dL (ref 0.44–1.00)
GFR, Estimated: >60 mL/min (ref >60)
Glucose, Bld: 114 mg/dL — ABNORMAL HIGH (ref 70–99)
Potassium: 3.3 mmol/L — ABNORMAL LOW (ref 3.5–5.1)
Sodium: 136 mmol/L (ref 135–145)

## 2022-11-11 LAB — URINALYSIS, ROUTINE W REFLEX MICROSCOPIC
Bilirubin Urine: NEGATIVE
Glucose, UA: NEGATIVE mg/dL
Hgb urine dipstick: NEGATIVE
Ketones, ur: NEGATIVE mg/dL
Leukocytes,Ua: NEGATIVE
Nitrite: NEGATIVE
Protein, ur: NEGATIVE mg/dL
Specific Gravity, Urine: 1.025 (ref 1.005–1.030)
pH: 5 (ref 5.0–8.0)

## 2022-11-11 LAB — CBC WITH DIFFERENTIAL/PLATELET
Abs Immature Granulocytes: 0.01 10*3/uL (ref 0.00–0.07)
Basophils Absolute: 0 10*3/uL (ref 0.0–0.1)
Basophils Relative: 0 %
Eosinophils Absolute: 0.2 10*3/uL (ref 0.0–0.5)
Eosinophils Relative: 3 %
HCT: 37.7 % (ref 36.0–46.0)
Hemoglobin: 12.8 g/dL (ref 12.0–15.0)
Immature Granulocytes: 0 %
Lymphocytes Relative: 41 %
Lymphs Abs: 3.2 10*3/uL (ref 0.7–4.0)
MCH: 27.6 pg (ref 26.0–34.0)
MCHC: 34 g/dL (ref 30.0–36.0)
MCV: 81.3 fL (ref 80.0–100.0)
Monocytes Absolute: 0.6 10*3/uL (ref 0.1–1.0)
Monocytes Relative: 7 %
Neutro Abs: 3.8 10*3/uL (ref 1.7–7.7)
Neutrophils Relative %: 49 %
Platelets: 172 10*3/uL (ref 150–400)
RBC: 4.64 MIL/uL (ref 3.87–5.11)
RDW: 14.8 % (ref 11.5–15.5)
WBC: 7.9 10*3/uL (ref 4.0–10.5)
nRBC: 0 % (ref 0.0–0.2)

## 2022-11-11 LAB — STREP PNEUMONIAE URINARY ANTIGEN: Strep Pneumo Urinary Antigen: NEGATIVE

## 2022-11-11 MED ORDER — POTASSIUM CHLORIDE CRYS ER 20 MEQ PO TBCR: 40.0000 meq | EXTENDED_RELEASE_TABLET | Freq: Every day | ORAL | 0 refills | Status: DC

## 2022-11-11 MED ORDER — AZITHROMYCIN 250 MG PO TABS: 250.0000 mg | ORAL_TABLET | Freq: Two times a day (BID) | ORAL | 0 refills | Status: AC

## 2022-11-11 MED ORDER — TORSEMIDE 10 MG PO TABS: 30.0000 mg | ORAL_TABLET | Freq: Two times a day (BID) | ORAL | 0 refills | Status: DC

## 2022-11-11 MED ORDER — POTASSIUM CHLORIDE CRYS ER 20 MEQ PO TBCR: 40.0000 meq | EXTENDED_RELEASE_TABLET | Freq: Once | ORAL | Status: DC

## 2022-11-11 MED ORDER — METOPROLOL SUCCINATE ER 25 MG PO TB24
12.5000 mg | ORAL_TABLET | Freq: Every day | ORAL | Status: DC
  Administered 2022-11-11: 12.5 mg via ORAL
  Filled 2022-11-11: qty 1

## 2022-11-11 MED ORDER — TORSEMIDE 20 MG PO TABS
30.0000 mg | ORAL_TABLET | Freq: Two times a day (BID) | ORAL | Status: DC
  Administered 2022-11-11: 30 mg via ORAL
  Filled 2022-11-11: qty 2

## 2022-11-11 MED ORDER — AMOXICILLIN-POT CLAVULANATE 500-125 MG PO TABS: 500.0000 mg | ORAL_TABLET | Freq: Two times a day (BID) | ORAL | 0 refills | Status: AC

## 2022-11-11 MED ORDER — METOPROLOL SUCCINATE ER 25 MG PO TB24: 12.5000 mg | ORAL_TABLET | Freq: Every day | ORAL | 0 refills | Status: DC

## 2022-11-11 NOTE — Plan of Care (Signed)

## 2022-11-11 NOTE — Plan of Care (Signed)
  Problem: Clinical Measurements: Goal: Will remain free from infection Outcome: Progressing   Problem: Clinical Measurements: Goal: Respiratory complications will improve Outcome: Progressing   Problem: Activity: Goal: Risk for activity intolerance will decrease Outcome: Progressing   

## 2022-11-11 NOTE — Evaluation (Signed)
Physical Therapy Evaluation Patient Details Name: Tara Bradshaw MRN: [id number] DOB: January 09, 1986 Today's Date: 11/11/2022  History of Present Illness  Pt is a 37 year old female admitted on 11/10/22 with one night of shortness of breath and cough and intermittent upper abdominal pain.  PMHx of Cardiomyopathy s/p AICD.  Clinical Impression  Pt presents with admitting diagnosis above. Pt today was able to ambulate around hallway independently with no AD. Pt presents at or near baseline mobility. Pt has no further acute PT needs and will be signing off. Re consult PT if mobility status changes.       If plan is discharge home, recommend the following: Assist for transportation   Can travel by private vehicle        Equipment Recommendations None recommended by PT  Recommendations for Other Services       Functional Status Assessment Patient has had a recent decline in their functional status and demonstrates the ability to make significant improvements in function in a reasonable and predictable amount of time.     Precautions / Restrictions Precautions Precautions: None Restrictions Weight Bearing Restrictions: No      Mobility  Bed Mobility Overal bed mobility: Modified Independent             General bed mobility comments: HOB elevated    Transfers Overall transfer level: Independent Equipment used: None                    Ambulation/Gait Ambulation/Gait assistance: Independent Gait Distance (Feet): 600 Feet Assistive device: None Gait Pattern/deviations: WFL(Within Functional Limits) Gait velocity: decreased     General Gait Details: no LOB noted. Pt was SOB however O2 sats at 98% on RA.  Stairs            Wheelchair Mobility     Tilt Bed    Modified Rankin (Stroke Patients Only)       Balance Overall balance assessment: No apparent balance deficits (not formally assessed)                                            Pertinent Vitals/Pain Pain Assessment Pain Assessment: No/denies pain    Home Living Family/patient expects to be discharged to:: Private residence Living Arrangements: Spouse/significant other;Children Available Help at Discharge: Family;Available PRN/intermittently Type of Home: House Home Access: Level entry       Home Layout: One level Home Equipment: Hand held shower head      Prior Function Prior Level of Function : Independent/Modified Independent;Driving             Mobility Comments: Ind no AD ADLs Comments: Ind     Hand Dominance   Dominant Hand: Right    Extremity/Trunk Assessment   Upper Extremity Assessment Upper Extremity Assessment: Overall WFL for tasks assessed    Lower Extremity Assessment Lower Extremity Assessment: Overall WFL for tasks assessed    Cervical / Trunk Assessment Cervical / Trunk Assessment: Normal  Communication   Communication: No difficulties  Cognition Arousal/Alertness: Awake/alert Behavior During Therapy: WFL for tasks assessed/performed Overall Cognitive Status: Within Functional Limits for tasks assessed                                          General Comments General comments (  skin integrity, edema, etc.): 98% on RA    Exercises     Assessment/Plan    PT Assessment Patient does not need any further PT services  PT Problem List         PT Treatment Interventions      PT Goals (Current goals can be found in the Care Plan section)  Acute Rehab PT Goals PT Goal Formulation: All assessment and education complete, DC therapy    Frequency       Co-evaluation               AM-PAC PT "6 Clicks" Mobility  Outcome Measure Help needed turning from your back to your side while in a flat bed without using bedrails?: None Help needed moving from lying on your back to sitting on the side of a flat bed without using bedrails?: None Help needed moving to and from a bed to a chair  (including a wheelchair)?: None Help needed standing up from a chair using your arms (e.g., wheelchair or bedside chair)?: None Help needed to walk in hospital room?: None Help needed climbing 3-5 steps with a railing? : None 6 Click Score: 24    End of Session   Activity Tolerance: Patient tolerated treatment well Patient left: in bed;with call bell/phone within reach Nurse Communication: Mobility status PT Visit Diagnosis: Other abnormalities of gait and mobility (R26.89)    Time: 6295-2841 PT Time Calculation (min) (ACUTE ONLY): 12 min   Charges:   PT Evaluation $PT Eval Low Complexity: 1 Low   PT General Charges $$ ACUTE PT VISIT: 1 Visit         Shela Nevin, PT, DPT Acute Rehab Services [phone]   Tara Bradshaw 11/11/2022, 9:23 AM

## 2022-11-11 NOTE — Discharge Summary (Addendum)
Name: Tara Bradshaw MRN: [id number] DOB: 10/23/1985 37 y.o. PCP: Patient, No Pcp Per  Date of Admission: 11/10/2022  4:17 AM Date of Discharge:  11/11/2022 Attending Physician: Dr. Criselda Peaches  DISCHARGE DIAGNOSIS:  Primary Problem: Multifocal pneumonia   Hospital Problems: Principal Problem:   Multifocal pneumonia Active Problems:   GERD (gastroesophageal reflux disease)   Cardiomyopathy (HCC)    DISCHARGE MEDICATIONS:   Allergies as of 11/11/2022   No Known Allergies      Medication List     STOP taking these medications    metolazone 2.5 MG tablet Commonly known as: ZAROXOLYN   spironolactone 25 MG tablet Commonly known as: ALDACTONE       TAKE these medications    acetaminophen 325 MG tablet Commonly known as: TYLENOL Take 650 mg by mouth every 6 (six) hours as needed for mild pain or headache.   amoxicillin-clavulanate 500-125 MG tablet Commonly known as: Augmentin Take 1 tablet by mouth in the morning and at bedtime for 3 days.   azithromycin 250 MG tablet Commonly known as: ZITHROMAX Take 1 tablet (250 mg total) by mouth in the morning and at bedtime for 3 days.   cyclobenzaprine 10 MG tablet Commonly known as: FLEXERIL Take 1 tablet (10 mg total) by mouth 3 (three) times daily as needed for muscle spasms.   dapagliflozin propanediol 10 MG Tabs tablet Commonly known as: Farxiga Take 1 tablet (10 mg total) by mouth daily before breakfast.   Entresto 97-103 MG Generic drug: sacubitril-valsartan Take 1 tablet by mouth 2 (two) times daily.   gabapentin 300 MG capsule Commonly known as: NEURONTIN Take 1 capsule (300 mg total) by mouth daily. What changed: when to take this   levonorgestrel 20 MCG/24HR IUD Commonly known as: MIRENA 1 each by Intrauterine route once.   meclizine 25 MG tablet Commonly known as: ANTIVERT Take 1 tablet (25 mg total) by mouth 3 (three) times daily as needed for dizziness.   metoprolol succinate 25 MG 24 hr  tablet Commonly known as: TOPROL-XL Take 0.5 tablets (12.5 mg total) by mouth daily. Start taking on: November 12, 2022 What changed: when to take this   NON FORMULARY Pt uses a cpap nightly   potassium chloride SA 20 MEQ tablet Commonly known as: KLOR-CON M Take 2 tablets (40 mEq total) by mouth daily. Start taking on: November 12, 2022 What changed:  how much to take when to take this   Semaglutide(0.25 or 0.5MG /DOS) 2 MG/3ML Sopn Inject 0.25mg  once a week for 4 weeks then increase to 0.5mg  once a week What changed:  how much to take how to take this when to take this additional instructions   torsemide 10 MG tablet Commonly known as: DEMADEX Take 3 tablets (30 mg total) by mouth 2 (two) times daily. What changed:  medication strength how much to take   WOMENS MULTIVITAMIN PO Take 1 tablet by mouth daily.        DISPOSITION AND FOLLOW-UP:  Ms.Tara Bradshaw was discharged from [facility] in Good condition. At the hospital follow up visit please address:  Multifocal pneumonia, : Patient was discharged with p.o. azithromycin 250 mg twice a day for 3 days, Augmentin 500-125 twice a day for 3 days. Patient completed 2 courses of antibiotics in the hospital.  Patient's urine strep pneumoniae and Legionella pending.  Please follow-up on urine antigens. Otherwise patient is stable.   Nonischemic cardiomyopathy: Patient's blood pressure readings in the hospital were on the lower side.  Patient is discharged on metoprolol succinate 12.5 mg daily, torsemide 80 mg twice daily, Farxiga 10 mg daily, Entresto 97-103 mg twice daily. The following medications were held due to low blood pressure readings, metolazone and spironolactone. Please review patient's blood pressure readings in the clinic, restart her blood pressure medications as appropriate.  Follow-up Recommendations: Consults: PCP, cardiology Labs: Basic Metabolic Profile, CBC Studies: None Medications:  Azithromycin 250 mg twice daily, Augmentin500-125 mg twice daily for 3 days.  Follow-up Appointments:  Follow-up Information     Bevelyn Buckles [facility]ensimhon. Call in 1 week(s).   Why: Hospital Follow up.        [facility]. Schedule an appointment as soon as possible for a visit in 1 day(s).   Why: Hospital Follow up               PCP at [facility] clinic: Message is sent to [facility] front desk to schedule an appointment.   Cardiology: Patient will call her doctor, Bensimhon to schedule an appointment.   HOSPITAL COURSE:  Patient Summary:  Multifocal pneumonia Pt presented with acute onset of SOB and cough. CT Angiogram done 8/3 was negative for PE; did show multifocal pneumonia.  Patient's respiratory panel was negative for viral and COVID.  Patient was treated for CAP with IV Ceftriaxone 2 g daily, IV Azithromycin 250 mg changed to p.o. azithromycin to 50 mg twice daily BID for 2 days.  Patient is otherwise hemodynamically stable.  She denies any acute chest pain, shortness of breath, abdominal pain.  No other concerns for today.  PT OT evaluated her with no recommendations.  Patient's urinary antigens are pending, which she can follow-up outpatient with her primary care provider.  R CVA tenderness Pt with R CVA tenderness on exam; denies hx of kidney stones, reports adequate hydration. Denies dysuria.  Urinalysis was negative for ketones, protein, nitrite, leukocytes. Patient's urinary antigens are pending. Currently patient denies any acute abdominal pain, flank pain, dysuria, increased urinary frequency.  Patient to follow-up outpatient PCP for her urinary antigens.  Nonischemic cardiomyopathy Pt with hx of NICM s/p AICD, [place]. She is followed by Dr. Gala Romney, cardiology, outpatient.  We initially held her outpatient Torsemide, Metolazone, Metoprolol, Spironolactone in setting of acute illness. Decreased K Cl from 60 mEq BID to 40 mEq daily in setting of holding diuretic.  Continued pt on Farxiga 10 mg daily; Entresto 97-103 mg BID.  Currently, as the patient symptoms are improving, we started her back on her torsemide 30 mg twice daily, metoprolol 12.5 mg daily. We held her metolazone and spironolactone in the setting of low blood pressure.  Patient will follow-up outpatient with her cardiology to titrate her medication as appropriate.  DISCHARGE INSTRUCTIONS:  Hello Ms. Braxton,  You came into the hospital for symptoms of shortness of breath and abdominal pain, and you were treated for multifocal pneumonia with antibiotics.  For your multifocal pneumonia: We have you on the following medications: - Azithromycin (Zithromax) 250 mg, take 1 tablet by mouth in the morning and at bedtime for 3 days - Augmentin (amoxicillin-clavulanate) 500-125 mg, 1 tablet by mouth in the morning and at bedtime for 3 days.   For your nonischemic cardiomyopathy: We change the following home medications doses:  -Metoprolol succinate (Toprol-XL) 12.5 mg, take 1 tablet by mouth daily -Torsemide 10 mg, take 3 tablets by mouth twice daily  Continue the following medication as prescribed: -Entresto 97-103 mg, take 1 tablet by mouth 2 times daily -Farxiga 10 mg, take  1 tablet by mouth daily  We held the following medication in the setting of low blood pressure: Please see your cardiologist prior to starting these medications.  Please monitor your blood pressures at home.  - Metolazone 2.5 mg, spironolactone 25 mg   Please take the rest of your medications as prescribed.  Please follow-up with your primary care doctor in 7 to 10 days. Please follow-up with your cardiologist, and schedule an appointment with them.  If you have any of these following symptoms, please call us or seek care at an emergency department: -Chest Pain -Difficulty Breathing -Worsening abdominal pain -Syncope (passing out) -Drooping of face -Slurred speech -Sudden weakness in your leg or  arm -Fever -Chills -blood in the stool -dark black, sticky stool  We are glad that you are feeling better, it was a pleasure to care for you!  I have issue good recovery and good health.  Mariadejesus Cade, DO  SUBJECTIVE:  Was evaluated at bedside.  Patient was overall doing well. She denies any acute complaints of chest pain, shortness of breath, abdominal pain, flank pain, dysuria, headaches.  Patient has no acute concerns at this time.  Patient reports she is ready to go home.  Discharge Vitals:   BP 100/61   Pulse 61   Temp 98.2 F (36.8 C) (Oral)   Resp 18   Ht 5\' 4"  (1.626 m)   Wt 117.3 kg   SpO2 100%   BMI 44.39 kg/m   OBJECTIVE:  Physical Exam   General: Pt is lying down in hospital bed, appears comfortable.  No acute distress.   Cardiovascular: RRR, no murmurs, rubs, gallops.  Pulmonary: Normal work of breathing. Lungs clear to auscultation bilaterally; no wheezes, crackles. Abdomen: Normal bowel sounds. Soft and nondistended in all four quadrants.  No tenderness to palpation.   Back: No tenderness to palpation at the right CVA.   Extremities: No bilateral lower extremity pitting edema.  2+ dorsal pedis pulses intact. Neuro: No focal deficits. 5/5 strength bilaterally. Psych: Normal mood and affect  Pertinent Labs, Studies, and Procedures:     Latest Ref Rng & Units 11/11/2022    2:18 AM 11/10/2022    4:26 AM 04/24/2022    2:25 PM  CBC  WBC 4.0 - 10.5 K/uL 7.9  15.3    Hemoglobin 12.0 - 15.0 g/dL 29.5  62.1  30.8   Hematocrit 36.0 - 46.0 % 37.7  36.4  32.0   Platelets 150 - 400 K/uL 172  215         Latest Ref Rng & Units 11/11/2022    3:58 AM 11/10/2022    4:26 AM 10/09/2022   12:16 PM  CMP  Glucose 70 - 99 mg/dL 657  846  94   BUN 6 - 20 mg/dL 12  11  <5   Creatinine 0.44 - 1.00 mg/dL 9.62  9.52  8.41   Sodium 135 - 145 mmol/L 136  137  137   Potassium 3.5 - 5.1 mmol/L 3.3  3.7  3.7   Chloride 98 - 111 mmol/L 104  106  107   CO2 22 - 32 mmol/L 21  22  22     Calcium 8.9 - 10.3 mg/dL 9.0  9.6  9.0     CT Angio Chest PE W and/or Wo Contrast  Result Date: 11/10/2022 CLINICAL DATA:  Shortness of breath, chest pain.  Evaluate for PE. EXAM: CT ANGIOGRAPHY CHEST WITH CONTRAST TECHNIQUE: Multidetector CT imaging of the chest was performed  using the standard protocol during bolus administration of intravenous contrast. Multiplanar CT image reconstructions and MIPs were obtained to evaluate the vascular anatomy. RADIATION DOSE REDUCTION: This exam was performed according to the departmental dose-optimization program which includes automated exposure control, adjustment of the mA and/or kV according to patient size and/or use of iterative reconstruction technique. CONTRAST:  75mL OMNIPAQUE IOHEXOL 350 MG/ML SOLN COMPARISON:  Prior CT scan of the chest 07/18/2020 FINDINGS: Cardiovascular: Satisfactory opacification of the pulmonary arteries to the segmental level. No evidence of pulmonary embolism. Cardiomegaly. Left ventricular dilation. Left subclavian approach cardiac rhythm maintenance device with defibrillator lead in the right ventricle. No pericardial effusion. Mediastinum/Nodes: Unremarkable CT appearance of the thyroid gland. No suspicious mediastinal or hilar adenopathy. No soft tissue mediastinal mass. The thoracic esophagus is unremarkable. Lungs/Pleura: Multifocal patchy airspace opacities in a peribronchovascular distribution throughout the right upper and right lower lobe. Similar but less impressive findings also present in the right middle and right lower lobe. No pneumothorax. No pleural effusion. Upper Abdomen: No acute abnormality. Partially imaged cholelithiasis. Musculoskeletal: Generator pack in the soft tissues of the right upper chest with leads extending up toward the neck. Only partially imaged. No acute fracture or aggressive appearing lytic or blastic osseous lesion. Review of the MIP images confirms the above findings. IMPRESSION: 1. CT findings  are most consistent with multilobar pneumonia primarily affecting the right upper and lower lobes with minor involvement of the right middle and left lower lobe. 2. No evidence of pulmonary embolus. 3. No pleural effusion. 4. Cardiomegaly with left ventricular dilation. Electronically Signed   By: Malachy Moan M.D.   On: 11/10/2022 06:52   DG Chest 2 View  Result Date: 11/10/2022 CLINICAL DATA:  Shortness of breath, chest pain EXAM: CHEST - 2 VIEW COMPARISON:  04/11/2022 FINDINGS: Cardiomegaly with left chest single lead defibrillator. Mild diffuse interstitial opacity, unchanged. Additional right chest vagal stimulator control box. Osseous structures unremarkable. IMPRESSION: Cardiomegaly with mild diffuse interstitial opacity, unchanged, most consistent with edema. No new or focal airspace opacity. Electronically Signed   By: Jearld Lesch M.D.   On: 11/10/2022 05:39     Signed: Jeral Pinch, D.O.  Internal Medicine Resident, PGY-1 [facility]  Pager: [phone] 11:31 AM, 11/11/2022

## 2022-11-11 NOTE — Discharge Instructions (Addendum)
Hello Ms. Sterba,  You came into the hospital for symptoms of shortness of breath and abdominal pain, and you were treated for multifocal pneumonia with antibiotics.  For your multifocal pneumonia: We have you on the following medications: - Azithromycin (Zithromax) 250 mg, take 1 tablet by mouth in the morning and at bedtime for 3 days - Augmentin (amoxicillin-clavulanate) 500-125 mg, 1 tablet by mouth in the morning and at bedtime for 3 days  For your nonischemic cardiomyopathy: We change the following home medications doses:  -Metoprolol succinate (Toprol-XL) 12.5 mg, take 1 tablet by mouth daily -Torsemide 10 mg, take 3 tablets by mouth twice daily  Continue the following medication as prescribed: -Entresto 97-103 mg, take 1 tablet by mouth 2 times daily -Farxiga 10 mg, take 1 tablet by mouth daily  We held the following medication in the setting of low blood pressure: Please see your cardiologist prior to starting these medications.  Please monitor your blood pressures at home.  - Metolazone 2.5 mg, spironolactone 25 mg   Please take the rest of your medications as prescribed.  Please follow-up with your primary care doctor in 7 to 10 days. Please follow-up with your cardiologist, and make an appointment.   If you have any of these following symptoms, please call us or seek care at an emergency department: -Chest Pain -Difficulty Breathing -Worsening abdominal pain -Syncope (passing out) -Drooping of face -Slurred speech -Sudden weakness in your leg or arm -Fever -Chills -blood in the stool -dark black, sticky stool  We are glad that you are feeling better, it was a pleasure to care for you!  Jeral Pinch, DO, PGY1

## 2022-11-11 NOTE — Evaluation (Signed)
Occupational Therapy Evaluation Patient Details Name: Tara Bradshaw MRN: [id number] DOB: 12-Jul-1985 Today's Date: 11/11/2022   History of Present Illness Pt is a 37 year old female admitted on 11/10/22 with one night of shortness of breath and cough and intermittent upper abdominal pain.  PMHx of Cardiomyopathy s/p AICD.   Clinical Impression   Pt seen after collaboration with PT in regard to need for further education. PTA, pt independent and lived with her family. Upon eval, pt presenting with decreased activity tolerance, but overall able to perform ADL with mod I. Pt reporting when she gets up to restroom, especially at night, she becomes [facility] and often is fearful of being able to maintain her SpO2, thus, recommending BSC to optimize safety. Pt educated and providing handout regarding energy conservation; identifying 3+ strategies for home use specific to pt's context during session. Encouraged frequent mobility as well as reviewed recommendations for fluid management at home. Recommending discharge home with no OT follow up at this time.      Recommendations for follow up therapy are one component of a multi-disciplinary discharge planning process, led by the attending physician.  Recommendations may be updated based on patient status, additional functional criteria and insurance authorization.   Assistance Recommended at Discharge PRN  Patient can return home with the following Assistance with cooking/housework    Functional Status Assessment  Patient has had a recent decline in their functional status and demonstrates the ability to make significant improvements in function in a reasonable and predictable amount of time.  Equipment Recommendations  BSC/3in1    Recommendations for Other Services       Precautions / Restrictions Precautions Precautions: None Restrictions Weight Bearing Restrictions: No      Mobility Bed Mobility Overal bed mobility: Modified Independent              General bed mobility comments: HOB elevated    Transfers Overall transfer level: Independent Equipment used: None                      Balance Overall balance assessment: No apparent balance deficits (not formally assessed)                                         ADL either performed or assessed with clinical judgement   ADL Overall ADL's : Modified independent                                       General ADL Comments: Increased time and intermittent rest breaks. Good ability to determine need for rest break this session, but reports she has a tendency to overdo things at home. Believes she can implement energy conservation strategies in the home setting to help with this and has identified 3+ that seem feasible for her after initial education     Vision Baseline Vision/History: 0 No visual deficits Ability to See in Adequate Light: 0 Adequate Patient Visual Report: No change from baseline Vision Assessment?: No apparent visual deficits     Perception Perception Perception Tested?: No   Praxis Praxis Praxis tested?: Not tested    Pertinent Vitals/Pain Pain Assessment Pain Assessment: No/denies pain     Hand Dominance Right   Extremity/Trunk Assessment Upper Extremity Assessment Upper Extremity Assessment: Overall WFL for tasks assessed  Lower Extremity Assessment Lower Extremity Assessment: Overall WFL for tasks assessed   Cervical / Trunk Assessment Cervical / Trunk Assessment: Normal   Communication Communication Communication: No difficulties   Cognition Arousal/Alertness: Awake/alert Behavior During Therapy: WFL for tasks assessed/performed Overall Cognitive Status: Within Functional Limits for tasks assessed                                 General Comments: Very receptive of all education     General Comments  98% on RA. OT encouraged freuqent mobility and OOB to chair     Exercises     Shoulder Instructions      Home Living Family/patient expects to be discharged to:: Private residence Living Arrangements: Spouse/significant other;Children Available Help at Discharge: Family;Available PRN/intermittently Type of Home: House Home Access: Level entry     Home Layout: One level     Bathroom Shower/Tub: Chief Strategy Officer: Standard Bathroom Accessibility: Yes   Home Equipment: Hand held shower head          Prior Functioning/Environment Prior Level of Function : Independent/Modified Independent;Driving             Mobility Comments: Ind no AD ADLs Comments: Independent        OT Problem List: Cardiopulmonary status limiting activity      OT Treatment/Interventions:      OT Goals(Current goals can be found in the care plan section) Acute Rehab OT Goals Patient Stated Goal: get better and be able to do things with my kids OT Goal Formulation: With patient  OT Frequency:      Co-evaluation              AM-PAC OT "6 Clicks" Daily Activity     Outcome Measure Help from another person eating meals?: None Help from another person taking care of personal grooming?: None Help from another person toileting, which includes using toliet, bedpan, or urinal?: None Help from another person bathing (including washing, rinsing, drying)?: None Help from another person to put on and taking off regular upper body clothing?: None Help from another person to put on and taking off regular lower body clothing?: None 6 Click Score: 24   End of Session Nurse Communication: Mobility status  Activity Tolerance: Patient tolerated treatment well Patient left: in bed;with call bell/phone within reach  OT Visit Diagnosis: Other (comment) (decreased cardiopulmonary endurance)                Time: 1308-6578 OT Time Calculation (min): 17 min Charges:  OT General Charges $OT Visit: 1 Visit OT Evaluation $OT Eval Low Complexity: 1  Low  Tara Bradshaw, OTR/L [facility] Acute Rehabilitation Office: [phone]   Tara Bradshaw 11/11/2022, 10:26 AM

## 2022-11-13 LAB — LEGIONELLA PNEUMOPHILA SEROGP 1 UR AG: L. pneumophila Serogp 1 Ur Ag: NEGATIVE

## 2022-11-14 ENCOUNTER — Telehealth: Payer: Self-pay | Admitting: Student

## 2022-11-14 NOTE — Progress Notes (Incomplete)
Advanced Heart Failure Clinic Note  HF Cardiologist: Dr. Gala Romney   HPI: Tara Bradshaw is a 37 y.o. female w/ nonischemic cardiomyopathy s/p SICD, HTN and systolic heart failure diagnosed in 06/26/21 w/ LVEF 20-25% , and s/p barostim 10/23.  Admitted in 3/20 with acute systolic HF with EF 20-25% in setting of recent viral illness and 57-month post-partum status. COVID testing negative.   ZioPatch 8/20 NSR. Occasional PVCs (3.5%) 9-beat run NSVT    Had return visit w/ Dr. Gala Romney 01/15/19. Endorsed fatigue after starting carvedilol.    R/LHC on 01/22/19 which showed severe NICM EF 20%, Normal coronaries, Well compensated hemodynamics with high cardiac output.    CPX 12/21 FVC 2.68 (93%)      FEV1 2.27 (93%)        FEV1/FVC 85 (100%)        MVV 84 (84%)    Resting HR: 70 Standing HR: 70 Peak HR: 135   (73% age predicted max HR)   BP rest: 148/94 Standing BP: 126/92 BP peak: 166/84  Peak VO2: 14.9 (76% predicted peak VO2) When adjusted to the patient's ideal body weight of 127.7 lb (57.9 kg) the peak VO2 is 29.4 ml/kg (ibw)/min (91% of the ibw-adjusted predicted).  VE/VCO2 slope:  27  Peak RER: 0.95   Follow up 1/24, volume overloaded with worse NYHA III symptoms, despite 1 week of increased diuresis. Arranged for RHC which showed RA 13, PA 49/33 (41), Fick CO/CI 6.3/3, Thermo CO/CI 6.4/3, PVR 2.3, PAPi 2.5. Biventricular heart failure with elevated filling pressures. Preserved CO.   Zio (2/24) showed mostly NSR with 12.5% PVC burden, 45 runs of NSVT  Follow up 5/24, volume up and weight up, REDs 47%. Instructed to take metolazone 2.5/40 KCL x 2 days and increase torsemide to 40 bid. Anemia panel showed iron deficient and arranged for iron infusion.  Admitted 11/10/22 with multifocal pneumonia.Negative SARS2.  Treated with antibiotics. HF meds cut back due to soft BP. Metolazone and spironolactone stopped.    Today she returns for post hospital follow up. Overall feeling fine.  Denies SOB/PND/Orthopnea. Appetite ok. No fever or chills. Weight at home  pounds. Taking all medications  Cardiac Studies  - Zio (2/24): NSR avg HR 82 bpm, 45 runs NSVT (longest 8 beats), 2 runs SVT (5 beats max), 12.5% PVCs  - RHC (1/24): RA 13, PA 49/33 (41), PCWP 26, CO/CI (Fick): 6.3/3, PVR 2.3, PAPi 2.5.   - Echo (7/23): EF 25%-30%, severely dilated LV; RV mildly enlarged.   - CPX (03/17/20):  Submax test. No evidence of significant cardiopulmonary limitation despite LV dysfunction. When corrected to ideal body weight, measured pVO2 is normal. Suspect majority of limitation related to obesity.   - Echo (12/21): EF 25-30%  - Echo (10/20): EF 25-30%, (read as 30-35%)  Past Medical History:  Diagnosis Date   AICD (automatic cardioverter/defibrillator) present    Anemia    CHRONIC   CHF (congestive heart failure) (HCC)    Chronic systolic dysfunction of left ventricle    GERD (gastroesophageal reflux disease)    WITH PREGNANCY   Nonischemic cardiomyopathy (HCC)    Pneumonia    x 1   PVC's (premature ventricular contractions)    Sleep apnea    Current Outpatient Medications  Medication Sig Dispense Refill   acetaminophen (TYLENOL) 325 MG tablet Take 650 mg by mouth every 6 (six) hours as needed for mild pain or headache.     amoxicillin-clavulanate (AUGMENTIN) 500-125 MG tablet Take 1 tablet  by mouth in the morning and at bedtime for 3 days. 6 tablet 0   azithromycin (ZITHROMAX) 250 MG tablet Take 1 tablet (250 mg total) by mouth in the morning and at bedtime for 3 days. 6 tablet 0   cyclobenzaprine (FLEXERIL) 10 MG tablet Take 1 tablet (10 mg total) by mouth 3 (three) times daily as needed for muscle spasms. 10 tablet 0   dapagliflozin propanediol (FARXIGA) 10 MG TABS tablet Take 1 tablet (10 mg total) by mouth daily before breakfast. 90 tablet 3   gabapentin (NEURONTIN) 300 MG capsule Take 1 capsule (300 mg total) by mouth daily. (Patient taking differently: Take 300 mg by  mouth at bedtime.) 30 capsule 1   levonorgestrel (MIRENA) 20 MCG/24HR IUD 1 each by Intrauterine route once.     meclizine (ANTIVERT) 25 MG tablet Take 1 tablet (25 mg total) by mouth 3 (three) times daily as needed for dizziness. 30 tablet 0   metoprolol succinate (TOPROL-XL) 25 MG 24 hr tablet Take 0.5 tablets (12.5 mg total) by mouth daily. 15 tablet 0   Multiple Vitamins-Minerals (WOMENS MULTIVITAMIN PO) Take 1 tablet by mouth daily.     NON FORMULARY Pt uses a cpap nightly     potassium chloride SA (KLOR-CON M) 20 MEQ tablet Take 2 tablets (40 mEq total) by mouth daily. 60 tablet 0   sacubitril-valsartan (ENTRESTO) 97-103 MG Take 1 tablet by mouth 2 (two) times daily. 180 tablet 3   Semaglutide,0.25 or 0.5MG /DOS, 2 MG/3ML SOPN Inject 0.25mg  once a week for 4 weeks then increase to 0.5mg  once a week (Patient taking differently: Inject 0.5 mg into the skin once a week. Thursday - Ozempic) 3 mL 1   torsemide (DEMADEX) 10 MG tablet Take 3 tablets (30 mg total) by mouth 2 (two) times daily. 90 tablet 0   No current facility-administered medications for this visit.   No Known Allergies  Social History   Socioeconomic History   Marital status: Widowed    Spouse name: Not on file   Number of children: Not on file   Years of education:  14   Highest education level: Not on file  Occupational History   Occupation: SECURITY OFFICER    Employer: [employer]  Tobacco Use   Smoking status: Never    Passive exposure: Never   Smokeless tobacco: Never  Vaping Use   Vaping status: Never Used  Substance and Sexual Activity   Alcohol use: No   Drug use: No   Sexual activity: Yes    Partners: Male    Birth control/protection: None  Other Topics Concern   Not on file  Social History Narrative   Lives in [place] with husband and children (6,1)   Unemployed    Previously worked in Office manager.   Social Determinants of Health   Financial Resource Strain: Low Risk   (12/05/2017)   Overall Financial Resource Strain (CARDIA)    Difficulty of Paying Living Expenses: Not hard at all  Food Insecurity: No Food Insecurity (12/05/2017)   Hunger Vital Sign    Worried About Running Out of Food in the Last Year: Never true    Ran Out of Food in the Last Year: Never true  Transportation Needs: No Transportation Needs (12/05/2017)   PRAPARE - Administrator, Civil Service (Medical): No    Lack of Transportation (Non-Medical): No  Physical Activity: Inactive (12/05/2017)   Exercise Vital Sign    Days of Exercise per Week: 0 days  Minutes of Exercise per Session: 0 min  Stress: Not on file  Social Connections: Not on file  Intimate Partner Violence: Not At Risk (12/05/2017)   Humiliation, Afraid, Rape, and Kick questionnaire    Fear of Current or Ex-Partner: No    Emotionally Abused: No    Physically Abused: No    Sexually Abused: No   Family History  Problem Relation Age of Onset   Epilepsy Mother    Asthma Mother    Cancer Paternal Grandmother        BREAST   Cancer Maternal Aunt 44       BREAST   Hypertension Maternal Grandmother    There were no vitals taken for this visit.  Wt Readings from Last 3 Encounters:  11/11/22 117.3 kg (258 lb 9.6 oz)  09/24/22 117.5 kg (259 lb)  09/05/22 121.1 kg (267 lb)   Physical exam:  General:  Well appearing. No resp difficulty HEENT: normal Neck: supple. no JVD. Carotids 2+ bilat; no bruits. No lymphadenopathy or thryomegaly appreciated. Cor: PMI nondisplaced. Regular rate & rhythm. No rubs, gallops or murmurs. Lungs: clear Abdomen: soft, nontender, nondistended. No hepatosplenomegaly. No bruits or masses. Good bowel sounds. Extremities: no cyanosis, clubbing, rash, edema Neuro: alert & orientedx3, cranial nerves grossly intact. moves all 4 extremities w/o difficulty. Affect pleasant  Device interrogation  ASSESSMENT & PLAN: Chronic HFrEF:  - Diagnosed 3/20. Echo LVEF 25% with moderate RV  dysfunction - NICM. Either viral (had URI in week prior to admission and respiratory panel + rhinovirus) or post-partum or PVC cardiomyopathy.  - LHC 10/20 showed normal cors. RHC showed well compensated hemodynamics with high cardiac output. No evidence of intracardiac shunting.  - Echo 10/20 EF 25-30% - Echo 12/21 EF 25-30% - Echo 10/09/21 EF 25-30% Personally reviewed - CPX 1/22 Peak VO2: 14.9 (76% predicted peak VO2) adjusted to ibw pVO2 is 29.4 ml/kg (ibw)/min (91% of the ibw-adjusted predicted). VE/VCO2 slope:  27 - S/p barostim 10/23 - RHC (1/24): RA 13, PA 49/33 (41), Fick CO/CI 6.3/3, Thermo CO/CI 6.4/3, PVR 2.3, PAPi 2.5. Biventricular heart failure with elevated filling pressures. Preserved CO.  - Zio 2/24 NSR avg HR 82 bpm, 45 runs NSVT (longest 8 beats), 2 runs SVT (5 beats max), 12.5% PVCs - NYHA - Volume status  - Continue torsemide  - Continue Farxiga 10 mg daily. -  Continue Entresto 97/103 mg bid - Continue Toprol XL 12.5 mg at bedtime   2. HTN - 3. PVCs:  - Zio (2/24) showed 12.5 % PVC burden - Amio stopped 7/23 as PVC suppression did not improve EF and favor not exposing her to long-term side effects - Update echo  4. OSA - Moderate on sleep study (4/20) - Compliant with CPAP  5. Obesity - There is no height or weight on file to calculate BMI. - Recently started back on GLP1   Maribelle Hopple NP-C   11/14/22 3:19 PM   Advanced Heart Failure Clinic [facility] [address] Heart and Vascular [place] [address] [phone] (office) [phone] (fax)

## 2022-11-14 NOTE — Telephone Encounter (Signed)
Legionella and Strep urinary antigens negative. No changes to treatment plan.

## 2022-11-15 ENCOUNTER — Encounter (HOSPITAL_COMMUNITY): Payer: Self-pay

## 2022-11-15 ENCOUNTER — Ambulatory Visit (HOSPITAL_COMMUNITY)
Admission: RE | Admit: 2022-11-15 | Discharge: 2022-11-15 | Disposition: A | Discharge status: Home / Self Care | Payer: 59 | Source: Ambulatory Visit | Attending: Internal Medicine | Admitting: Internal Medicine

## 2022-11-15 VITALS — BP 100/60 | HR 60 | Wt 257.0 lb

## 2022-11-15 DIAGNOSIS — I5022 Chronic systolic (congestive) heart failure: Secondary | ICD-10-CM | POA: Diagnosis not present

## 2022-11-15 DIAGNOSIS — E669 Obesity, unspecified: Secondary | ICD-10-CM | POA: Diagnosis not present

## 2022-11-15 DIAGNOSIS — I5082 Biventricular heart failure: Secondary | ICD-10-CM | POA: Diagnosis not present

## 2022-11-15 DIAGNOSIS — G4733 Obstructive sleep apnea (adult) (pediatric): Secondary | ICD-10-CM | POA: Diagnosis not present

## 2022-11-15 DIAGNOSIS — Z7984 Long term (current) use of oral hypoglycemic drugs: Secondary | ICD-10-CM | POA: Insufficient documentation

## 2022-11-15 DIAGNOSIS — I10 Essential (primary) hypertension: Secondary | ICD-10-CM

## 2022-11-15 DIAGNOSIS — R42 Dizziness and giddiness: Secondary | ICD-10-CM | POA: Diagnosis not present

## 2022-11-15 DIAGNOSIS — I428 Other cardiomyopathies: Secondary | ICD-10-CM | POA: Insufficient documentation

## 2022-11-15 DIAGNOSIS — R0602 Shortness of breath: Secondary | ICD-10-CM | POA: Diagnosis not present

## 2022-11-15 DIAGNOSIS — J189 Pneumonia, unspecified organism: Secondary | ICD-10-CM | POA: Insufficient documentation

## 2022-11-15 DIAGNOSIS — Z6841 Body Mass Index (BMI) 40.0 and over, adult: Secondary | ICD-10-CM | POA: Insufficient documentation

## 2022-11-15 DIAGNOSIS — Z79899 Other long term (current) drug therapy: Secondary | ICD-10-CM | POA: Insufficient documentation

## 2022-11-15 DIAGNOSIS — I110 Hypertensive heart disease with heart failure: Secondary | ICD-10-CM | POA: Diagnosis not present

## 2022-11-15 DIAGNOSIS — I493 Ventricular premature depolarization: Secondary | ICD-10-CM | POA: Diagnosis not present

## 2022-11-15 LAB — BASIC METABOLIC PANEL
Anion gap: 8 (ref 5–15)
BUN: 9 mg/dL (ref 6–20)
CO2: 23 mmol/L (ref 22–32)
Calcium: 9.1 mg/dL (ref 8.9–10.3)
Chloride: 105 mmol/L (ref 98–111)
Creatinine, Ser: 0.79 mg/dL (ref 0.44–1.00)
GFR, Estimated: >60 mL/min (ref >60)
Glucose, Bld: 99 mg/dL (ref 70–99)
Potassium: 4.4 mmol/L (ref 3.5–5.1)
Sodium: 136 mmol/L (ref 135–145)

## 2022-11-15 LAB — BRAIN NATRIURETIC PEPTIDE: B Natriuretic Peptide: 139.4 pg/mL — ABNORMAL HIGH (ref 0.0–100.0)

## 2022-11-15 NOTE — Patient Instructions (Addendum)
Good to see you today!   HOLD torsemide and Potassium for today Restart Torsemide and potassium tomorrow   Labs done today, your results will be available in MyChart, we will contact you for abnormal readings.  Your physician recommends that you schedule a follow-up appointment in: 3 weeks as scheduled   If you have any questions or concerns before your next appointment please send [place] a message through [place] or call our office at [phone].    TO LEAVE A MESSAGE FOR THE NURSE SELECT OPTION 2, PLEASE LEAVE A MESSAGE INCLUDING: YOUR NAME DATE OF BIRTH CALL BACK NUMBER REASON FOR CALL**this is important as we prioritize the call backs  YOU WILL RECEIVE A CALL BACK THE SAME DAY AS LONG AS YOU CALL BEFORE 4:00 PM  At the Advanced Heart Failure Clinic, you and your health needs are our priority. As part of our continuing mission to provide you with exceptional heart care, we have created designated Provider Care Teams. These Care Teams include your primary Cardiologist (physician) and Advanced Practice Providers (APPs- Physician Assistants and Nurse Practitioners) who all work together to provide you with the care you need, when you need it.   You may see any of the following providers on your designated Care Team at your next follow up: Dr Arvilla Meres Dr Marca Ancona Dr. Marcos Eke, NP Robbie Lis, [place] [facility] [place], [place] Brynda Peon, NP Karle Plumber, PharmD   Please be sure to bring in all your medications bottles to every appointment.    Thank you for choosing [facility]

## 2022-11-15 NOTE — Progress Notes (Signed)
ReDS Vest / Clip - 11/15/22 1000       ReDS Vest / Clip   Station Marker A    Ruler Value 31    ReDS Value Range High volume overload    ReDS Actual Value 45

## 2022-11-27 ENCOUNTER — Encounter: Payer: 59 | Admitting: Student

## 2022-11-30 ENCOUNTER — Ambulatory Visit (INDEPENDENT_AMBULATORY_CARE_PROVIDER_SITE_OTHER): Payer: 59

## 2022-11-30 DIAGNOSIS — I428 Other cardiomyopathies: Secondary | ICD-10-CM | POA: Diagnosis not present

## 2022-11-30 LAB — CUP PACEART REMOTE DEVICE CHECK
Battery Remaining Longevity: 71 mo
Battery Remaining Percentage: 65 %
Battery Voltage: 2.98 V
Brady Statistic RV Percent Paced: 1 %
Date Time Interrogation Session: 20240822220815
HighPow Impedance: 63 Ohm
Implantable Lead Connection Status: 753985
Implantable Lead Implant Date: 20210107
Implantable Lead Location: 753860
Implantable Pulse Generator Implant Date: 20201112
Lead Channel Impedance Value: 330 Ohm
Lead Channel Pacing Threshold Amplitude: 1.75 V
Lead Channel Pacing Threshold Pulse Width: 0.5 ms
Lead Channel Sensing Intrinsic Amplitude: 9.7 mV
Lead Channel Setting Pacing Amplitude: 3.5 V
Lead Channel Setting Pacing Pulse Width: 0.5 ms
Lead Channel Setting Sensing Sensitivity: 0.5 mV
Pulse Gen Serial Number: 111012702
Zone Setting Status: 755011

## 2022-12-05 NOTE — Progress Notes (Signed)
Remote ICD transmission.   

## 2022-12-13 ENCOUNTER — Encounter (HOSPITAL_COMMUNITY): Payer: 59

## 2022-12-13 NOTE — Progress Notes (Signed)
No ICM remote transmission received for 12/11/2022 and next ICM transmission scheduled for 12/18/2022.

## 2022-12-19 ENCOUNTER — Other Ambulatory Visit: Payer: Self-pay | Admitting: Student

## 2022-12-19 NOTE — Progress Notes (Signed)
No ICM remote transmission received for 12/17/2022 and next ICM transmission scheduled for 12/31/2022.

## 2022-12-22 DIAGNOSIS — G4733 Obstructive sleep apnea (adult) (pediatric): Secondary | ICD-10-CM | POA: Diagnosis not present

## 2022-12-31 ENCOUNTER — Ambulatory Visit: Payer: 59 | Attending: Cardiology

## 2022-12-31 DIAGNOSIS — I5022 Chronic systolic (congestive) heart failure: Secondary | ICD-10-CM

## 2022-12-31 DIAGNOSIS — Z95810 Presence of automatic (implantable) cardiac defibrillator: Secondary | ICD-10-CM | POA: Diagnosis not present

## 2023-01-02 ENCOUNTER — Telehealth: Payer: Self-pay

## 2023-01-02 NOTE — Telephone Encounter (Signed)
Remote ICM transmission received.  Attempted call to patient regarding ICM remote transmission and no answer.  

## 2023-01-02 NOTE — Progress Notes (Signed)
EPIC Encounter for ICM Monitoring  Patient Name: Tara Bradshaw is a 37 y.o. female Date: 01/02/2023 Primary Care Physican: Patient, No Pcp Per Primary Cardiologist: Bensimhon Electrophysiologist: Lalla Brothers 10/23/2021 Office Weight: 263 lbs   01/22/2022 Weight: 255 lbs 05/03/2022 Weight: 253 lbs 05/07/2022 Weight: 255 lbs 08/27/2022 Weight: 263 lbs (baseline 253 lbs)                  Attempted call to patient and unable to reach.   Transmission reviewed.      Corvue thoracic impedance suggesting normal fluid levels with the exception of possible fluid accumulation from 8/10-8/21 and 9/16-9/19.       Prescribed: Torsemide 20 mg take 3 tablet(s) (60 mg total) by mouth twice a day.   Potassium 20 mEq take 2 tablet(s) (40 mg total) by mouth daily.   Labs: 11/15/2022 Creatinine 0.79, BUN 9,   Potassium 4.4, Sodium 136, GFR >60  11/11/2022 Creatinine 0.84, BUN 12, Potassium 3.3, Sodium 136, GFR >60  11/10/2022 Creatinine 0.73, BUN 11, Potassium 3.7, Sodium 137, GFR >60  10/09/2022 Creatinine 0.62, BUN <5, Potassium 3.7, Sodium 137, GFR >60 09/24/2022 Creatinine 0.64, BUN   9, Potassium 3.8, Sodium 138, GFR >60 09/12/2022 Creatinine 0.71, BUN   5, Potassium 3.4, Sodium 135, GFR >60 A complete set of results can be found in Results Review.   Recommendations:  Unable to reach.     Follow-up plan: ICM clinic phone appointment on 02/04/2023.   91 day device clinic remote transmission 03/01/2023.       EP/Cardiology Office Visits:   Recall 12/23/2022 with Dr Gala Romney.  Recall 10/18/2022 with Francis Dowse, PA.   Copy of ICM check sent to Dr. Lalla Brothers.   3 month ICM trend: 12/30/2022.    12-14 Month ICM trend:     Karie Soda, RN 01/02/2023 1:28 PM

## 2023-02-04 ENCOUNTER — Ambulatory Visit: Payer: 59 | Attending: Cardiology

## 2023-02-04 DIAGNOSIS — Z95810 Presence of automatic (implantable) cardiac defibrillator: Secondary | ICD-10-CM | POA: Diagnosis not present

## 2023-02-04 DIAGNOSIS — I5022 Chronic systolic (congestive) heart failure: Secondary | ICD-10-CM | POA: Diagnosis not present

## 2023-02-08 NOTE — Progress Notes (Signed)
EPIC Encounter for ICM Monitoring  Patient Name: Tara Bradshaw is a 37 y.o. female Date: 02/08/2023 Primary Care Physican: Patient, No Pcp Per Primary Cardiologist: Bensimhon Electrophysiologist: Lalla Brothers 10/23/2021 Office Weight: 263 lbs   01/22/2022 Weight: 255 lbs 05/03/2022 Weight: 253 lbs 05/07/2022 Weight: 255 lbs 08/27/2022 Weight: 263 lbs (baseline 253 lbs)                  Transmission reviewed.      Corvue thoracic impedance suggesting normal fluid levels with the exception of possible fluid accumulation from 10/7-10/16.       Prescribed: Torsemide 10 mg take 3 tablet(s) (30 mg total) by mouth twice a day.   Potassium 20 mEq take 2 tablet(s) (40 mg total) by mouth daily.   Labs: 11/15/2022 Creatinine 0.79, BUN 9,   Potassium 4.4, Sodium 136, GFR >60  11/11/2022 Creatinine 0.84, BUN 12, Potassium 3.3, Sodium 136, GFR >60  11/10/2022 Creatinine 0.73, BUN 11, Potassium 3.7, Sodium 137, GFR >60  10/09/2022 Creatinine 0.62, BUN <5, Potassium 3.7, Sodium 137, GFR >60 09/24/2022 Creatinine 0.64, BUN   9, Potassium 3.8, Sodium 138, GFR >60 09/12/2022 Creatinine 0.71, BUN   5, Potassium 3.4, Sodium 135, GFR >60 A complete set of results can be found in Results Review.   Recommendations:  No changes   Follow-up plan: ICM clinic phone appointment on 03/11/2023.   91 day device clinic remote transmission 03/01/2023.       EP/Cardiology Office Visits:   Recall 12/23/2022 with Dr Gala Romney.  Message sent to EP scheduler 11/1 to contact patient for overdue EP visit.   Recall 10/18/2022 with Francis Dowse, PA.   Copy of ICM check sent to Dr. Lalla Brothers.   3 month ICM trend: 02/03/2023.    12-14 Month ICM trend:     Karie Soda, RN 02/08/2023 7:23 AM

## 2023-03-01 ENCOUNTER — Ambulatory Visit (INDEPENDENT_AMBULATORY_CARE_PROVIDER_SITE_OTHER): Payer: 59

## 2023-03-01 ENCOUNTER — Ambulatory Visit: Payer: 59 | Attending: Pulmonary Disease | Admitting: Pulmonary Disease

## 2023-03-01 ENCOUNTER — Encounter: Payer: Self-pay | Admitting: Pulmonary Disease

## 2023-03-01 VITALS — BP 112/68 | HR 75 | Ht 64.0 in | Wt 261.0 lb

## 2023-03-01 DIAGNOSIS — Z95810 Presence of automatic (implantable) cardiac defibrillator: Secondary | ICD-10-CM | POA: Diagnosis not present

## 2023-03-01 DIAGNOSIS — I5022 Chronic systolic (congestive) heart failure: Secondary | ICD-10-CM

## 2023-03-01 DIAGNOSIS — I428 Other cardiomyopathies: Secondary | ICD-10-CM | POA: Diagnosis not present

## 2023-03-01 DIAGNOSIS — G4733 Obstructive sleep apnea (adult) (pediatric): Secondary | ICD-10-CM

## 2023-03-01 DIAGNOSIS — I493 Ventricular premature depolarization: Secondary | ICD-10-CM

## 2023-03-01 LAB — CUP PACEART REMOTE DEVICE CHECK
Battery Remaining Longevity: 68 mo
Battery Remaining Percentage: 63 %
Battery Voltage: 2.98 V
Brady Statistic RV Percent Paced: 1 %
Date Time Interrogation Session: 20241121212435
HighPow Impedance: 59 Ohm
Implantable Lead Connection Status: 753985
Implantable Lead Implant Date: 20210107
Implantable Lead Location: 753860
Implantable Pulse Generator Implant Date: 20201112
Lead Channel Impedance Value: 310 Ohm
Lead Channel Pacing Threshold Amplitude: 1.75 V
Lead Channel Pacing Threshold Pulse Width: 0.5 ms
Lead Channel Sensing Intrinsic Amplitude: 11.4 mV
Lead Channel Setting Pacing Amplitude: 3.5 V
Lead Channel Setting Pacing Pulse Width: 0.5 ms
Lead Channel Setting Sensing Sensitivity: 0.5 mV
Pulse Gen Serial Number: 111012702
Zone Setting Status: 755011

## 2023-03-01 LAB — CUP PACEART INCLINIC DEVICE CHECK
Battery Remaining Longevity: 76 mo
Brady Statistic RV Percent Paced: 0 %
Date Time Interrogation Session: 20241122124124
HighPow Impedance: 65.25 Ohm
Implantable Lead Connection Status: 753985
Implantable Lead Implant Date: 20210107
Implantable Lead Location: 753860
Implantable Pulse Generator Implant Date: 20201112
Lead Channel Impedance Value: 325 Ohm
Lead Channel Pacing Threshold Amplitude: 1.75 V
Lead Channel Pacing Threshold Amplitude: 1.75 V
Lead Channel Pacing Threshold Pulse Width: 0.5 ms
Lead Channel Pacing Threshold Pulse Width: 0.5 ms
Lead Channel Sensing Intrinsic Amplitude: 11.9 mV
Lead Channel Setting Pacing Amplitude: 3.5 V
Lead Channel Setting Pacing Pulse Width: 0.5 ms
Lead Channel Setting Sensing Sensitivity: 0.5 mV
Pulse Gen Serial Number: 111012702
Zone Setting Status: 755011

## 2023-03-01 NOTE — Patient Instructions (Signed)
Medication Instructions:   *If you need a refill on your cardiac medications before your next appointment, please call your pharmacy*   Lab Work:  If you have labs (blood work) drawn today and your tests are completely normal, you will receive your results only by: MyChart Message (if you have MyChart) OR A paper copy in the mail If you have any lab test that is abnormal or we need to change your treatment, we will call you to review the results.   Testing/Procedures:    Follow-Up: At [facility], you and your health needs are our priority.  As part of our continuing mission to provide you with exceptional heart care, we have created designated Provider Care Teams.  These Care Teams include your primary Cardiologist (physician) and Advanced Practice Providers (APPs -  Physician Assistants and Nurse Practitioners) who all work together to provide you with the care you need, when you need it.  We recommend signing up for the patient portal called "MyChart".  Sign up information is provided on this After Visit Summary.  MyChart is used to connect with patients for Virtual Visits (Telemedicine).  Patients are able to view lab/test results, encounter notes, upcoming appointments, etc.  Non-urgent messages can be sent to your provider as well.   To learn more about what you can do with MyChart, go to [email or web address].    Your next appointment:   1 year(s)  Provider:   Dr Lalla Brothers     Other Instructions

## 2023-03-01 NOTE — Progress Notes (Addendum)
Electrophysiology Office Note:   Date:  03/01/2023  ID:  Tara Bradshaw, DOB April 13, 1985, MRN [id number]  Primary Cardiologist: None Electrophysiologist: Lanier Prude, MD       History of Present Illness:   Tara Bradshaw is a 37 y.o. female with h/o ischemic cardiomyopathy s/p ICD, barostim seen today for routine electrophysiology followup.   Initial acute sCHF event 7 months post partum & post viral illness in 06/2018.   Admit from 8/3-11/11/22 for multifocal PNA. She was discharged on Augmentin & Azithromycin.  Since last being seen in our clinic the patient reports she has been doing well. She states she was taken off Metoprolol due to low blood pressures (but I can not find this in the chart and it remains on her med list).    She denies chest pain, palpitations, dyspnea, PND, orthopnea, nausea, vomiting, dizziness, syncope, edema, weight gain, or early satiety.   Review of systems complete and found to be negative unless listed in HPI.   EP Information / Studies Reviewed:    EKG is not ordered today. EKG from 11/15/22 reviewed which showed NSR 72 bpm, with PVC's      ICD Interrogation-  reviewed in detail today,  See PACEART report.  Device History: Abbott S-ICD ICD implanted 04/16/2019 for NICM History of appropriate therapy: Yes, ATP with aborted shocks.  History of AAD therapy: Yes; previously tolerated amiodarone but did not suppress PVC's    Studies:  R/LHC 01/2019 > severe NICM EF 20%, Normal coronaries, Well compensated hemodynamics with high cardiac output.  ECHO 10/2021 > LVEF 25-30%, LV demonstrates global hypokinesis, LV severely dilated, indeterminate diastolic parameters, LA moderately dilated RHC 04/2022 > biventricular HF with elevated filling pressures, preserved CO, frequent PVC's LTM 05/2022 > SR, 45 runs of NSVT with the fastest interval lasting 4 beats with max rate of 207 bpm, longest lasting 8 beats with an avg rate of 121 bpm.  Two runs of SVT, frequent  PVC's 12.5% with 3 morphologies  Arrhythmia / AAD PVC's > previously on amiodarone, stopped 10/2021 as did not improved her burden & avoidance of long term side effects  Device: ICD as above Barostim 01/2022       Physical Exam:   VS:  BP 112/68   Pulse 75   Ht 5\' 4"  (1.626 m)   Wt 261 lb (118.4 kg)   SpO2 99%   BMI 44.80 kg/m    Wt Readings from Last 3 Encounters:  03/01/23 261 lb (118.4 kg)  11/15/22 257 lb (116.6 kg)  11/11/22 258 lb 9.6 oz (117.3 kg)     GEN: Well nourished, well developed in no acute distress NECK: No JVD; No carotid bruits CARDIAC: Regular rate and rhythm, no murmurs, rubs, gallops RESPIRATORY:  Clear to auscultation without rales, wheezing or rhonchi  ABDOMEN: Soft, non-tender, non-distended EXTREMITIES:  No edema; No deformity   ASSESSMENT AND PLAN:    NICM s/p Abbott single chamber ICD  -euvolemic today, no evidence of fluid accumulation on ICD -Stable on an appropriate medical regimen -Normal ICD function -See Pace Art report -No changes today -GDMT per [facility]  -Barostim in place, not checked this visit > f/u in 3 months for check   PVC's  Zio 05/2022 with 12.5% burden  -continue metoprolol   Hypertension  -well controlled on current regimen    OSA  Moderate on PSG 07/2018 -encouraged CPAP compliance  Disposition:   Follow up with Dr. Lalla Brothers in 12 months   Signed, Merry Proud  Veleta Miners, MSN, APRN, NP-C, AGACNP-BC [facility] - Electrophysiology  03/01/2023, 12:33 PM

## 2023-03-04 ENCOUNTER — Other Ambulatory Visit: Payer: Self-pay

## 2023-03-04 MED ORDER — POTASSIUM CHLORIDE CRYS ER 20 MEQ PO TBCR: 40.0000 meq | EXTENDED_RELEASE_TABLET | Freq: Every day | ORAL | 3 refills | Status: DC

## 2023-03-04 MED ORDER — TORSEMIDE 10 MG PO TABS: 30.0000 mg | ORAL_TABLET | Freq: Two times a day (BID) | ORAL | 3 refills | Status: DC

## 2023-03-04 MED ORDER — DAPAGLIFLOZIN PROPANEDIOL 10 MG PO TABS: 10.0000 mg | ORAL_TABLET | Freq: Every day | ORAL | 3 refills | Status: DC

## 2023-03-06 ENCOUNTER — Other Ambulatory Visit: Payer: Self-pay

## 2023-03-06 ENCOUNTER — Encounter: Payer: Self-pay | Admitting: Pharmacist

## 2023-03-06 ENCOUNTER — Other Ambulatory Visit: Payer: Self-pay | Admitting: Pharmacist

## 2023-03-06 MED ORDER — ENTRESTO 97-103 MG PO TABS: 1.0000 | ORAL_TABLET | Freq: Two times a day (BID) | ORAL | 3 refills | Status: DC

## 2023-03-06 MED ORDER — SEMAGLUTIDE(0.25 OR 0.5MG/DOS) 2 MG/3ML ~~LOC~~ SOPN: PEN_INJECTOR | SUBCUTANEOUS | 1 refills | Status: DC

## 2023-03-11 ENCOUNTER — Ambulatory Visit: Payer: 59 | Attending: Cardiology

## 2023-03-11 DIAGNOSIS — I5022 Chronic systolic (congestive) heart failure: Secondary | ICD-10-CM | POA: Diagnosis not present

## 2023-03-11 DIAGNOSIS — Z95810 Presence of automatic (implantable) cardiac defibrillator: Secondary | ICD-10-CM | POA: Diagnosis not present

## 2023-03-13 NOTE — Progress Notes (Signed)
EPIC Encounter for ICM Monitoring  Patient Name: Tara Bradshaw is a 37 y.o. female Date: 03/13/2023 Primary Care Physican: Patient, No Pcp Per Primary Cardiologist: Bensimhon Electrophysiologist: Lalla Brothers 10/23/2021 Office Weight: 263 lbs   01/22/2022 Weight: 255 lbs 05/03/2022 Weight: 253 lbs 05/07/2022 Weight: 255 lbs 08/27/2022 Weight: 263 lbs (baseline 253 lbs)                  Attempted call to patient and unable to reach.  Left detailed message per DPR regarding transmission.  Transmission reviewed.      Corvue thoracic impedance suggesting normal fluid levels with the exception of possible fluid accumulation from 11/23-11/30.       Prescribed: Torsemide 10 mg take 3 tablet(s) (30 mg total) by mouth twice a day.   Potassium 20 mEq take 2 tablet(s) (40 mg total) by mouth daily.   Labs: 11/15/2022 Creatinine 0.79, BUN 9,   Potassium 4.4, Sodium 136, GFR >60  11/11/2022 Creatinine 0.84, BUN 12, Potassium 3.3, Sodium 136, GFR >60  11/10/2022 Creatinine 0.73, BUN 11, Potassium 3.7, Sodium 137, GFR >60  10/09/2022 Creatinine 0.62, BUN <5, Potassium 3.7, Sodium 137, GFR >60 09/24/2022 Creatinine 0.64, BUN   9, Potassium 3.8, Sodium 138, GFR >60 09/12/2022 Creatinine 0.71, BUN   5, Potassium 3.4, Sodium 135, GFR >60 A complete set of results can be found in Results Review.   Recommendations:  Left voice mail with ICM number and encouraged to call if experiencing any fluid symptoms.   Follow-up plan: ICM clinic phone appointment on 04/15/2023.   91 day device clinic remote transmission 05/31/2023.       EP/Cardiology Office Visits:   Recall 12/23/2022 with Dr Gala Romney.    Recall 05/01/2023 with Dr Lalla Brothers.   Copy of ICM check sent to Dr. Lalla Brothers.    3 month ICM trend: 03/11/2023.    12-14 Month ICM trend:     Karie Soda, RN 03/13/2023 10:58 AM

## 2023-03-14 ENCOUNTER — Encounter: Payer: Self-pay | Admitting: Cardiology

## 2023-03-21 NOTE — Progress Notes (Signed)
Remote ICD transmission.   

## 2023-03-22 ENCOUNTER — Other Ambulatory Visit (HOSPITAL_COMMUNITY): Payer: Self-pay

## 2023-03-22 DIAGNOSIS — I5022 Chronic systolic (congestive) heart failure: Secondary | ICD-10-CM

## 2023-03-22 MED ORDER — MECLIZINE HCL 25 MG PO TABS: 25.0000 mg | ORAL_TABLET | Freq: Three times a day (TID) | ORAL | 0 refills | Status: AC | PRN

## 2023-04-04 DIAGNOSIS — G4733 Obstructive sleep apnea (adult) (pediatric): Secondary | ICD-10-CM | POA: Diagnosis not present

## 2023-04-09 ENCOUNTER — Other Ambulatory Visit: Payer: Self-pay | Admitting: Internal Medicine

## 2023-04-11 MED ORDER — SEMAGLUTIDE(0.25 OR 0.5MG/DOS) 2 MG/3ML ~~LOC~~ SOPN: 0.5000 mg | PEN_INJECTOR | SUBCUTANEOUS | 0 refills | Status: DC

## 2023-04-11 NOTE — Telephone Encounter (Signed)
 Spoke to patient, reports she has been taking 0.5 mg dose and not ready to move on to 1 mg dose yet. Will send prescription for 0.5 mg for now and f/u via phone in 4 weeks for dose titration.

## 2023-04-11 NOTE — Addendum Note (Signed)
 Addended by: Tylene Fantasia on: 04/11/2023 01:09 PM   Modules accepted: Orders

## 2023-04-15 ENCOUNTER — Ambulatory Visit: Payer: 59 | Attending: Cardiology

## 2023-04-15 DIAGNOSIS — I5022 Chronic systolic (congestive) heart failure: Secondary | ICD-10-CM | POA: Diagnosis not present

## 2023-04-15 DIAGNOSIS — Z95810 Presence of automatic (implantable) cardiac defibrillator: Secondary | ICD-10-CM | POA: Diagnosis not present

## 2023-04-17 NOTE — Progress Notes (Signed)
 EPIC Encounter for ICM Monitoring  Patient Name: Tara Bradshaw is a 38 y.o. female Date: 04/17/2023 Primary Care Physican: Patient, No Pcp Per Primary Cardiologist: Bensimhon Electrophysiologist: Cindie 10/23/2021 Office Weight: 263 lbs   01/22/2022 Weight: 255 lbs 05/03/2022 Weight: 253 lbs 05/07/2022 Weight: 255 lbs 08/27/2022 Weight: 263 lbs (baseline 253 lbs)                  Transmission reviewed.      Corvue thoracic impedance suggesting normal fluid levels with the exception of possible fluid accumulation from 12/26-12/31.       Prescribed: Torsemide  10 mg take 3 tablet(s) (30 mg total) by mouth twice a day.   Potassium 20 mEq take 2 tablet(s) (40 mg total) by mouth daily.   Labs: 11/15/2022 Creatinine 0.79, BUN 9,   Potassium 4.4, Sodium 136, GFR >60  11/11/2022 Creatinine 0.84, BUN 12, Potassium 3.3, Sodium 136, GFR >60  11/10/2022 Creatinine 0.73, BUN 11, Potassium 3.7, Sodium 137, GFR >60  10/09/2022 Creatinine 0.62, BUN <5, Potassium 3.7, Sodium 137, GFR >60 09/24/2022 Creatinine 0.64, BUN   9, Potassium 3.8, Sodium 138, GFR >60 09/12/2022 Creatinine 0.71, BUN   5, Potassium 3.4, Sodium 135, GFR >60 A complete set of results can be found in Results Review.   Recommendations:  No changes.   Follow-up plan: ICM clinic phone appointment on 05/20/2023.   91 day device clinic remote transmission 05/31/2023.       EP/Cardiology Office Visits:   Recall 12/23/2022 with Dr Cherrie.    Recall 05/30/2023 with Jodie Passey, PA.  Recall 02/29/2024 with Dr Cindie.   Copy of ICM check sent to Dr. Cindie.    3 month ICM trend: 04/14/2023.    12-14 Month ICM trend:     Mitzie GORMAN Garner, RN 04/17/2023 3:04 PM

## 2023-04-23 ENCOUNTER — Other Ambulatory Visit (HOSPITAL_COMMUNITY): Payer: Self-pay

## 2023-04-23 ENCOUNTER — Telehealth: Payer: Self-pay | Admitting: Pharmacy Technician

## 2023-04-23 NOTE — Telephone Encounter (Signed)
 Pharmacy Patient Advocate Encounter   Received notification from CoverMyMeds that prior authorization for Ozempic  (0.25 or 0.5 MG/DOSE) 2MG /3ML pen-injectors is required/requested.   Insurance verification completed.   The patient is insured through [facility] .   Per test claim: PA required; PA submitted to above mentioned insurance via CoverMyMeds Key/confirmation #/EOC MUR[place] [employer]IL Status is pending

## 2023-04-24 NOTE — Telephone Encounter (Signed)
 Pharmacy Patient Advocate Encounter  Received notification from [employer] that Prior Authorization for Ozempic  (0.25 or 0.5 MG/DOSE) 2MG /3ML pen-injectors has been DENIED.  See denial reason below. No denial letter attached in CMM. Will attach denial letter to Media tab once received.   PA #/Case ID/Reference #: [id number]

## 2023-05-24 NOTE — Progress Notes (Signed)
No ICM remote transmission received for 05/20/2023 and next ICM transmission scheduled for 06/03/2023.

## 2023-05-29 ENCOUNTER — Encounter: Payer: Self-pay | Admitting: *Deleted

## 2023-05-29 DIAGNOSIS — Z006 Encounter for examination for normal comparison and control in clinical research program: Secondary | ICD-10-CM

## 2023-05-29 NOTE — Research (Signed)
REBALANCE Study   Called patient to inform her the study will be ending on 07-08-2023 and since she has made it to her 12 month visit she has completed the study. Thanked her for her participation and will enter end of study visit in [facility].   [place] Ashtin Melichar, Research Coordinator 05/29/2023  10:30 am

## 2023-05-31 ENCOUNTER — Ambulatory Visit (INDEPENDENT_AMBULATORY_CARE_PROVIDER_SITE_OTHER): Payer: 59

## 2023-05-31 DIAGNOSIS — I428 Other cardiomyopathies: Secondary | ICD-10-CM

## 2023-06-03 ENCOUNTER — Ambulatory Visit: Payer: 59 | Attending: Cardiology

## 2023-06-03 DIAGNOSIS — I5022 Chronic systolic (congestive) heart failure: Secondary | ICD-10-CM | POA: Diagnosis not present

## 2023-06-03 DIAGNOSIS — Z95810 Presence of automatic (implantable) cardiac defibrillator: Secondary | ICD-10-CM

## 2023-06-04 ENCOUNTER — Telehealth: Payer: Self-pay

## 2023-06-04 NOTE — Telephone Encounter (Signed)
 ICM call to patient. Advised device monitor is showing disconnected. She has been having phone issues but resolved it today.  She will contact [employer] and reinstall the app.  She will send a remote transmission as soon as she gets the app installed.

## 2023-06-05 ENCOUNTER — Telehealth: Payer: Self-pay

## 2023-06-05 NOTE — Telephone Encounter (Signed)
Remote ICM transmission received.  Attempted call to patient regarding ICM remote transmission and no answer.  Mail box full.  

## 2023-06-05 NOTE — Progress Notes (Signed)
 Spoke with patient and heart failure questions reviewed.  Transmission results reviewed.  Pt reports feeling a little SOB.   Reports feeling well at this time and voices no complaints.  She has been drinking more water and also had a cold in the last couple of weeks.    She takes Torsemide differently.  She takes 15 mg twice a day because the full dosage makes her feel bad and unable to tolerate.   Weight: 256-260 lbs   Advised to limit fluid intake and take a full dosage of Torsemide if she can tolerate it.    Will recheck fluid levels next week.

## 2023-06-05 NOTE — Progress Notes (Signed)
 EPIC Encounter for ICM Monitoring  Patient Name: Tara Bradshaw is a 38 y.o. female Date: 06/05/2023 Primary Care Physican: Patient, No Pcp Per Primary Cardiologist: Bensimhon Electrophysiologist: Lalla Brothers 03/01/2023 Office Weight: 261 lbs                  Attempted call to patient and unable to reach.   Transmission results reviewed.    Corvue thoracic impedance suggesting possible fluid accumulation 2/24.       Prescribed: Torsemide 10 mg take 3 tablet(s) (30 mg total) by mouth twice a day.   Potassium 20 mEq take 2 tablet(s) (40 mg total) by mouth daily.   Labs: 11/15/2022 Creatinine 0.79, BUN 9,   Potassium 4.4, Sodium 136, GFR >60  11/11/2022 Creatinine 0.84, BUN 12, Potassium 3.3, Sodium 136, GFR >60  11/10/2022 Creatinine 0.73, BUN 11, Potassium 3.7, Sodium 137, GFR >60  10/09/2022 Creatinine 0.62, BUN <5, Potassium 3.7, Sodium 137, GFR >60 09/24/2022 Creatinine 0.64, BUN   9, Potassium 3.8, Sodium 138, GFR >60 09/12/2022 Creatinine 0.71, BUN   5, Potassium 3.4, Sodium 135, GFR >60 A complete set of results can be found in Results Review.   Recommendations:  Unable to reach.     Follow-up plan: ICM clinic phone appointment on 06/10/2023 to recheck fluid levels.   91 day device clinic remote transmission 08/30/2023.       EP/Cardiology Office Visits:   06/14/2023 with Dr Gala Romney.    Recall 05/30/2023 with Otilio Saber, PA (barostim f/u).  Recall 02/29/2024 with Dr Lalla Brothers.   Copy of ICM check sent to Dr. Lalla Brothers.    3 month ICM trend: 06/04/2023.    12-14 Month ICM trend:     Karie Soda, RN 06/05/2023 8:56 AM

## 2023-06-10 ENCOUNTER — Ambulatory Visit: Payer: 59 | Attending: Cardiology

## 2023-06-10 DIAGNOSIS — I5022 Chronic systolic (congestive) heart failure: Secondary | ICD-10-CM

## 2023-06-10 DIAGNOSIS — Z95810 Presence of automatic (implantable) cardiac defibrillator: Secondary | ICD-10-CM

## 2023-06-12 NOTE — Progress Notes (Signed)
 EPIC Encounter for ICM Monitoring  Patient Name: Tara Bradshaw is a 38 y.o. female Date: 06/12/2023 Primary Care Physican: Patient, No Pcp Per Primary Cardiologist: Bensimhon Electrophysiologist: Lalla Brothers 03/01/2023 Office Weight: 261 lbs 06/05/2023 Weight: 256-260 lbs  06/12/2023 Weight: 255 lbs                 Spoke with patient and heart failure questions reviewed.  Transmission results reviewed.  Pt reports SOB resolved and weight decrease by a couple of pounds.     Corvue thoracic impedance suggesting possible fluid accumulation 2/24.       Prescribed: Torsemide 10 mg take 3 tablet(s) (30 mg total) by mouth twice a day.  Takes differently:  Takes 15 mg bid due to unable to tolerate full dose Potassium 20 mEq take 2 tablet(s) (40 mg total) by mouth daily.   Labs: 11/15/2022 Creatinine 0.79, BUN 9,   Potassium 4.4, Sodium 136, GFR >60  11/11/2022 Creatinine 0.84, BUN 12, Potassium 3.3, Sodium 136, GFR >60  11/10/2022 Creatinine 0.73, BUN 11, Potassium 3.7, Sodium 137, GFR >60  10/09/2022 Creatinine 0.62, BUN <5, Potassium 3.7, Sodium 137, GFR >60 09/24/2022 Creatinine 0.64, BUN   9, Potassium 3.8, Sodium 138, GFR >60 09/12/2022 Creatinine 0.71, BUN   5, Potassium 3.4, Sodium 135, GFR >60 A complete set of results can be found in Results Review.   Recommendations:  No changes and encouraged to call if experiencing any fluid symptoms.   Follow-up plan: ICM clinic phone appointment on 07/08/2023.   91 day device clinic remote transmission 08/30/2023.       EP/Cardiology Office Visits:   06/14/2023 with Dr Gala Romney.    06/21/2023 with Otilio Saber, PA (barostim f/u).  Recall 02/29/2024 with Dr Lalla Brothers.   Copy of ICM check sent to Dr. Lalla Brothers.    3 month ICM trend: 06/09/2023.    12-14 Month ICM trend:     Karie Soda, RN 06/12/2023 9:17 AM

## 2023-06-14 ENCOUNTER — Ambulatory Visit (HOSPITAL_COMMUNITY)
Admission: RE | Admit: 2023-06-14 | Discharge: 2023-06-14 | Disposition: A | Discharge status: Home / Self Care | Payer: 59 | Source: Ambulatory Visit | Attending: Internal Medicine | Admitting: Internal Medicine

## 2023-06-14 ENCOUNTER — Encounter (HOSPITAL_COMMUNITY): Payer: Self-pay | Admitting: Internal Medicine

## 2023-06-14 VITALS — BP 112/78 | HR 88 | Wt 264.8 lb

## 2023-06-14 DIAGNOSIS — I5022 Chronic systolic (congestive) heart failure: Secondary | ICD-10-CM | POA: Diagnosis present

## 2023-06-14 DIAGNOSIS — Z79899 Other long term (current) drug therapy: Secondary | ICD-10-CM | POA: Diagnosis not present

## 2023-06-14 DIAGNOSIS — I110 Hypertensive heart disease with heart failure: Secondary | ICD-10-CM | POA: Diagnosis not present

## 2023-06-14 DIAGNOSIS — Z6841 Body Mass Index (BMI) 40.0 and over, adult: Secondary | ICD-10-CM | POA: Diagnosis not present

## 2023-06-14 DIAGNOSIS — G4733 Obstructive sleep apnea (adult) (pediatric): Secondary | ICD-10-CM

## 2023-06-14 DIAGNOSIS — I5082 Biventricular heart failure: Secondary | ICD-10-CM | POA: Diagnosis not present

## 2023-06-14 DIAGNOSIS — E669 Obesity, unspecified: Secondary | ICD-10-CM | POA: Diagnosis not present

## 2023-06-14 DIAGNOSIS — I428 Other cardiomyopathies: Secondary | ICD-10-CM | POA: Diagnosis not present

## 2023-06-14 DIAGNOSIS — Z7984 Long term (current) use of oral hypoglycemic drugs: Secondary | ICD-10-CM | POA: Insufficient documentation

## 2023-06-14 DIAGNOSIS — Z95810 Presence of automatic (implantable) cardiac defibrillator: Secondary | ICD-10-CM | POA: Diagnosis not present

## 2023-06-14 DIAGNOSIS — I10 Essential (primary) hypertension: Secondary | ICD-10-CM | POA: Diagnosis not present

## 2023-06-14 DIAGNOSIS — I493 Ventricular premature depolarization: Secondary | ICD-10-CM

## 2023-06-14 LAB — BASIC METABOLIC PANEL
Anion gap: 9 (ref 5–15)
BUN: 9 mg/dL (ref 6–20)
CO2: 25 mmol/L (ref 22–32)
Calcium: 9.6 mg/dL (ref 8.9–10.3)
Chloride: 105 mmol/L (ref 98–111)
Creatinine, Ser: 0.44 mg/dL (ref 0.44–1.00)
GFR, Estimated: >60 mL/min (ref >60)
Glucose, Bld: 95 mg/dL (ref 70–99)
Potassium: 3.7 mmol/L (ref 3.5–5.1)
Sodium: 139 mmol/L (ref 135–145)

## 2023-06-14 LAB — BRAIN NATRIURETIC PEPTIDE: B Natriuretic Peptide: 791.5 pg/mL — ABNORMAL HIGH (ref 0.0–100.0)

## 2023-06-14 MED ORDER — SPIRONOLACTONE 25 MG PO TABS: 12.5000 mg | ORAL_TABLET | Freq: Every day | ORAL | 3 refills | Status: DC

## 2023-06-14 MED ORDER — GABAPENTIN 300 MG PO CAPS: 300.0000 mg | ORAL_CAPSULE | Freq: Every day | ORAL | 0 refills | Status: DC

## 2023-06-14 MED ORDER — POTASSIUM CHLORIDE CRYS ER 20 MEQ PO TBCR: 40.0000 meq | EXTENDED_RELEASE_TABLET | Freq: Every day | ORAL | Status: DC

## 2023-06-14 NOTE — Patient Instructions (Signed)
 START  Spironolactone 12.5 mg ( 1/2 Tab) daily  CHANGE Potassium to 40 mEq Daily.  Labs done today, your results will be available in MyChart, we will contact you for abnormal readings.  Your physician has requested that you have an echocardiogram. Echocardiography is a painless test that uses sound waves to create images of your heart. It provides your doctor with information about the size and shape of your heart and how well your heart's chambers and valves are working. This procedure takes approximately one hour. There are no restrictions for this procedure. Please do NOT wear cologne, perfume, aftershave, or lotions (deodorant is allowed). Please arrive 15 minutes prior to your appointment time.  Please note: We ask at that you not bring children with you during ultrasound (echo/ vascular) testing. Due to room size and safety concerns, children are not allowed in the ultrasound rooms during exams. Our front office staff cannot provide observation of children in our lobby area while testing is being conducted. An adult accompanying a patient to their appointment will only be allowed in the ultrasound room at the discretion of the ultrasound technician under special circumstances. We apologize for any inconvenience.  Your physician recommends that you schedule a follow-up appointment in: 4 months.  If you have any questions or concerns before your next appointment please send [place] a message through [place] or call our office at [phone].    TO LEAVE A MESSAGE FOR THE NURSE SELECT OPTION 2, PLEASE LEAVE A MESSAGE INCLUDING: YOUR NAME DATE OF BIRTH CALL BACK NUMBER REASON FOR CALL**this is important as we prioritize the call backs  YOU WILL RECEIVE A CALL BACK THE SAME DAY AS LONG AS YOU CALL BEFORE 4:00 PM  At the [facility], you and your health needs are our priority. As part of our continuing mission to provide you with exceptional heart care, we have created  designated Provider Care Teams. These Care Teams include your primary Cardiologist (physician) and Advanced Practice Providers (APPs- Physician Assistants and Nurse Practitioners) who all work together to provide you with the care you need, when you need it.   You may see any of the following providers on your designated Care Team at your next follow up: Dr Arvilla Meres Dr Marca Ancona Dr. Dorthula Nettles Dr. Clearnce Hasten Amy Filbert Schilder, NP Robbie Lis, [place] [facility] [place], [place] Brynda Peon, NP [place] Lee, NP Clarisa Kindred, NP Karle Plumber, PharmD Enos Fling, PharmD   Please be sure to bring in all your medications bottles to every appointment.    Thank you for choosing [facility]

## 2023-06-14 NOTE — Progress Notes (Addendum)
 Advanced Heart Failure Clinic Note PCP: Patient, No Pcp Per HF Cardiologist: Dr. Gala Romney   HPI: Tara Bradshaw is a 38 y.o. female w/ nonischemic cardiomyopathy s/p SICD, HTN and systolic heart failure diagnosed in 06/26/21 w/ LVEF 20-25% , and s/p barostim 10/23.  Admitted in 3/20 with acute systolic HF with EF 20-25% in setting of recent viral illness and 23-month post-partum status. COVID testing negative.   ZioPatch 8/20 NSR. Occasional PVCs (3.5%) 9-beat run NSVT    Had return visit w/ Dr. Gala Romney 01/15/19. Endorsed fatigue after starting carvedilol.    R/LHC on 01/22/19 which showed severe NICM EF 20%, Normal coronaries, Well compensated hemodynamics with high cardiac output.    CPX 12/21 FVC 2.68 (93%)      FEV1 2.27 (93%)        FEV1/FVC 85 (100%)        MVV 84 (84%)    Resting HR: 70 Standing HR: 70 Peak HR: 135   (73% age predicted max HR)   BP rest: 148/94 Standing BP: 126/92 BP peak: 166/84  Peak VO2: 14.9 (76% predicted peak VO2) When adjusted to the patient's ideal body weight of 127.7 lb (57.9 kg) the peak VO2 is 29.4 ml/kg (ibw)/min (91% of the ibw-adjusted predicted).  VE/VCO2 slope:  27  Peak RER: 0.95   Follow up 1/24, volume overloaded with worse NYHA III symptoms, despite 1 week of increased diuresis. Arranged for RHC which showed RA 13, PA 49/33 (41), Fick CO/CI 6.3/3, Thermo CO/CI 6.4/3, PVR 2.3, PAPi 2.5. Biventricular heart failure with elevated filling pressures. Preserved CO.   Zio (2/24) showed mostly NSR with 12.5% PVC burden, 45 runs of NSVT  Follow up 5/24, volume up and weight up, REDs 47%. Instructed to take metolazone 2.5/40 KCL x 2 days and increase torsemide to 40 bid. Anemia panel showed iron deficient and arranged for iron infusion.  Admitted 11/10/22 with multifocal pneumonia.Negative SARS2.  Treated with antibiotics. HF meds cut back due to soft BP, Toprol XL 12.5 mg daily. Metolazone and spironolactone stopped.   Here for routine  follow-up. Has good days and bad days. On good days she is quite active. On bad days quite fatigued and takes a while to recover. Fluid up and down. Unable to tolerate torsemide so switched back to lasix. Now taking lasix 40 tid. Takes extra as needed (maybe once a week). BP ok. No longer taking Ozempic due to insurance coverage. No CP, orthopnea or PND.   Cardiac Studies - Zio (2/24): NSR avg HR 82 bpm, 45 runs NSVT (longest 8 beats), 2 runs SVT (5 beats max), 12.5% PVCs  - RHC (1/24): RA 13, PA 49/33 (41), PCWP 26, CO/CI (Fick): 6.3/3, PVR 2.3, PAPi 2.5.   - Echo (7/23): EF 25%-30%, severely dilated LV; RV mildly enlarged.   - CPX (03/17/20):  Submax test. No evidence of significant cardiopulmonary limitation despite LV dysfunction. When corrected to ideal body weight, measured pVO2 is normal. Suspect majority of limitation related to obesity.   - Echo (12/21): EF 25-30%  - Echo (10/20): EF 25-30%, (read as 30-35%)  Past Medical History:  Diagnosis Date   AICD (automatic cardioverter/defibrillator) present    Anemia    CHRONIC   CHF (congestive heart failure) (HCC)    Chronic systolic dysfunction of left ventricle    GERD (gastroesophageal reflux disease)    WITH PREGNANCY   Nonischemic cardiomyopathy (HCC)    Pneumonia    x 1   PVC's (premature ventricular contractions)  Sleep apnea    Current Outpatient Medications  Medication Sig Dispense Refill   acetaminophen (TYLENOL) 325 MG tablet Take 650 mg by mouth every 6 (six) hours as needed for mild pain or headache.     cyclobenzaprine (FLEXERIL) 10 MG tablet Take 1 tablet (10 mg total) by mouth 3 (three) times daily as needed for muscle spasms. 10 tablet 0   dapagliflozin propanediol (FARXIGA) 10 MG TABS tablet Take 1 tablet (10 mg total) by mouth daily before breakfast. 90 tablet 3   furosemide (LASIX) 40 MG tablet Take 40 mg by mouth in the morning, at noon, and at bedtime.     levonorgestrel (MIRENA) 20 MCG/24HR IUD 1 each  by Intrauterine route once.     meclizine (ANTIVERT) 25 MG tablet Take 1 tablet (25 mg total) by mouth 3 (three) times daily as needed for dizziness. 30 tablet 0   Multiple Vitamins-Minerals (WOMENS MULTIVITAMIN PO) Take 1 tablet by mouth daily.     NON FORMULARY Pt uses a cpap nightly     potassium chloride SA (KLOR-CON M) 20 MEQ tablet Take 40 mEq by mouth 2 (two) times daily.     sacubitril-valsartan (ENTRESTO) 97-103 MG Take 1 tablet by mouth 2 (two) times daily. 180 tablet 3   gabapentin (NEURONTIN) 300 MG capsule Take 1 capsule (300 mg total) by mouth daily. (Patient not taking: Reported on 06/14/2023) 30 capsule 1   Semaglutide,0.25 or 0.5MG /DOS, 2 MG/3ML SOPN Inject 0.5 mg into the skin once a week. 3 mL 0   No current facility-administered medications for this encounter.   No Known Allergies  Social History   Socioeconomic History   Marital status: Widowed    Spouse name: Not on file   Number of children: Not on file   Years of education:  14   Highest education level: Not on file  Occupational History   Occupation: SECURITY OFFICER    Employer: [employer]  Tobacco Use   Smoking status: Never    Passive exposure: Never   Smokeless tobacco: Never  Vaping Use   Vaping status: Never Used  Substance and Sexual Activity   Alcohol use: No   Drug use: No   Sexual activity: Yes    Partners: Male    Birth control/protection: None  Other Topics Concern   Not on file  Social History Narrative   Lives in [place] with husband and children (6,1)   Unemployed    Previously worked in Office manager.   Social Drivers of Corporate investment banker Strain: Low Risk  (12/05/2017)   Overall Financial Resource Strain (CARDIA)    Difficulty of Paying Living Expenses: Not hard at all  Food Insecurity: No Food Insecurity (12/05/2017)   Hunger Vital Sign    Worried About Running Out of Food in the Last Year: Never true    Ran Out of Food in the Last Year: Never true   Transportation Needs: No Transportation Needs (12/05/2017)   PRAPARE - Administrator, Civil Service (Medical): No    Lack of Transportation (Non-Medical): No  Physical Activity: Inactive (12/05/2017)   Exercise Vital Sign    Days of Exercise per Week: 0 days    Minutes of Exercise per Session: 0 min  Stress: Not on file  Social Connections: Not on file  Intimate Partner Violence: Not At Risk (12/05/2017)   Humiliation, Afraid, Rape, and Kick questionnaire    Fear of Current or Ex-Partner: No    Emotionally Abused:  No    Physically Abused: No    Sexually Abused: No   Family History  Problem Relation Age of Onset   Epilepsy Mother    Asthma Mother    Cancer Paternal Grandmother        BREAST   Cancer Maternal Aunt 46       BREAST   Hypertension Maternal Grandmother    BP 112/78   Pulse 88   Wt 120.1 kg (264 lb 12.8 oz)   SpO2 97%   BMI 45.45 kg/m   Wt Readings from Last 3 Encounters:  06/14/23 120.1 kg (264 lb 12.8 oz)  03/01/23 118.4 kg (261 lb)  11/15/22 116.6 kg (257 lb)   Physical exam:  General:  Well appearing. No resp difficulty HEENT: normal Neck: supple. no JVD. Carotids 2+ bilat; no bruits. No lymphadenopathy or thryomegaly appreciated. Cor: PMI nondisplaced. Regular rate & rhythm. No rubs, gallops or murmurs. Lungs: clear Abdomen: obese soft, nontender, nondistended. No hepatosplenomegaly. No bruits or masses. Good bowel sounds. Extremities: no cyanosis, clubbing, rash, edema Neuro: alert & orientedx3, cranial nerves grossly intact. moves all 4 extremities w/o difficulty. Affect pleasant  Device interrogation: No VT/AF. Fluid ok Personally reviewed  ASSESSMENT & PLAN: Chronic HFrEF:  - Diagnosed 3/20. Echo LVEF 25% with moderate RV dysfunction - NICM. Either viral (had URI in week prior to admission and respiratory panel + rhinovirus) or post-partum or PVC cardiomyopathy.  - LHC 10/20 showed normal cors. RHC showed well compensated  hemodynamics with high cardiac output. No evidence of intracardiac shunting.  - Echo 10/20 EF 25-30% - Echo 12/21 EF 25-30% - Echo 10/09/21 EF 25-30% Personally reviewed - CPX 1/22 Peak VO2: 14.9 (76% predicted peak VO2) adjusted to ibw pVO2 is 29.4 ml/kg (ibw)/min (91% of the ibw-adjusted predicted). VE/VCO2 slope:  27 - S/p barostim 10/23 - RHC (1/24): RA 13, PA 49/33 (41), Fick CO/CI 6.3/3, Thermo CO/CI 6.4/3, PVR 2.3, PAPi 2.5. Biventricular heart failure with elevated filling pressures. Preserved CO.  - Zio 2/24 NSR avg HR 82 bpm, 45 runs NSVT (longest 8 beats), 2 runs SVT (5 beats max), 12.5% PVCs - NYHA II-III - Volume ok  - Continue Farxiga 10 mg daily. - Continue Entresto 97/103 mg bid - Continue Toprol XL 12.5 mg at bedtime.  - Start spiro 12.5mg  daily - Decreased kcl to 40 daily - Labs today and 2 weeks - Due for repeat echo    2. HTN -Blood pressure well controlled. Continue current regimen.  3. PVCs:  - Zio (2/24) showed 12.5 % PVC burden - Amio stopped 7/23 as PVC suppression did not improve EF and favor not exposing her to long-term side effects  4. OSA - Moderate on sleep study (4/20) - Continue CPAP  5. Obesity - Body mass index is 45.45 kg/m. - Off GLP1RA due to insurance - needs weight loss  Arvilla Meres MD  06/14/23 12:45 PM   [facility] [address] [facility] [place] [address] [phone] (office) [phone] (fax)

## 2023-06-17 ENCOUNTER — Other Ambulatory Visit (HOSPITAL_COMMUNITY): Payer: Self-pay | Admitting: Internal Medicine

## 2023-06-17 DIAGNOSIS — I5022 Chronic systolic (congestive) heart failure: Secondary | ICD-10-CM

## 2023-06-20 NOTE — Progress Notes (Unsigned)
  Cardiology Office Note:   Date:  06/21/2023  ID:  Tara Bradshaw, DOB [birth year], MRN [id number]  Primary Cardiologist: None Electrophysiologist: Lanier Prude, MD   History of Present Illness:   Tara Bradshaw is a 38 y.o. female with h/o ICM, chronic systolic CHF, OSA, PVCs, and s/p ICD seen today for routine electrophysiology followup.   Since last being seen in our clinic the patient reports doing OK. She has been struggling with fluid ups and downs and working with HF team for her diuretics. Otherwise, she denies chest pain, palpitations, new dyspnea, PND, orthopnea, nausea, vomiting, dizziness, syncope, weight gain, or early satiety.  She has some tenderness and swelling in her ankles when her fluid is up.   Review of systems complete and found to be negative unless listed in HPI.    EP Information / Studies Reviewed:    EKG is not ordered today. EKG from 06/14/2023 reviewed which showed NSR at 97 bpm with PVCs   Arrhythmia/Device History Abbott single chamber ICD implanted 02/19/2019 Lead dislodgement > revision 04/16/2019   Barostim (Commercial)  01/11/2022 by Dr. Binnie Kand Interrogation- Performed personally and reviewed in detail today,  See scanned report  ICD/PPM interrogation - Not performed today. See last PaceArt report. Full check performed 03/01/2023    Physical Exam:   VS:  BP 108/78   Pulse 74   Ht 5\' 1"  (1.549 m)   Wt 264 lb (119.7 kg)   SpO2 99%   BMI 49.88 kg/m    Wt Readings from Last 3 Encounters:  06/21/23 264 lb (119.7 kg)  06/14/23 264 lb 12.8 oz (120.1 kg)  03/01/23 261 lb (118.4 kg)     GEN: Well nourished, well developed in no acute distress NECK: No JVD; No carotid bruits CARDIAC: Regular rate and rhythm, no murmurs, rubs, gallops RESPIRATORY:  Clear to auscultation without rales, wheezing or rhonchi  ABDOMEN: Soft, non-tender, non-distended EXTREMITIES:  No edema; No deformity   ASSESSMENT AND PLAN:    Chronic systolic CHF  s/p St. Jude Defibrillator and Barostim implantation NYHA III symptoms.   Device programmed at 3.0 @ 65 [place] for chronic settings. Due to chronic site pain. Device impedence stable. Normal device function See scanned report.  PVCs Continue metoprolol  HTN Stable on current regimen   OSA  Encouraged nightly CPAP    Disposition:   Follow up with EP APP in November for ICD + barostim check (6 month)  Signed, Graciella Freer, PA-C

## 2023-06-21 ENCOUNTER — Encounter: Payer: Self-pay | Admitting: Student

## 2023-06-21 ENCOUNTER — Ambulatory Visit: Payer: 59 | Attending: Student | Admitting: Student

## 2023-06-21 VITALS — BP 108/78 | HR 74 | Ht 61.0 in | Wt 264.0 lb

## 2023-06-21 DIAGNOSIS — I5022 Chronic systolic (congestive) heart failure: Secondary | ICD-10-CM | POA: Diagnosis not present

## 2023-06-21 DIAGNOSIS — G4733 Obstructive sleep apnea (adult) (pediatric): Secondary | ICD-10-CM

## 2023-06-21 DIAGNOSIS — I493 Ventricular premature depolarization: Secondary | ICD-10-CM

## 2023-06-21 DIAGNOSIS — Z95810 Presence of automatic (implantable) cardiac defibrillator: Secondary | ICD-10-CM

## 2023-06-21 DIAGNOSIS — I10 Essential (primary) hypertension: Secondary | ICD-10-CM

## 2023-06-21 NOTE — Patient Instructions (Signed)
 Medication Instructions:  Your physician recommends that you continue on your current medications as directed. Please refer to the Current Medication list given to you today.  *If you need a refill on your cardiac medications before your next appointment, please call your pharmacy*  Lab Work: None ordered If you have labs (blood work) drawn today and your tests are completely normal, you will receive your results only by: MyChart Message (if you have MyChart) OR A paper copy in the mail If you have any lab test that is abnormal or we need to change your treatment, we will call you to review the results.  Follow-Up: At [facility], you and your health needs are our priority.  As part of our continuing mission to provide you with exceptional heart care, we have created designated Provider Care Teams.  These Care Teams include your primary Cardiologist (physician) and Advanced Practice Providers (APPs -  Physician Assistants and Nurse Practitioners) who all work together to provide you with the care you need, when you need it.  Your next appointment:   November 2025  Provider:   Casimiro Needle "Mardelle Matte" [place], [place]

## 2023-07-02 NOTE — Progress Notes (Signed)
 Remote ICD transmission.

## 2023-07-08 ENCOUNTER — Ambulatory Visit: Attending: Cardiology

## 2023-07-08 DIAGNOSIS — I5022 Chronic systolic (congestive) heart failure: Secondary | ICD-10-CM

## 2023-07-08 DIAGNOSIS — Z95810 Presence of automatic (implantable) cardiac defibrillator: Secondary | ICD-10-CM

## 2023-07-09 NOTE — Progress Notes (Signed)
 ANNUAL EXAM Patient name: Tara Bradshaw MRN [id number]  Date of birth: [birth year] Chief Complaint:   No chief complaint on file.  History of Present Illness:   Tara Bradshaw is a 38 y.o. [id number] being seen today for a routine annual exam.  Current complaints: annual  Discussed the use of AI scribe software for clinical note transcription with the patient, who gave verbal consent to proceed.  History of Present Illness Tara Bradshaw is a 37 year old female who presents for an annual Pap smear.  She had an abnormal Pap smear in 2018 or 2019 but did not require any procedures following that result.  She is currently using a Mirena IUD for birth control, inserted in 2019. She experiences irregular bleeding patterns, with cycles sometimes lasting only a few hours or absent for months. The bleeding is not heavy, and she is satisfied with this pattern.  She is sexually active and declines STI testing at this time. No pain with intercourse and no changes in her breasts or nipples.    No LMP recorded. (Menstrual status: IUD).   The pregnancy intention screening data noted above was reviewed. Potential methods of contraception were discussed. The patient elected to proceed with No data recorded.   Last pap     Component Value Date/Time   DIAGPAP  11/26/2016 0000    NEGATIVE FOR INTRAEPITHELIAL LESIONS OR MALIGNANCY.   ADEQPAP  11/26/2016 0000    Satisfactory for evaluation  endocervical/transformation zone component PRESENT.     H/O abnormal pap: yes Last mammogram: n/a.  Last colonoscopy: n/a.      10/24/2019    3:00 PM 07/25/2018    9:08 AM 12/19/2016    3:01 PM  Depression screen PHQ 2/9  Decreased Interest  0 2  Down, Depressed, Hopeless  0 2  PHQ - 2 Score  0 4  Altered sleeping   2  Tired, decreased energy   2  Change in appetite   2  Feeling bad or failure about yourself    2  Trouble concentrating   2  Moving slowly or fidgety/restless   2  Suicidal thoughts   0   PHQ-9 Score   16  Difficult doing work/chores        Information is confidential and restricted. Go to Review Flowsheets to unlock data.        10/24/2019    2:59 PM 07/25/2018    9:09 AM 12/19/2016    3:01 PM  GAD 7 : Generalized Anxiety Score  Nervous, Anxious, on Edge  0 2  Control/stop worrying  0 2  Worry too much - different things  0 2  Trouble relaxing  0 2  Restless  0 2  Easily annoyed or irritable  0 2  Afraid - awful might happen  0 0  Total GAD 7 Score  0 12  Anxiety Difficulty        Information is confidential and restricted. Go to Review Flowsheets to unlock data.     Review of Systems:   Pertinent items are noted in HPI Denies any headaches, blurred vision, fatigue, shortness of breath, chest pain, abdominal pain, abnormal vaginal discharge/itching/odor/irritation, problems with periods, bowel movements, urination, or intercourse unless otherwise stated above. Pertinent History Reviewed:  Reviewed past medical,surgical, social and family history.  Reviewed problem list, medications and allergies. Physical Assessment:   Vitals:   07/10/23 0911  BP: 116/81  Pulse: 87  Weight: 262 lb (118.8 kg)  Height: 5\' 2"  (  1.575 m)  Body mass index is 47.92 kg/m.        Physical Examination:   General appearance - well appearing, and in no distress  Mental status - alert, oriented to person, place, and time  Psych:  She has a normal mood and affect  Skin - warm and dry, normal color, no suspicious lesions noted  Chest - effort normal, all lung fields clear to auscultation bilaterally  Heart - normal rate and regular rhythm  Breasts - breasts appear normal, no suspicious masses, no skin or nipple changes or  axillary nodes  Abdomen - soft, nontender, nondistended, no masses or organomegaly  Pelvic -  VULVA: normal appearing vulva with no masses, tenderness or lesions   VAGINA: normal appearing vagina with normal color and discharge, no lesions   CERVIX: normal  appearing cervix without discharge or lesions, no CMT, IUD strings visualized  Thin prep pap is done with HR HPV cotesting  UTERUS: uterus is felt to be normal size, shape, consistency and nontender   ADNEXA: No adnexal masses or tenderness noted.  Extremities:  No swelling or varicosities noted  Chaperone present for exam  No results found for this or any previous visit (from the past 24 hours).    Assessment & Plan:  Assessment and Plan Assessment & Plan Routine gynecological examination Annual exam with Pap smear and breast exam performed. History of abnormal Pap smear in 2018 or 2019 without further procedures. Satisfied with current bleeding pattern on Mirena IUD. - Cervical cancer screening: Discussed guidelines. Pap with HPV collected - Birth Control: IUD - Breast Health: Encouraged self breast awareness/SBE. Teaching provided.  - F/U 12 months and prn   Contraceptive management with Mirena IUD Mirena IUD inserted in 2019, effective for up to eight years. Satisfied with current bleeding pattern. Advised on monitoring bleeding and potential early replacement if patterns change. Discussed reliability and potential for tubal ligation if permanent contraception desired. - Monitor bleeding patterns with Mirena IUD, will keep in place for full 8 years - Discuss potential IUD replacement if bleeding patterns change. - Consider tubal ligation if she decides on permanent contraception.   No orders of the defined types were placed in this encounter.   Meds: No orders of the defined types were placed in this encounter.   Follow-up: Return if symptoms worsen or fail to improve, for Annual GYN.  Lorriane Shire, MD 07/09/2023 6:04 PM

## 2023-07-10 ENCOUNTER — Other Ambulatory Visit (HOSPITAL_COMMUNITY)
Admission: RE | Admit: 2023-07-10 | Discharge: 2023-07-10 | Disposition: A | Discharge status: Home / Self Care | Source: Ambulatory Visit | Attending: Obstetrics and Gynecology | Admitting: Obstetrics and Gynecology

## 2023-07-10 ENCOUNTER — Ambulatory Visit: Admitting: Obstetrics and Gynecology

## 2023-07-10 ENCOUNTER — Encounter: Payer: Self-pay | Admitting: Obstetrics and Gynecology

## 2023-07-10 ENCOUNTER — Encounter: Admitting: Obstetrics and Gynecology

## 2023-07-10 VITALS — BP 116/81 | HR 87 | Ht 62.0 in | Wt 262.0 lb

## 2023-07-10 DIAGNOSIS — Z124 Encounter for screening for malignant neoplasm of cervix: Secondary | ICD-10-CM | POA: Diagnosis not present

## 2023-07-10 DIAGNOSIS — Z1151 Encounter for screening for human papillomavirus (HPV): Secondary | ICD-10-CM | POA: Diagnosis not present

## 2023-07-10 DIAGNOSIS — Z01419 Encounter for gynecological examination (general) (routine) without abnormal findings: Secondary | ICD-10-CM | POA: Insufficient documentation

## 2023-07-10 NOTE — Progress Notes (Signed)
 EPIC Encounter for ICM Monitoring  Patient Name: Tara Bradshaw is a 38 y.o. female Date: 07/10/2023 Primary Care Physican: Patient, No Pcp Per Primary Cardiologist: Bensimhon Electrophysiologist: Lalla Brothers 03/01/2023 Office Weight: 261 lbs 07/10/2023 Weight: 261 lbs  NSVT 4                 Spoke with patient and heart failure questions reviewed.  Transmission results reviewed.  Pt asymptomatic for fluid accumulation.  Reports feeling well at this time and voices no complaints.     Corvue thoracic impedance suggesting normal fluid levels with the exception of possible fluid accumulation from 3/12-3/21.       Prescribed: Furosemide 40 mg take 1 tablet(s) (30 mg total) by mouth morning, noon and at bedtime.   Potassium 20 mEq take 2 tablet(s) (40 mg total) by mouth daily.   Labs: 06/14/2023 Creatinine 0.44, BUN 9, Potassium 3.7, Sodium 139, GFR <60, BNP 791.5 11/15/2022 Creatinine 0.79, BUN 9, Potassium 4.4, Sodium 136, GFR >60  A complete set of results can be found in Results Review.   Recommendations:  No changes and encouraged to call if experiencing any fluid symptoms.   Follow-up plan: ICM clinic phone appointment on 08/12/2023.   91 day device clinic remote transmission 08/30/2023.       EP/Cardiology Office Visits:   10/22/2023 with HF clinic.    Recall 02/16/2024 with Otilio Saber, PA.   Copy of ICM check sent to Dr. Lalla Brothers.    3 month ICM trend: 07/07/2023.    12-14 Month ICM trend:     Karie Soda, RN 07/10/2023 10:59 AM

## 2023-07-10 NOTE — Progress Notes (Unsigned)
 Pt presents for AEX Last PAP 2018 Mirena IUD placed 2019

## 2023-07-11 ENCOUNTER — Other Ambulatory Visit: Payer: Self-pay | Admitting: Obstetrics and Gynecology

## 2023-07-11 ENCOUNTER — Encounter: Payer: Self-pay | Admitting: Obstetrics and Gynecology

## 2023-07-11 DIAGNOSIS — B9689 Other specified bacterial agents as the cause of diseases classified elsewhere: Secondary | ICD-10-CM

## 2023-07-11 LAB — CYTOLOGY - PAP
Comment: NEGATIVE
Diagnosis: NEGATIVE
High risk HPV: NEGATIVE

## 2023-07-11 MED ORDER — METRONIDAZOLE 500 MG PO TABS: 500.0000 mg | ORAL_TABLET | Freq: Two times a day (BID) | ORAL | 0 refills | Status: AC

## 2023-07-29 ENCOUNTER — Ambulatory Visit (HOSPITAL_COMMUNITY)
Admission: RE | Admit: 2023-07-29 | Discharge: 2023-07-29 | Disposition: A | Discharge status: Home / Self Care | Source: Ambulatory Visit | Attending: Internal Medicine | Admitting: Internal Medicine

## 2023-07-29 DIAGNOSIS — I5022 Chronic systolic (congestive) heart failure: Secondary | ICD-10-CM | POA: Insufficient documentation

## 2023-07-29 DIAGNOSIS — Z006 Encounter for examination for normal comparison and control in clinical research program: Secondary | ICD-10-CM

## 2023-07-29 DIAGNOSIS — I509 Heart failure, unspecified: Secondary | ICD-10-CM | POA: Diagnosis present

## 2023-07-29 DIAGNOSIS — I081 Rheumatic disorders of both mitral and tricuspid valves: Secondary | ICD-10-CM | POA: Diagnosis not present

## 2023-07-29 DIAGNOSIS — Z95810 Presence of automatic (implantable) cardiac defibrillator: Secondary | ICD-10-CM | POA: Insufficient documentation

## 2023-07-29 DIAGNOSIS — I429 Cardiomyopathy, unspecified: Secondary | ICD-10-CM | POA: Diagnosis not present

## 2023-07-29 LAB — ECHOCARDIOGRAM COMPLETE
AR max vel: 3.22 cm2
AV Area VTI: 2.71 cm2
AV Area mean vel: 3.07 cm2
AV Mean grad: 1 mmHg
AV Peak grad: 2.4 mmHg
Ao pk vel: 0.77 m/s
Area-P 1/2: 4.57 cm2
Calc EF: 22.2 %
MV M vel: 3.92 m/s
MV Peak grad: 61.5 mmHg
S' Lateral: 6.1 cm
Single Plane A2C EF: 20.9 %
Single Plane A4C EF: 25.4 %

## 2023-07-29 NOTE — Progress Notes (Signed)
*  PRELIMINARY RESULTS* Echocardiogram 2D Echocardiogram has been performed.  Palmer Bobo 07/29/2023, 11:41 AM

## 2023-07-29 NOTE — Research (Signed)
 SITE: [id number]     Subject # [id number]   Subprotocol: A  Inclusion Criteria  Patients who meet all of the following criteria are eligible for enrollment as study participants:  Yes No  Age > 40 years old X   Eligible to wear Holter Study X    Exclusion Criteria  Patients who meet any of these criteria are not eligible for enrollment as study participants: Yes No  1. Receiving any mechanical (respiratory or circulatory) or renal support therapy at Screening or during Visit #1.  X  2.  Any other conditions that in the opinion of the investigators are likely to prevent compliance with the study protocol or pose a safety concern if the subject participates in the study.  X  3. Poor tolerance, namely susceptible to severe skin allergies from ECG adhesive patch application.  X   Protocol: REV H                                     Residential Zip code [address] (First 3 digits ONLY)                                             [facility] Informed Consent   Subject Name: Tara Bradshaw  Subject met inclusion and exclusion criteria.  The informed consent form, study requirements and expectations were reviewed with the subject. Subject had opportunity to read consent and questions and concerns were addressed prior to the signing of the consent form.  The subject verbalized understanding of the trial requirements.  The subject agreed to participate in the [facility] EF ACT trial and signed the informed consent at 11:36 on 29-Jul-2023.  The informed consent was obtained prior to performance of any protocol-specific procedures for the subject.  A copy of the signed informed consent was given to the subject and a copy was placed in the subject's medical record.   Jolee Naval          Current Outpatient Medications:    acetaminophen  (TYLENOL ) 325 MG tablet, Take 650 mg by mouth every 6 (six) hours as needed for mild pain or headache., Disp: , Rfl:    cyclobenzaprine  (FLEXERIL ) 10 MG tablet, Take 1 tablet (10 mg  total) by mouth 3 (three) times daily as needed for muscle spasms., Disp: 10 tablet, Rfl: 0   dapagliflozin  propanediol (FARXIGA ) 10 MG TABS tablet, Take 1 tablet (10 mg total) by mouth daily before breakfast., Disp: 90 tablet, Rfl: 3   furosemide  (LASIX ) 40 MG tablet, Take 40 mg by mouth in the morning, at noon, and at bedtime., Disp: , Rfl:    gabapentin  (NEURONTIN ) 300 MG capsule, Take 1 capsule (300 mg total) by mouth daily., Disp: 60 capsule, Rfl: 0   levonorgestrel  (MIRENA ) 20 MCG/24HR IUD, 1 each by Intrauterine route once., Disp: , Rfl:    meclizine  (ANTIVERT ) 25 MG tablet, Take 1 tablet (25 mg total) by mouth 3 (three) times daily as needed for dizziness., Disp: 30 tablet, Rfl: 0   Multiple Vitamins-Minerals (WOMENS MULTIVITAMIN PO), Take 1 tablet by mouth daily., Disp: , Rfl:    NON FORMULARY, Pt uses a cpap nightly, Disp: , Rfl:    potassium chloride  SA (KLOR-CON  M) 20 MEQ tablet, Take 2 tablets (40 mEq total) by mouth daily., Disp: , Rfl:  sacubitril -valsartan  (ENTRESTO ) 97-103 MG, Take 1 tablet by mouth 2 (two) times daily., Disp: 180 tablet, Rfl: 3   Semaglutide ,0.25 or 0.5MG /DOS, 2 MG/3ML SOPN, Inject 0.5 mg into the skin once a week., Disp: 3 mL, Rfl: 0   spironolactone  (ALDACTONE ) 25 MG tablet, Take 0.5 tablets (12.5 mg total) by mouth daily., Disp: 45 tablet, Rfl: 3

## 2023-08-05 ENCOUNTER — Ambulatory Visit

## 2023-08-05 DIAGNOSIS — I5022 Chronic systolic (congestive) heart failure: Secondary | ICD-10-CM

## 2023-08-05 DIAGNOSIS — I428 Other cardiomyopathies: Secondary | ICD-10-CM

## 2023-08-06 LAB — CUP PACEART REMOTE DEVICE CHECK
Battery Remaining Longevity: 64 mo
Battery Remaining Percentage: 59 %
Battery Voltage: 2.96 V
Brady Statistic RV Percent Paced: 0 %
Date Time Interrogation Session: 20250428045605
HighPow Impedance: 60 Ohm
Implantable Lead Connection Status: 753985
Implantable Lead Implant Date: 20210107
Implantable Lead Location: 753860
Implantable Pulse Generator Implant Date: 20201112
Lead Channel Impedance Value: 310 Ohm
Lead Channel Pacing Threshold Amplitude: 1.75 V
Lead Channel Pacing Threshold Pulse Width: 0.5 ms
Lead Channel Sensing Intrinsic Amplitude: 10.2 mV
Lead Channel Setting Pacing Amplitude: 3.5 V
Lead Channel Setting Pacing Pulse Width: 0.5 ms
Lead Channel Setting Sensing Sensitivity: 0.5 mV
Pulse Gen Serial Number: 111012702
Zone Setting Status: 755011

## 2023-08-11 ENCOUNTER — Encounter: Payer: Self-pay | Admitting: Cardiology

## 2023-08-12 ENCOUNTER — Ambulatory Visit: Attending: Cardiology

## 2023-08-12 DIAGNOSIS — Z95810 Presence of automatic (implantable) cardiac defibrillator: Secondary | ICD-10-CM | POA: Diagnosis not present

## 2023-08-12 DIAGNOSIS — I5022 Chronic systolic (congestive) heart failure: Secondary | ICD-10-CM

## 2023-08-13 ENCOUNTER — Telehealth: Payer: Self-pay

## 2023-08-13 NOTE — Telephone Encounter (Signed)
 Remote ICM transmission received.  Attempted call to patient regarding ICM remote transmission and no answer.

## 2023-08-13 NOTE — Progress Notes (Signed)
 EPIC Encounter for ICM Monitoring  Patient Name: Tara Bradshaw is a 38 y.o. female Date: 08/13/2023 Primary Care Physican: Patient, No Pcp Per Primary Cardiologist: Bensimhon Electrophysiologist: Marven Slimmer 03/01/2023 Office Weight: 261 lbs 07/10/2023 Weight: 261 lbs   NSVT 6                 Attempted call to patient and unable to reach.   Transmission results reviewed.    Corvue thoracic impedance suggesting normal fluid levels with the exception of possible fluid accumulation from 4/13-4/16.       Prescribed: Furosemide  40 mg take 1 tablet(s) (40 mg total) by mouth morning, noon and at bedtime.   Potassium 20 mEq take 2 tablet(s) (40 mg total) by mouth daily.   Labs: 06/14/2023 Creatinine 0.44, BUN 9, Potassium 3.7, Sodium 139, GFR <60, BNP 791.5 11/15/2022 Creatinine 0.79, BUN 9, Potassium 4.4, Sodium 136, GFR >60  A complete set of results can be found in Results Review.   Recommendations:  Unable to reach.     Follow-up plan: ICM clinic phone appointment on 09/16/2023.   91 day device clinic remote transmission 11/04/2023.       EP/Cardiology Office Visits:   10/22/2023 with HF clinic.    Recall 02/16/2024 with Michaelle Adolphus, PA.   Copy of ICM check sent to Dr. Marven Slimmer.   3 month ICM trend: 08/11/2023.    12-14 Month ICM trend:     Almyra Jain, RN 08/13/2023 9:07 AM

## 2023-08-14 ENCOUNTER — Encounter (HOSPITAL_COMMUNITY): Payer: Self-pay

## 2023-08-14 ENCOUNTER — Other Ambulatory Visit: Payer: Self-pay | Admitting: Medical Genetics

## 2023-08-15 ENCOUNTER — Other Ambulatory Visit (HOSPITAL_COMMUNITY)
Admission: RE | Admit: 2023-08-15 | Discharge: 2023-08-15 | Disposition: A | Discharge status: Home / Self Care | Payer: Self-pay | Source: Ambulatory Visit | Attending: Medical Genetics | Admitting: Medical Genetics

## 2023-08-21 ENCOUNTER — Encounter: Payer: Self-pay | Admitting: Family Medicine

## 2023-08-21 ENCOUNTER — Ambulatory Visit: Admitting: Family Medicine

## 2023-08-21 DIAGNOSIS — I5022 Chronic systolic (congestive) heart failure: Secondary | ICD-10-CM

## 2023-08-21 DIAGNOSIS — Z1159 Encounter for screening for other viral diseases: Secondary | ICD-10-CM

## 2023-08-21 DIAGNOSIS — Z6841 Body Mass Index (BMI) 40.0 and over, adult: Secondary | ICD-10-CM | POA: Diagnosis not present

## 2023-08-21 DIAGNOSIS — I429 Cardiomyopathy, unspecified: Secondary | ICD-10-CM

## 2023-08-21 DIAGNOSIS — E0590 Thyrotoxicosis, unspecified without thyrotoxic crisis or storm: Secondary | ICD-10-CM

## 2023-08-21 DIAGNOSIS — Z95810 Presence of automatic (implantable) cardiac defibrillator: Secondary | ICD-10-CM | POA: Diagnosis not present

## 2023-08-21 LAB — LIPID PANEL
Cholesterol: 194 mg/dL (ref 0–200)
HDL: 39.7 mg/dL (ref >39.00)
LDL Cholesterol: 142 mg/dL — ABNORMAL HIGH (ref 0–99)
NonHDL: 153.92
Total CHOL/HDL Ratio: 5
Triglycerides: 60 mg/dL (ref 0.0–149.0)
VLDL: 12 mg/dL (ref 0.0–40.0)

## 2023-08-21 LAB — COMPREHENSIVE METABOLIC PANEL WITH GFR
ALT: 33 U/L (ref 0–35)
AST: 21 U/L (ref 0–37)
Albumin: 4.4 g/dL (ref 3.5–5.2)
Alkaline Phosphatase: 49 U/L (ref 39–117)
BUN: 9 mg/dL (ref 6–23)
CO2: 27 meq/L (ref 19–32)
Calcium: 9.3 mg/dL (ref 8.4–10.5)
Chloride: 105 meq/L (ref 96–112)
Creatinine, Ser: 0.7 mg/dL (ref 0.40–1.20)
GFR: 108.79 mL/min (ref >60.00)
Glucose, Bld: 99 mg/dL (ref 70–99)
Potassium: 4 meq/L (ref 3.5–5.1)
Sodium: 138 meq/L (ref 135–145)
Total Bilirubin: 0.6 mg/dL (ref 0.2–1.2)
Total Protein: 7 g/dL (ref 6.0–8.3)

## 2023-08-21 LAB — CBC
HCT: 38.7 % (ref 36.0–46.0)
Hemoglobin: 12.4 g/dL (ref 12.0–15.0)
MCHC: 31.9 g/dL (ref 30.0–36.0)
MCV: 82.2 fl (ref 78.0–100.0)
Platelets: 208 10*3/uL (ref 150.0–400.0)
RBC: 4.71 Mil/uL (ref 3.87–5.11)
RDW: 14.4 % (ref 11.5–15.5)
WBC: 6.6 10*3/uL (ref 4.0–10.5)

## 2023-08-21 LAB — HEPATITIS C ANTIBODY: Hepatitis C Ab: NONREACTIVE

## 2023-08-21 LAB — TSH: TSH: 1 u[IU]/mL (ref 0.35–5.50)

## 2023-08-21 LAB — HEMOGLOBIN A1C: Hgb A1c MFr Bld: 6 % (ref 4.6–6.5)

## 2023-08-21 LAB — T4, FREE: Free T4: 0.78 ng/dL (ref 0.60–1.60)

## 2023-08-21 MED ORDER — GABAPENTIN 300 MG PO CAPS: 300.0000 mg | ORAL_CAPSULE | Freq: Every day | ORAL | 0 refills | Status: DC

## 2023-08-21 NOTE — Progress Notes (Signed)
 New Patient Office Visit  Subjective    Patient ID: Tara Bradshaw, female    DOB: 1985-06-28  Age: 38 y.o. MRN: [id number]  CC:  Chief Complaint  Patient presents with   Establish Care    Has congesive heart failure, sees cardiology and heart failure dr allred. Pacemaker difibulater on one side, barastan on other side. Having issues w the barastan, dr states she has a lot of nerve damage so is on gabapentin  for that. Wants to get that continued w PCP bc no longer seeing vein and vascular after surgery. Open to anything else if PCP wants to change gabapentin  but does state it helps at night but can't take during day due to drowsiness. (Pain in from device up to shoulder and neck)    HPI Tara Bradshaw presents to establish care Previous PCP- no one in years   Other providers: Cardiologist - Tara Bradshaw, [facility] OB/GYN- Tara Bradshaw  Vein and Vascular- Tara Bradshaw (released her)    CHF- started after her last pregnancy 5 years ago   ICD and barastem   States she has pain around the device. States they diagnosed her with nerve damage. Takes gabapentin  300 mg at bedtime for this.  States her cardiologist has been managing this but prefers I start managing.    OSA diagnosed in 2020 (AHI 15.3)  Widowed  2 kids Lives with her boyfriend, mother, grandmother.  She has a large family    Outpatient Encounter Medications as of 08/21/2023  Medication Sig   acetaminophen  (TYLENOL ) 325 MG tablet Take 650 mg by mouth every 6 (six) hours as needed for mild pain or headache.   dapagliflozin  propanediol (FARXIGA ) 10 MG TABS tablet Take 1 tablet (10 mg total) by mouth daily before breakfast.   furosemide  (LASIX ) 40 MG tablet Take 40 mg by mouth in the morning, at noon, and at bedtime.   levonorgestrel  (MIRENA ) 20 MCG/24HR IUD 1 each by Intrauterine route once.   meclizine  (ANTIVERT ) 25 MG tablet Take 1 tablet (25 mg total) by mouth 3 (three) times daily as needed for dizziness.    Multiple Vitamins-Minerals (WOMENS MULTIVITAMIN PO) Take 1 tablet by mouth daily.   NON FORMULARY Pt uses a cpap nightly   potassium chloride  SA (KLOR-CON  M) 20 MEQ tablet Take 2 tablets (40 mEq total) by mouth daily.   sacubitril -valsartan  (ENTRESTO ) 97-103 MG Take 1 tablet by mouth 2 (two) times daily.   spironolactone  (ALDACTONE ) 25 MG tablet Take 0.5 tablets (12.5 mg total) by mouth daily.   [DISCONTINUED] cyclobenzaprine  (FLEXERIL ) 10 MG tablet Take 1 tablet (10 mg total) by mouth 3 (three) times daily as needed for muscle spasms.   [DISCONTINUED] gabapentin  (NEURONTIN ) 300 MG capsule Take 1 capsule (300 mg total) by mouth daily.   [DISCONTINUED] Semaglutide ,0.25 or 0.5MG /DOS, 2 MG/3ML SOPN Inject 0.5 mg into the skin once a week.   gabapentin  (NEURONTIN ) 300 MG capsule Take 1 capsule (300 mg total) by mouth at bedtime.   No facility-administered encounter medications on file as of 08/21/2023.    Past Medical History:  Diagnosis Date   AICD (automatic cardioverter/defibrillator) present    Allergy    Anemia    CHRONIC   CHF (congestive heart failure) (HCC)    Chronic systolic dysfunction of left ventricle    GERD (gastroesophageal reflux disease)    WITH PREGNANCY   Nonischemic cardiomyopathy (HCC)    Pneumonia    x 1   PVC's (premature ventricular contractions)    Sleep  apnea     Past Surgical History:  Procedure Laterality Date   CARDIAC CATHETERIZATION     DILATION AND EVACUATION N/A 11/30/2016   Procedure: DILATATION AND EVACUATION;  Surgeon: Tara Layer, MD;  Location: [facility];  Service: Gynecology;  Laterality: N/A;   ICD IMPLANT N/A 02/19/2019   Procedure: ICD IMPLANT;  Surgeon: Tara Needle, MD;  Location: [facility];  Service: Cardiovascular;  Laterality: N/A;   LEAD REVISION/REPAIR N/A 04/16/2019   Procedure: LEAD REVISION/REPAIR;  Surgeon: Tara Needle, MD;  Location: [facility];  Service: Cardiovascular;  Laterality: N/A;   RIGHT HEART CATH  N/A 04/24/2022   Procedure: RIGHT HEART CATH;  Surgeon: Tara Shade, MD;  Location: [facility];  Service: Cardiovascular;  Laterality: N/A;   RIGHT/LEFT HEART CATH AND CORONARY ANGIOGRAPHY N/A 01/22/2019   Procedure: RIGHT/LEFT HEART CATH AND CORONARY ANGIOGRAPHY;  Surgeon: Tara Shade, MD;  Location: [facility];  Service: Cardiovascular;  Laterality: N/A;   WISDOM TOOTH EXTRACTION     NO ANESTHESIA    Family History  Problem Relation Age of [place]   Epilepsy Mother    Asthma Mother    Vision loss Mother    Heart disease Father    Cancer Paternal Grandmother        BREAST   Cancer Maternal Aunt 62       BREAST   Hyperlipidemia Maternal Aunt    Hypertension Maternal Grandmother    Hearing loss Maternal Grandmother    Kidney disease Maternal Grandmother    Cancer Maternal Grandfather    Hearing loss Maternal Grandfather    Heart disease Paternal Grandfather    Hearing loss Maternal Uncle    Hearing loss Maternal Aunt     Social History   Socioeconomic History   Marital status: Widowed    Spouse name: Not on file   Number of children: Not on file   Years of education:  14   Highest education level: Associate degree: occupational, Scientist, product/process development, or vocational program  Occupational History   Occupation: SECURITY OFFICER    Employer: [employer]  Tobacco Use   Smoking status: Never    Passive exposure: Never   Smokeless tobacco: Never  Vaping Use   Vaping status: Never Used  Substance and Sexual Activity   Alcohol use: No   Drug use: No   Sexual activity: Yes    Partners: Male    Birth control/protection: I.U.D.  Other Topics Concern   Not on file  Social History Narrative   Lives in [place] with husband and children (6,1)   Unemployed    Previously worked in Office manager.   Social Drivers of Corporate investment banker Strain: Low Risk  (08/19/2023)   Overall Financial Resource Strain (CARDIA)    Difficulty of Paying  Living Expenses: Not hard at all  Food Insecurity: No Food Insecurity (08/19/2023)   Hunger Vital Sign    Worried About Running Out of Food in the Last Year: Never true    Ran Out of Food in the Last Year: Never true  Transportation Needs: No Transportation Needs (08/19/2023)   PRAPARE - Administrator, Civil Service (Medical): No    Lack of Transportation (Non-Medical): No  Physical Activity: Unknown (08/19/2023)   Exercise Vital Sign    Days of Exercise per Week: 0 days    Minutes of Exercise per Session: Not on file  Stress: No Stress Concern Present (08/19/2023)  [employer] of Occupational Health - Occupational Stress Questionnaire    Feeling of Stress : Not at all  Social Connections: Moderately Integrated (08/19/2023)   Social Connection and Isolation Panel [NHANES]    Frequency of Communication with Friends and Family: More than three times a week    Frequency of Social Gatherings with Friends and Family: More than three times a week    Attends Religious Services: More than 4 times per year    Active Member of [employer] or Organizations: Yes    Attends Banker Meetings: More than 4 times per year    Marital Status: Widowed  Intimate Partner Violence: Not At Risk (12/05/2017)   Humiliation, Afraid, Rape, and Kick questionnaire    Fear of Current or Ex-Partner: No    Emotionally Abused: No    Physically Abused: No    Sexually Abused: No    Review of Systems  Constitutional:  Positive for malaise/fatigue. Negative for chills, fever and weight loss.  Eyes:  Negative for blurred vision and double vision.  Respiratory:  Negative for cough, shortness of breath and wheezing.   Cardiovascular:  Negative for chest pain, palpitations, orthopnea and leg swelling.  Gastrointestinal:  Negative for abdominal pain, constipation, diarrhea, nausea and vomiting.  Genitourinary:  Negative for dysuria, frequency and urgency.  Neurological:  Negative for dizziness, focal  weakness and headaches.  Psychiatric/Behavioral:  Negative for depression. The patient is not nervous/anxious.         Objective    BP 110/74 (BP Location: Left Arm, Patient Position: Sitting)   Pulse 88   Temp 97.8 F (36.6 C) (Temporal)   Ht 5\' 2"  (1.575 m)   Wt 260 lb (117.9 kg)   SpO2 98%   BMI 47.55 kg/m   Physical Exam Constitutional:      General: She is not in acute distress.    Appearance: She is not ill-appearing.  HENT:     Mouth/Throat:     Mouth: Mucous membranes are moist.  Eyes:     Extraocular Movements: Extraocular movements intact.     Conjunctiva/sclera: Conjunctivae normal.  Cardiovascular:     Rate and Rhythm: Normal rate and regular rhythm.  Pulmonary:     Effort: Pulmonary effort is normal.     Breath sounds: Normal breath sounds.  Musculoskeletal:     Cervical back: Normal range of motion and neck supple.     Right lower leg: No edema.     Left lower leg: No edema.  Skin:    General: Skin is warm and dry.  Neurological:     General: No focal deficit present.     Mental Status: She is alert and oriented to person, place, and time.     Cranial Nerves: No cranial nerve deficit.     Motor: No weakness.     Coordination: Coordination normal.     Gait: Gait normal.  Psychiatric:        Mood and Affect: Mood normal.        Behavior: Behavior normal.        Thought Content: Thought content normal.         Assessment & Plan:   Problem List Items Addressed This Visit     Cardiomyopathy (HCC)   ICD (implantable cardioverter-defibrillator) in place   Morbid obesity (HCC) - Primary   Relevant Orders   CBC (Completed)   Comprehensive metabolic panel with GFR (Completed)   Hemoglobin A1c (Completed)   Lipid panel (Completed)  TSH (Completed)   T4, free (Completed)   Amb Ref to Medical Weight Management   Subclinical hyperthyroidism   Relevant Orders   TSH (Completed)   T4, free (Completed)   Other Visit Diagnoses       Chronic  systolic heart failure (HCC)       Relevant Orders   CBC (Completed)   Comprehensive metabolic panel with GFR (Completed)   Amb Ref to Medical Weight Management     Encounter for screening for other viral diseases       Relevant Orders   Hepatitis C antibody (Completed)      She is a pleasant 38 year old female who is new to the practice and here to establish care. Reviewed notes from cardiology. Refilled gabapentin  for pain related to implanted device. Euvolemic today. She would like help with weight loss. Check labs to look for underlying etiology or complications related to obesity. Referral to weight management Follow-up pending lab results or in 3 months  Return in about 3 months (around 11/21/2023) for chronic health conditions.   Alyson Back, NP-C

## 2023-08-21 NOTE — Patient Instructions (Signed)
 Please go downstairs for labs before you leave.   You will hear from [facility] to schedule.   I will see you back in 3 months or sooner if needed.

## 2023-08-22 ENCOUNTER — Encounter (INDEPENDENT_AMBULATORY_CARE_PROVIDER_SITE_OTHER): Payer: Self-pay

## 2023-08-26 ENCOUNTER — Ambulatory Visit: Payer: Self-pay | Admitting: Family Medicine

## 2023-08-26 LAB — GENECONNECT MOLECULAR SCREEN: Genetic Analysis Overall Interpretation: NEGATIVE

## 2023-08-26 NOTE — Progress Notes (Signed)
 She preferred a call with her results but I will also send to her Mychart.  Her LDL (the bad cholesterol) is elevated so I recommend that she cut back on foods high in saturated fat, animal products and fried food.  Thyroid  labs are normal.  Hemoglobin A1c is 6.0% and she has prediabetes.  Cut back on sugar and carbohydrates (potatoes, rice, pasta, bread).  Has she heard from [facility] yet?

## 2023-08-30 ENCOUNTER — Ambulatory Visit: Payer: 59

## 2023-09-16 ENCOUNTER — Ambulatory Visit: Attending: Cardiology

## 2023-09-16 DIAGNOSIS — I5022 Chronic systolic (congestive) heart failure: Secondary | ICD-10-CM

## 2023-09-16 DIAGNOSIS — Z95810 Presence of automatic (implantable) cardiac defibrillator: Secondary | ICD-10-CM

## 2023-09-17 NOTE — Progress Notes (Signed)
 EPIC Encounter for ICM Monitoring  Patient Name: Tara Bradshaw is a 38 y.o. female Date: 09/17/2023 Primary Care Physican: Abram Abraham, NP-C Primary Cardiologist: Bensimhon Electrophysiologist: Marven Slimmer 03/01/2023 Office Weight: 261 lbs 07/10/2023 Weight: 261 lbs 09/17/2023 Weight: 268 lbs   NSVT 9                 Spoke with patient and heart failure questions reviewed.  Transmission results reviewed.  Pt reports swelling in ankles, 3-4 lb weight gain and SOB over the last few days.  She did eat at restaurants past weekend for family celebrations.  She took extra Lasix  last night and will take another one this afternoon.  She is feeling much better this morning and SOB is almost resolved after taking extra Lasix  last night   Corvue thoracic impedance suggesting possible fluid accumulation starting 6/3 and returned close to normal 6/10 after taking extra Lasix  last night, 6/9.       Prescribed: Furosemide  40 mg take 1 tablet(s) (40 mg total) by mouth morning, noon and at bedtime.   Potassium 20 mEq take 2 tablet(s) (40 mg total) by mouth daily.   Labs: 08/21/2023 Creatinine 0.70, BUN 9, Potassium 4.0, Sodium 138, GFR 108.79 06/14/2023 Creatinine 0.44, BUN 9, Potassium 3.7, Sodium 139, GFR <60, BNP 791.5 11/15/2022 Creatinine 0.79, BUN 9, Potassium 4.4, Sodium 136, GFR >60  A complete set of results can be found in Results Review.   Recommendations:  Pt adjusts Lasix  as needed depending on sx.  Encouraged to call if fluid symptoms do not resolve after taking extra Lasix .   Follow-up plan: ICM clinic phone appointment on 10/28/2023.   91 day device clinic remote transmission 11/04/2023.       EP/Cardiology Office Visits:   10/22/2023 with HF clinic.    Recall 02/16/2024 with Michaelle Adolphus, PA.   Copy of ICM check sent to Dr. Marven Slimmer.   3 month ICM trend: 09/17/2023.    12-14 Month ICM trend:     Almyra Jain, RN 09/17/2023 2:08 PM

## 2023-09-19 NOTE — Addendum Note (Signed)
 Addended by: Edra Govern D on: 09/19/2023 10:14 AM   Modules accepted: Orders

## 2023-09-19 NOTE — Progress Notes (Signed)
 Remote ICD transmission.

## 2023-10-07 ENCOUNTER — Encounter (INDEPENDENT_AMBULATORY_CARE_PROVIDER_SITE_OTHER): Payer: Self-pay

## 2023-10-18 NOTE — Progress Notes (Incomplete)
 [facility] Note PCP: Lendia Boby CROME, NP-C HF Cardiologist: Dr. Cherrie   HPI: Tara Bradshaw is a 38 y.o. female w/ nonischemic cardiomyopathy s/p SICD, HTN and systolic heart failure diagnosed in 06/26/21 w/ LVEF 20-25% , and s/p barostim 10/23.  Admitted in 3/20 with acute systolic HF with EF 20-25% in setting of recent viral illness and 49-month post-partum status. COVID testing negative.   ZioPatch 8/20 NSR. Occasional PVCs (3.5%) 9-beat run NSVT    Had return visit w/ Dr. Cherrie 01/15/19. Endorsed fatigue after starting carvedilol .    R/LHC on 01/22/19 which showed severe NICM EF 20%, Normal coronaries, Well compensated hemodynamics with high cardiac output.    CPX 12/21 FVC 2.68 (93%)      FEV1 2.27 (93%)        FEV1/FVC 85 (100%)        MVV 84 (84%)    Resting HR: 70 Standing HR: 70 Peak HR: 135   (73% age predicted max HR)   BP rest: 148/94 Standing BP: 126/92 BP peak: 166/84  Peak VO2: 14.9 (76% predicted peak VO2) When adjusted to the patient's ideal body weight of 127.7 lb (57.9 kg) the peak VO2 is 29.4 ml/kg (ibw)/min (91% of the ibw-adjusted predicted).  VE/VCO2 slope:  27  Peak RER: 0.95   Follow up 1/24, volume overloaded with worse NYHA III symptoms, despite 1 week of increased diuresis. Arranged for RHC which showed RA 13, PA 49/33 (41), Fick CO/CI 6.3/3, Thermo CO/CI 6.4/3, PVR 2.3, PAPi 2.5. Biventricular heart failure with elevated filling pressures. Preserved CO.   Zio (2/24) showed mostly NSR with 12.5% PVC burden, 45 runs of NSVT  Follow up 5/24, volume up and weight up, REDs 47%. Instructed to take metolazone  2.5/40 KCL x 2 days and increase torsemide  to 40 bid. Anemia panel showed iron deficient and arranged for iron infusion.  Admitted 11/10/22 with multifocal pneumonia.Negative SARS2.  Treated with antibiotics. HF meds cut back due to soft BP, Toprol  XL 12.5 mg daily. Metolazone  and spironolactone  stopped.   Here for routine  follow-up. Has good days and bad days. On good days she is quite active. On bad days quite fatigued and takes a while to recover. Fluid up and down. Unable to tolerate torsemide  so switched back to lasix . Now taking lasix  40 tid. Takes extra as needed (maybe once a week). BP ok. No longer taking Ozempic  due to insurance coverage. No CP, orthopnea or PND.   Cardiac Studies - Zio (2/24): NSR avg HR 82 bpm, 45 runs NSVT (longest 8 beats), 2 runs SVT (5 beats max), 12.5% PVCs  - RHC (1/24): RA 13, PA 49/33 (41), PCWP 26, CO/CI (Fick): 6.3/3, PVR 2.3, PAPi 2.5.   - Echo (7/23): EF 25%-30%, severely dilated LV; RV mildly enlarged.   - CPX (03/17/20):  Submax test. No evidence of significant cardiopulmonary limitation despite LV dysfunction. When corrected to ideal body weight, measured pVO2 is normal. Suspect majority of limitation related to obesity.   - Echo (12/21): EF 25-30%  - Echo (10/20): EF 25-30%, (read as 30-35%)  Past Medical History:  Diagnosis Date   AICD (automatic cardioverter/defibrillator) present    Allergy    Anemia    CHRONIC   CHF (congestive heart failure) (HCC)    Chronic systolic dysfunction of left ventricle    GERD (gastroesophageal reflux disease)    WITH PREGNANCY   Nonischemic cardiomyopathy (HCC)    Pneumonia    x 1   PVC's (  premature ventricular contractions)    Sleep apnea    Current Outpatient Medications  Medication Sig Dispense Refill   acetaminophen  (TYLENOL ) 325 MG tablet Take 650 mg by mouth every 6 (six) hours as needed for mild pain or headache.     dapagliflozin  propanediol (FARXIGA ) 10 MG TABS tablet Take 1 tablet (10 mg total) by mouth daily before breakfast. 90 tablet 3   furosemide  (LASIX ) 40 MG tablet Take 40 mg by mouth in the morning, at noon, and at bedtime.     gabapentin  (NEURONTIN ) 300 MG capsule Take 1 capsule (300 mg total) by mouth at bedtime. 90 capsule 0   levonorgestrel  (MIRENA ) 20 MCG/24HR IUD 1 each by Intrauterine route once.      meclizine  (ANTIVERT ) 25 MG tablet Take 1 tablet (25 mg total) by mouth 3 (three) times daily as needed for dizziness. 30 tablet 0   Multiple Vitamins-Minerals (WOMENS MULTIVITAMIN PO) Take 1 tablet by mouth daily.     NON FORMULARY Pt uses a cpap nightly     potassium chloride  SA (KLOR-CON  M) 20 MEQ tablet Take 2 tablets (40 mEq total) by mouth daily.     sacubitril -valsartan  (ENTRESTO ) 97-103 MG Take 1 tablet by mouth 2 (two) times daily. 180 tablet 3   spironolactone  (ALDACTONE ) 25 MG tablet Take 0.5 tablets (12.5 mg total) by mouth daily. 45 tablet 3   No current facility-administered medications for this visit.   No Known Allergies  Social History   Socioeconomic History   Marital status: Widowed    Spouse name: Not on file   Number of children: Not on file   Years of education:  14   Highest education level: Associate degree: occupational, Scientist, product/process development, or vocational program  Occupational History   Occupation: SECURITY OFFICER    Employer: [employer]  Tobacco Use   Smoking status: Never    Passive exposure: Never   Smokeless tobacco: Never  Vaping Use   Vaping status: Never Used  Substance and Sexual Activity   Alcohol use: No   Drug use: No   Sexual activity: Yes    Partners: Male    Birth control/protection: I.U.D.  Other Topics Concern   Not on file  Social History Narrative   Lives in [place] with husband and children (6,1)   Unemployed    Previously worked in Office manager.   Social Drivers of Corporate investment banker Strain: Low Risk  (08/19/2023)   Overall Financial Resource Strain (CARDIA)    Difficulty of Paying Living Expenses: Not hard at all  Food Insecurity: No Food Insecurity (08/19/2023)   Hunger Vital Sign    Worried About Running Out of Food in the Last Year: Never true    Ran Out of Food in the Last Year: Never true  Transportation Needs: No Transportation Needs (08/19/2023)   PRAPARE - Scientist, research (physical sciences) (Medical): No    Lack of Transportation (Non-Medical): No  Physical Activity: Unknown (08/19/2023)   Exercise Vital Sign    Days of Exercise per Week: 0 days    Minutes of Exercise per Session: Not on file  Stress: No Stress Concern Present (08/19/2023)   [employer] of Occupational Health - Occupational Stress Questionnaire    Feeling of Stress : Not at all  Social Connections: Moderately Integrated (08/19/2023)   Social Connection and Isolation Panel    Frequency of Communication with Friends and Family: More than three times a week    Frequency of Social  Gatherings with Friends and Family: More than three times a week    Attends Religious Services: More than 4 times per year    Active Member of Clubs or Organizations: Yes    Attends Banker Meetings: More than 4 times per year    Marital Status: Widowed  Intimate Partner Violence: Not At Risk (12/05/2017)   Humiliation, Afraid, Rape, and Kick questionnaire    Fear of Current or Ex-Partner: No    Emotionally Abused: No    Physically Abused: No    Sexually Abused: No   Family History  Problem Relation Age of Onset   Epilepsy Mother    Asthma Mother    Vision loss Mother    Heart disease Father    Cancer Paternal Grandmother        BREAST   Cancer Maternal Aunt 52       BREAST   Hyperlipidemia Maternal Aunt    Hypertension Maternal Grandmother    Hearing loss Maternal Grandmother    Kidney disease Maternal Grandmother    Cancer Maternal Grandfather    Hearing loss Maternal Grandfather    Heart disease Paternal Grandfather    Hearing loss Maternal Uncle    Hearing loss Maternal Aunt    There were no vitals taken for this visit.  Wt Readings from Last 3 Encounters:  08/21/23 117.9 kg (260 lb)  07/10/23 118.8 kg (262 lb)  06/21/23 119.7 kg (264 lb)   Physical exam:  General:  Well appearing. No resp difficulty HEENT: normal Neck: supple. no JVD. Carotids 2+ bilat; no bruits. No  lymphadenopathy or thryomegaly appreciated. Cor: PMI nondisplaced. Regular rate & rhythm. No rubs, gallops or murmurs. Lungs: clear Abdomen: obese soft, nontender, nondistended. No hepatosplenomegaly. No bruits or masses. Good bowel sounds. Extremities: no cyanosis, clubbing, rash, edema Neuro: alert & orientedx3, cranial nerves grossly intact. moves all 4 extremities w/o difficulty. Affect pleasant  Device interrogation: No VT/AF. Fluid ok Personally reviewed  ASSESSMENT & PLAN: Chronic HFrEF:  - Diagnosed 3/20. Echo LVEF 25% with moderate RV dysfunction - NICM. Either viral (had URI in week prior to admission and respiratory panel + rhinovirus) or post-partum or PVC cardiomyopathy.  - LHC 10/20 showed normal cors. RHC showed well compensated hemodynamics with high cardiac output. No evidence of intracardiac shunting.  - Echo 10/20 EF 25-30% - Echo 12/21 EF 25-30% - Echo 10/09/21 EF 25-30% Personally reviewed - CPX 1/22 Peak VO2: 14.9 (76% predicted peak VO2) adjusted to ibw pVO2 is 29.4 ml/kg (ibw)/min (91% of the ibw-adjusted predicted). VE/VCO2 slope:  27 - S/p barostim 10/23 - RHC (1/24): RA 13, PA 49/33 (41), Fick CO/CI 6.3/3, Thermo CO/CI 6.4/3, PVR 2.3, PAPi 2.5. Biventricular heart failure with elevated filling pressures. Preserved CO.  - Zio 2/24 NSR avg HR 82 bpm, 45 runs NSVT (longest 8 beats), 2 runs SVT (5 beats max), 12.5% PVCs - NYHA II-III - Volume ok  - Continue Farxiga  10 mg daily. - Continue Entresto  97/103 mg bid - Continue Toprol  XL 12.5 mg at bedtime.  - Start spiro 12.5mg  daily - Decreased kcl to 40 daily - Labs today and 2 weeks - Due for repeat echo    2. HTN -Blood pressure well controlled. Continue current regimen.  3. PVCs:  - Zio (2/24) showed 12.5 % PVC burden - Amio stopped 7/23 as PVC suppression did not improve EF and favor not exposing her to long-term side effects  4. OSA - Moderate on sleep study (4/20) - Continue  CPAP  5. Obesity - There  is no height or weight on file to calculate BMI. - Off GLP1RA due to insurance - needs weight loss  Harlene CHRISTELLA Gainer MD  10/18/23 4:25 PM   [facility] [address] [facility] [place] [address] [phone] (office) [phone] (fax)

## 2023-10-21 ENCOUNTER — Telehealth (HOSPITAL_COMMUNITY): Payer: Self-pay | Admitting: *Deleted

## 2023-10-21 NOTE — Telephone Encounter (Signed)
 Called to confirm/remind patient of their appointment at the [facility] on 10/22/23.       Appointment:              [x] Confirmed             [] Left mess              [] No answer/No voice mail             [] Phone not in service   Patient reminded to bring all medications and/or complete list.   Confirmed patient has transportation. Gave directions, instructed to utilize valet parking.

## 2023-10-22 ENCOUNTER — Encounter (HOSPITAL_COMMUNITY): Payer: Self-pay

## 2023-10-22 ENCOUNTER — Ambulatory Visit (HOSPITAL_COMMUNITY)
Admission: RE | Admit: 2023-10-22 | Discharge: 2023-10-22 | Disposition: A | Discharge status: Home / Self Care | Source: Ambulatory Visit | Attending: Family Medicine

## 2023-10-22 ENCOUNTER — Ambulatory Visit (HOSPITAL_COMMUNITY): Payer: Self-pay | Admitting: Family Medicine

## 2023-10-22 VITALS — BP 110/78 | HR 82 | Ht 62.0 in | Wt 254.8 lb

## 2023-10-22 DIAGNOSIS — I493 Ventricular premature depolarization: Secondary | ICD-10-CM | POA: Diagnosis not present

## 2023-10-22 DIAGNOSIS — E669 Obesity, unspecified: Secondary | ICD-10-CM | POA: Diagnosis not present

## 2023-10-22 DIAGNOSIS — I081 Rheumatic disorders of both mitral and tricuspid valves: Secondary | ICD-10-CM | POA: Insufficient documentation

## 2023-10-22 DIAGNOSIS — R0601 Orthopnea: Secondary | ICD-10-CM | POA: Insufficient documentation

## 2023-10-22 DIAGNOSIS — Z79899 Other long term (current) drug therapy: Secondary | ICD-10-CM | POA: Diagnosis not present

## 2023-10-22 DIAGNOSIS — Z7984 Long term (current) use of oral hypoglycemic drugs: Secondary | ICD-10-CM | POA: Insufficient documentation

## 2023-10-22 DIAGNOSIS — I5022 Chronic systolic (congestive) heart failure: Secondary | ICD-10-CM

## 2023-10-22 DIAGNOSIS — I10 Essential (primary) hypertension: Secondary | ICD-10-CM | POA: Diagnosis not present

## 2023-10-22 DIAGNOSIS — I110 Hypertensive heart disease with heart failure: Secondary | ICD-10-CM | POA: Diagnosis present

## 2023-10-22 DIAGNOSIS — R5383 Other fatigue: Secondary | ICD-10-CM | POA: Diagnosis not present

## 2023-10-22 DIAGNOSIS — Z6841 Body Mass Index (BMI) 40.0 and over, adult: Secondary | ICD-10-CM | POA: Diagnosis not present

## 2023-10-22 DIAGNOSIS — I428 Other cardiomyopathies: Secondary | ICD-10-CM | POA: Diagnosis present

## 2023-10-22 DIAGNOSIS — G4733 Obstructive sleep apnea (adult) (pediatric): Secondary | ICD-10-CM

## 2023-10-22 LAB — BASIC METABOLIC PANEL WITH GFR
Anion gap: 7 (ref 5–15)
BUN: 6 mg/dL (ref 6–20)
CO2: 24 mmol/L (ref 22–32)
Calcium: 9.4 mg/dL (ref 8.9–10.3)
Chloride: 108 mmol/L (ref 98–111)
Creatinine, Ser: 0.67 mg/dL (ref 0.44–1.00)
GFR, Estimated: >60 mL/min (ref >60)
Glucose, Bld: 99 mg/dL (ref 70–99)
Potassium: 4.1 mmol/L (ref 3.5–5.1)
Sodium: 139 mmol/L (ref 135–145)

## 2023-10-22 LAB — CBC
HCT: 41 % (ref 36.0–46.0)
Hemoglobin: 13.4 g/dL (ref 12.0–15.0)
MCH: 26.6 pg (ref 26.0–34.0)
MCHC: 32.7 g/dL (ref 30.0–36.0)
MCV: 81.3 fL (ref 80.0–100.0)
Platelets: 232 K/uL (ref 150–400)
RBC: 5.04 MIL/uL (ref 3.87–5.11)
RDW: 15 % (ref 11.5–15.5)
WBC: 6.3 K/uL (ref 4.0–10.5)
nRBC: 0 % (ref 0.0–0.2)

## 2023-10-22 LAB — IRON AND TIBC
Iron: 66 ug/dL (ref 28–170)
Saturation Ratios: 17 % (ref 10.4–31.8)
TIBC: 396 ug/dL (ref 250–450)
UIBC: 330 ug/dL

## 2023-10-22 LAB — FERRITIN: Ferritin: 140 ng/mL (ref 11–307)

## 2023-10-22 LAB — BRAIN NATRIURETIC PEPTIDE: B Natriuretic Peptide: 774.2 pg/mL — ABNORMAL HIGH (ref 0.0–100.0)

## 2023-10-22 MED ORDER — POTASSIUM CHLORIDE CRYS ER 20 MEQ PO TBCR: 60.0000 meq | EXTENDED_RELEASE_TABLET | Freq: Every day | ORAL | 3 refills | Status: DC

## 2023-10-22 MED ORDER — FUROSEMIDE 40 MG PO TABS: 80.0000 mg | ORAL_TABLET | Freq: Two times a day (BID) | ORAL | 3 refills | Status: DC

## 2023-10-22 NOTE — Patient Instructions (Addendum)
 Medication Changes:  INCREASE LASIX  TO 80MG  TWICE DAILY   INCREASE POTASSIUM TO ONCE DAILY   Lab Work:  Labs done today, your results will be available in MyChart, we will contact you for abnormal readings.  THEN RETURN FOR LABS AGAIN IN 2 WEEK AS SCHEDULED   Testing/Procedures:  CPX INSTRUCTIONS  Expect to be in the lab for 2 hours. Please plan to arrive 30 minutes prior to your appointment. You may be asked to reschedule your test if you arrive 20 minutes or more after your scheduled appointment time.  Main Campus address: [address] [place], [place] [address] You may arrive to the Main Entrance A or Entrance C (free valet parking is available at both). -Main Entrance A (on [address]) :proceed to admitting for check in -Entrance C (on [facility]): proceed to [employer] parking or under hospital deck parking using this code _________  Check In: [facility] waiting room (1st floor)   General Instructions for the day of the test (Please follow all instructions from your physician): Refrain from ingesting a heavy meal, alcohol, or caffeine or using tobacco products within 2 hours of the test (DO NOT FAST for mare than 8 hours). You may have all other non-alcoholic, non -caffeinated beverage,a light snack (crackers,a piece of fruit, carrot sticks, toast bagel,etc) up to your appointment. Avoid significant exertion or exercise within 24 hours of your test. Be prepared to exercise and sweat. Your clothing should permit freedom of movement and include walking or running shoes. Women bring loose fitting short sleeved blouse.  This evaluation may be fatiguing and you may wish ti have someone accompany you to the assessment to drive you home afterward. Bring a list of your medications with you, including dosage and frequency you take the medications (  I.e.,once per day, twice per day, etc). Take all medications as prescribed, unless noted below or instructed to do so by  your physician.  Please do not take the following medications prior to your CPX:  _________________________________________________  _________________________________________________  Brief description of the test: A brief lung test will be performed. This will involve you taking deep breaths and blowing hard and fast through your mouth. During these , a clip will be on your nose and you will be breathing through a breathing device.   For the exercise portion of the test you will be walking on a treadmill, or riding a stationary bike, to your maximal effor or until symptoms such as chest pain, shortness of breath, leg pain or dizziness limit your exercise. You will be breathing in and out of a breathing device through your mouth (a clip will be on your nose again). Your heart rate, ECG, blood pressure, oxygen saturations, breathing rate and depth, amount of oxygen you consume and amount of carbon dioxide you produce will be measured and monitored throughout the exercise test.  If you need to cancel or reschedule your appointment please call [phone] If you have further questions please call your physician or Josette Pesa, MS, ACSM-RCEP at [phone]   Referrals:  YOU HAVE BEEN REFERRED TO PHARMACY CLINIC FOR GLP-1 WEIGHT LOSS MEDICATION THEY WILL REACH OUT TO YOU OR CALL TO ARRANGE THIS. PLEASE CALL US  WITH ANY CONCERNS   Follow-Up in: 3 MONTHS PLEASE CALL OUR OFFICE AROUND SEPTEMBER TO GET SCHEDULED FOR YOUR APPOINTMENT. PHONE NUMBER IS [phone] OPTION 2   At the [facility], you and your health needs are our priority. We have a  designated team specialized in the treatment of Heart Failure. This Care Team includes your primary Heart Failure Specialized Cardiologist (physician), Advanced Practice Providers (APPs- Physician Assistants and Nurse Practitioners), and Pharmacist who all work together to provide you with the care you need, when you need it.   You may see  any of the following providers on your designated Care Team at your next follow up:  Dr. Toribio Fuel Dr. Ezra Shuck Dr. Ria Commander Dr. Odis Brownie Greig Mosses, NP Caffie Shed, [place] [facility] [place], [place] Beckey Coe, NP [place] Lee, NP Tinnie Redman, PharmD   Please be sure to bring in all your medications bottles to every appointment.   Need to Contact Us :  If you have any questions or concerns before your next appointment please send us  a message through [place] or call our office at [phone].    TO LEAVE A MESSAGE FOR THE NURSE SELECT OPTION 2, PLEASE LEAVE A MESSAGE INCLUDING: YOUR NAME DATE OF BIRTH CALL BACK NUMBER REASON FOR CALL**this is important as we prioritize the call backs  YOU WILL RECEIVE A CALL BACK THE SAME DAY AS LONG AS YOU CALL BEFORE 4:00 PM

## 2023-10-22 NOTE — Addendum Note (Signed)
 Encounter addended by: Glena Harlene HERO, FNP on: 10/22/2023 11:50 AM  Actions taken: Clinical Note Signed

## 2023-10-22 NOTE — Progress Notes (Signed)
 ReDS Vest / Clip - 10/22/23 1050       ReDS Vest / Clip   Station Marker B    Ruler Value 36    ReDS Value Range High volume overload    ReDS Actual Value 47

## 2023-10-24 ENCOUNTER — Ambulatory Visit: Admitting: Family Medicine

## 2023-10-24 VITALS — BP 114/82 | HR 76 | Temp 97.8°F | Ht 62.0 in | Wt 256.0 lb

## 2023-10-24 DIAGNOSIS — M722 Plantar fascial fibromatosis: Secondary | ICD-10-CM

## 2023-10-24 MED ORDER — GABAPENTIN 300 MG PO CAPS: 300.0000 mg | ORAL_CAPSULE | Freq: Every day | ORAL | 0 refills | Status: DC

## 2023-10-24 NOTE — Patient Instructions (Signed)
 Use ice massage   Stretch your toes (pull toward your head)  Wear only good supportive shoes   Avoid walking barefoot.   Try Tylenol  500 mg or 1,000 mg if needed.

## 2023-10-24 NOTE — Progress Notes (Signed)
 Subjective:     Patient ID: Tara Bradshaw, female    DOB: 21-Nov-1985, 38 y.o.   MRN: [id number]  Chief Complaint  Patient presents with   Foot Pain    Bottom of right foot pain, bothers her by end of the day after walking on it. Possible plantar facitits     Foot Pain Pertinent negatives include no chest pain, chills, fever, nausea or vomiting.    History of Present Illness          C/o right foot pain x 2 weeks. Heel pain radiating to her toes. No injury. Pain is worse first thing in the morning and in the evening after being on her feet.    Health Maintenance Due  Topic Date Due   Medicare Annual Wellness (AWV)  Never done   DTaP/Tdap/Td (1 - Tdap) Never done   Pneumococcal Vaccine 67-36 Years old (1 of 2 - PCV) Never done   Hepatitis B Vaccines (1 of 3 - 19+ 3-dose series) Never done   HPV VACCINES (1 - 3-dose SCDM series) Never done    Past Medical History:  Diagnosis Date   AICD (automatic cardioverter/defibrillator) present    Allergy    Anemia    CHRONIC   CHF (congestive heart failure) (HCC)    Chronic systolic dysfunction of left ventricle    GERD (gastroesophageal reflux disease)    WITH PREGNANCY   Nonischemic cardiomyopathy (HCC)    Pneumonia    x 1   PVC's (premature ventricular contractions)    Sleep apnea     Past Surgical History:  Procedure Laterality Date   CARDIAC CATHETERIZATION     DILATION AND EVACUATION N/A 11/30/2016   Procedure: DILATATION AND EVACUATION;  Surgeon: Fredirick Glenys RAMAN, MD;  Location: [facility];  Service: Gynecology;  Laterality: N/A;   ICD IMPLANT N/A 02/19/2019   Procedure: ICD IMPLANT;  Surgeon: Kelsie Agent, MD;  Location: [facility];  Service: Cardiovascular;  Laterality: N/A;   LEAD REVISION/REPAIR N/A 04/16/2019   Procedure: LEAD REVISION/REPAIR;  Surgeon: Kelsie Agent, MD;  Location: [facility];  Service: Cardiovascular;  Laterality: N/A;   RIGHT HEART CATH N/A 04/24/2022   Procedure: RIGHT HEART  CATH;  Surgeon: Cherrie Toribio SAUNDERS, MD;  Location: [facility];  Service: Cardiovascular;  Laterality: N/A;   RIGHT/LEFT HEART CATH AND CORONARY ANGIOGRAPHY N/A 01/22/2019   Procedure: RIGHT/LEFT HEART CATH AND CORONARY ANGIOGRAPHY;  Surgeon: Cherrie Toribio SAUNDERS, MD;  Location: [facility];  Service: Cardiovascular;  Laterality: N/A;   WISDOM TOOTH EXTRACTION     NO ANESTHESIA    Family History  Problem Relation Age of Onset   Epilepsy Mother    Asthma Mother    Vision loss Mother    Heart disease Father    Cancer Paternal Grandmother        BREAST   Cancer Maternal Aunt 7       BREAST   Hyperlipidemia Maternal Aunt    Hypertension Maternal Grandmother    Hearing loss Maternal Grandmother    Kidney disease Maternal Grandmother    Cancer Maternal Grandfather    Hearing loss Maternal Grandfather    Heart disease Paternal Grandfather    Hearing loss Maternal Uncle    Hearing loss Maternal Aunt     Social History   Socioeconomic History   Marital status: Widowed    Spouse name: Not on file   Number of children: Not on file   Years  of education:  14   Highest education level: Associate degree: occupational, Scientist, product/process development, or vocational program  Occupational History   Occupation: SECURITY OFFICER    Employer: [employer]  Tobacco Use   Smoking status: Never    Passive exposure: Never   Smokeless tobacco: Never  Vaping Use   Vaping status: Never Used  Substance and Sexual Activity   Alcohol use: No   Drug use: No   Sexual activity: Yes    Partners: Male    Birth control/protection: I.U.D.  Other Topics Concern   Not on file  Social History Narrative   Lives in [place] with husband and children (6,1)   Unemployed    Previously worked in Office manager.   Social Drivers of Corporate investment banker Strain: Low Risk  (10/24/2023)   Overall Financial Resource Strain (CARDIA)    Difficulty of Paying Living Expenses: Not hard at all  Food  Insecurity: No Food Insecurity (10/24/2023)   Hunger Vital Sign    Worried About Running Out of Food in the Last Year: Never true    Ran Out of Food in the Last Year: Never true  Transportation Needs: No Transportation Needs (10/24/2023)   PRAPARE - Administrator, Civil Service (Medical): No    Lack of Transportation (Non-Medical): No  Physical Activity: Inactive (10/24/2023)   Exercise Vital Sign    Days of Exercise per Week: 0 days    Minutes of Exercise per Session: Not on file  Stress: No Stress Concern Present (10/24/2023)   [employer] of Occupational Health - Occupational Stress Questionnaire    Feeling of Stress: Not at all  Social Connections: Moderately Integrated (10/24/2023)   Social Connection and Isolation Panel    Frequency of Communication with Friends and Family: More than three times a week    Frequency of Social Gatherings with Friends and Family: More than three times a week    Attends Religious Services: 1 to 4 times per year    Active Member of [employer] or Organizations: Yes    Attends Banker Meetings: 1 to 4 times per year    Marital Status: Widowed  Intimate Partner Violence: Not At Risk (12/05/2017)   Humiliation, Afraid, Rape, and Kick questionnaire    Fear of Current or Ex-Partner: No    Emotionally Abused: No    Physically Abused: No    Sexually Abused: No    Outpatient Medications Prior to Visit  Medication Sig Dispense Refill   acetaminophen  (TYLENOL ) 325 MG tablet Take 650 mg by mouth every 6 (six) hours as needed for mild pain or headache.     dapagliflozin  propanediol (FARXIGA ) 10 MG TABS tablet Take 1 tablet (10 mg total) by mouth daily before breakfast. 90 tablet 3   furosemide  (LASIX ) 40 MG tablet Take 2 tablets (80 mg total) by mouth 2 (two) times daily. 120 tablet 3   levonorgestrel  (MIRENA ) 20 MCG/24HR IUD 1 each by Intrauterine route once.     meclizine  (ANTIVERT ) 25 MG tablet Take 1 tablet (25 mg total) by mouth 3  (three) times daily as needed for dizziness. 30 tablet 0   Multiple Vitamins-Minerals (WOMENS MULTIVITAMIN PO) Take 1 tablet by mouth daily.     NON FORMULARY Pt uses a cpap nightly     potassium chloride  SA (KLOR-CON  M) 20 MEQ tablet Take 3 tablets (60 mEq total) by mouth daily. 90 tablet 3   sacubitril -valsartan  (ENTRESTO ) 97-103 MG Take 1 tablet by mouth 2 (  two) times daily. 180 tablet 3   spironolactone  (ALDACTONE ) 25 MG tablet Take 0.5 tablets (12.5 mg total) by mouth daily. 45 tablet 3   gabapentin  (NEURONTIN ) 300 MG capsule Take 1 capsule (300 mg total) by mouth at bedtime. 90 capsule 0   No facility-administered medications prior to visit.    No Known Allergies  Review of Systems  Constitutional:  Negative for chills and fever.  Respiratory:  Negative for shortness of breath.   Cardiovascular:  Negative for chest pain, palpitations and leg swelling.  Gastrointestinal:  Negative for diarrhea, nausea and vomiting.  Musculoskeletal:  Positive for joint pain.       Right foot pain  Neurological:  Negative for dizziness, tingling and focal weakness.       Objective:    Physical Exam Constitutional:      General: She is not in acute distress.    Appearance: She is not ill-appearing.  Eyes:     Extraocular Movements: Extraocular movements intact.     Conjunctiva/sclera: Conjunctivae normal.  Cardiovascular:     Rate and Rhythm: Normal rate.  Pulmonary:     Effort: Pulmonary effort is normal.  Musculoskeletal:     Cervical back: Normal range of motion and neck supple.     Right lower leg: No edema.     Left lower leg: No edema.     Right ankle: Normal.     Right foot: Normal range of motion and normal capillary refill. Tenderness present. No swelling, deformity or crepitus. Normal pulse.     Comments: TTP of plantar surface near the heel. Otherwise unremarkable foot exam   Skin:    General: Skin is warm and dry.  Neurological:     General: No focal deficit present.      Mental Status: She is alert and oriented to person, place, and time.     Sensory: No sensory deficit.     Motor: No weakness.     Coordination: Coordination normal.     Gait: Gait normal.  Psychiatric:        Mood and Affect: Mood normal.        Behavior: Behavior normal.        Thought Content: Thought content normal.      BP 114/82 (BP Location: Left Arm, Patient Position: Sitting)   Pulse 76   Temp 97.8 F (36.6 C) (Temporal)   Ht 5' 2 (1.575 m)   Wt 256 lb (116.1 kg)   SpO2 99%   BMI 46.82 kg/m  Wt Readings from Last 3 Encounters:  10/24/23 256 lb (116.1 kg)  10/22/23 254 lb 12.8 oz (115.6 kg)  08/21/23 260 lb (117.9 kg)       Assessment & Plan:   Problem List Items Addressed This Visit   None Visit Diagnoses       Plantar fasciitis of right foot    -  Primary      Counseling on conservative treatment for symptoms. Recommend ice massage, stretching and wearing good supportive shoes all of the time.  Avoid flip-flops, sandals and going barefoot.  She cannot take NSAIDs so she may try Tylenol  as needed.  Refilled gabapentin . Follow-up if worsening or if not improving in the next 2-4 weeks   I am having Namiko Kimble maintain her levonorgestrel , acetaminophen , NON FORMULARY, Multiple Vitamins-Minerals (WOMENS MULTIVITAMIN PO), dapagliflozin  propanediol, Entresto , meclizine , spironolactone , furosemide , potassium chloride  SA, and gabapentin .  Meds ordered this encounter  Medications   gabapentin  (NEURONTIN ) 300 MG capsule  Sig: Take 1 capsule (300 mg total) by mouth at bedtime.    Dispense:  90 capsule    Refill:  0

## 2023-10-28 ENCOUNTER — Ambulatory Visit: Attending: Cardiology

## 2023-10-28 DIAGNOSIS — Z95810 Presence of automatic (implantable) cardiac defibrillator: Secondary | ICD-10-CM | POA: Diagnosis not present

## 2023-10-28 DIAGNOSIS — I5022 Chronic systolic (congestive) heart failure: Secondary | ICD-10-CM | POA: Diagnosis not present

## 2023-10-29 ENCOUNTER — Other Ambulatory Visit: Payer: Self-pay

## 2023-10-29 NOTE — Telephone Encounter (Unsigned)
 Copied from CRM [id number]. Topic: Clinical - Medication Prior Auth >> Oct 29, 2023  5:13 PM Deleta S wrote: Reason for CRM: patient needs authorization to get the medication refill due to the medication being sent in before the time of the prescription refill date. Gabapentin  300 mg capsule

## 2023-10-30 MED ORDER — GABAPENTIN 300 MG PO CAPS: 300.0000 mg | ORAL_CAPSULE | Freq: Every day | ORAL | 0 refills | Status: DC

## 2023-10-30 NOTE — Progress Notes (Signed)
 EPIC Encounter for ICM Monitoring  Patient Name: Tara Bradshaw is a 38 y.o. female Date: 10/30/2023 Primary Care Physican: Lendia Boby CROME, NP-C Primary Cardiologist: Bensimhon Electrophysiologist: Cindie 03/01/2023 Office Weight: 261 lbs 07/10/2023 Weight: 261 lbs 09/17/2023 Weight: 268 lbs 10/22/2023 Office Weight: 254 lbs   NSVT 1                Spoke with patient and heart failure questions reviewed.  Transmission results reviewed.  Pt asymptomatic for fluid accumulation.  Pt is in a hotel due to HVAC is not working at home.    Corvue thoracic impedance suggesting normal fluid levels within the last month.       Prescribed: Furosemide  40 mg take 2 tablet(s) (80 mg total) by mouth twice a day.   Potassium 20 mEq take 3 tablet(s) (60 mg total) by mouth daily.   Labs: 10/22/2023 Creatinine 0.67, BUN 6, Potassium 4.1, Sodium 139, GFR >60 08/21/2023 Creatinine 0.70, BUN 9, Potassium 4.0, Sodium 138, GFR 108.79 06/14/2023 Creatinine 0.44, BUN 9, Potassium 3.7, Sodium 139, GFR <60, BNP 791.5 11/15/2022 Creatinine 0.79, BUN 9, Potassium 4.4, Sodium 136, GFR >60  A complete set of results can be found in Results Review.   Recommendations:  No changes and encouraged to call if experiencing any fluid symptoms.   Follow-up plan: ICM clinic phone appointment on 12/02/2023.   91 day device clinic remote transmission 02/03/2024.       EP/Cardiology Office Visits:   11/07/2023 with HF clinic.    Recall 02/16/2024 with Jodie Passey, PA.   Copy of ICM check sent to Dr. Cindie.   3 month ICM trend: 10/28/2023.    12-14 Month ICM trend:     Mitzie GORMAN Garner, RN 10/30/2023 3:37 PM

## 2023-11-04 ENCOUNTER — Ambulatory Visit

## 2023-11-05 ENCOUNTER — Other Ambulatory Visit (HOSPITAL_COMMUNITY)

## 2023-11-07 ENCOUNTER — Encounter (HOSPITAL_COMMUNITY)

## 2023-11-07 ENCOUNTER — Other Ambulatory Visit (HOSPITAL_COMMUNITY)

## 2023-11-14 ENCOUNTER — Telehealth (HOSPITAL_COMMUNITY): Payer: Self-pay | Admitting: Cardiology

## 2023-11-14 NOTE — Telephone Encounter (Signed)
-  patient called during 1:20 dental appointment Request clearance so she can proceed with procedure (extraction)  Clearance faxed on 8/4 (confirmed in the office)  Advised clearance was received and in process, turn around time 7-10 days.  Patients Asks that clearance be reviewed ASAP as she is in the office.

## 2023-11-18 ENCOUNTER — Other Ambulatory Visit (HOSPITAL_COMMUNITY)

## 2023-11-20 ENCOUNTER — Ambulatory Visit (HOSPITAL_COMMUNITY)

## 2023-11-20 ENCOUNTER — Ambulatory Visit (HOSPITAL_COMMUNITY)
Admission: RE | Admit: 2023-11-20 | Discharge: 2023-11-20 | Disposition: A | Discharge status: Home / Self Care | Source: Ambulatory Visit | Attending: Cardiology | Admitting: Cardiology

## 2023-11-20 DIAGNOSIS — I5022 Chronic systolic (congestive) heart failure: Secondary | ICD-10-CM | POA: Diagnosis not present

## 2023-11-20 LAB — BASIC METABOLIC PANEL WITH GFR
Anion gap: 9 (ref 5–15)
BUN: 5 mg/dL — ABNORMAL LOW (ref 6–20)
CO2: 21 mmol/L — ABNORMAL LOW (ref 22–32)
Calcium: 8.9 mg/dL (ref 8.9–10.3)
Chloride: 108 mmol/L (ref 98–111)
Creatinine, Ser: 0.62 mg/dL (ref 0.44–1.00)
GFR, Estimated: >60 mL/min (ref >60)
Glucose, Bld: 114 mg/dL — ABNORMAL HIGH (ref 70–99)
Potassium: 3.3 mmol/L — ABNORMAL LOW (ref 3.5–5.1)
Sodium: 138 mmol/L (ref 135–145)

## 2023-11-20 MED ORDER — SPIRONOLACTONE 25 MG PO TABS: 25.0000 mg | ORAL_TABLET | Freq: Every day | ORAL | 3 refills | Status: DC

## 2023-11-21 ENCOUNTER — Telehealth (HOSPITAL_COMMUNITY): Payer: Self-pay | Admitting: Cardiology

## 2023-11-21 ENCOUNTER — Telehealth: Payer: Self-pay | Admitting: Internal Medicine

## 2023-11-21 ENCOUNTER — Ambulatory Visit: Admitting: Family Medicine

## 2023-11-21 NOTE — Telephone Encounter (Signed)
 Called and advised per Vickie it is ok to hold the gabapentin  as dentist recommended. Pt verbalized understanding

## 2023-11-21 NOTE — Telephone Encounter (Signed)
 Patient called to notify office  Dental provider will prescribe abx (amoxicillin ) and tylenol  #3 for post extraction swelling and ?infection.  Wanted to notify heart doctor as fyi  Advised to contact PCP regarding gabapentin  hold while taking tylenol3, as this medication is managed by PCP

## 2023-11-21 NOTE — Telephone Encounter (Signed)
 Copied from CRM #[id number]. Topic: Clinical - Medical Advice >> Nov 21, 2023  1:52 PM Chasity T wrote: Reason for CRM: Patient is wanting to know if it is okay to stop the gabapentin  (NEURONTIN ) 300 MG capsule for 5 days to take tylenol  3 for current dental work per the dentist request. Please contact her back to advise her its okay.

## 2023-11-22 DIAGNOSIS — I5022 Chronic systolic (congestive) heart failure: Secondary | ICD-10-CM | POA: Diagnosis not present

## 2023-11-24 ENCOUNTER — Other Ambulatory Visit: Payer: Self-pay | Admitting: Cardiology

## 2023-11-28 ENCOUNTER — Other Ambulatory Visit (HOSPITAL_COMMUNITY)

## 2023-11-29 ENCOUNTER — Ambulatory Visit (HOSPITAL_COMMUNITY): Payer: Self-pay | Admitting: Family Medicine

## 2023-11-29 ENCOUNTER — Ambulatory Visit: Payer: 59

## 2023-11-29 ENCOUNTER — Ambulatory Visit (HOSPITAL_COMMUNITY)
Admission: RE | Admit: 2023-11-29 | Discharge: 2023-11-29 | Disposition: A | Discharge status: Home / Self Care | Source: Ambulatory Visit | Attending: Cardiology | Admitting: Cardiology

## 2023-11-29 ENCOUNTER — Ambulatory Visit (HOSPITAL_BASED_OUTPATIENT_CLINIC_OR_DEPARTMENT_OTHER)
Admission: RE | Admit: 2023-11-29 | Discharge: 2023-11-29 | Disposition: A | Discharge status: Home / Self Care | Source: Ambulatory Visit | Attending: Adult Health | Admitting: Adult Health

## 2023-11-29 VITALS — BP 102/68 | HR 76 | Wt 256.0 lb

## 2023-11-29 DIAGNOSIS — Z95818 Presence of other cardiac implants and grafts: Secondary | ICD-10-CM | POA: Diagnosis not present

## 2023-11-29 DIAGNOSIS — Z4509 Encounter for adjustment and management of other cardiac device: Secondary | ICD-10-CM | POA: Diagnosis not present

## 2023-11-29 DIAGNOSIS — I5022 Chronic systolic (congestive) heart failure: Secondary | ICD-10-CM | POA: Insufficient documentation

## 2023-11-29 LAB — BASIC METABOLIC PANEL WITH GFR
Anion gap: 7 (ref 5–15)
BUN: 6 mg/dL (ref 6–20)
CO2: 24 mmol/L (ref 22–32)
Calcium: 9.2 mg/dL (ref 8.9–10.3)
Chloride: 110 mmol/L (ref 98–111)
Creatinine, Ser: 0.7 mg/dL (ref 0.44–1.00)
GFR, Estimated: >60 mL/min (ref >60)
Glucose, Bld: 97 mg/dL (ref 70–99)
Potassium: 3.9 mmol/L (ref 3.5–5.1)
Sodium: 141 mmol/L (ref 135–145)

## 2023-11-29 NOTE — Patient Instructions (Signed)
 Great to see you today!!!  Your Barostim device is functioning properly  We will call you in 6 months (February) to schedule another battery check

## 2023-11-29 NOTE — Progress Notes (Addendum)
   Reason for Visit: Barostim Follow up  ID: Tara Bradshaw, DOB Aug 19, 1985, MRN [id number]  Primary HF MD: None Primary EP MD: OLE ONEIDA HOLTS, MD     Tara Bradshaw is a 38 y.o. female who previously underwent Barostim Implantation and presents today for in clinic battery/device check.  Vitals:   11/29/23 1116  BP: 102/68  Pulse: 76  SpO2: 99%    Device History:  Barostim device implanted 12/2021  Programming:   Amplitude: 3 v Pulse Width: 65 s  Impedance: Stable  Battery Life: 134.1 months Estimated date of battery replacement: 02/01/2035  Continue every 6 month follow up for battery checks.   Greig Mosses, NP

## 2023-12-02 ENCOUNTER — Ambulatory Visit: Attending: Cardiology

## 2023-12-02 DIAGNOSIS — I5022 Chronic systolic (congestive) heart failure: Secondary | ICD-10-CM

## 2023-12-02 DIAGNOSIS — Z95810 Presence of automatic (implantable) cardiac defibrillator: Secondary | ICD-10-CM

## 2023-12-04 ENCOUNTER — Ambulatory Visit (INDEPENDENT_AMBULATORY_CARE_PROVIDER_SITE_OTHER): Admitting: Family Medicine

## 2023-12-04 ENCOUNTER — Telehealth: Payer: Self-pay

## 2023-12-04 ENCOUNTER — Encounter: Payer: Self-pay | Admitting: Family Medicine

## 2023-12-04 DIAGNOSIS — G4733 Obstructive sleep apnea (adult) (pediatric): Secondary | ICD-10-CM | POA: Diagnosis not present

## 2023-12-04 DIAGNOSIS — Z95810 Presence of automatic (implantable) cardiac defibrillator: Secondary | ICD-10-CM

## 2023-12-04 DIAGNOSIS — I5022 Chronic systolic (congestive) heart failure: Secondary | ICD-10-CM

## 2023-12-04 DIAGNOSIS — E0590 Thyrotoxicosis, unspecified without thyrotoxic crisis or storm: Secondary | ICD-10-CM | POA: Diagnosis not present

## 2023-12-04 DIAGNOSIS — R7303 Prediabetes: Secondary | ICD-10-CM

## 2023-12-04 NOTE — Telephone Encounter (Signed)
Remote ICM transmission received.  Attempted call to patient regarding ICM remote transmission and no answer.  Mail box is full. 

## 2023-12-04 NOTE — Patient Instructions (Signed)
 Our records indicate that you are overdue for Tdap and pneumonia vaccines. You can check with your pharmacy about these.

## 2023-12-04 NOTE — Progress Notes (Signed)
 EPIC Encounter for ICM Monitoring  Patient Name: Tara Bradshaw is a 38 y.o. female Date: 12/04/2023 Primary Care Physican: Lendia Boby CROME, NP-C Primary Cardiologist: Bensimhon Electrophysiologist: Cindie 03/01/2023 Office Weight: 261 lbs 07/10/2023 Weight: 261 lbs 09/17/2023 Weight: 268 lbs 10/22/2023 Office Weight: 254 lbs   NSVT 6                Attempted call to patient and unable to reach.   Transmission results reviewed.    Corvue thoracic impedance suggesting intermittent days with possible fluid accumulation within the last month.       Prescribed: Furosemide  40 mg take 2 tablet(s) (80 mg total) by mouth twice a day.   Potassium 20 mEq take 3 tablet(s) (60 mg total) by mouth daily.   Labs: 10/22/2023 Creatinine 0.67, BUN 6, Potassium 4.1, Sodium 139, GFR >60 08/21/2023 Creatinine 0.70, BUN 9, Potassium 4.0, Sodium 138, GFR 108.79 06/14/2023 Creatinine 0.44, BUN 9, Potassium 3.7, Sodium 139, GFR <60, BNP 791.5 11/15/2022 Creatinine 0.79, BUN 9, Potassium 4.4, Sodium 136, GFR >60  A complete set of results can be found in Results Review.   Recommendations:  Unable to reach.     Follow-up plan: ICM clinic phone appointment on 01/06/2024.   91 day device clinic remote transmission 02/03/2024.       EP/Cardiology Office Visits:  Recall 01/20/2024 with Dr Cherrie.    Recall 02/16/2024 with Jodie Passey, PA.   Copy of ICM check sent to Dr. Cindie.   3 month ICM trend: 12/02/2023.    12-14 Month ICM trend:     Mitzie GORMAN Garner, RN 12/04/2023 5:03 PM

## 2023-12-04 NOTE — Progress Notes (Signed)
 Subjective:     Patient ID: Tara Bradshaw, female    DOB: 12/11/1985, 38 y.o.   MRN: [id number]  Chief Complaint  Patient presents with   Medical Management of Chronic Issues    3 month f/u    HPI  Discussed the use of AI scribe software for clinical note transcription with the patient, who gave verbal consent to proceed.  History of Present Illness Tara Bradshaw is a 38 year old female with congestive heart failure and morbid obesity who presents for follow-up on chronic health conditions.  Congestive heart failure and fluid retention - Congestive heart failure managed by cardiology with an implanted cardioverter-defibrillator (ICD) in place - Fluid retention present, contributing to decreased appetite and sensation of sluggishness - Spironolactone  dose recently increased to a full pill - Hospitalization for pneumonia two years ago - No chest pain, shortness of breath, or palpitations reported  Icd-related pain - ICD-related pain occurs primarily at night and after physical activity - Gabapentin  used for pain management  Obesity and weight management - Morbid obesity with prior referral to a weight management program, unable to participate due to insurance fee - Cardiology team working to resume Ozempic  for weight management, but insurance coverage was dropped after hospitalization for pneumonia  Sleep apnea - Obstructive sleep apnea managed with CPAP therapy - Prefers full face mask for CPAP use  Functional status and social stressors - On disability due to cardiomyopathy - Recently returned home after displacement caused by lack of heat and air conditioning, requiring frequent moves between hotel rooms  Other providers: Cardiologist - Dr. Cherrie, Dr. Lesia OB/GYN- Dr. Jeralyn  [facility]- Dr. Serene (released her)      CHF- started after her last pregnancy 5 years ago       Health Maintenance Due  Topic Date Due   Medicare Annual Wellness  (AWV)  Never done   DTaP/Tdap/Td (1 - Tdap) Never done   Pneumococcal Vaccine (1 of 2 - PCV) Never done   Hepatitis B Vaccines 19-59 Average Risk (1 of 3 - 19+ 3-dose series) Never done   HPV VACCINES (1 - 3-dose SCDM series) Never done   INFLUENZA VACCINE  11/08/2023    Past Medical History:  Diagnosis Date   AICD (automatic cardioverter/defibrillator) present    Allergy    Anemia    CHRONIC   CHF (congestive heart failure) (HCC)    Chronic systolic dysfunction of left ventricle    GERD (gastroesophageal reflux disease)    WITH PREGNANCY   Nonischemic cardiomyopathy (HCC)    Pneumonia    x 1   PVC's (premature ventricular contractions)    Sleep apnea     Past Surgical History:  Procedure Laterality Date   CARDIAC CATHETERIZATION     DILATION AND EVACUATION N/A 11/30/2016   Procedure: DILATATION AND EVACUATION;  Surgeon: Fredirick Glenys RAMAN, MD;  Location: [facility];  Service: Gynecology;  Laterality: N/A;   ICD IMPLANT N/A 02/19/2019   Procedure: ICD IMPLANT;  Surgeon: Kelsie Agent, MD;  Location: [facility];  Service: Cardiovascular;  Laterality: N/A;   LEAD REVISION/REPAIR N/A 04/16/2019   Procedure: LEAD REVISION/REPAIR;  Surgeon: Kelsie Agent, MD;  Location: [facility];  Service: Cardiovascular;  Laterality: N/A;   RIGHT HEART CATH N/A 04/24/2022   Procedure: RIGHT HEART CATH;  Surgeon: Cherrie Toribio SAUNDERS, MD;  Location: [facility];  Service: Cardiovascular;  Laterality: N/A;   RIGHT/LEFT HEART CATH AND CORONARY ANGIOGRAPHY N/A 01/22/2019  Procedure: RIGHT/LEFT HEART CATH AND CORONARY ANGIOGRAPHY;  Surgeon: Cherrie Toribio SAUNDERS, MD;  Location: [facility];  Service: Cardiovascular;  Laterality: N/A;   WISDOM TOOTH EXTRACTION     NO ANESTHESIA    Family History  Problem Relation Age of Onset   Epilepsy Mother    Asthma Mother    Vision loss Mother    Heart disease Father    Cancer Paternal Grandmother        BREAST   Cancer Maternal Aunt  64       BREAST   Hyperlipidemia Maternal Aunt    Hypertension Maternal Grandmother    Hearing loss Maternal Grandmother    Kidney disease Maternal Grandmother    Cancer Maternal Grandfather    Hearing loss Maternal Grandfather    Heart disease Paternal Grandfather    Hearing loss Maternal Uncle    Hearing loss Maternal Aunt     Social History   Socioeconomic History   Marital status: Widowed    Spouse name: Not on file   Number of children: Not on file   Years of education:  14   Highest education level: Associate degree: occupational, Scientist, product/process development, or vocational program  Occupational History   Occupation: SECURITY OFFICER    Employer: [employer]  Tobacco Use   Smoking status: Never    Passive exposure: Never   Smokeless tobacco: Never  Vaping Use   Vaping status: Never Used  Substance and Sexual Activity   Alcohol use: No   Drug use: No   Sexual activity: Yes    Partners: Male    Birth control/protection: I.U.D.  Other Topics Concern   Not on file  Social History Narrative   Lives in [place] with husband and children (6,1)   Unemployed    Previously worked in Office manager.   Social Drivers of Corporate investment banker Strain: Low Risk  (10/24/2023)   Overall Financial Resource Strain (CARDIA)    Difficulty of Paying Living Expenses: Not hard at all  Food Insecurity: No Food Insecurity (10/24/2023)   Hunger Vital Sign    Worried About Running Out of Food in the Last Year: Never true    Ran Out of Food in the Last Year: Never true  Transportation Needs: No Transportation Needs (10/24/2023)   PRAPARE - Administrator, Civil Service (Medical): No    Lack of Transportation (Non-Medical): No  Physical Activity: Inactive (10/24/2023)   Exercise Vital Sign    Days of Exercise per Week: 0 days    Minutes of Exercise per Session: Not on file  Stress: No Stress Concern Present (10/24/2023)   [employer] of Occupational Health -  Occupational Stress Questionnaire    Feeling of Stress: Not at all  Social Connections: Moderately Integrated (10/24/2023)   Social Connection and Isolation Panel    Frequency of Communication with Friends and Family: More than three times a week    Frequency of Social Gatherings with Friends and Family: More than three times a week    Attends Religious Services: 1 to 4 times per year    Active Member of [employer] or Organizations: Yes    Attends Banker Meetings: 1 to 4 times per year    Marital Status: Widowed  Intimate Partner Violence: Not At Risk (12/05/2017)   Humiliation, Afraid, Rape, and Kick questionnaire    Fear of Current or Ex-Partner: No    Emotionally Abused: No    Physically Abused: No  Sexually Abused: No    Outpatient Medications Prior to Visit  Medication Sig Dispense Refill   acetaminophen  (TYLENOL ) 325 MG tablet Take 650 mg by mouth every 6 (six) hours as needed for mild pain or headache.     FARXIGA  10 MG TABS tablet TAKE 1 TABLET BY MOUTH DAILY  BEFORE BREAKFAST 100 tablet 2   furosemide  (LASIX ) 40 MG tablet Take 2 tablets (80 mg total) by mouth 2 (two) times daily. 120 tablet 3   gabapentin  (NEURONTIN ) 300 MG capsule Take 1 capsule (300 mg total) by mouth at bedtime. 90 capsule 0   levonorgestrel  (MIRENA ) 20 MCG/24HR IUD 1 each by Intrauterine route once.     meclizine  (ANTIVERT ) 25 MG tablet Take 1 tablet (25 mg total) by mouth 3 (three) times daily as needed for dizziness. 30 tablet 0   Multiple Vitamins-Minerals (WOMENS MULTIVITAMIN PO) Take 1 tablet by mouth daily.     NON FORMULARY Pt uses a cpap nightly     potassium chloride  SA (KLOR-CON  M) 20 MEQ tablet TAKE 2 TABLETS BY MOUTH DAILY 200 tablet 2   sacubitril -valsartan  (ENTRESTO ) 97-103 MG Take 1 tablet by mouth 2 (two) times daily. 180 tablet 3   spironolactone  (ALDACTONE ) 25 MG tablet Take 1 tablet (25 mg total) by mouth daily. 90 tablet 3   No facility-administered medications prior to visit.     No Known Allergies  Review of Systems  Constitutional:  Positive for malaise/fatigue and weight loss. Negative for chills and fever.  Respiratory:  Negative for cough and shortness of breath.   Cardiovascular:  Negative for chest pain, palpitations and leg swelling.  Gastrointestinal:  Negative for abdominal pain, constipation, diarrhea, nausea and vomiting.  Genitourinary:  Negative for dysuria, frequency and urgency.  Neurological:  Negative for dizziness and focal weakness.       Objective:    Physical Exam Constitutional:      General: She is not in acute distress.    Appearance: She is not ill-appearing.  HENT:     Mouth/Throat:     Mouth: Mucous membranes are moist.     Pharynx: Oropharynx is clear.  Eyes:     Extraocular Movements: Extraocular movements intact.     Conjunctiva/sclera: Conjunctivae normal.     Pupils: Pupils are equal, round, and reactive to light.  Cardiovascular:     Rate and Rhythm: Normal rate and regular rhythm.  Pulmonary:     Effort: Pulmonary effort is normal.     Breath sounds: Normal breath sounds.  Musculoskeletal:     Cervical back: Normal range of motion and neck supple.     Right lower leg: No edema.     Left lower leg: No edema.  Skin:    General: Skin is warm and dry.  Neurological:     General: No focal deficit present.     Mental Status: She is alert and oriented to person, place, and time.     Motor: No weakness.     Coordination: Coordination normal.     Gait: Gait normal.  Psychiatric:        Mood and Affect: Mood normal.        Behavior: Behavior normal.        Thought Content: Thought content normal.      BP 126/80   Pulse 80   Temp 97.7 F (36.5 C) (Temporal)   Ht 5' 2 (1.575 m)   Wt 251 lb (113.9 kg)   SpO2 98%   BMI  45.91 kg/m  Wt Readings from Last 3 Encounters:  12/04/23 251 lb (113.9 kg)  11/29/23 256 lb (116.1 kg)  10/24/23 256 lb (116.1 kg)        Assessment & Plan:   Problem List Items  Addressed This Visit     ICD (implantable cardioverter-defibrillator) in place   Morbid obesity (HCC) - Primary   Subclinical hyperthyroidism   Other Visit Diagnoses       Prediabetes         Chronic systolic heart failure (HCC)         OSA on CPAP           Assessment and Plan Assessment & Plan Chronic systolic heart failure with ICD and pain related to device Chronic systolic heart failure with ICD in place. Pain related to the ICD device persists, requiring gabapentin  for management. Recent increase in spironolactone  dosage to a full pill due to stable lab results. Closely followed by cardiology with recent evaluation last week. - Continue gabapentin  for pain management related to ICD device. - Follow up with cardiology as scheduled.  Morbid obesity Morbid obesity with recent weight loss of nine pounds. Referred to weight management program, but unable to afford the insurance fee. Cardiologist is attempting to restart Ozempic  for weight management, pending insurance approval. - Follow up with cardiology regarding potential restart of Ozempic  for weight management.  Prediabetes Prediabetes identified on recent screening. Not currently eligible for GLP-1 therapy through this provider.  Obstructive sleep apnea Obstructive sleep apnea managed with CPAP therapy. She uses a full mask CPAP device nightly with good compliance. - Continue CPAP therapy nightly.  Plantar fasciitis of right foot Improvement in plantar fasciitis symptoms since last visit.      I am having Loyola Balke maintain her levonorgestrel , acetaminophen , NON FORMULARY, Multiple Vitamins-Minerals (WOMENS MULTIVITAMIN PO), Entresto , meclizine , furosemide , gabapentin , spironolactone , potassium chloride  SA, and Farxiga .  No orders of the defined types were placed in this encounter.

## 2023-12-10 ENCOUNTER — Ambulatory Visit: Attending: Cardiology | Admitting: Pharmacist

## 2023-12-10 ENCOUNTER — Telehealth: Payer: Self-pay

## 2023-12-10 ENCOUNTER — Other Ambulatory Visit (HOSPITAL_COMMUNITY): Payer: Self-pay

## 2023-12-10 NOTE — Telephone Encounter (Signed)
-----   Message from Eleanor JONETTA Crews sent at 12/10/2023 12:06 PM EDT ----- Please do PA for Zepbound

## 2023-12-10 NOTE — Telephone Encounter (Signed)
 Pharmacy Patient Advocate Encounter   Received notification from Physician's Office that prior authorization for ZEPBOUND  is required/requested.   Insurance verification completed.   The patient is insured through [facility] .   Per test claim: PA required; PA submitted to above mentioned insurance via Latent Key/confirmation #/EOC [id number] Status is pending

## 2023-12-10 NOTE — Progress Notes (Signed)
 Patient ID: Phillippa Straub                 DOB: 1985-11-01                    MRN: [id number]     HPI: Neriyah Cercone is a 38 y.o. female patient of Dr. Cherrie referred to pharmacy clinic by Harlene Ganong to initiate GLP1-RA therapy. PMH is significant for HFrEF, OSA and obesity. Most recent BMI 46 kg/m .  I saw patient previously in clinic and she was started on Ozempic  but had to stop due to off label use.  She has a diagnosis of sleep apnea and an AHI of 15.3 which should qualify her for Zepbound  therapy.  Patient presents today to Pharm.D. clinic.  She did struggle with some nausea with Ozempic .  She also did have diarrhea when she ate fast food.  She does admit that she eats out too often and sometimes chooses the easy option.  Has done a lot better with soda but still drinking some drinks with sugar.  Exercise is a challenge given her struggles with her cardiomyopathy and fluid.  She gets very short of breath, but tries to walk up and down her driveway and get housework done.  We talked about some resistance training exercises she could do.  Diet:  Breakfast: doesn't eat Lunch: left overs, sometimes nothing, sandwich Dinner: green beans, collards, crockpot chicken, rice- or take out cheeseburger and drink, [employer], habachi, eats a lot of rice Drink: lemon water, ginger- has done a lot better with sodas, V8 juice, packets for water (some of them are sugar free)  Exercise: tries to walk up and down her driveway, houswork  Family History:  Family History  Problem Relation Age of Onset   Epilepsy Mother    Asthma Mother    Vision loss Mother    Heart disease Father    Cancer Paternal Grandmother        BREAST   Cancer Maternal Aunt 52       BREAST   Hyperlipidemia Maternal Aunt    Hypertension Maternal Grandmother    Hearing loss Maternal Grandmother    Kidney disease Maternal Grandmother    Cancer Maternal Grandfather    Hearing loss Maternal Grandfather    Heart disease  Paternal Grandfather    Hearing loss Maternal Uncle    Hearing loss Maternal Aunt      Social History: no tobacco, no ETOH  Labs: Lab Results  Component Value Date   HGBA1C 6.0 08/21/2023    Wt Readings from Last 1 Encounters:  12/04/23 251 lb (113.9 kg)    BP Readings from Last 1 Encounters:  12/04/23 126/80   Pulse Readings from Last 1 Encounters:  12/04/23 80       Component Value Date/Time   CHOL 194 08/21/2023 1010   TRIG 60.0 08/21/2023 1010   HDL 39.70 08/21/2023 1010   CHOLHDL 5 08/21/2023 1010   VLDL 12.0 08/21/2023 1010   LDLCALC 142 (H) 08/21/2023 1010    Past Medical History:  Diagnosis Date   AICD (automatic cardioverter/defibrillator) present    Allergy    Anemia    CHRONIC   CHF (congestive heart failure) (HCC)    Chronic systolic dysfunction of left ventricle    GERD (gastroesophageal reflux disease)    WITH PREGNANCY   Nonischemic cardiomyopathy (HCC)    Pneumonia    x 1   PVC's (premature ventricular contractions)    Sleep  apnea     Current Outpatient Medications on File Prior to Visit  Medication Sig Dispense Refill   acetaminophen  (TYLENOL ) 325 MG tablet Take 650 mg by mouth every 6 (six) hours as needed for mild pain or headache.     FARXIGA  10 MG TABS tablet TAKE 1 TABLET BY MOUTH DAILY  BEFORE BREAKFAST 100 tablet 2   furosemide  (LASIX ) 40 MG tablet Take 2 tablets (80 mg total) by mouth 2 (two) times daily. 120 tablet 3   gabapentin  (NEURONTIN ) 300 MG capsule Take 1 capsule (300 mg total) by mouth at bedtime. 90 capsule 0   levonorgestrel  (MIRENA ) 20 MCG/24HR IUD 1 each by Intrauterine route once.     meclizine  (ANTIVERT ) 25 MG tablet Take 1 tablet (25 mg total) by mouth 3 (three) times daily as needed for dizziness. 30 tablet 0   Multiple Vitamins-Minerals (WOMENS MULTIVITAMIN PO) Take 1 tablet by mouth daily.     NON FORMULARY Pt uses a cpap nightly     potassium chloride  SA (KLOR-CON  M) 20 MEQ tablet TAKE 2 TABLETS BY MOUTH DAILY  200 tablet 2   sacubitril -valsartan  (ENTRESTO ) 97-103 MG Take 1 tablet by mouth 2 (two) times daily. 180 tablet 3   spironolactone  (ALDACTONE ) 25 MG tablet Take 1 tablet (25 mg total) by mouth daily. 90 tablet 3   No current facility-administered medications on file prior to visit.    No Known Allergies   Assessment/Plan:  1. Weight loss - Patient has not met goal of at least 5% of body weight loss with comprehensive lifestyle modifications alone in the past 3-6 months. Pharmacotherapy is appropriate to pursue as augmentation. Will start Zepbound  2.5 mg weekly. Confirmed patient not pregnant and no personal or family history of medullary thyroid  carcinoma (MTC) or Multiple Endocrine Neoplasia syndrome type 2 (MEN 2).  No history of pancreatitis or gallstones.  Injection technique reviewed at today's visit.  Advised patient on common side effects including nausea, diarrhea, dyspepsia, decreased appetite, and fatigue. Counseled patient on reducing meal size and how to titrate medication to minimize side effects. Counseled patient to call if intolerable side effects or if experiencing dehydration, abdominal pain, or dizziness. Along with pharmacotherapy, the patient will follow dietary modifications and aim for at least 150 minutes of moderate-intensity exercise per week, plus resistance training twice a week (as recommended by the American Heart Association). This resistance training--such as weightlifting, bodyweight exercises, or using resistance bands, adapted to the patient's ability--will help prevent muscle loss.  Follow up in 1-2 days regarding coverage of Zepbound . If therapy is initiated, phone follow-ups will be conducted every 4 weeks for dose titration until the patient reaches the effective therapeutic dose and target weight.  Reeshemah Nazaryan D Haylo Fake, Pharm.JONETTA SARAN, CPP [facility] [address] [place], [place] [address]  Phone: [phone]; Fax: [phone]

## 2023-12-10 NOTE — Telephone Encounter (Signed)
 Pharmacy Patient Advocate Encounter  Received notification from [employer] that Prior Authorization for ZEPBOUND  has been APPROVED from 12/10/23 to 04/08/24. Ran test claim, Copay is $0. This test claim was processed through [facility]- copay amounts may vary at other pharmacies due to pharmacy/plan contracts, or as the patient moves through the different stages of their insurance plan.

## 2023-12-10 NOTE — Patient Instructions (Signed)

## 2023-12-11 MED ORDER — ZEPBOUND 2.5 MG/0.5ML ~~LOC~~ SOAJ: 2.5000 mg | SUBCUTANEOUS | 0 refills | Status: DC

## 2023-12-11 NOTE — Addendum Note (Signed)
 Addended by: Darleene Cumpian D on: 12/11/2023 11:41 AM   Modules accepted: Orders

## 2023-12-28 ENCOUNTER — Encounter: Payer: Self-pay | Admitting: Pharmacist

## 2023-12-30 MED ORDER — ONDANSETRON 4 MG PO TBDP: 4.0000 mg | ORAL_TABLET | Freq: Three times a day (TID) | ORAL | 0 refills | Status: DC | PRN

## 2024-01-06 ENCOUNTER — Encounter

## 2024-01-07 ENCOUNTER — Encounter (HOSPITAL_COMMUNITY): Payer: Self-pay | Admitting: *Deleted

## 2024-01-07 MED ORDER — ZEPBOUND 2.5 MG/0.5ML ~~LOC~~ SOAJ: 2.5000 mg | SUBCUTANEOUS | 0 refills | Status: DC

## 2024-01-07 NOTE — Addendum Note (Signed)
 Addended by: Diogo Anne D on: 01/07/2024 01:37 PM   Modules accepted: Orders

## 2024-01-08 NOTE — Progress Notes (Signed)
 No ICM remote transmission received for 01/06/2024 and next ICM transmission scheduled for 01/27/2024.

## 2024-01-10 ENCOUNTER — Ambulatory Visit

## 2024-01-14 ENCOUNTER — Encounter: Payer: Self-pay | Admitting: Family Medicine

## 2024-01-15 ENCOUNTER — Other Ambulatory Visit: Payer: Self-pay | Admitting: Family Medicine

## 2024-01-15 DIAGNOSIS — Z1231 Encounter for screening mammogram for malignant neoplasm of breast: Secondary | ICD-10-CM

## 2024-01-15 MED ORDER — GABAPENTIN 300 MG PO CAPS: 300.0000 mg | ORAL_CAPSULE | Freq: Every day | ORAL | 0 refills | Status: DC

## 2024-01-16 ENCOUNTER — Encounter

## 2024-01-16 DIAGNOSIS — Z1231 Encounter for screening mammogram for malignant neoplasm of breast: Secondary | ICD-10-CM

## 2024-01-20 ENCOUNTER — Other Ambulatory Visit: Payer: Self-pay | Admitting: Cardiology

## 2024-01-27 ENCOUNTER — Ambulatory Visit: Attending: Cardiology

## 2024-01-27 DIAGNOSIS — Z95810 Presence of automatic (implantable) cardiac defibrillator: Secondary | ICD-10-CM

## 2024-01-27 DIAGNOSIS — I5022 Chronic systolic (congestive) heart failure: Secondary | ICD-10-CM

## 2024-01-28 ENCOUNTER — Telehealth: Payer: Self-pay

## 2024-01-28 NOTE — Telephone Encounter (Signed)
 Remote ICM transmission received.  Attempted call to patient regarding ICM remote transmission and no answer.

## 2024-01-28 NOTE — Progress Notes (Signed)
 EPIC Encounter for ICM Monitoring  Patient Name: Tara Bradshaw is a 38 y.o. female Date: 01/28/2024 Primary Care Physican: Lendia Boby CROME, NP-C Primary Cardiologist: Bensimhon Electrophysiologist: Cindie 03/01/2023 Office Weight: 261 lbs 07/10/2023 Weight: 261 lbs 09/17/2023 Weight: 268 lbs 10/22/2023 Office Weight: 254 lbs   NSVT 8                Attempted call to patient and unable to reach.   Transmission results reviewed.    Corvue thoracic impedance suggesting possible fluid accumulation starting 12/16/2023-01/16/2024 and possible dryness starting 01/20/2024 but returned to baseline 01/27/2024.       Prescribed: Furosemide  40 mg take 2 tablet(s) (80 mg total) by mouth twice a day.   Potassium 20 mEq take 3 tablet(s) (60 mg total) by mouth daily.   Labs: 11/29/2023 Creatinine 0.70, BUN 6, Potassium 3.9, Sodium 141, GFR >60  11/20/2023 Creatinine 0.62, BUN 5, Potassium 3.3, Sodium 138, GFR >60  10/22/2023 Creatinine 0.67, BUN 6, Potassium 4.1, Sodium 139, GFR >60 08/21/2023 Creatinine 0.70, BUN 9, Potassium 4.0, Sodium 138, GFR 108.79 06/14/2023 Creatinine 0.44, BUN 9, Potassium 3.7, Sodium 139, GFR <60, BNP 791.5 11/15/2022 Creatinine 0.79, BUN 9, Potassium 4.4, Sodium 136, GFR >60  A complete set of results can be found in Results Review.   Recommendations:  Unable to reach.     Follow-up plan: ICM clinic phone appointment on 03/02/2024.   91 day device clinic remote transmission 02/03/2024.       EP/Cardiology Office Visits:  Recall 01/20/2024 with Dr Cherrie.    Recall 02/16/2024 with Jodie Passey, PA.   Copy of ICM check sent to Dr. Cindie.   Remote monitoring is medically necessary for Heart Failure Management.    Daily Thoracic Impedance ICM trend: 10/29/2023 through 01/27/2024.    12-14 Month Thoracic Impedance ICM trend:     Mitzie GORMAN Garner, RN 01/28/2024 2:50 PM

## 2024-02-03 ENCOUNTER — Ambulatory Visit

## 2024-02-03 DIAGNOSIS — I5022 Chronic systolic (congestive) heart failure: Secondary | ICD-10-CM

## 2024-02-06 ENCOUNTER — Ambulatory Visit: Payer: Self-pay | Admitting: Cardiology

## 2024-02-06 ENCOUNTER — Telehealth: Payer: Self-pay

## 2024-02-06 LAB — CUP PACEART REMOTE DEVICE CHECK
Battery Remaining Longevity: 60 mo
Battery Remaining Percentage: 55 %
Battery Voltage: 2.96 V
Brady Statistic RV Percent Paced: 1 %
Date Time Interrogation Session: 20251028101202
HighPow Impedance: 53 Ohm
Implantable Lead Connection Status: 753985
Implantable Lead Implant Date: 20210107
Implantable Lead Location: 753860
Implantable Pulse Generator Implant Date: 20201112
Lead Channel Impedance Value: 310 Ohm
Lead Channel Pacing Threshold Amplitude: 1.75 V
Lead Channel Pacing Threshold Pulse Width: 0.5 ms
Lead Channel Sensing Intrinsic Amplitude: 10.2 mV
Lead Channel Setting Pacing Amplitude: 3.5 V
Lead Channel Setting Pacing Pulse Width: 0.5 ms
Lead Channel Setting Sensing Sensitivity: 0.5 mV
Pulse Gen Serial Number: 111012702
Zone Setting Status: 755011

## 2024-02-06 NOTE — Telephone Encounter (Signed)
 Called back [facility]low and informed them this was never discussed w pt and so she will need an appt. They state they will let pt know to reach out to us  for an appt

## 2024-02-06 NOTE — Progress Notes (Signed)
 Remote ICD Transmission

## 2024-02-06 NOTE — Telephone Encounter (Signed)
 Copied from CRM [id number]. Topic: General - Other >> Feb 06, 2024  8:27 AM Berneda FALCON wrote: Reason for CRM: [place] from [facility] states they got documentation back but more is needed. She needs either an addendum to most recent OV notes-that mention the patient is incontinent or needs incontinent supplies. The notes from May until recent do not show incontinence which is what the insurance requires in order to approve the supplies for her.  We can either do amended notes, or some type of recent notes that include the incontinence for the patient.  [phone]  Patient [id number]  If notes cannot be addended, please call so they can inform the patient they need an additional appt where incontinence is discussed.

## 2024-02-11 ENCOUNTER — Encounter: Payer: Self-pay | Admitting: Pharmacist

## 2024-02-11 ENCOUNTER — Other Ambulatory Visit (HOSPITAL_COMMUNITY): Payer: Self-pay

## 2024-02-11 MED ORDER — ZEPBOUND 5 MG/0.5ML ~~LOC~~ SOAJ: 5.0000 mg | SUBCUTANEOUS | 0 refills | Status: DC

## 2024-02-11 MED ORDER — ONDANSETRON 4 MG PO TBDP: 4.0000 mg | ORAL_TABLET | Freq: Three times a day (TID) | ORAL | 0 refills | Status: AC | PRN

## 2024-02-11 MED ORDER — MOUNJARO 2.5 MG/0.5ML ~~LOC~~ SOAJ: 2.5000 mg | SUBCUTANEOUS | 3 refills | Status: DC

## 2024-02-11 NOTE — Addendum Note (Signed)
 Addended by: Ambree Frances D on: 02/11/2024 12:04 PM   Modules accepted: Orders

## 2024-02-20 ENCOUNTER — Ambulatory Visit

## 2024-02-28 ENCOUNTER — Other Ambulatory Visit (HOSPITAL_COMMUNITY): Payer: Self-pay

## 2024-02-28 ENCOUNTER — Telehealth: Payer: Self-pay | Admitting: Family Medicine

## 2024-02-28 ENCOUNTER — Ambulatory Visit
Admission: EM | Admit: 2024-02-28 | Discharge: 2024-02-28 | Disposition: A | Discharge status: Home / Self Care | Attending: Family Medicine | Admitting: Family Medicine

## 2024-02-28 ENCOUNTER — Ambulatory Visit: Payer: 59

## 2024-02-28 DIAGNOSIS — J069 Acute upper respiratory infection, unspecified: Secondary | ICD-10-CM | POA: Diagnosis not present

## 2024-02-28 LAB — POC COVID19/FLU A&B COMBO
Covid Antigen, POC: NEGATIVE
Influenza A Antigen, POC: NEGATIVE
Influenza B Antigen, POC: NEGATIVE

## 2024-02-28 MED ORDER — BENZONATATE 100 MG PO CAPS: 100.0000 mg | ORAL_CAPSULE | Freq: Three times a day (TID) | ORAL | 0 refills | Status: DC | PRN

## 2024-02-28 MED ORDER — BENZONATATE 100 MG PO CAPS
100.0000 mg | ORAL_CAPSULE | Freq: Three times a day (TID) | ORAL | 0 refills | Status: DC | PRN
  Filled 2024-02-28: qty 21, 7d supply, fill #0

## 2024-02-28 MED ORDER — PROMETHAZINE-DM 6.25-15 MG/5ML PO SYRP: 5.0000 mL | ORAL_SOLUTION | Freq: Four times a day (QID) | ORAL | 0 refills | Status: DC | PRN

## 2024-02-28 MED ORDER — ACETAMINOPHEN 325 MG PO TABS
650.0000 mg | ORAL_TABLET | Freq: Once | ORAL | Status: AC
  Administered 2024-02-28: 650 mg via ORAL

## 2024-02-28 NOTE — ED Triage Notes (Signed)
 Patient reports feeling bad the last few days, stating with a Cough then a warm sensation up/down back. Patients states she has CHF so stays concerned when their is any illness. Concerned with back pain turning into various aches all over, some weakness, throat is becoming sore with Cough with right ear pain. No fever. She does have PCP, next appt available Monday.

## 2024-02-28 NOTE — ED Provider Notes (Signed)
 [facility]    CSN: [id number] Arrival date & time: 02/28/24  1513      History   Chief Complaint Chief Complaint  Patient presents with   Generalized Body Aches   Cough   Weakness    HPI Tara Bradshaw is a 38 y.o. female.    Cough Weakness Associated symptoms: cough   Here for cough and myalgia and rhinorrhea.  Symptoms have been going on for about 2 days, since 11/19.  She was not aware that she had fever until she got here today and had a temperature of 101.  No nausea or vomiting or diarrhea.  Past medical history significant for congestive heart failure.  She does not take any blood thinners.  She has not had any increase in shortness of breath since this began.  Last eGFR was in the 40s.  Past Medical History:  Diagnosis Date   AICD (automatic cardioverter/defibrillator) present    Allergy    Anemia    CHRONIC   CHF (congestive heart failure) (HCC)    Chronic systolic dysfunction of left ventricle    GERD (gastroesophageal reflux disease)    WITH PREGNANCY   Nonischemic cardiomyopathy (HCC)    Pneumonia    x 1   PVC's (premature ventricular contractions)    Sleep apnea     Patient Active Problem List   Diagnosis Date Noted   Multifocal pneumonia 11/10/2022   Adjustment disorder with mixed anxiety and depressed mood 10/24/2019   Cardiomyopathy (HCC) 04/06/2019   ICD (implantable cardioverter-defibrillator) in place 04/06/2019   Subclinical hyperthyroidism 07/05/2018   Abnormal transaminases 07/05/2018   Acute congestive heart failure (HCC) 07/03/2018   Anemia, postpartum 12/13/2017   Normal postpartum course 12/13/2017   Indication for care in labor or delivery 12/11/2017   Hyperemesis affecting pregnancy, antepartum 07/25/2017   Dehydration during pregnancy 07/23/2017   Morbid obesity (HCC) 11/26/2016   GERD (gastroesophageal reflux disease) 11/26/2016    Past Surgical History:  Procedure Laterality Date   CARDIAC  CATHETERIZATION     DILATION AND EVACUATION N/A 11/30/2016   Procedure: DILATATION AND EVACUATION;  Surgeon: Fredirick Glenys RAMAN, MD;  Location: [facility];  Service: Gynecology;  Laterality: N/A;   ICD IMPLANT N/A 02/19/2019   Procedure: ICD IMPLANT;  Surgeon: Kelsie Agent, MD;  Location: [facility];  Service: Cardiovascular;  Laterality: N/A;   LEAD REVISION/REPAIR N/A 04/16/2019   Procedure: LEAD REVISION/REPAIR;  Surgeon: Kelsie Agent, MD;  Location: [facility];  Service: Cardiovascular;  Laterality: N/A;   RIGHT HEART CATH N/A 04/24/2022   Procedure: RIGHT HEART CATH;  Surgeon: Cherrie Toribio SAUNDERS, MD;  Location: [facility];  Service: Cardiovascular;  Laterality: N/A;   RIGHT/LEFT HEART CATH AND CORONARY ANGIOGRAPHY N/A 01/22/2019   Procedure: RIGHT/LEFT HEART CATH AND CORONARY ANGIOGRAPHY;  Surgeon: Cherrie Toribio SAUNDERS, MD;  Location: [facility];  Service: Cardiovascular;  Laterality: N/A;   WISDOM TOOTH EXTRACTION     NO ANESTHESIA    OB History     Gravida  3   Para  2   Term  2   Preterm      AB  1   Living  2      SAB  1   IAB      Ectopic      Multiple  0   Live Births  2        Obstetric Comments  G2- D&C for missed AB  Home Medications    Prior to Admission medications   Medication Sig Start Date End Date Taking? Authorizing Provider  FARXIGA  10 MG TABS tablet TAKE 1 TABLET BY MOUTH DAILY  BEFORE BREAKFAST 11/27/23  Yes Cindie Ole DASEN, MD  levonorgestrel  (MIRENA ) 20 MCG/24HR IUD 1 each by Intrauterine route once.   Yes [provider]  meclizine  (ANTIVERT ) 25 MG tablet Take 1 tablet (25 mg total) by mouth 3 (three) times daily as needed for dizziness. 03/22/23  Yes Bensimhon, Toribio SAUNDERS, MD  Multiple Vitamins-Minerals (WOMENS MULTIVITAMIN PO) Take 1 tablet by mouth daily.   Yes [provider]  potassium chloride  SA (KLOR-CON  M) 20 MEQ tablet TAKE 2 TABLETS BY MOUTH DAILY 11/27/23  Yes Cindie Ole DASEN, MD  sacubitril -valsartan  (ENTRESTO ) 97-103 MG TAKE 1 TABLET BY MOUTH TWICE  DAILY 01/22/24  Yes Cindie Ole DASEN, MD  spironolactone  (ALDACTONE ) 25 MG tablet Take 1 tablet (25 mg total) by mouth daily. 11/20/23  Yes Milford, Harlene HERO, FNP  torsemide  (DEMADEX ) 10 MG tablet Take 10 mg by mouth. 12/24/23  Yes [provider]  ZEPBOUND  5 MG/0.5ML Pen Inject 5 mg into the skin once a week. 02/11/24  Yes [provider]  acetaminophen  (TYLENOL ) 325 MG tablet Take 650 mg by mouth every 6 (six) hours as needed for mild pain or headache.    [provider]  furosemide  (LASIX ) 40 MG tablet Take 2 tablets (80 mg total) by mouth 2 (two) times daily. 10/22/23   Milford, Harlene HERO, FNP  gabapentin  (NEURONTIN ) 300 MG capsule Take 1 capsule (300 mg total) by mouth at bedtime. 01/15/24   Henson, Vickie L, NP-C  NON FORMULARY Pt uses a cpap nightly    [provider]  ondansetron  (ZOFRAN -ODT) 4 MG disintegrating tablet Take 1 tablet (4 mg total) by mouth every 8 (eight) hours as needed for nausea or vomiting. 02/11/24   Bensimhon, Toribio SAUNDERS, MD  tirzepatide  (MOUNJARO ) 2.5 MG/0.5ML Pen Inject 2.5 mg into the skin once a week. Please d/c the 5mg  dose 02/11/24   Bensimhon, Toribio SAUNDERS, MD    Family History Family History  Problem Relation Age of Onset   Epilepsy Mother    Asthma Mother    Vision loss Mother    Heart disease Father    Cancer Paternal Grandmother        BREAST   Cancer Maternal Aunt 22       BREAST   Hyperlipidemia Maternal Aunt    Hypertension Maternal Grandmother    Hearing loss Maternal Grandmother    Kidney disease Maternal Grandmother    Cancer Maternal Grandfather    Hearing loss Maternal Grandfather    Heart disease Paternal Grandfather    Hearing loss Maternal Uncle    Hearing loss Maternal Aunt     Social History Social History   Tobacco Use   Smoking status: Never    Passive exposure: Never   Smokeless tobacco: Never  Vaping Use    Vaping status: Never Used  Substance Use Topics   Alcohol use: No   Drug use: No     Allergies   Patient has no known allergies.   Review of Systems Review of Systems  Respiratory:  Positive for cough.   Neurological:  Positive for weakness.     Physical Exam Triage Vital Signs ED Triage Vitals  Encounter Vitals Group     BP 02/28/24 1535 119/83     Girls Systolic BP Percentile --  Girls Diastolic BP Percentile --      Boys Systolic BP Percentile --      Boys Diastolic BP Percentile --      Pulse Rate 02/28/24 1535 97     Resp 02/28/24 1535 20     Temp 02/28/24 1535 (!) 101.1 F (38.4 C)     Temp Source 02/28/24 1535 Oral     SpO2 02/28/24 1535 96 %     Weight 02/28/24 1532 245 lb (111.1 kg)     Height 02/28/24 1532 5' 2 (1.575 m)     Head Circumference --      Peak Flow --      Pain Score 02/28/24 1529 6     Pain Loc --      Pain Education --      Exclude from Growth Chart --    No data found.  Updated Vital Signs BP 119/83 (BP Location: Left Arm)   Pulse 97   Temp (!) 101.1 F (38.4 C) (Oral)   Resp 20   Ht 5' 2 (1.575 m)   Wt 111.1 kg   LMP  (LMP Unknown)   SpO2 96%   BMI 44.81 kg/m   Visual Acuity Right Eye Distance:   Left Eye Distance:   Bilateral Distance:    Right Eye Near:   Left Eye Near:    Bilateral Near:     Physical Exam Vitals reviewed.  Constitutional:      General: She is not in acute distress.    Appearance: She is not toxic-appearing.  HENT:     Right Ear: Tympanic membrane and ear canal normal.     Left Ear: Tympanic membrane and ear canal normal.     Nose: Congestion present.     Mouth/Throat:     Mouth: Mucous membranes are moist.     Comments: There is clear mucus draining Eyes:     Extraocular Movements: Extraocular movements intact.     Conjunctiva/sclera: Conjunctivae normal.     Pupils: Pupils are equal, round, and reactive to light.  Cardiovascular:     Rate and Rhythm: Normal rate and regular rhythm.      Heart sounds: No murmur heard. Pulmonary:     Effort: No respiratory distress.     Breath sounds: No stridor. No wheezing, rhonchi or rales.  Musculoskeletal:     Cervical back: Neck supple.  Lymphadenopathy:     Cervical: No cervical adenopathy.  Skin:    Capillary Refill: Capillary refill takes less than 2 seconds.     Coloration: Skin is not jaundiced or pale.  Neurological:     General: No focal deficit present.     Mental Status: She is alert and oriented to person, place, and time.  Psychiatric:        Behavior: Behavior normal.      UC Treatments / Results  Labs (all labs ordered are listed, but only abnormal results are displayed) Labs Reviewed  POC COVID19/FLU A&B COMBO - Normal    EKG   Radiology No results found.  Procedures Procedures (including critical care time)  Medications Ordered in UC Medications  acetaminophen  (TYLENOL ) tablet 650 mg (650 mg Oral Given 02/28/24 1539)    Initial Impression / Assessment and Plan / UC Course  I have reviewed the triage vital signs and the nursing notes.  Pertinent labs & imaging results that were available during my care of the patient were reviewed by me and considered in my medical decision making (  see chart for details).     Testing for flu and COVID is negative.  Tessalon  Perles were sent in for the cough. Final Clinical Impressions(s) / UC Diagnoses   Final diagnoses:  Viral URI     Discharge Instructions      The testing for flu and COVID was negative  Take benzonatate  100 mg, 1 tab every 8 hours as needed for cough.  Continue Tylenol  500 mg--2 every 6 hours as needed for pain or fever.  You were given 1 dose of this medication here in the clinic.  Make sure you are drinking plenty of fluid     ED Prescriptions   None    PDMP not reviewed this encounter.   Vonna Sharlet POUR, MD 02/28/24 [phone]

## 2024-02-28 NOTE — Telephone Encounter (Signed)
 Prescription sent for Tara Bradshaw  Perles that I forgot to send in her encounter when she was here earlier.

## 2024-02-28 NOTE — Discharge Instructions (Addendum)
 The testing for flu and COVID was negative  Take benzonatate  100 mg, 1 tab every 8 hours as needed for cough.  Continue Tylenol  500 mg--2 every 6 hours as needed for pain or fever.  You were given 1 dose of this medication here in the clinic.  Make sure you are drinking plenty of fluid

## 2024-02-28 NOTE — Telephone Encounter (Signed)
 Then pt wanted liquid cough syrup instead. Promethazine  DM sent in

## 2024-03-02 ENCOUNTER — Ambulatory Visit: Admitting: Family Medicine

## 2024-03-02 ENCOUNTER — Other Ambulatory Visit (HOSPITAL_COMMUNITY): Payer: Self-pay

## 2024-03-02 ENCOUNTER — Ambulatory Visit (INDEPENDENT_AMBULATORY_CARE_PROVIDER_SITE_OTHER)

## 2024-03-02 ENCOUNTER — Ambulatory Visit: Attending: Cardiology

## 2024-03-02 VITALS — Ht 62.0 in | Wt 245.0 lb

## 2024-03-02 DIAGNOSIS — Z95810 Presence of automatic (implantable) cardiac defibrillator: Secondary | ICD-10-CM

## 2024-03-02 DIAGNOSIS — Z0000 Encounter for general adult medical examination without abnormal findings: Secondary | ICD-10-CM

## 2024-03-02 DIAGNOSIS — I5022 Chronic systolic (congestive) heart failure: Secondary | ICD-10-CM | POA: Diagnosis not present

## 2024-03-02 NOTE — Patient Instructions (Addendum)
 Tara Bradshaw,  Thank you for taking the time for your Medicare Wellness Visit. I appreciate your continued commitment to your health goals. Please review the care plan we discussed, and feel free to reach out if I can assist you further.  Please note that Annual Wellness Visits do not include a physical exam. Some assessments may be limited, especially if the visit was conducted virtually. If needed, we may recommend an in-person follow-up with your provider.  Ongoing Care Seeing your primary care provider every 3 to 6 months helps us  monitor your health and provide consistent, personalized care.   Referrals If a referral was made during today's visit and you haven't received any updates within two weeks, please contact the referred provider directly to check on the status.  Recommended Screenings:  Health Maintenance  Topic Date Due   COVID-19 Vaccine (1) Never done   DTaP/Tdap/Td vaccine (1 - Tdap) Never done   Pneumococcal Vaccine (1 of 2 - PCV) Never done   Hepatitis B Vaccine (1 of 3 - 19+ 3-dose series) Never done   HPV Vaccine (1 - 3-dose SCDM series) Never done   Breast Cancer Screening  01/04/2024   Flu Shot  07/07/2024*   Medicare Annual Wellness Visit  03/02/2025   Pap with HPV screening  07/09/2028   Hepatitis C Screening  Completed   HIV Screening  Completed   Meningitis B Vaccine  Aged Out  *Topic was postponed. The date shown is not the original due date.       03/02/2024   12:37 PM  Advanced Directives  Does Patient Have a Medical Advance Directive? No  Would patient like information on creating a medical advance directive? No - Patient declined    Vision: Annual vision screenings are recommended for early detection of glaucoma, cataracts, and diabetic retinopathy. These exams can also reveal signs of chronic conditions such as diabetes and high blood pressure.  Dental: Annual dental screenings help detect early signs of oral cancer, gum disease, and other  conditions linked to overall health, including heart disease and diabetes.

## 2024-03-02 NOTE — Progress Notes (Addendum)
 Chief Complaint  Patient presents with   Medicare Wellness     Subjective:   Tara Bradshaw is a 38 y.o. female who presents for a Medicare Annual Wellness Visit.  I connected with  Tara Bradshaw on 03/02/24 by a audio enabled telemedicine application and verified that I am speaking with the correct person using two identifiers.  Patient Location: Home  Provider Location: Office/Clinic  Persons Participating in Visit: Patient.  I discussed the limitations of evaluation and management by telemedicine. The patient expressed understanding and agreed to proceed.  Vital Signs: Because this visit was a virtual/telehealth visit, some criteria may be missing or patient reported. Any vitals not documented were not able to be obtained and vitals that have been documented are patient reported.   Allergies (verified) Patient has no known allergies.   History: Past Medical History:  Diagnosis Date   AICD (automatic cardioverter/defibrillator) present    Allergy    Anemia    CHRONIC   CHF (congestive heart failure) (HCC)    Chronic systolic dysfunction of left ventricle    GERD (gastroesophageal reflux disease)    WITH PREGNANCY   Nonischemic cardiomyopathy (HCC)    Pneumonia    x 1   PVC's (premature ventricular contractions)    Sleep apnea    Past Surgical History:  Procedure Laterality Date   CARDIAC CATHETERIZATION     DILATION AND EVACUATION N/A 11/30/2016   Procedure: DILATATION AND EVACUATION;  Surgeon: Fredirick Glenys RAMAN, MD;  Location: [facility];  Service: Gynecology;  Laterality: N/A;   ICD IMPLANT N/A 02/19/2019   Procedure: ICD IMPLANT;  Surgeon: Kelsie Agent, MD;  Location: [facility];  Service: Cardiovascular;  Laterality: N/A;   LEAD REVISION/REPAIR N/A 04/16/2019   Procedure: LEAD REVISION/REPAIR;  Surgeon: Kelsie Agent, MD;  Location: [facility];  Service: Cardiovascular;  Laterality: N/A;   RIGHT HEART CATH N/A 04/24/2022   Procedure: RIGHT HEART  CATH;  Surgeon: Cherrie Toribio SAUNDERS, MD;  Location: [facility];  Service: Cardiovascular;  Laterality: N/A;   RIGHT/LEFT HEART CATH AND CORONARY ANGIOGRAPHY N/A 01/22/2019   Procedure: RIGHT/LEFT HEART CATH AND CORONARY ANGIOGRAPHY;  Surgeon: Cherrie Toribio SAUNDERS, MD;  Location: [facility];  Service: Cardiovascular;  Laterality: N/A;   WISDOM TOOTH EXTRACTION     NO ANESTHESIA   Family History  Problem Relation Age of Onset   Epilepsy Mother    Asthma Mother    Vision loss Mother    Heart disease Father    Cancer Paternal Grandmother        BREAST   Cancer Maternal Aunt 42       BREAST   Hyperlipidemia Maternal Aunt    Hypertension Maternal Grandmother    Hearing loss Maternal Grandmother    Kidney disease Maternal Grandmother    Cancer Maternal Grandfather    Hearing loss Maternal Grandfather    Heart disease Paternal Grandfather    Hearing loss Maternal Uncle    Hearing loss Maternal Aunt    Social History   Occupational History   Occupation: SECURITY OFFICER    Employer: [employer]  Tobacco Use   Smoking status: Never    Passive exposure: Never   Smokeless tobacco: Never  Vaping Use   Vaping status: Never Used  Substance and Sexual Activity   Alcohol use: No   Drug use: No   Sexual activity: Yes    Partners: Male    Birth control/protection: I.U.D.  Tobacco Counseling Counseling given: Not Answered  SDOH Screenings   Food Insecurity: No Food Insecurity (03/02/2024)  Housing: Low Risk  (03/02/2024)  Transportation Needs: No Transportation Needs (03/02/2024)  Utilities: Not At Risk (03/02/2024)  Depression (PHQ2-9): Low Risk  (03/02/2024)  Financial Resource Strain: Low Risk  (02/28/2024)  Physical Activity: Inactive (03/02/2024)  Social Connections: Moderately Integrated (03/02/2024)  Stress: No Stress Concern Present (03/02/2024)  Tobacco Use: Low Risk  (03/02/2024)  Health Literacy: Adequate Health Literacy (03/02/2024)    See flowsheets for full screening details  Depression Screen PHQ 2 & 9 Depression Scale- Over the past 2 weeks, how often have you been bothered by any of the following problems? Little interest or pleasure in doing things: 0 Feeling down, depressed, or hopeless (PHQ Adolescent also includes...irritable): 0 PHQ-2 Total Score: 0 Trouble falling or staying asleep, or sleeping too much: 0 Feeling tired or having little energy: 0 Poor appetite or overeating (PHQ Adolescent also includes...weight loss): 0 Feeling bad about yourself - or that you are a failure or have let yourself or your family down: 0 Trouble concentrating on things, such as reading the newspaper or watching television (PHQ Adolescent also includes...like school work): 0 Moving or speaking so slowly that other people could have noticed. Or the opposite - being so fidgety or restless that you have been moving around a lot more than usual: 0 Thoughts that you would be better off dead, or of hurting yourself in some way: 0 PHQ-9 Total Score: 0 If you checked off any problems, how difficult have these problems made it for you to do your work, take care of things at home, or get along with other people?: Not difficult at all  Depression Treatment Depression Interventions/Treatment : EYV7-0 Score <4 Follow-up Not Indicated     Goals Addressed               This Visit's Progress     Patient Stated (pt-stated)        Patient stated she plans to continue managing her weight and manage diet       Visit info / Clinical Intake: Medicare Wellness Visit Type:: Initial Annual Wellness Visit Persons participating in visit:: patient Medicare Wellness Visit Mode:: Telephone If telephone:: video declined Because this visit was a virtual/telehealth visit:: vitals recorded from last visit If Telephone or Video please confirm:: I connected with the patient using audio enabled telemedicine application and verified that I am  speaking with the correct person using two identifiers; I discussed the limitations of evaluation and management by telemedicine; The patient expressed understanding and agreed to proceed Patient Location:: Home Provider Location:: Office Information given by:: patient Interpreter Needed?: No Pre-visit prep was completed: yes AWV questionnaire completed by patient prior to visit?: yes Date:: 02/28/24 Living arrangements:: with family/others (has children) Patient's Overall Health Status Rating: good Typical amount of pain: none Does pain affect daily life?: no Are you currently prescribed opioids?: no  Dietary Habits and Nutritional Risks How many meals a day?: 2 Eats fruit and vegetables daily?: yes Most meals are obtained by: preparing own meals In the last 2 weeks, have you had any of the following?: none Diabetic:: no  Functional Status Activities of Daily Living (to include ambulation/medication): Independent Ambulation: Independent with device- listed below Home Assistive Devices/Equipment: CPAP Medication Administration: Independent Home Management: Independent Manage your own finances?: yes Primary transportation is: driving Concerns about vision?: no *vision screening is required for WTM* Concerns about hearing?: no  Fall Screening Falls  in the past year?: 0 Number of falls in past year: 0 Was there an injury with Fall?: 0 Fall Risk Category Calculator: 0 Patient Fall Risk Level: Low Fall Risk  Fall Risk Patient at Risk for Falls Due to: No Fall Risks Fall risk Follow up: Falls evaluation completed; Falls prevention discussed  Home and Transportation Safety: All rugs have non-skid backing?: yes All stairs or steps have railings?: yes (outside) Grab bars in the bathtub or shower?: (!) no Have non-skid surface in bathtub or shower?: yes Good home lighting?: yes Regular seat belt use?: yes Hospital stays in the last year:: no  Cognitive Assessment Difficulty  concentrating, remembering, or making decisions? : no Will 6CIT or Mini Cog be Completed: yes What year is it?: 0 points What month is it?: 0 points Give patient an address phrase to remember (5 components): [address] [place] About what time is it?: 0 points Count backwards from 20 to 1: 0 points Say the months of the year in reverse: 0 points Repeat the address phrase from earlier: 2 points (Drive) 6 CIT Score: 2 points  Advance Directives (For Healthcare) Does Patient Have a Medical Advance Directive?: No Would patient like information on creating a medical advance directive?: No - Patient declined  Reviewed/Updated  Reviewed/Updated: Reviewed All (Medical, Surgical, Family, Medications, Allergies, Care Teams, Patient Goals)        Objective:    Today's Vitals   03/02/24 1248  Weight: 245 lb (111.1 kg)  Height: 5' 2 (1.575 m)   Body mass index is 44.81 kg/m.  Current Medications (verified) Outpatient Encounter Medications as of 03/02/2024  Medication Sig   acetaminophen  (TYLENOL ) 325 MG tablet Take 650 mg by mouth every 6 (six) hours as needed for mild pain or headache.   benzonatate  (TESSALON ) 100 MG capsule Take 1 capsule (100 mg total) by mouth 3 (three) times daily as needed for cough.   FARXIGA  10 MG TABS tablet TAKE 1 TABLET BY MOUTH DAILY  BEFORE BREAKFAST   furosemide  (LASIX ) 40 MG tablet Take 2 tablets (80 mg total) by mouth 2 (two) times daily.   gabapentin  (NEURONTIN ) 300 MG capsule Take 1 capsule (300 mg total) by mouth at bedtime.   levonorgestrel  (MIRENA ) 20 MCG/24HR IUD 1 each by Intrauterine route once.   meclizine  (ANTIVERT ) 25 MG tablet Take 1 tablet (25 mg total) by mouth 3 (three) times daily as needed for dizziness.   Multiple Vitamins-Minerals (WOMENS MULTIVITAMIN PO) Take 1 tablet by mouth daily.   NON FORMULARY Pt uses a cpap nightly   ondansetron  (ZOFRAN -ODT) 4 MG disintegrating tablet Take 1 tablet (4 mg total) by mouth every 8 (eight)  hours as needed for nausea or vomiting.   potassium chloride  SA (KLOR-CON  M) 20 MEQ tablet TAKE 2 TABLETS BY MOUTH DAILY   promethazine -dextromethorphan (PROMETHAZINE -DM) 6.25-15 MG/5ML syrup Take 5 mLs by mouth 4 (four) times daily as needed for cough.   sacubitril -valsartan  (ENTRESTO ) 97-103 MG TAKE 1 TABLET BY MOUTH TWICE  DAILY   spironolactone  (ALDACTONE ) 25 MG tablet Take 1 tablet (25 mg total) by mouth daily.   tirzepatide  (MOUNJARO ) 2.5 MG/0.5ML Pen Inject 2.5 mg into the skin once a week. Please d/c the 5mg  dose   torsemide  (DEMADEX ) 10 MG tablet Take 10 mg by mouth.   ZEPBOUND  5 MG/0.5ML Pen Inject 5 mg into the skin once a week.   No facility-administered encounter medications on file as of 03/02/2024.   Hearing/Vision screen Hearing Screening - Comments:: Denies  hearing difficulties   Vision Screening - Comments:: Denies vision concerns Immunizations and Health Maintenance Health Maintenance  Topic Date Due   COVID-19 Vaccine (1) Never done   DTaP/Tdap/Td (1 - Tdap) Never done   Pneumococcal Vaccine (1 of 2 - PCV) Never done   Hepatitis B Vaccines 19-59 Average Risk (1 of 3 - 19+ 3-dose series) Never done   HPV VACCINES (1 - 3-dose SCDM series) Never done   Mammogram  01/04/2024   Influenza Vaccine  07/07/2024 (Originally 11/08/2023)   Medicare Annual Wellness (AWV)  03/02/2025   Cervical Cancer Screening (HPV/Pap Cotest)  07/09/2028   Hepatitis C Screening  Completed   HIV Screening  Completed   Meningococcal B Vaccine  Aged Out        Assessment/Plan:  This is a routine wellness examination for Tara Bradshaw.  I have recommended that this patient have a Flu shot but she declines at this time. I have discussed the risks and benefits of this procedure with her. The patient verbalizes understanding.   Patient Care Team: Lendia Boby CROME, NP-C as PCP - General (Family Medicine) Cindie Ole DASEN, MD as PCP - Electrophysiology (Cardiology)  I have personally reviewed and  noted the following in the patient's chart:   Medical and social history Use of alcohol, tobacco or illicit drugs  Current medications and supplements including opioid prescriptions. Functional ability and status Nutritional status Physical activity Advanced directives List of other physicians Hospitalizations, surgeries, and ER visits in previous 12 months Vitals Screenings to include cognitive, depression, and falls Referrals and appointments  No orders of the defined types were placed in this encounter.  In addition, I have reviewed and discussed with patient certain preventive protocols, quality metrics, and best practice recommendations. A written personalized care plan for preventive services as well as general preventive health recommendations were provided to patient.   Tara Bradshaw, CMA   03/02/2024   Return in 1 year (on 03/02/2025).  After Visit Summary: (MyChart) Due to this being a telephonic visit, the after visit summary with patients personalized plan was offered to patient via MyChart   Nurse Notes:

## 2024-03-03 NOTE — Progress Notes (Signed)
 EPIC Encounter for ICM Monitoring  Patient Name: Tara Bradshaw is a 38 y.o. female Date: 03/03/2024 Primary Care Physican: Lendia Boby CROME, NP-C Primary Cardiologist: Bensimhon Electrophysiologist: Cindie 03/01/2023 Office Weight: 261 lbs 07/10/2023 Weight: 261 lbs 09/17/2023 Weight: 268 lbs 10/22/2023 Office Weight: 254 lbs   NSVT 11                Attempted call to patient and unable to reach.   Transmission results reviewed.   Urgent care on 02/28/2024 for viral URI.   Corvue thoracic impedance suggesting possible dryness starting 02/19/2024 and trending back to baseline.  Suggesting possible fluid accumulation starting 01/30/2024-02/11/2024.       Prescribed: Furosemide  40 mg take 2 tablet(s) (80 mg total) by mouth twice a day.   Potassium 20 mEq take 3 tablet(s) (60 mg total) by mouth daily.   Labs: 11/29/2023 Creatinine 0.70, BUN 6, Potassium 3.9, Sodium 141, GFR >60  11/20/2023 Creatinine 0.62, BUN 5, Potassium 3.3, Sodium 138, GFR >60  10/22/2023 Creatinine 0.67, BUN 6, Potassium 4.1, Sodium 139, GFR >60 08/21/2023 Creatinine 0.70, BUN 9, Potassium 4.0, Sodium 138, GFR 108.79 06/14/2023 Creatinine 0.44, BUN 9, Potassium 3.7, Sodium 139, GFR <60, BNP 791.5 11/15/2022 Creatinine 0.79, BUN 9, Potassium 4.4, Sodium 136, GFR >60  A complete set of results can be found in Results Review.   Recommendations:  Unable to reach.     Follow-up plan: ICM clinic phone appointment on 04/13/2024.   91 day device clinic remote transmission 05/05/2023.       EP/Cardiology Office Visits:  Recall 01/20/2024 with Dr Cherrie.    Recall 02/16/2024 with Jodie Passey, PA.   Copy of ICM check sent to Dr. Cindie.   Remote monitoring is medically necessary for Heart Failure Management.    Daily Thoracic Impedance ICM trend: 12/03/2023 through 03/02/2024.    12-14 Month Thoracic Impedance ICM trend:     Tara GORMAN Garner, RN 03/03/2024 4:31 PM

## 2024-03-13 ENCOUNTER — Ambulatory Visit
Admission: RE | Admit: 2024-03-13 | Discharge: 2024-03-13 | Disposition: A | Source: Ambulatory Visit | Attending: Family Medicine | Admitting: Family Medicine

## 2024-03-13 DIAGNOSIS — Z1231 Encounter for screening mammogram for malignant neoplasm of breast: Secondary | ICD-10-CM

## 2024-03-20 ENCOUNTER — Ambulatory Visit: Admitting: Family Medicine

## 2024-03-28 ENCOUNTER — Encounter: Payer: Self-pay | Admitting: Pharmacist

## 2024-04-03 MED ORDER — ZEPBOUND 5 MG/0.5ML ~~LOC~~ SOAJ: 5.0000 mg | SUBCUTANEOUS | 3 refills | Status: AC

## 2024-04-04 ENCOUNTER — Telehealth: Admitting: Nurse Practitioner

## 2024-04-04 DIAGNOSIS — J069 Acute upper respiratory infection, unspecified: Secondary | ICD-10-CM

## 2024-04-04 MED ORDER — AZITHROMYCIN 250 MG PO TABS: ORAL_TABLET | ORAL | 0 refills | Status: AC

## 2024-04-04 MED ORDER — BENZONATATE 200 MG PO CAPS: 200.0000 mg | ORAL_CAPSULE | Freq: Two times a day (BID) | ORAL | 0 refills | Status: AC | PRN

## 2024-04-04 NOTE — Progress Notes (Signed)
 We are sorry that you are not feeling well.  Here is how we plan to help!  Based on your presentation I believe you most likely have A cough due to bacteria.  When patients have a fever and a productive cough with a change in color or increased sputum production, we are concerned about bacterial bronchitis.  If left untreated it can progress to pneumonia.  If your symptoms do not improve with your treatment plan it is important that you contact your provider.   I have prescribed Azithromyin 250 mg: two tablets now and then one tablet daily for 4 additonal days    In addition you may use promethazine  dm    From your responses in the eVisit questionnaire you describe inflammation in the upper respiratory tract which is causing a significant cough.  This is commonly called Bronchitis and has four common causes:   Allergies Viral Infections Acid Reflux Bacterial Infection Allergies, viruses and acid reflux are treated by controlling symptoms or eliminating the cause. An example might be a cough caused by taking certain blood pressure medications. You stop the cough by changing the medication. Another example might be a cough caused by acid reflux. Controlling the reflux helps control the cough.  USE OF BRONCHODILATOR (RESCUE) INHALERS: There is a risk from using your bronchodilator too frequently.  The risk is that over-reliance on a medication which only relaxes the muscles surrounding the breathing tubes can reduce the effectiveness of medications prescribed to reduce swelling and congestion of the tubes themselves.  Although you feel brief relief from the bronchodilator inhaler, your asthma may actually be worsening with the tubes becoming more swollen and filled with mucus.  This can delay other crucial treatments, such as oral steroid medications. If you need to use a bronchodilator inhaler daily, several times per day, you should discuss this with your provider.  There are probably better  treatments that could be used to keep your asthma under control.     HOME CARE Only take medications as instructed by your medical team. Complete the entire course of an antibiotic. Drink plenty of fluids and get plenty of rest. Avoid close contacts especially the very young and the elderly Cover your mouth if you cough or cough into your sleeve. Always remember to wash your hands A steam or ultrasonic humidifier can help congestion.   GET HELP RIGHT AWAY IF: You develop worsening fever. You become short of breath You cough up blood. Your symptoms persist after you have completed your treatment plan MAKE SURE YOU  Understand these instructions. Will watch your condition. Will get help right away if you are not doing well or get worse.  Your e-visit answers were reviewed by a board certified advanced clinical practitioner to complete your personal care plan.  Depending on the condition, your plan could have included both over the counter or prescription medications. If there is a problem please reply  once you have received a response from your provider. Your safety is important to us .  If you have drug allergies check your prescription carefully.    You can use MyChart to ask questions about todays visit, request a non-urgent call back, or ask for a work or school excuse for 24 hours related to this e-Visit. If it has been greater than 24 hours you will need to follow up with your provider, or enter a new e-Visit to address those concerns. You will get an e-mail in the next two days asking about your experience.  I hope that your e-visit has been valuable and will speed your recovery. Thank you for using e-visits.   I have spent 5 minutes in review of e-visit questionnaire, review and updating patient chart, medical decision making and response to patient.   Izaiah Tabb, FNP

## 2024-04-09 ENCOUNTER — Telehealth: Admitting: Physician Assistant

## 2024-04-09 DIAGNOSIS — J069 Acute upper respiratory infection, unspecified: Secondary | ICD-10-CM

## 2024-04-10 MED ORDER — PREDNISONE 20 MG PO TABS: 40.0000 mg | ORAL_TABLET | Freq: Every day | ORAL | 0 refills | Status: DC

## 2024-04-10 MED ORDER — ALBUTEROL SULFATE HFA 108 (90 BASE) MCG/ACT IN AERS: 1.0000 | INHALATION_SPRAY | Freq: Four times a day (QID) | RESPIRATORY_TRACT | 0 refills | Status: DC | PRN

## 2024-04-10 MED ORDER — FLUTICASONE PROPIONATE 50 MCG/ACT NA SUSP: 2.0000 | Freq: Every day | NASAL | 0 refills | Status: DC

## 2024-04-10 NOTE — Progress Notes (Signed)

## 2024-04-13 ENCOUNTER — Ambulatory Visit

## 2024-04-13 DIAGNOSIS — I5022 Chronic systolic (congestive) heart failure: Secondary | ICD-10-CM | POA: Diagnosis not present

## 2024-04-13 DIAGNOSIS — Z95810 Presence of automatic (implantable) cardiac defibrillator: Secondary | ICD-10-CM

## 2024-04-14 ENCOUNTER — Other Ambulatory Visit: Payer: Self-pay | Admitting: Cardiology

## 2024-04-14 NOTE — Progress Notes (Signed)
 EPIC Encounter for ICM Monitoring  Patient Name: Tara Bradshaw is a 39 y.o. female Date: 04/14/2024 Primary Care Physican: Lendia Boby CROME, NP-C Primary Cardiologist: Bensimhon Electrophysiologist: Mealor 03/01/2023 Office Weight: 261 lbs 07/10/2023 Weight: 261 lbs 09/17/2023 Weight: 268 lbs 10/22/2023 Office Weight: 254 lbs   NSVT 30                Spoke with patient and heart failure questions reviewed.  Transmission results reviewed.  Pt has been having some respiratory cough for a month and physicians have not figured out why.    Since 03/02/2024 ICM remote transmission: Corvue thoracic impedance suggesting possible fluid accumulation from 03/06/2024-03/22/2024 and possible dryness from 03/29/2024-04/11/2023.       Prescribed: Furosemide  40 mg take 2 tablet(s) (80 mg total) by mouth twice a day.   Potassium 20 mEq take 3 tablet(s) (60 mg total) by mouth daily.   Labs: 11/29/2023 Creatinine 0.70, BUN 6, Potassium 3.9, Sodium 141, GFR >60  11/20/2023 Creatinine 0.62, BUN 5, Potassium 3.3, Sodium 138, GFR >60  10/22/2023 Creatinine 0.67, BUN 6, Potassium 4.1, Sodium 139, GFR >60 08/21/2023 Creatinine 0.70, BUN 9, Potassium 4.0, Sodium 138, GFR 108.79 06/14/2023 Creatinine 0.44, BUN 9, Potassium 3.7, Sodium 139, GFR <60, BNP 791.5 11/15/2022 Creatinine 0.79, BUN 9, Potassium 4.4, Sodium 136, GFR >60  A complete set of results can be found in Results Review.   Recommendations:  No changes and encouraged to call if experiencing any fluid symptoms.   Follow-up plan: ICM clinic phone appointment on 05/14/2024.   91 day device clinic remote transmission 05/05/2023.       EP/Cardiology Office Visits:  Recall 01/20/2024 with Dr Cherrie.  Recall 02/16/2024 with Jodie Passey, PA. She will call to schedule appt with Dr Cherrie.   Copy of ICM check sent to Dr. Nancey.   Remote monitoring is medically necessary for Heart Failure Management.    Daily Thoracic Impedance ICM trend: 01/14/2024  through 04/14/2024.    12-14 Month Thoracic Impedance ICM trend:     Mitzie GORMAN Garner, RN 04/14/2024 4:44 PM

## 2024-04-15 ENCOUNTER — Encounter: Payer: Self-pay | Admitting: Family Medicine

## 2024-04-15 ENCOUNTER — Other Ambulatory Visit: Payer: Self-pay | Admitting: Family Medicine

## 2024-04-15 MED ORDER — GABAPENTIN 300 MG PO CAPS: 300.0000 mg | ORAL_CAPSULE | Freq: Every day | ORAL | 0 refills | Status: AC

## 2024-04-15 NOTE — Telephone Encounter (Signed)
 Copied from CRM [id number]. Topic: Clinical - Medication Refill >> Apr 15, 2024  3:26 PM Mesmerise C wrote: Medication: gabapentin  (NEURONTIN ) 300 MG capsule  Has the patient contacted their pharmacy? Yes (Agent: If no, request that the patient contact the pharmacy for the refill. If patient does not wish to contact the pharmacy document the reason why and proceed with request.) (Agent: If yes, when and what did the pharmacy advise?)  This is the patient's preferred pharmacy:  [id number] [place],  - [address] AT [facility] & [address] [place] [address] Phone: [phone] Fax: [phone]  Is this the correct pharmacy for this prescription? Yes If no, delete pharmacy and type the correct one.   Has the prescription been filled recently? No  Is the patient out of the medication? Yes  Has the patient been seen for an appointment in the last year OR does the patient have an upcoming appointment? Yes  Can we respond through MyChart? Yes  Agent: Please be advised that Rx refills may take up to 3 business days. We ask that you follow-up with your pharmacy.

## 2024-04-16 ENCOUNTER — Other Ambulatory Visit: Payer: Self-pay | Admitting: Family Medicine

## 2024-04-16 ENCOUNTER — Encounter: Payer: Self-pay | Admitting: Family Medicine

## 2024-04-16 ENCOUNTER — Ambulatory Visit (INDEPENDENT_AMBULATORY_CARE_PROVIDER_SITE_OTHER): Admitting: Family Medicine

## 2024-04-16 VITALS — BP 124/74 | HR 86 | Temp 97.6°F | Ht 62.0 in | Wt 243.0 lb

## 2024-04-16 DIAGNOSIS — R058 Other specified cough: Secondary | ICD-10-CM | POA: Diagnosis not present

## 2024-04-16 DIAGNOSIS — K219 Gastro-esophageal reflux disease without esophagitis: Secondary | ICD-10-CM

## 2024-04-16 MED ORDER — PANTOPRAZOLE SODIUM 40 MG PO TBEC: 40.0000 mg | DELAYED_RELEASE_TABLET | Freq: Every day | ORAL | 1 refills | Status: AC

## 2024-04-16 NOTE — Patient Instructions (Signed)
 Your cough appears to be related to acid reflux.   Try the acid medication I prescribed.  It is called pantoprazole  (Protonix ).  Avoid foods high in acid, spicy foods and beverages.   Avoid eating or drinking and laying down within 2 hours.  Let me know if you have any new or worsening symptoms or if you are not seeing any improvement in 2 weeks.

## 2024-04-16 NOTE — Progress Notes (Signed)
 Subjective:    Discussed the use of AI scribe software for clinical note transcription with the patient, who gave verbal consent to proceed.  History of Present Illness Tara Bradshaw is a 39 year old female with heart failure who presents with a lingering cough following an acute upper respiratory infection.  Cough and upper respiratory symptoms - Persistent cough for approximately one month following an acute upper respiratory infection - Initial symptoms included severe cough, rhinorrhea, headache, and myalgias - COVID-19 and influenza tests negative - Completed two courses of antibiotics - Prescribed prednisone  but did not take due to concern - States she was advised to avoid albuterol  inhalers due to tachycardia per cardiologist - Currently uses Mucinex  and Delsym with partial relief - Cough is worse at night and occasionally causes emesis of clear mucus - Associated with scratchy throat and intermittent nasal congestion - Gradual improvement in symptoms, with less daytime cough and some nights without coughing - No fever, chills, sneezing, or eye symptoms  Heart failure symptoms and fluid status - Heart failure with ongoing follow-up in heart failure clinic - Sleeps on multiple pillows for comfort and fluid control - No increased peripheral edema, chest pain, or palpitations - Fluid status was elevated in late November to early December with subsequent dehydration, now stable  Gastroesophageal reflux symptoms - Occasional gastroesophageal reflux associated with Zepbound , described as bloating and buildup - History of reflux in pregnancy  - No current dyspepsia     ROS as in subjective.   Objective: Vitals:   04/16/24 0755  BP: 124/74  Pulse: 86  Temp: 97.6 F (36.4 C)  SpO2: 99%    General appearance: Alert, WD/WN, no distress                             Skin: warm, no rash                           Head: no sinus tenderness                            Eyes:  conjunctiva normal, corneas clear, PERRLA                            Ears: pearly TMs, external ear canals normal                          Nose: septum midline, turbinates swollen             Mouth/throat: MMM, tongue normal, mild pharyngeal erythema                           Neck: supple, no adenopathy, no thyromegaly, nontender                          Heart: RRR                         Lungs: CTA bilaterally, no wheezes, rales, or rhonchi      Assessment/Plan:  Assessment and Plan Assessment & Plan Chronic cough due to gastroesophageal reflux disease Chronic cough persisting for about a month, primarily nocturnal, with clear mucus production. No fever, chills, or significant allergy symptoms.  Differential diagnosis includes acid reflux, allergies, and post-viral cough. Likely acid reflux due to symptoms and history of reflux. No current chest pain or palpitations. Lungs are clear on examination. No recent chest x-ray due to improvement in symptoms.  - Initiated acid suppression therapy with pantoprazole . - Held off on chest x-ray unless symptoms do not improve in the next week or two or if she notices any new or worsening symptoms.  - Discontinued prednisone  as it is not needed for current symptoms.

## 2024-04-26 ENCOUNTER — Telehealth: Admitting: Physician Assistant

## 2024-04-26 DIAGNOSIS — R059 Cough, unspecified: Secondary | ICD-10-CM

## 2024-04-26 NOTE — Progress Notes (Signed)
" °  Because of you medical history and current symptoms, of difficulty breathing, wheezing, I feel your condition warrants further evaluation and I recommend that you be seen in a face-to-face visit.  You need an hands on thorough exam for better management of current symptoms.   NOTE: There will be NO CHARGE for this E-Visit   If you are having a true medical emergency, please call 911.     For an urgent face to face visit, [facility] has multiple urgent care centers for your convenience.  Click the link below for the full list of locations and hours, walk-in wait times, appointment scheduling options and driving directions:  Urgent Care - [place], [place], [place], [place], [place], [place]  [place]     Your MyChart E-visit questionnaire answers were reviewed by a board certified advanced clinical practitioner to complete your personal care plan based on your specific symptoms.    Thank you for using e-Visits.    "

## 2024-05-02 ENCOUNTER — Emergency Department (HOSPITAL_BASED_OUTPATIENT_CLINIC_OR_DEPARTMENT_OTHER): Admitting: Radiology

## 2024-05-02 ENCOUNTER — Inpatient Hospital Stay (HOSPITAL_BASED_OUTPATIENT_CLINIC_OR_DEPARTMENT_OTHER)
Admission: EM | Admit: 2024-05-02 | Discharge: 2024-05-08 | DRG: 417 | Disposition: A | Discharge status: Home / Self Care | Attending: Internal Medicine | Admitting: Internal Medicine

## 2024-05-02 ENCOUNTER — Emergency Department (HOSPITAL_BASED_OUTPATIENT_CLINIC_OR_DEPARTMENT_OTHER)

## 2024-05-02 ENCOUNTER — Ambulatory Visit: Admission: EM | Admit: 2024-05-02 | Discharge: 2024-05-02 | Disposition: A | Discharge status: Home / Self Care

## 2024-05-02 ENCOUNTER — Encounter (HOSPITAL_BASED_OUTPATIENT_CLINIC_OR_DEPARTMENT_OTHER): Payer: Self-pay | Admitting: Emergency Medicine

## 2024-05-02 ENCOUNTER — Other Ambulatory Visit: Payer: Self-pay

## 2024-05-02 DIAGNOSIS — Z8249 Family history of ischemic heart disease and other diseases of the circulatory system: Secondary | ICD-10-CM

## 2024-05-02 DIAGNOSIS — E611 Iron deficiency: Secondary | ICD-10-CM | POA: Diagnosis present

## 2024-05-02 DIAGNOSIS — K5900 Constipation, unspecified: Secondary | ICD-10-CM

## 2024-05-02 DIAGNOSIS — R0989 Other specified symptoms and signs involving the circulatory and respiratory systems: Secondary | ICD-10-CM | POA: Diagnosis present

## 2024-05-02 DIAGNOSIS — I493 Ventricular premature depolarization: Secondary | ICD-10-CM | POA: Diagnosis present

## 2024-05-02 DIAGNOSIS — Z95810 Presence of automatic (implantable) cardiac defibrillator: Secondary | ICD-10-CM | POA: Diagnosis present

## 2024-05-02 DIAGNOSIS — F32A Depression, unspecified: Secondary | ICD-10-CM | POA: Diagnosis present

## 2024-05-02 DIAGNOSIS — I10 Essential (primary) hypertension: Secondary | ICD-10-CM

## 2024-05-02 DIAGNOSIS — Z825 Family history of asthma and other chronic lower respiratory diseases: Secondary | ICD-10-CM

## 2024-05-02 DIAGNOSIS — R112 Nausea with vomiting, unspecified: Secondary | ICD-10-CM

## 2024-05-02 DIAGNOSIS — I340 Nonrheumatic mitral (valve) insufficiency: Secondary | ICD-10-CM | POA: Diagnosis present

## 2024-05-02 DIAGNOSIS — I509 Heart failure, unspecified: Secondary | ICD-10-CM

## 2024-05-02 DIAGNOSIS — I272 Pulmonary hypertension, unspecified: Secondary | ICD-10-CM | POA: Diagnosis present

## 2024-05-02 DIAGNOSIS — R140 Abdominal distension (gaseous): Secondary | ICD-10-CM

## 2024-05-02 DIAGNOSIS — K219 Gastro-esophageal reflux disease without esophagitis: Secondary | ICD-10-CM | POA: Diagnosis present

## 2024-05-02 DIAGNOSIS — I428 Other cardiomyopathies: Secondary | ICD-10-CM | POA: Diagnosis not present

## 2024-05-02 DIAGNOSIS — K810 Acute cholecystitis: Secondary | ICD-10-CM | POA: Diagnosis not present

## 2024-05-02 DIAGNOSIS — I5023 Acute on chronic systolic (congestive) heart failure: Secondary | ICD-10-CM

## 2024-05-02 DIAGNOSIS — Z820 Family history of epilepsy and other diseases of the nervous system: Secondary | ICD-10-CM

## 2024-05-02 DIAGNOSIS — Z821 Family history of blindness and visual loss: Secondary | ICD-10-CM

## 2024-05-02 DIAGNOSIS — I110 Hypertensive heart disease with heart failure: Secondary | ICD-10-CM | POA: Diagnosis present

## 2024-05-02 DIAGNOSIS — I5043 Acute on chronic combined systolic (congestive) and diastolic (congestive) heart failure: Secondary | ICD-10-CM | POA: Diagnosis present

## 2024-05-02 DIAGNOSIS — Z79899 Other long term (current) drug therapy: Secondary | ICD-10-CM

## 2024-05-02 DIAGNOSIS — Z803 Family history of malignant neoplasm of breast: Secondary | ICD-10-CM

## 2024-05-02 DIAGNOSIS — R198 Other specified symptoms and signs involving the digestive system and abdomen: Secondary | ICD-10-CM

## 2024-05-02 DIAGNOSIS — Z7984 Long term (current) use of oral hypoglycemic drugs: Secondary | ICD-10-CM

## 2024-05-02 DIAGNOSIS — R6881 Early satiety: Secondary | ICD-10-CM | POA: Diagnosis present

## 2024-05-02 DIAGNOSIS — K8012 Calculus of gallbladder with acute and chronic cholecystitis without obstruction: Principal | ICD-10-CM | POA: Diagnosis present

## 2024-05-02 DIAGNOSIS — I5082 Biventricular heart failure: Secondary | ICD-10-CM | POA: Diagnosis present

## 2024-05-02 DIAGNOSIS — G4733 Obstructive sleep apnea (adult) (pediatric): Secondary | ICD-10-CM | POA: Diagnosis present

## 2024-05-02 DIAGNOSIS — Z6841 Body Mass Index (BMI) 40.0 and over, adult: Secondary | ICD-10-CM

## 2024-05-02 HISTORY — DX: Dyspnea, unspecified: R06.00

## 2024-05-02 LAB — HEPATIC FUNCTION PANEL
ALT: 47 U/L — ABNORMAL HIGH (ref 0–44)
AST: 35 U/L (ref 15–41)
Albumin: 4.3 g/dL (ref 3.5–5.0)
Alkaline Phosphatase: 53 U/L (ref 38–126)
Bilirubin, Direct: 0.3 mg/dL — ABNORMAL HIGH (ref 0.0–0.2)
Indirect Bilirubin: 0.5 mg/dL (ref 0.3–0.9)
Total Bilirubin: 0.9 mg/dL (ref 0.0–1.2)
Total Protein: 6.7 g/dL (ref 6.5–8.1)

## 2024-05-02 LAB — CBC
HCT: 35 % — ABNORMAL LOW (ref 36.0–46.0)
Hemoglobin: 11.1 g/dL — ABNORMAL LOW (ref 12.0–15.0)
MCH: 27.2 pg (ref 26.0–34.0)
MCHC: 31.7 g/dL (ref 30.0–36.0)
MCV: 85.8 fL (ref 80.0–100.0)
Platelets: 165 10*3/uL (ref 150–400)
RBC: 4.08 MIL/uL (ref 3.87–5.11)
RDW: 16 % — ABNORMAL HIGH (ref 11.5–15.5)
WBC: 6.5 10*3/uL (ref 4.0–10.5)
nRBC: 0 % (ref 0.0–0.2)

## 2024-05-02 LAB — LIPASE, BLOOD: Lipase: 17 U/L (ref 11–51)

## 2024-05-02 LAB — BASIC METABOLIC PANEL WITH GFR
Anion gap: 12 (ref 5–15)
BUN: 10 mg/dL (ref 6–20)
CO2: 26 mmol/L (ref 22–32)
Calcium: 9.7 mg/dL (ref 8.9–10.3)
Chloride: 103 mmol/L (ref 98–111)
Creatinine, Ser: 0.74 mg/dL (ref 0.44–1.00)
GFR, Estimated: >60 mL/min
Glucose, Bld: 92 mg/dL (ref 70–99)
Potassium: 3.4 mmol/L — ABNORMAL LOW (ref 3.5–5.1)
Sodium: 141 mmol/L (ref 135–145)

## 2024-05-02 LAB — MAGNESIUM: Magnesium: 1.7 mg/dL (ref 1.7–2.4)

## 2024-05-02 LAB — TROPONIN T, HIGH SENSITIVITY: Troponin T High Sensitivity: 6 ng/L (ref 0–19)

## 2024-05-02 LAB — PREGNANCY, URINE: Preg Test, Ur: NEGATIVE

## 2024-05-02 LAB — PRO BRAIN NATRIURETIC PEPTIDE: Pro Brain Natriuretic Peptide: 3347 pg/mL — ABNORMAL HIGH

## 2024-05-02 MED ORDER — GABAPENTIN 300 MG PO CAPS
300.0000 mg | ORAL_CAPSULE | Freq: Every day | ORAL | Status: DC
  Administered 2024-05-02 – 2024-05-07 (×6): 300 mg via ORAL
  Filled 2024-05-02 (×6): qty 1

## 2024-05-02 MED ORDER — IOHEXOL 300 MG/ML  SOLN
100.0000 mL | Freq: Once | INTRAMUSCULAR | Status: AC | PRN
  Administered 2024-05-02: 100 mL via INTRAVENOUS

## 2024-05-02 MED ORDER — SODIUM CHLORIDE 0.9 % IV SOLN: 2.0000 g | Freq: Once | INTRAVENOUS | Status: DC

## 2024-05-02 MED ORDER — FUROSEMIDE 10 MG/ML IJ SOLN
20.0000 mg | Freq: Once | INTRAMUSCULAR | Status: AC
  Administered 2024-05-02: 20 mg via INTRAVENOUS
  Filled 2024-05-02: qty 2

## 2024-05-02 MED ORDER — PIPERACILLIN-TAZOBACTAM 3.375 G IVPB
3.3750 g | Freq: Once | INTRAVENOUS | Status: AC
  Administered 2024-05-02: 3.375 g via INTRAVENOUS
  Filled 2024-05-02: qty 50

## 2024-05-02 MED ORDER — MORPHINE SULFATE (PF) 2 MG/ML IV SOLN: 2.0000 mg | INTRAVENOUS | Status: DC | PRN

## 2024-05-02 MED ORDER — SACUBITRIL-VALSARTAN 97-103 MG PO TABS
1.0000 | ORAL_TABLET | Freq: Two times a day (BID) | ORAL | Status: DC
  Administered 2024-05-02 – 2024-05-05 (×6): 1 via ORAL
  Filled 2024-05-02 (×7): qty 1

## 2024-05-02 MED ORDER — ACETAMINOPHEN 650 MG RE SUPP: 650.0000 mg | Freq: Four times a day (QID) | RECTAL | Status: DC | PRN

## 2024-05-02 MED ORDER — ONDANSETRON HCL 4 MG/2ML IJ SOLN
4.0000 mg | Freq: Four times a day (QID) | INTRAMUSCULAR | Status: DC | PRN
  Administered 2024-05-02 – 2024-05-03 (×2): 4 mg via INTRAVENOUS
  Filled 2024-05-02 (×2): qty 2

## 2024-05-02 MED ORDER — ONDANSETRON HCL 4 MG/2ML IJ SOLN
INTRAMUSCULAR | Status: AC
  Filled 2024-05-02: qty 2

## 2024-05-02 MED ORDER — OXYCODONE HCL 5 MG PO TABS
5.0000 mg | ORAL_TABLET | ORAL | Status: DC | PRN
  Administered 2024-05-04 – 2024-05-07 (×8): 5 mg via ORAL
  Filled 2024-05-02 (×9): qty 1

## 2024-05-02 MED ORDER — PIPERACILLIN-TAZOBACTAM 3.375 G IVPB
3.3750 g | Freq: Three times a day (TID) | INTRAVENOUS | Status: DC
  Administered 2024-05-02 – 2024-05-04 (×5): 3.375 g via INTRAVENOUS
  Filled 2024-05-02 (×7): qty 50

## 2024-05-02 MED ORDER — ACETAMINOPHEN 325 MG PO TABS
650.0000 mg | ORAL_TABLET | Freq: Four times a day (QID) | ORAL | Status: DC | PRN
  Administered 2024-05-05 – 2024-05-07 (×3): 650 mg via ORAL
  Filled 2024-05-02 (×3): qty 2

## 2024-05-02 NOTE — Assessment & Plan Note (Signed)
 Patient presented with 2 weeks of upper abdominal pain and pressure, nausea/vomiting.  CT abdomen pelvis with contrast showed cholelithiasis with gallbladder wall thickening and Perry cholecystic edema consistent with acute cholecystitis.  No bile duct dilation was seen.  Subsequent right upper quadrant ultrasound showed findings consistent with acute cholecystitis with cholelithiasis, no bile duct dilation. EDP consulted general surgery, recommended admission, IV antibiotics, cardiology clearance for anticipated cholecystectomy. Labs stable without leukocytosis and overall normal LFTs -- Admit to med telemetry floor --Continue IV Zosyn  --N.p.o. after midnight --Will hold off IV fluids, patient mildly edematous with history of CHF --Monitor fever curve, CBC, CMP --Monitor abdominal exam closely --Pain control as needed --IV antiemetics as needed

## 2024-05-02 NOTE — ED Notes (Signed)
 Called [facility] to transport the patient to [facility] rm[id number]

## 2024-05-02 NOTE — Progress Notes (Signed)
 [facility] night cross cover note:   Regarding this patient who is hospitalized with acute cholecystitis, I was notified by the patient's RN  of pt's request for resumption of her home Entresto  300 mg p.o. nightly.   Chart review, most recent potassium level was found to be 3.4, and renal function appears to be at patient's baseline.  Patient request resumption of home Entresto , gabapentin  300 mg p.o. nightly.   Noting that she is here with concern regarding infection in the form of acute cholecystitis, most recent systolic blood pressures running in the 1 teens/90's. No objective hypotension noted per chart review.   Per patient's request, I have resumed her home Entresto  and gabapentin , as above, with plan to closely monitor ensuing blood pressure as well as and serum electrolytes and renal function trend per existing order for CMP to be checked in the morning.      Eva Pore, DO Hospitalist

## 2024-05-02 NOTE — ED Triage Notes (Signed)
" °  Pt presents with c/o SOB and upper abdominal pain. States the SOB is a chronic issue. Although she feels more SOB than normal especially on exertion Also mentions upper abdominal pain/tightness x 2 weeks. Last BM was today but it was abnormal per pt. Has been taking Miralax . In addition to her prescribed lasix  medication in case it was fluid related.     Seen at [facility] sent to eval for SBO or ascites "

## 2024-05-02 NOTE — H&P (Signed)
 "  Telemedicine History and Physical    Patient: Tara Bradshaw FMW:[id number] DOB: Apr 24, 1985 DOA: 05/02/2024 DOS: the patient was seen and examined on 05/02/2024 PCP: Lendia Boby CROME, NP-C  Patient coming from: Home  Referring Provider: Fonda Ruby, PA Telemedicine Provider: Burnard Cunning, DO Provider  Location: [place], [place] Patient Location: [facility] Referring Diagnosis: Acute cholecystitis Patient Name and DOB verified: Tara Bradshaw, May 20, 1985 Patient consented to Telemedicine Evaluation: Yes RN virtual assistant: Nidia Holy, PA Video encounter time and date: 05/02/2024 5:57 PM   Chief Complaint:  Chief Complaint  Patient presents with   Abdominal Pain   Shortness of Breath   HPI: Tara Bradshaw is a 39 y.o. female with medical history significant of nonischemic cardiomyopathy s/p AICD, GERD, PVC's who presented to [facility] ED today referred from urgent care clinic for evaluation of upper abdominal pain.  Patient reports onset about 2 weeks ago that started with mild discomfort in her stomach, she initially attributed to Zepbound  but then it started to get worse.  She reports feeling abdominal pressure like someone sitting on her upper abdomen.  She has had difficulty eating meals due to feeling full quickly and increasing discomfort from the pressure.  She also reports a lot of nausea, some vomiting but only fluid.  She reports constipation as well, no diarrhea.  She has been using Zofran  at home with some relief, is prescribed this due to side effects of Zepbound .  She has been on that medication for about 3 months now.  No recent fevers or chills.  Abdominal pain does worsen after eating, and radiates to her mid back.  She has never had any episodes of symptoms like this before.   She does feel her legs have mild amount of swelling and she has noticed some dyspnea with exertion today.  She denies any chest pain.  She does have intermittent palpitations due to PVCs.   She denies other recent illnesses or acute complaints.  ED course --  Initial vitals -temp 97.6 F, HR 103, later in the 80s, RR 24 later in the upper teens, BP 124/87, SpO2 94-100% on room air Labs obtained including CMP and CBC were notable for potassium 3.4, ALT 47, direct bili 0.3.  CBC without leukocytosis, hemoglobin 11.1. proBNP 3347.0 Troponin normal at 6 Imaging --  -- CT abdomen/pelvis with contrast showed cholelithiasis and gallbladder wall thickening with Abran cholecystic edema consistent with acute cholecystitis.  No but bile duct dilation was seen.  Trace bilateral pleural effusions with patchy ground glass opacities in the both bases consistent with edema versus pneumonia.  Cardiomegaly - Right upper quadrant ultrasound also showed acute cholecystitis with cholelithiasis and no bile duct dilation seen.  EDP discussed case with Dr. Teresa of general surgery who recommended admission and started on IV antibiotics with plan for cholecystectomy pending cardiology clearance.  Patient is therefore admitted to med/tele unit for further evaluation and management as outlined in detail below.  Review of Systems: As mentioned in the history of present illness. All other systems reviewed and are negative.   Past Medical History:  Diagnosis Date   AICD (automatic cardioverter/defibrillator) present    Allergy    Anemia    CHRONIC   CHF (congestive heart failure) (HCC)    Chronic systolic dysfunction of left ventricle    GERD (gastroesophageal reflux disease)    WITH PREGNANCY   Nonischemic cardiomyopathy (HCC)    Pneumonia    x 1   PVC's (premature ventricular contractions)  Sleep apnea    Past Surgical History:  Procedure Laterality Date   CARDIAC CATHETERIZATION     DILATION AND EVACUATION N/A 11/30/2016   Procedure: DILATATION AND EVACUATION;  Surgeon: Fredirick Glenys RAMAN, MD;  Location: [facility];  Service: Gynecology;  Laterality: N/A;   ICD IMPLANT N/A 02/19/2019   Procedure:  ICD IMPLANT;  Surgeon: Kelsie Agent, MD;  Location: [facility]VASIVE [facility];  Service: Cardiovascular;  Laterality: N/A;   LEAD REVISION/REPAIR N/A 04/16/2019   Procedure: LEAD REVISION/REPAIR;  Surgeon: Kelsie Agent, MD;  Location: [facility];  Service: Cardiovascular;  Laterality: N/A;   RIGHT HEART CATH N/A 04/24/2022   Procedure: RIGHT HEART CATH;  Surgeon: Cherrie Toribio SAUNDERS, MD;  Location: [facility];  Service: Cardiovascular;  Laterality: N/A;   RIGHT/LEFT HEART CATH AND CORONARY ANGIOGRAPHY N/A 01/22/2019   Procedure: RIGHT/LEFT HEART CATH AND CORONARY ANGIOGRAPHY;  Surgeon: Cherrie Toribio SAUNDERS, MD;  Location: [facility];  Service: Cardiovascular;  Laterality: N/A;   WISDOM TOOTH EXTRACTION     NO ANESTHESIA   Social History:  reports that she has never smoked. She has never been exposed to tobacco smoke. She has never used smokeless tobacco. She reports that she does not drink alcohol and does not use drugs.  Allergies[1]  Family History  Problem Relation Age of Onset   Epilepsy Mother    Asthma Mother    Vision loss Mother    Heart disease Father    Breast cancer Maternal Aunt    Cancer Maternal Aunt 80       BREAST   Hyperlipidemia Maternal Aunt    Hearing loss Maternal Aunt    Hearing loss Maternal Uncle    Hypertension Maternal Grandmother    Hearing loss Maternal Grandmother    Kidney disease Maternal Grandmother    Cancer Maternal Grandfather    Hearing loss Maternal Grandfather    Breast cancer Paternal Grandmother    Cancer Paternal Grandmother        BREAST   Heart disease Paternal Grandfather     Prior to Admission medications  Medication Sig Start Date End Date Taking? Authorizing Provider  acetaminophen  (TYLENOL ) 325 MG tablet Take 650 mg by mouth every 6 (six) hours as needed for mild pain or headache.    [provider]  benzonatate  (TESSALON ) 200 MG capsule Take 1 capsule (200 mg total) by mouth 2 (two) times daily as  needed for cough. 04/04/24   Blair, Diane W, FNP  ENTRESTO  97-103 MG TAKE 1 TABLET BY MOUTH TWICE  DAILY 04/15/24   Cindie Ole DASEN, MD  FARXIGA  10 MG TABS tablet TAKE 1 TABLET BY MOUTH DAILY  BEFORE BREAKFAST 11/27/23   Cindie Ole DASEN, MD  fluticasone  (FLONASE ) 50 MCG/ACT nasal spray Place 2 sprays into both nostrils daily. 04/10/24   Vivienne Delon HERO, PA-C  furosemide  (LASIX ) 40 MG tablet Take 2 tablets (80 mg total) by mouth 2 (two) times daily. 10/22/23   Milford, Harlene HERO, FNP  gabapentin  (NEURONTIN ) 300 MG capsule Take 1 capsule (300 mg total) by mouth at bedtime. 04/15/24   Henson, Vickie L, NP-C  levonorgestrel  (MIRENA ) 20 MCG/24HR IUD 1 each by Intrauterine route once.    [provider]  meclizine  (ANTIVERT ) 25 MG tablet Take 1 tablet (25 mg total) by mouth 3 (three) times daily as needed for dizziness. 03/22/23   Bensimhon, Toribio SAUNDERS, MD  Multiple Vitamins-Minerals (WOMENS MULTIVITAMIN PO) Take 1 tablet by mouth daily.  [provider]  NON FORMULARY Pt uses a cpap nightly    [provider]  ondansetron  (ZOFRAN -ODT) 4 MG disintegrating tablet Take 1 tablet (4 mg total) by mouth every 8 (eight) hours as needed for nausea or vomiting. 02/11/24   Bensimhon, Toribio SAUNDERS, MD  pantoprazole  (PROTONIX ) 40 MG tablet Take 1 tablet (40 mg total) by mouth daily. 04/16/24   Lendia Boby CROME, NP-C  potassium chloride  SA (KLOR-CON  M) 20 MEQ tablet TAKE 2 TABLETS BY MOUTH DAILY 11/27/23   Cindie Ole DASEN, MD  spironolactone  (ALDACTONE ) 25 MG tablet Take 1 tablet (25 mg total) by mouth daily. 11/20/23   Glena Harlene HERO, FNP  torsemide  (DEMADEX ) 10 MG tablet Take 10 mg by mouth. 12/24/23   [provider]  ZEPBOUND  5 MG/0.5ML Pen Inject 5 mg into the skin once a week. 04/03/24   Geraldine Setter D, RPH-CPP    Physical Exam: Vitals:   05/02/24 1600 05/02/24 1615 05/02/24 1630 05/02/24 1700  BP: 101/83  95/82 110/79  Pulse: 87  82 87  Resp:  19 18 18   Temp:    98  F (36.7 C)  TempSrc:    Oral  SpO2: 99%  95% 100%   Bedside physical exam was performed by RN listed above. Below exam findings are based on their in person physical exam findings and my observations during virtual encounter.  General exam: awake, alert, no acute distress HEENT: Voice clear, hearing grossly normal  Respiratory system: Faint bibasilar rales otherwise lungs clear without wheezes or rhonchi, normal respiratory effort. Cardiovascular system: normal S1/S2, RRR, trace to 1+bilateral lower extremity edema Gastrointestinal system: Abdomen soft and nondistended with right upper quadrant tenderness on palpation no guarding or rebound noted, bowel sounds present Central nervous system: A&O x 4. no gross focal neurologic deficits, normal speech Skin: dry, intact, normal temperature  Psychiatry: normal mood, congruent affect, judgement and insight appear normal   Data Reviewed:  As reviewed in detail above  Assessment and Plan:  * Acute cholecystitis Patient presented with 2 weeks of upper abdominal pain and pressure, nausea/vomiting.  CT abdomen pelvis with contrast showed cholelithiasis with gallbladder wall thickening and Perry cholecystic edema consistent with acute cholecystitis.  No bile duct dilation was seen.  Subsequent right upper quadrant ultrasound showed findings consistent with acute cholecystitis with cholelithiasis, no bile duct dilation. EDP consulted general surgery, recommended admission, IV antibiotics, cardiology clearance for anticipated cholecystectomy. Labs stable without leukocytosis and overall normal LFTs -- Admit to med telemetry floor --Continue IV Zosyn  --N.p.o. after midnight --Will hold off IV fluids, patient mildly edematous with history of CHF --Monitor fever curve, CBC, CMP --Monitor abdominal exam closely --Pain control as needed --IV antiemetics as needed   Morbid obesity (HCC) There is no height or weight on file to calculate  BMI. Complicates overall care and prognosis.  Recommend lifestyle modifications including physical activity and diet for weight loss and overall long-term health. --On Zepbound  - confirm pending med hx  ICD (implantable cardioverter-defibrillator) in place Noted. Monitor on telemetry.  Nonischemic cardiomyopathy (HCC) Seems clinically compensated on admission.  Echo in April 2025 showed EF 20-25% with LV global hypokinesis, severely dilated left atrium, mildly dilated right atrium, mod-severe MR, mild-mod TR. Patient seems fairly well compensated although does have some mild lower extremity edema and basilar rales on exam --20 mg IV Lasix  x 1 this evening --Reassess for further diuresis --Repeat echo ordered --Cardiology consulted for preoperative clearance --Strict I/O's and Daily weights --Monitor renal function  and electrolytes  GERD (gastroesophageal reflux disease) Resume PPI      Advance Care Planning: CODE STATUS-full code  Consults:  General Surgery Dr. Teresa consulted by EDP Cardiology -  fellow notified  Family Communication: None present during virtual admission encounter  Severity of Illness: The appropriate patient status for this patient is INPATIENT. Inpatient status is judged to be reasonable and necessary in order to provide the required intensity of service to ensure the patient's safety. The patient's presenting symptoms, physical exam findings, and initial radiographic and laboratory data in the context of their chronic comorbidities is felt to place them at high risk for further clinical deterioration. Furthermore, it is not anticipated that the patient will be medically stable for discharge from the hospital within 2 midnights of admission.   * I certify that at the point of admission it is my clinical judgment that the patient will require inpatient hospital care spanning beyond 2 midnights from the point of admission due to high intensity of service, high  risk for further deterioration and high frequency of surveillance required.*  Author: Burnard DELENA Cunning, DO 05/02/2024 6:31 PM  For on call review [email or web address].      [1] No Known Allergies  "

## 2024-05-02 NOTE — ED Triage Notes (Signed)
 Pt presents with c/o SOB and upper abdominal pain. States the SOB is a chronic issue. Although she feels more SOB than normal especially on exertion. 97% O2 stat room air in triage room. Also mentions upper abdominal pain/tightness x 2 weeks. Last BM was today but it was abnormal per pt. Has been taking Miralax . In addition to her prescribed lasix  medication in case it was fluid related.

## 2024-05-02 NOTE — ED Notes (Signed)
 Patient is being discharged from the Urgent Care and sent to the Emergency Department via private vehicle . Per Rocky Mecum PA, patient is in need of higher level of care due to upper abdominal pain/tightness x 2 weeks. Patient is aware and verbalizes understanding of plan of care.   Vitals:   05/02/24 1218  BP: 102/70  Pulse: 90  Resp: 18  Temp: 97.8 F (36.6 C)  SpO2: 97%

## 2024-05-02 NOTE — ED Provider Notes (Signed)
 " [place] [facility] AT [facility] Provider Note   CSN: [id number] Arrival date & time: 05/02/24  1412     Patient presents with: Abdominal Pain and Shortness of Breath   Tara Bradshaw is a 39 y.o. female.   Patient with past medical history of AICD (automatic cardioverter/defibrillator) present, CHF 2/2 nonischemic myopathy, GERD (gastroesophageal reflux disease), Pneumonia, PVC's --presents to the emergency department for evaluation of upper abdominal pain and pressure with radiation to the back.  Symptoms have been ongoing for about 2 weeks.  Patient has had shortness of breath that is near baseline.  She has had nausea, early satiety but no persistent vomiting.  She has had constipation with only the passage of a few episodes of small hard stools.  No chest pain.  She has bilateral lower leg/ankle swelling which is at baseline.  Patient does not feel that her abdomen is distended.  No fevers or cough.  She is taking 40 mg furosemide  twice daily.  Patient seen at urgent care prior to arrival, sent for further evaluation.  Per patient, they were worried about a bowel obstruction.       Prior to Admission medications  Medication Sig Start Date End Date Taking? Authorizing Provider  acetaminophen  (TYLENOL ) 325 MG tablet Take 650 mg by mouth every 6 (six) hours as needed for mild pain or headache.    [provider]  benzonatate  (TESSALON ) 200 MG capsule Take 1 capsule (200 mg total) by mouth 2 (two) times daily as needed for cough. 04/04/24   Blair, Diane W, FNP  ENTRESTO  97-103 MG TAKE 1 TABLET BY MOUTH TWICE  DAILY 04/15/24   Cindie Ole DASEN, MD  FARXIGA  10 MG TABS tablet TAKE 1 TABLET BY MOUTH DAILY  BEFORE BREAKFAST 11/27/23   Cindie Ole DASEN, MD  fluticasone  (FLONASE ) 50 MCG/ACT nasal spray Place 2 sprays into both nostrils daily. 04/10/24   Vivienne Delon HERO, PA-C  furosemide  (LASIX ) 40 MG tablet Take 2 tablets (80 mg total) by mouth 2 (two) times  daily. 10/22/23   Milford, Harlene HERO, FNP  gabapentin  (NEURONTIN ) 300 MG capsule Take 1 capsule (300 mg total) by mouth at bedtime. 04/15/24   Henson, Vickie L, NP-C  levonorgestrel  (MIRENA ) 20 MCG/24HR IUD 1 each by Intrauterine route once.    [provider]  meclizine  (ANTIVERT ) 25 MG tablet Take 1 tablet (25 mg total) by mouth 3 (three) times daily as needed for dizziness. 03/22/23   Bensimhon, Toribio SAUNDERS, MD  Multiple Vitamins-Minerals (WOMENS MULTIVITAMIN PO) Take 1 tablet by mouth daily.    [provider]  NON FORMULARY Pt uses a cpap nightly    [provider]  ondansetron  (ZOFRAN -ODT) 4 MG disintegrating tablet Take 1 tablet (4 mg total) by mouth every 8 (eight) hours as needed for nausea or vomiting. 02/11/24   Bensimhon, Toribio SAUNDERS, MD  pantoprazole  (PROTONIX ) 40 MG tablet Take 1 tablet (40 mg total) by mouth daily. 04/16/24   Lendia Boby CROME, NP-C  potassium chloride  SA (KLOR-CON  M) 20 MEQ tablet TAKE 2 TABLETS BY MOUTH DAILY 11/27/23   Cindie Ole DASEN, MD  spironolactone  (ALDACTONE ) 25 MG tablet Take 1 tablet (25 mg total) by mouth daily. 11/20/23   Milford, Harlene HERO, FNP  torsemide  (DEMADEX ) 10 MG tablet Take 10 mg by mouth. 12/24/23   [provider]  ZEPBOUND  5 MG/0.5ML Pen Inject 5 mg into the skin once a week. 04/03/24   Maccia, Melissa D, RPH-CPP    Allergies:  Patient has no known allergies.    Review of Systems  Updated Vital Signs BP (!) 124/93   Pulse 88   Temp 97.6 F (36.4 C) (Oral)   Resp (!) 24   SpO2 100%   Physical Exam Vitals and nursing note reviewed.  Constitutional:      General: She is not in acute distress.    Appearance: She is well-developed.  HENT:     Head: Normocephalic and atraumatic.     Right Ear: External ear normal.     Left Ear: External ear normal.     Nose: Nose normal.  Eyes:     Conjunctiva/sclera: Conjunctivae normal.  Cardiovascular:     Rate and Rhythm: Normal rate and regular rhythm.     Heart  sounds: No murmur heard. Pulmonary:     Effort: No respiratory distress.     Breath sounds: Examination of the right-lower field reveals rales. Examination of the left-lower field reveals rales. Rales present. No wheezing or rhonchi.  Abdominal:     Palpations: Abdomen is soft.     Tenderness: There is abdominal tenderness in the right upper quadrant, epigastric area and left upper quadrant. There is no guarding or rebound. Negative signs include Murphy's sign and McBurney's sign.  Musculoskeletal:     Cervical back: Normal range of motion and neck supple.     Right lower leg: Edema (Trace ankles) present.     Left lower leg: Edema (Trace ankles) present.  Skin:    General: Skin is warm and dry.     Findings: No rash.  Neurological:     General: No focal deficit present.     Mental Status: She is alert. Mental status is at baseline.     Motor: No weakness.  Psychiatric:        Mood and Affect: Mood normal.     (all labs ordered are listed, but only abnormal results are displayed) Labs Reviewed  BASIC METABOLIC PANEL WITH GFR - Abnormal; Notable for the following components:      Result Value   Potassium 3.4 (*)    All other components within normal limits  CBC - Abnormal; Notable for the following components:   Hemoglobin 11.1 (*)    HCT 35.0 (*)    RDW 16.0 (*)    All other components within normal limits  HEPATIC FUNCTION PANEL - Abnormal; Notable for the following components:   ALT 47 (*)    Bilirubin, Direct 0.3 (*)    All other components within normal limits  PRO BRAIN NATRIURETIC PEPTIDE - Abnormal; Notable for the following components:   Pro Brain Natriuretic Peptide 3,347.0 (*)    All other components within normal limits  PREGNANCY, URINE  LIPASE, BLOOD  MAGNESIUM   TROPONIN T, HIGH SENSITIVITY    EKG: EKG Interpretation Date/Time:  Saturday May 02 2024 15:13:27 EST Ventricular Rate:  85 PR Interval:  180 QRS Duration:  115 QT Interval:  358 QTC  Calculation: 426 R Axis:   -5  Text Interpretation: Sinus rhythm Paired ventricular premature complexes Right atrial enlargement Incomplete left bundle branch block Borderline low voltage, extremity leads LVH with secondary repolarization abnormality Artifact in lead(s) II III V1 New nonspecific ST abnormality, repolarization abnormality lateral leads Confirmed by Dreama Longs ([id number]) on 05/02/2024 3:22:32 PM  Radiology: DG Chest 2 View Result Date: 05/02/2024 EXAM: 2 VIEW(S) XRAY OF THE CHEST 05/02/2024 02:47:21 PM COMPARISON: None available. CLINICAL HISTORY: Shortness of breath. FINDINGS: LINES, TUBES AND DEVICES: Neurostimulator device  battery pack overlies right chest. Left chest AICD in place. LUNGS AND PLEURA: Indistinct pulmonary vasculature favoring pulmonary venous hypertension. No overt airspace edema. No blunting of the costophrenic angles. No focal pulmonary opacity. No pleural effusion. No pneumothorax. Lateral view blurred by motion artifact. HEART AND MEDIASTINUM: Cardiomegaly. BONES AND SOFT TISSUES: No acute osseous abnormality. IMPRESSION: 1. Indistinct pulmonary vasculature favoring pulmonary venous hypertension. No overt airspace edema or blunting of the costophrenic angles. 2. Cardiomegaly. 3. Left chest AICD and right chest neurostimulator device battery pack. Electronically signed by: Ryan Salvage MD 05/02/2024 03:10 PM EST RP Workstation: [id number]     Procedures   Medications Ordered in the ED - No data to display  ED Course  Patient seen and examined. History obtained directly from patient.   Labs/EKG: CBC, BMP with hepatic function panel, lipase, BNP, troponin ordered.  I added on magnesium .  Imaging: Ordered chest x-ray.  Patient will need CT imaging, awaiting creatinine.  Medications/Fluids: None ordered, patient currently comfortable.  Most recent vital signs reviewed and are as follows: BP (!) 124/93   Pulse 88   Temp 97.6 F (36.4 C) (Oral)    Resp (!) 24   SpO2 100%   Initial impression: Upper abdominal tenderness, nausea, early satiety and constipation x 2 weeks.  Patient feels that from a heart failure standpoint, her symptoms are generally controlled.  She continues furosemide  at home.  5:27 PM Reassessment performed. Patient appears stable and comfortable.  Labs personally reviewed and interpreted including: CBC with normal white blood cell count, hemoglobin 11.1; BMP potassium 3.4 otherwise unremarkable; hepatic function panel, ALT is mildly elevated at 47 otherwise normal AST and alkaline phosphatase, total bili normal at 0.9; lipase normal; magnesium  normal; troponin is normal; pregnancy negative; BNP is elevated at 3347.  Imaging personally visualized and interpreted including: Chest x-ray agree no severe edema, radiology report states favors pulmonary venous hypertension; CT agree signs of acute cholecystitis and cholelithiasis; ultrasound RUQ agree with signs of acute cholecystitis with cholelithiasis.  Reviewed pertinent lab work and imaging with patient at bedside. Questions answered.   Most current vital signs reviewed and are as follows: BP 110/79   Pulse 87   Temp 97.6 F (36.4 C) (Oral)   Resp 18   SpO2 100%   Plan: Will initiate antibiotics, discussed with surgery.  Discussed with Dr. Medford Pizza: Recommends admission, [facility] or [facility] --Will leave location up to hospitalist.  Requests clearance by cardiology prior to surgery.  Request IV Zosyn .  They will plan to see on rounds tomorrow.  5:40 PM Spoke with [facility] Dr. Fausto, accepts.                                   Medical Decision Making Amount and/or Complexity of Data Reviewed Labs: ordered. Radiology: ordered.  Risk Prescription drug management. Decision regarding hospitalization.   Patient with upper abdominal symptoms, found to have acute cholecystitis.  Reassuring labs.  From heart failure standpoint, patient appears to be  minimally fluid overloaded at worst.  She will be admitted for optimization and surgical consultation.     Final diagnoses:  Acute cholecystitis    ED Discharge Orders     None          Desiderio Chew, [place] 05/02/24 1742  "

## 2024-05-02 NOTE — ED Provider Notes (Signed)
 " [facility]    CSN: [id number] Arrival date & time: 05/02/24  1206      History   Chief Complaint Chief Complaint  Patient presents with   Shortness of Breath   Abdominal Pain    HPI Zoua Caporaso is a 39 y.o. female.  has a past medical history of AICD (automatic cardioverter/defibrillator) present, Allergy, Anemia, CHF (congestive heart failure) (HCC), Chronic systolic dysfunction of left ventricle, GERD (gastroesophageal reflux disease), Nonischemic cardiomyopathy (HCC), Pneumonia, PVC's (premature ventricular contractions), and Sleep apnea.   HPI  Pt is here today with concerns for upper abdominal tightness and upper abdominal pain that has been ongoing for about 2 weeks. She states she has a previous hx of CHF and cardiomyopathy along with fluid collection and is concerned this may be related to her current symptoms. She reports limited ability to eat or drink due to feeling full very quickly but denies vomiting, she does endorse nausea. She reports limited ability to have a bowel movement (she reports small pellets and feels the need to have a bowel movement but is not able to go). She denies difficulty urinating. She states she has not been able to pass gas.  She denies fever or chills. She does report increased SOB and DOE. She reports limited episodes of productive cough with thick white phlegm.     Past Medical History:  Diagnosis Date   AICD (automatic cardioverter/defibrillator) present    Allergy    Anemia    CHRONIC   CHF (congestive heart failure) (HCC)    Chronic systolic dysfunction of left ventricle    GERD (gastroesophageal reflux disease)    WITH PREGNANCY   Nonischemic cardiomyopathy (HCC)    Pneumonia    x 1   PVC's (premature ventricular contractions)    Sleep apnea     Patient Active Problem List   Diagnosis Date Noted   Multifocal pneumonia 11/10/2022   Adjustment disorder with mixed anxiety and depressed mood 10/24/2019    Cardiomyopathy (HCC) 04/06/2019   ICD (implantable cardioverter-defibrillator) in place 04/06/2019   Subclinical hyperthyroidism 07/05/2018   Abnormal transaminases 07/05/2018   Acute congestive heart failure (HCC) 07/03/2018   Anemia, postpartum 12/13/2017   Normal postpartum course 12/13/2017   Indication for care in labor or delivery 12/11/2017   Hyperemesis affecting pregnancy, antepartum 07/25/2017   Dehydration during pregnancy 07/23/2017   Morbid obesity (HCC) 11/26/2016   GERD (gastroesophageal reflux disease) 11/26/2016    Past Surgical History:  Procedure Laterality Date   CARDIAC CATHETERIZATION     DILATION AND EVACUATION N/A 11/30/2016   Procedure: DILATATION AND EVACUATION;  Surgeon: Fredirick Glenys RAMAN, MD;  Location: [facility];  Service: Gynecology;  Laterality: N/A;   ICD IMPLANT N/A 02/19/2019   Procedure: ICD IMPLANT;  Surgeon: Kelsie Agent, MD;  Location: [facility];  Service: Cardiovascular;  Laterality: N/A;   LEAD REVISION/REPAIR N/A 04/16/2019   Procedure: LEAD REVISION/REPAIR;  Surgeon: Kelsie Agent, MD;  Location: [facility];  Service: Cardiovascular;  Laterality: N/A;   RIGHT HEART CATH N/A 04/24/2022   Procedure: RIGHT HEART CATH;  Surgeon: Cherrie Toribio SAUNDERS, MD;  Location: [facility];  Service: Cardiovascular;  Laterality: N/A;   RIGHT/LEFT HEART CATH AND CORONARY ANGIOGRAPHY N/A 01/22/2019   Procedure: RIGHT/LEFT HEART CATH AND CORONARY ANGIOGRAPHY;  Surgeon: Cherrie Toribio SAUNDERS, MD;  Location: [facility];  Service: Cardiovascular;  Laterality: N/A;   WISDOM TOOTH EXTRACTION     NO ANESTHESIA  OB History     Gravida  3   Para  2   Term  2   Preterm      AB  1   Living  2      SAB  1   IAB      Ectopic      Multiple  0   Live Births  2        Obstetric Comments  G2- D&C for missed AB          Home Medications    Prior to Admission medications  Medication Sig Start Date End Date Taking?  Authorizing Provider  acetaminophen  (TYLENOL ) 325 MG tablet Take 650 mg by mouth every 6 (six) hours as needed for mild pain or headache.    [provider]  benzonatate  (TESSALON ) 200 MG capsule Take 1 capsule (200 mg total) by mouth 2 (two) times daily as needed for cough. 04/04/24   Blair, Diane W, FNP  ENTRESTO  97-103 MG TAKE 1 TABLET BY MOUTH TWICE  DAILY 04/15/24   Cindie Ole DASEN, MD  FARXIGA  10 MG TABS tablet TAKE 1 TABLET BY MOUTH DAILY  BEFORE BREAKFAST 11/27/23   Cindie Ole DASEN, MD  fluticasone  (FLONASE ) 50 MCG/ACT nasal spray Place 2 sprays into both nostrils daily. 04/10/24   Vivienne Delon HERO, PA-C  furosemide  (LASIX ) 40 MG tablet Take 2 tablets (80 mg total) by mouth 2 (two) times daily. 10/22/23   Milford, Harlene HERO, FNP  gabapentin  (NEURONTIN ) 300 MG capsule Take 1 capsule (300 mg total) by mouth at bedtime. 04/15/24   Henson, Vickie L, NP-C  levonorgestrel  (MIRENA ) 20 MCG/24HR IUD 1 each by Intrauterine route once.    [provider]  meclizine  (ANTIVERT ) 25 MG tablet Take 1 tablet (25 mg total) by mouth 3 (three) times daily as needed for dizziness. 03/22/23   Bensimhon, Toribio SAUNDERS, MD  Multiple Vitamins-Minerals (WOMENS MULTIVITAMIN PO) Take 1 tablet by mouth daily.    [provider]  NON FORMULARY Pt uses a cpap nightly    [provider]  ondansetron  (ZOFRAN -ODT) 4 MG disintegrating tablet Take 1 tablet (4 mg total) by mouth every 8 (eight) hours as needed for nausea or vomiting. 02/11/24   Bensimhon, Toribio SAUNDERS, MD  pantoprazole  (PROTONIX ) 40 MG tablet Take 1 tablet (40 mg total) by mouth daily. 04/16/24   Lendia Boby CROME, NP-C  potassium chloride  SA (KLOR-CON  M) 20 MEQ tablet TAKE 2 TABLETS BY MOUTH DAILY 11/27/23   Cindie Ole DASEN, MD  spironolactone  (ALDACTONE ) 25 MG tablet Take 1 tablet (25 mg total) by mouth daily. 11/20/23   Milford, Harlene HERO, FNP  torsemide  (DEMADEX ) 10 MG tablet Take 10 mg by mouth. 12/24/23   [provider]   ZEPBOUND  5 MG/0.5ML Pen Inject 5 mg into the skin once a week. 04/03/24   Geraldine Eleanor BIRCH, RPH-CPP    Family History Family History  Problem Relation Age of Onset   Epilepsy Mother    Asthma Mother    Vision loss Mother    Heart disease Father    Breast cancer Maternal Aunt    Cancer Maternal Aunt 46       BREAST   Hyperlipidemia Maternal Aunt    Hearing loss Maternal Aunt    Hearing loss Maternal Uncle    Hypertension Maternal Grandmother    Hearing loss Maternal Grandmother    Kidney disease Maternal Grandmother    Cancer Maternal Grandfather    Hearing loss Maternal Grandfather  Breast cancer Paternal Grandmother    Cancer Paternal Grandmother        BREAST   Heart disease Paternal Grandfather     Social History Social History[1]   Allergies   Patient has no known allergies.   Review of Systems Review of Systems  Constitutional:  Negative for chills and fever.  Respiratory:  Positive for cough and shortness of breath.   Cardiovascular:  Positive for leg swelling (mild around the ankles). Negative for chest pain and palpitations.  Gastrointestinal:  Positive for abdominal pain, constipation, nausea and vomiting.  Genitourinary:  Negative for difficulty urinating.  Neurological:  Negative for dizziness and light-headedness.     Physical Exam Triage Vital Signs ED Triage Vitals  Encounter Vitals Group     BP 05/02/24 1218 102/70     Girls Systolic BP Percentile --      Girls Diastolic BP Percentile --      Boys Systolic BP Percentile --      Boys Diastolic BP Percentile --      Pulse Rate 05/02/24 1218 90     Resp 05/02/24 1218 18     Temp 05/02/24 1218 97.8 F (36.6 C)     Temp Source 05/02/24 1218 Oral     SpO2 05/02/24 1218 97 %     Weight 05/02/24 1234 242 lb 9.6 oz (110 kg)     Height --      Head Circumference --      Peak Flow --      Pain Score 05/02/24 1221 6     Pain Loc --      Pain Education --      Exclude from Growth Chart --     No data found.  Updated Vital Signs BP 102/70 (BP Location: Right Arm)   Pulse 90   Temp 97.8 F (36.6 C) (Oral)   Resp 18   Wt 242 lb 9.6 oz (110 kg)   SpO2 97%   BMI 44.37 kg/m   Visual Acuity Right Eye Distance:   Left Eye Distance:   Bilateral Distance:    Right Eye Near:   Left Eye Near:    Bilateral Near:     Physical Exam Vitals reviewed.  Constitutional:      General: She is awake.     Appearance: Normal appearance. She is well-developed and well-groomed.  HENT:     Head: Normocephalic and atraumatic.  Eyes:     General: Lids are normal. Gaze aligned appropriately.     Extraocular Movements: Extraocular movements intact.     Conjunctiva/sclera: Conjunctivae normal.  Cardiovascular:     Rate and Rhythm: Normal rate and regular rhythm.     Pulses: Normal pulses.          Radial pulses are 2+ on the right side and 2+ on the left side.     Heart sounds: Heart sounds are distant. No murmur heard.    No friction rub. No gallop.  Pulmonary:     Effort: Pulmonary effort is normal.     Breath sounds: Normal breath sounds. No decreased air movement. No decreased breath sounds, wheezing, rhonchi or rales.  Abdominal:     General: Abdomen is protuberant. Bowel sounds are absent. There is no distension.     Palpations: Abdomen is soft. There is mass (Palpable mass extending along the left side of the abdomen and across the epigastric area). There is no shifting dullness or fluid wave.     Tenderness: There  is abdominal tenderness in the right upper quadrant, epigastric area and left upper quadrant. There is no guarding or rebound. Negative signs include Murphy's sign, Rovsing's sign and McBurney's sign.  Musculoskeletal:     Right lower leg: 1+ Edema present.     Left lower leg: 1+ Edema present.  Neurological:     Mental Status: She is alert and oriented to person, place, and time.  Psychiatric:        Attention and Perception: Attention and perception normal.         Mood and Affect: Mood and affect normal.        Speech: Speech normal.        Behavior: Behavior normal. Behavior is cooperative.      UC Treatments / Results  Labs (all labs ordered are listed, but only abnormal results are displayed) Labs Reviewed - No data to display  EKG   Radiology No results found.  Procedures Procedures (including critical care time)  Medications Ordered in UC Medications - No data to display  Initial Impression / Assessment and Plan / UC Course  I have reviewed the triage vital signs and the nursing notes.  Pertinent labs & imaging results that were available during my care of the patient were reviewed by me and considered in my medical decision making (see chart for details).      Final Clinical Impressions(s) / UC Diagnoses   Final diagnoses:  Abdominal tightness  Unable to pass flatus  Constipation, unspecified constipation type  Nausea and vomiting, unspecified vomiting type   Patient presents today with concerns of abdominal tightness and discomfort this been ongoing for about 2 weeks.  In addition to the tightness she reports that she has had nausea and vomiting along with decreased ability to tolerate p.o. intake.  She states that she sometimes has some vomiting and productive coughing of white phlegm.  Physical exam reveals vesicular lung sounds without evidence of wheezing, rales, rhonchi.  Heart sounds are slightly distant but appears to have regular rate and rhythm.  Abdomen is notable for decreased to absent bowel sounds as well as a mass along the descending colon which I suspect is likely stool.  She does have tenderness along the epigastric and upper quadrants without evidence of fluid wave.  Based on patient's symptoms of constipation, inability to pass flatus, nausea and intermittent vomiting I am concerned for potential small bowel obstruction but cannot rule out ascites given her previous history of CHF and cardiomyopathy.   Admittedly constipation or fecal impaction may also be a contributing factor as well. Since I am concerned for potential small bowel obstruction or ascites I do recommend that she goes to the ER for further evaluation with appropriate testing and imaging.  Patient is amenable to this and states she will go via private vehicle.  Recommend close follow-up with PCP after ER visit to ensure appropriate resolution.   Discharge Instructions   None    ED Prescriptions   None    PDMP not reviewed this encounter.     [1]  Social History Tobacco Use   Smoking status: Never    Passive exposure: Never   Smokeless tobacco: Never  Vaping Use   Vaping status: Never Used  Substance Use Topics   Alcohol use: No   Drug use: No     Seichi Kaufhold, [place] BRAVO, PA-C 05/02/24 1357  "

## 2024-05-02 NOTE — ED Notes (Signed)
 3 beat run VT  Asymptomatic Provider notified

## 2024-05-02 NOTE — Assessment & Plan Note (Signed)
 Noted  Monitor on telemetry

## 2024-05-02 NOTE — Assessment & Plan Note (Signed)
 There is no height or weight on file to calculate BMI. Complicates overall care and prognosis.  Recommend lifestyle modifications including physical activity and diet for weight loss and overall long-term health. --On Zepbound  - confirm pending med hx

## 2024-05-02 NOTE — Assessment & Plan Note (Addendum)
 Seems clinically compensated on admission.  Echo in April 2025 showed EF 20-25% with LV global hypokinesis, severely dilated left atrium, mildly dilated right atrium, mod-severe MR, mild-mod TR. Patient seems fairly well compensated although does have some mild lower extremity edema and basilar rales on exam --20 mg IV Lasix  x 1 this evening --Reassess for further diuresis --Repeat echo ordered --Cardiology consulted for preoperative clearance --Strict I/O's and Daily weights --Monitor renal function and electrolytes

## 2024-05-02 NOTE — Assessment & Plan Note (Signed)
 Resume PPI

## 2024-05-03 ENCOUNTER — Inpatient Hospital Stay (HOSPITAL_COMMUNITY)

## 2024-05-03 DIAGNOSIS — I428 Other cardiomyopathies: Secondary | ICD-10-CM

## 2024-05-03 DIAGNOSIS — I5043 Acute on chronic combined systolic (congestive) and diastolic (congestive) heart failure: Secondary | ICD-10-CM | POA: Diagnosis not present

## 2024-05-03 DIAGNOSIS — K810 Acute cholecystitis: Secondary | ICD-10-CM

## 2024-05-03 DIAGNOSIS — Z01810 Encounter for preprocedural cardiovascular examination: Secondary | ICD-10-CM | POA: Diagnosis not present

## 2024-05-03 LAB — PROTIME-INR
INR: 1.2 (ref 0.8–1.2)
Prothrombin Time: 15.7 s — ABNORMAL HIGH (ref 11.4–15.2)

## 2024-05-03 LAB — COMPREHENSIVE METABOLIC PANEL WITH GFR
ALT: 43 U/L (ref 0–44)
AST: 29 U/L (ref 15–41)
Albumin: 3.7 g/dL (ref 3.5–5.0)
Alkaline Phosphatase: 45 U/L (ref 38–126)
Anion gap: 10 (ref 5–15)
BUN: 9 mg/dL (ref 6–20)
CO2: 27 mmol/L (ref 22–32)
Calcium: 8.7 mg/dL — ABNORMAL LOW (ref 8.9–10.3)
Chloride: 105 mmol/L (ref 98–111)
Creatinine, Ser: 0.8 mg/dL (ref 0.44–1.00)
GFR, Estimated: >60 mL/min
Glucose, Bld: 110 mg/dL — ABNORMAL HIGH (ref 70–99)
Potassium: 3.2 mmol/L — ABNORMAL LOW (ref 3.5–5.1)
Sodium: 142 mmol/L (ref 135–145)
Total Bilirubin: 0.7 mg/dL (ref 0.0–1.2)
Total Protein: 5.7 g/dL — ABNORMAL LOW (ref 6.5–8.1)

## 2024-05-03 LAB — ECHOCARDIOGRAM COMPLETE
Area-P 1/2: 7.16 cm2
Calc EF: 26.1 %
Est EF: <20
Height: 62 in
Radius: 0.6 cm
S' Lateral: 6 cm
Single Plane A2C EF: 25.4 %
Single Plane A4C EF: 26.8 %
Weight: 3811.2 [oz_av]

## 2024-05-03 LAB — CBC
HCT: 30.9 % — ABNORMAL LOW (ref 36.0–46.0)
Hemoglobin: 10.1 g/dL — ABNORMAL LOW (ref 12.0–15.0)
MCH: 27.5 pg (ref 26.0–34.0)
MCHC: 32.7 g/dL (ref 30.0–36.0)
MCV: 84.2 fL (ref 80.0–100.0)
Platelets: 165 10*3/uL (ref 150–400)
RBC: 3.67 MIL/uL — ABNORMAL LOW (ref 3.87–5.11)
RDW: 15.9 % — ABNORMAL HIGH (ref 11.5–15.5)
WBC: 5.7 10*3/uL (ref 4.0–10.5)
nRBC: 0 % (ref 0.0–0.2)

## 2024-05-03 LAB — HIV ANTIBODY (ROUTINE TESTING W REFLEX): HIV Screen 4th Generation wRfx: NONREACTIVE

## 2024-05-03 MED ORDER — POTASSIUM CHLORIDE CRYS ER 20 MEQ PO TBCR
40.0000 meq | EXTENDED_RELEASE_TABLET | Freq: Once | ORAL | Status: AC
  Administered 2024-05-03: 40 meq via ORAL
  Filled 2024-05-03: qty 2

## 2024-05-03 MED ORDER — POTASSIUM CHLORIDE 10 MEQ/100ML IV SOLN: 10.0000 meq | INTRAVENOUS | Status: DC

## 2024-05-03 NOTE — Consult Note (Signed)
 Reason for Consult:  Acute cholecystitis Referring Physician: Vann  Tara Bradshaw is an 39 y.o. female.  HPI: This is a 39 year old female with a past medical history of congestive heart failure, nonischemic cardiomyopathy status post AICD placement, gastroesophageal reflux disease, obesity who presents with several weeks of epigastric and right upper quadrant abdominal pain and pressure.  She started Zepbound  a few months ago and then developed the symptoms.  The pain gets worse when she is eating.  She describes the pain mostly as a pressure.  She reports nausea but no vomiting.  She reports constipation with no diarrhea.  The pain felt worse yesterday so she presented to the emergency department for evaluation.  CT scan showed cholelithiasis and gallbladder wall thickening and pericholecystic edema.  Ultrasound confirmed this.  Liver function test are normal.  White count is normal.  She was admitted to the hospital and has been evaluated by cardiology.  They have cleared her for surgical procedure.  She has been off of her Zepbound  for over a week.  There have not been any improvements in her symptoms.  No previous intra-abdominal surgery Past Medical History:  Diagnosis Date   AICD (automatic cardioverter/defibrillator) present    Allergy    Anemia    CHRONIC   CHF (congestive heart failure) (HCC)    Chronic systolic dysfunction of left ventricle    GERD (gastroesophageal reflux disease)    WITH PREGNANCY   Nonischemic cardiomyopathy (HCC)    Pneumonia    x 1   PVC's (premature ventricular contractions)    Sleep apnea     Past Surgical History:  Procedure Laterality Date   CARDIAC CATHETERIZATION     DILATION AND EVACUATION N/A 11/30/2016   Procedure: DILATATION AND EVACUATION;  Surgeon: Fredirick Glenys RAMAN, MD;  Location: [facility];  Service: Gynecology;  Laterality: N/A;   ICD IMPLANT N/A 02/19/2019   Procedure: ICD IMPLANT;  Surgeon: Kelsie Agent, MD;  Location: [facility];   Service: Cardiovascular;  Laterality: N/A;   LEAD REVISION/REPAIR N/A 04/16/2019   Procedure: LEAD REVISION/REPAIR;  Surgeon: Kelsie Agent, MD;  Location: [facility];  Service: Cardiovascular;  Laterality: N/A;   RIGHT HEART CATH N/A 04/24/2022   Procedure: RIGHT HEART CATH;  Surgeon: Cherrie Toribio SAUNDERS, MD;  Location: [facility];  Service: Cardiovascular;  Laterality: N/A;   RIGHT/LEFT HEART CATH AND CORONARY ANGIOGRAPHY N/A 01/22/2019   Procedure: RIGHT/LEFT HEART CATH AND CORONARY ANGIOGRAPHY;  Surgeon: Cherrie Toribio SAUNDERS, MD;  Location: [facility];  Service: Cardiovascular;  Laterality: N/A;   WISDOM TOOTH EXTRACTION     NO ANESTHESIA    Family History  Problem Relation Age of Onset   Epilepsy Mother    Asthma Mother    Vision loss Mother    Heart disease Father    Breast cancer Maternal Aunt    Cancer Maternal Aunt 52       BREAST   Hyperlipidemia Maternal Aunt    Hearing loss Maternal Aunt    Hearing loss Maternal Uncle    Hypertension Maternal Grandmother    Hearing loss Maternal Grandmother    Kidney disease Maternal Grandmother    Cancer Maternal Grandfather    Hearing loss Maternal Grandfather    Breast cancer Paternal Grandmother    Cancer Paternal Grandmother        BREAST   Heart disease Paternal Grandfather     Social History:  reports that she has never smoked. She has never  been exposed to tobacco smoke. She has never used smokeless tobacco. She reports that she does not drink alcohol and does not use drugs.  Allergies: Allergies[1]  Medications:  Prior to Admission medications  Medication Sig Start Date End Date Taking? Authorizing Provider  acetaminophen  (TYLENOL ) 325 MG tablet Take 650 mg by mouth every 6 (six) hours as needed for mild pain or headache.    [provider]  benzonatate  (TESSALON ) 200 MG capsule Take 1 capsule (200 mg total) by mouth 2 (two) times daily as needed for cough. 04/04/24   Blair, Diane W, FNP   ENTRESTO  97-103 MG TAKE 1 TABLET BY MOUTH TWICE  DAILY 04/15/24   Cindie Ole DASEN, MD  FARXIGA  10 MG TABS tablet TAKE 1 TABLET BY MOUTH DAILY  BEFORE BREAKFAST 11/27/23   Cindie Ole DASEN, MD  fluticasone  (FLONASE ) 50 MCG/ACT nasal spray Place 2 sprays into both nostrils daily. 04/10/24   Vivienne Delon HERO, PA-C  furosemide  (LASIX ) 40 MG tablet Take 2 tablets (80 mg total) by mouth 2 (two) times daily. 10/22/23   Milford, Harlene HERO, FNP  gabapentin  (NEURONTIN ) 300 MG capsule Take 1 capsule (300 mg total) by mouth at bedtime. 04/15/24   Henson, Vickie L, NP-C  levonorgestrel  (MIRENA ) 20 MCG/24HR IUD 1 each by Intrauterine route once.    [provider]  meclizine  (ANTIVERT ) 25 MG tablet Take 1 tablet (25 mg total) by mouth 3 (three) times daily as needed for dizziness. 03/22/23   Bensimhon, Toribio SAUNDERS, MD  Multiple Vitamins-Minerals (WOMENS MULTIVITAMIN PO) Take 1 tablet by mouth daily.    [provider]  NON FORMULARY Pt uses a cpap nightly    [provider]  ondansetron  (ZOFRAN -ODT) 4 MG disintegrating tablet Take 1 tablet (4 mg total) by mouth every 8 (eight) hours as needed for nausea or vomiting. 02/11/24   Bensimhon, Toribio SAUNDERS, MD  pantoprazole  (PROTONIX ) 40 MG tablet Take 1 tablet (40 mg total) by mouth daily. 04/16/24   Lendia Boby CROME, NP-C  potassium chloride  SA (KLOR-CON  M) 20 MEQ tablet TAKE 2 TABLETS BY MOUTH DAILY 11/27/23   Cindie Ole DASEN, MD  spironolactone  (ALDACTONE ) 25 MG tablet Take 1 tablet (25 mg total) by mouth daily. 11/20/23   Milford, Harlene HERO, FNP  ZEPBOUND  5 MG/0.5ML Pen Inject 5 mg into the skin once a week. 04/03/24   Maccia, Melissa D, RPH-CPP     Results for orders placed or performed during the hospital encounter of 05/02/24 (from the past 48 hours)  Basic metabolic panel     Status: Abnormal   Collection Time: 05/02/24  2:54 PM  Result Value Ref Range   Sodium 141 135 - 145 mmol/L   Potassium 3.4 (L) 3.5 - 5.1 mmol/L   Chloride 103  98 - 111 mmol/L   CO2 26 22 - 32 mmol/L   Glucose, Bld 92 70 - 99 mg/dL    Comment: Glucose reference range applies only to samples taken after fasting for at least 8 hours.   BUN 10 6 - 20 mg/dL   Creatinine, Ser 9.25 0.44 - 1.00 mg/dL   Calcium  9.7 8.9 - 10.3 mg/dL   GFR, Estimated >39 >39 mL/min    Comment: (NOTE) Calculated using the CKD-EPI Creatinine Equation (2021)    Anion gap 12 5 - 15    Comment: Performed at [facility], [address], [place], [place] [address]  CBC     Status: Abnormal   Collection Time: 05/02/24  2:54  PM  Result Value Ref Range   WBC 6.5 4.0 - 10.5 K/uL    Comment: REPEATED TO VERIFY   RBC 4.08 3.87 - 5.11 MIL/uL   Hemoglobin 11.1 (L) 12.0 - 15.0 g/dL   HCT 64.9 (L) 63.9 - 53.9 %   MCV 85.8 80.0 - 100.0 fL   MCH 27.2 26.0 - 34.0 pg   MCHC 31.7 30.0 - 36.0 g/dL   RDW 83.9 (H) 88.4 - 84.4 %   Platelets 165 150 - 400 K/uL    Comment: SPECIMEN CHECKED FOR CLOTS REPEATED TO VERIFY    nRBC 0.0 0.0 - 0.2 %    Comment: Performed at [facility], [address] [place], [place] [address]  Hepatic function panel     Status: Abnormal   Collection Time: 05/02/24  2:54 PM  Result Value Ref Range   Total Protein 6.7 6.5 - 8.1 g/dL   Albumin 4.3 3.5 - 5.0 g/dL   AST 35 15 - 41 U/L   ALT 47 (H) 0 - 44 U/L   Alkaline Phosphatase 53 38 - 126 U/L   Total Bilirubin 0.9 0.0 - 1.2 mg/dL   Bilirubin, Direct 0.3 (H) 0.0 - 0.2 mg/dL   Indirect Bilirubin 0.5 0.3 - 0.9 mg/dL    Comment: Performed at [facility], [address] [place], [place] [address]  Lipase, blood     Status: None   Collection Time: 05/02/24  2:54 PM  Result Value Ref Range   Lipase 17 11 - 51 U/L    Comment: Performed at [facility], [address], [place], [place] [address]  Pro Brain natriuretic peptide     Status: Abnormal   Collection Time: 05/02/24  2:54 PM  Result Value Ref Range   Pro Brain  Natriuretic Peptide 3,347.0 (H) <300.0 pg/mL    Comment: (NOTE) Age Group        Cut-Points    Interpretation  < 50 years     450 pg/mL       NT-proBNP > 450 pg/mL indicates                                ADHF is likely              50 to 75 years  900 pg/mL      NT-proBNP > 900 pg/mL indicates          ADHF is likely  > 75 years      1800 pg/mL     NT-proBNP > 1800 pg/mL indicates          ADHF is likely                           All ages    Results between       Indeterminate. Further clinical             300 and the cut-   information is needed to determine            point for age group   if ADHF is present.  Elecsys proBNP II/ Elecsys proBNP II STAT           Cut-Point                       Interpretation  300 pg/mL                    NT-proBNP <300pg/mL indicates                             ADHF is not likely  Performed at [facility], [address], [place], [place] [address]   Troponin T, High Sensitivity     Status: None   Collection Time: 05/02/24  2:54 PM  Result Value Ref Range   Troponin T High Sensitivity 6 0 - 19 ng/L    Comment: (NOTE) Biotin concentrations > 1000 ng/mL falsely decrease TnT results.  Serial cardiac troponin measurements are suggested.  Refer to the Links section for chest pain algorithms and additional  guidance. Performed at [facility], [address] [place], [place] [address]   Pregnancy, urine     Status: None   Collection Time: 05/02/24  3:21 PM  Result Value Ref Range   Preg Test, Ur NEGATIVE NEGATIVE    Comment:        THE SENSITIVITY OF THIS METHODOLOGY IS >20 mIU/mL. Performed at [facility], [address] [place], [place] [address]   Magnesium      Status: None   Collection Time: 05/02/24  3:21 PM  Result Value Ref Range   Magnesium  1.7 1.7 - 2.4 mg/dL    Comment: Performed at [facility], [address], [place], [place] [address]  Comprehensive metabolic panel     Status: Abnormal   Collection Time: 05/03/24  2:37 AM  Result Value Ref Range   Sodium 142 135 - 145 mmol/L   Potassium 3.2 (L) 3.5 - 5.1 mmol/L   Chloride 105 98 - 111 mmol/L   CO2 27 22 - 32 mmol/L   Glucose, Bld 110 (H) 70 - 99 mg/dL    Comment: Glucose reference range applies only to samples taken after fasting for at least 8 hours.   BUN 9 6 - 20 mg/dL   Creatinine, Ser 9.19 0.44 - 1.00 mg/dL   Calcium  8.7 (L) 8.9 - 10.3 mg/dL   Total Protein 5.7 (L) 6.5 - 8.1 g/dL   Albumin 3.7 3.5 - 5.0 g/dL   AST 29 15 - 41 U/L   ALT 43 0 - 44 U/L   Alkaline Phosphatase 45 38 - 126 U/L   Total Bilirubin 0.7 0.0 - 1.2 mg/dL   GFR, Estimated >39 >39 mL/min    Comment: (NOTE) Calculated using the CKD-EPI Creatinine Equation (2021)    Anion gap 10 5 - 15    Comment: Performed at [facility], [address] [place], [place] [address]  CBC     Status: Abnormal   Collection Time: 05/03/24  2:37 AM  Result Value Ref Range   WBC 5.7 4.0 - 10.5 K/uL   RBC 3.67 (L) 3.87 - 5.11 MIL/uL   Hemoglobin 10.1 (L) 12.0 - 15.0 g/dL   HCT 69.0 (L) 63.9 - 53.9 %   MCV 84.2 80.0 - 100.0 fL   MCH 27.5 26.0 - 34.0 pg   MCHC 32.7 30.0 - 36.0 g/dL   RDW 84.0 (H) 88.4 - 84.4 %  Platelets 165 150 - 400 K/uL   nRBC 0.0 0.0 - 0.2 %    Comment: Performed at [facility], [address] [place], [place] [address]  Protime-INR     Status: Abnormal   Collection Time: 05/03/24  2:37 AM  Result Value Ref Range   Prothrombin Time 15.7 (H) 11.4 - 15.2 seconds   INR 1.2 0.8 - 1.2    Comment: (NOTE) INR goal varies based on device and disease states. Performed at [facility], [address] [place], [place] [address]   HIV Antibody (routine testing w rflx)     Status: None   Collection Time: 05/03/24  2:37 AM  Result Value Ref Range   HIV Screen 4th Generation wRfx Non Reactive Non Reactive     Comment: Performed at [facility], [address], [place], [place] [address]    US  Abdomen Limited RUQ (LIVER/GB) Result Date: 05/02/2024 EXAM: Right Upper Quadrant Abdominal Ultrasound 05/02/2024 05:01:00 PM TECHNIQUE: Real-time ultrasonography of the right upper quadrant of the abdomen was performed. COMPARISON: CT abdomen and pelvis 05/02/2024. CLINICAL HISTORY: Upper abdominal pain. FINDINGS: LIVER: Normal echogenicity. Circumscribed focal hyperechoic liver lesions are demonstrated with the largest in the left lobe measuring 2 cm in maximal diameter, and the right lobe measuring 1.1 cm. The appearance is consistent with cavernous hemangiomas. No intrahepatic biliary ductal dilatation. Hepatopetal flow in the portal vein. BILIARY SYSTEM: Cholelithiasis with multiple stones in the gallbladder. Gallbladder wall thickening to 0.7 cm. Pericholecystic edema. Murphy's sign is positive. Changes are consistent with acute cholecystitis in the appropriate clinical setting. The Common Bile Duct measures 0.3 cm. No bile duct dilatation. RIGHT KIDNEY: No hydronephrosis. No echogenic calculi. No mass. PANCREAS: Visualized portions of the pancreas are unremarkable. OTHER: No right upper quadrant ascites. IMPRESSION: 1. Acute cholecystitis with cholelithiasis. 2. No bile duct dilatation. Electronically signed by: Elsie Gravely MD 05/02/2024 05:09 PM EST RP Workstation: [id number]   CT ABDOMEN PELVIS W CONTRAST Result Date: 05/02/2024 EXAM: CT ABDOMEN AND PELVIS WITH CONTRAST 05/02/2024 03:51:04 PM TECHNIQUE: CT of the abdomen and pelvis was performed with the administration of 100 mL of iohexol  (OMNIPAQUE ) 300 MG/ML solution. Multiplanar reformatted images are provided for review. Automated exposure control, iterative reconstruction, and/or weight-based adjustment of the mA/kV was utilized to reduce the radiation dose to as low as reasonably achievable. COMPARISON: Comparison with 07/18/2020. CLINICAL HISTORY:  Upper abdominal pain, nausea, constipation, congestive heart failure. FINDINGS: LOWER CHEST: Trace bilateral pleural effusions. Patchy ground-glass infiltrates in the lung bases may represent edema or pneumonia. Cardiac enlargement. LIVER: Mild diffuse fatty infiltration of the liver. No focal liver lesions. GALLBLADDER AND BILE DUCTS: Cholelithiasis with gallbladder wall thickening and pericholecystic edema. Changes likely to indicate acute cholecystitis in the appropriate clinical setting. No bile duct dilatation. SPLEEN: The spleen is normal. PANCREAS: Pancreas is unremarkable. ADRENAL GLANDS: Adrenal glands are unremarkable. KIDNEYS, URETERS AND BLADDER: Kidneys, ureters, and the bladder are unremarkable. No stones in the kidneys or ureters. No hydronephrosis. No perinephric or periureteral stranding. Urinary bladder is unremarkable. GI AND BOWEL: Stomach, small bowel, and colon are not abnormally distended. No wall thickening or inflammatory stranding identified. Appendix is normal. There is no bowel obstruction. PERITONEUM AND RETROPERITONEUM: A small amount of free fluid in the pelvis could be reactive or physiologic. Small amount of free fluid in the right paracolic gutter is likely reactive. No free air. VASCULATURE: Normal caliber abdominal aorta. No dissection. LYMPH NODES: No significant lymphadenopathy. REPRODUCTIVE ORGANS:  The uterus and ovaries are not significantly enlarged. Nodular enhancement in the uterus consistent with fibroid. An intrauterine device is present with central positioning. BONES AND SOFT TISSUES: No acute bony abnormalities. No focal soft tissue abnormality. IMPRESSION: 1. Cholelithiasis with gallbladder wall thickening and pericholecystic edema, consistent with acute cholecystitis. No bile duct dilatation. 2. Trace bilateral pleural effusions with patchy ground-glass opacities in the lung bases, which may reflect edema or pneumonia. 3. Cardiomegaly. Electronically signed by:  Elsie Gravely MD 05/02/2024 03:59 PM EST RP Workstation: [id number]   DG Chest 2 View Result Date: 05/02/2024 EXAM: 2 VIEW(S) XRAY OF THE CHEST 05/02/2024 02:47:21 PM COMPARISON: None available. CLINICAL HISTORY: Shortness of breath. FINDINGS: LINES, TUBES AND DEVICES: Neurostimulator device battery pack overlies right chest. Left chest AICD in place. LUNGS AND PLEURA: Indistinct pulmonary vasculature favoring pulmonary venous hypertension. No overt airspace edema. No blunting of the costophrenic angles. No focal pulmonary opacity. No pleural effusion. No pneumothorax. Lateral view blurred by motion artifact. HEART AND MEDIASTINUM: Cardiomegaly. BONES AND SOFT TISSUES: No acute osseous abnormality. IMPRESSION: 1. Indistinct pulmonary vasculature favoring pulmonary venous hypertension. No overt airspace edema or blunting of the costophrenic angles. 2. Cardiomegaly. 3. Left chest AICD and right chest neurostimulator device battery pack. Electronically signed by: Ryan Salvage MD 05/02/2024 03:10 PM EST RP Workstation: [id number]    Review of Systems  HENT:  Negative for ear discharge, ear pain, hearing loss and tinnitus.   Eyes:  Negative for photophobia and pain.  Respiratory:  Negative for cough and shortness of breath.   Cardiovascular:  Negative for chest pain.  Gastrointestinal:  Positive for abdominal distention, abdominal pain, constipation and nausea. Negative for vomiting.  Genitourinary:  Negative for dysuria, flank pain, frequency and urgency.  Musculoskeletal:  Negative for back pain, myalgias and neck pain.  Neurological:  Negative for dizziness and headaches.  Hematological:  Does not bruise/bleed easily.  Psychiatric/Behavioral:  The patient is not nervous/anxious.    Blood pressure 106/82, pulse 75, temperature 97.6 F (36.4 C), temperature source Oral, resp. rate 18, height 5' 2 (1.575 m), weight 108 kg, SpO2 98%. Physical Exam Constitutional:  WDWN in NAD, conversant, no  obvious deformities; lying in bed comfortably Eyes:  Pupils equal, round; sclera anicteric; moist conjunctiva; no lid lag HENT:  Oral mucosa moist; good dentition  Neck:  No masses palpated, trachea midline; no thyromegaly Lungs:  CTA bilaterally; normal respiratory effort CV:  Regular rate and rhythm; no murmurs; extremities well-perfused with no edema Abd:  +bowel sounds, soft, moderately tender in the epigastrium and right upper quadrant on-tender, no palpable organomegaly; no palpable hernias Musc:  Unable to assess gait; no apparent clubbing or cyanosis in extremities Lymphatic:  No palpable cervical or axillary lymphadenopathy Skin:  Warm, dry; no sign of jaundice Psychiatric - alert and oriented x 4; calm mood and affect  Assessment/Plan: Acute calculous cholecystitis History of congestive heart failure secondary to nonischemic cardiomyopathy AICD  Recommendations: The patient is on antibiotics already.  Clear liquids today.  N.p.o. after midnight.  The patient has been cleared by cardiology. We will plan laparoscopic cholecystectomy tomorrow by Dr. Ebbie.  I discussed the procedure briefly with the patient.  This will be discussed with her in further detail tomorrow before surgery.  Tara Bradshaw. Belinda, MD, [facility]  General Surgery   05/03/2024 11:58 AM   Tara Bradshaw Tara Bradshaw 05/03/2024, 11:51 AM         [1] No Known Allergies

## 2024-05-03 NOTE — Progress Notes (Signed)
" °  Echocardiogram 2D Echocardiogram has been performed.  Tara Bradshaw 05/03/2024, 4:59 PM "

## 2024-05-03 NOTE — Progress Notes (Signed)
" ° °  Reviewed lab, K+ was 3.2 this am after IV Lasix .  Will give 10 meq KCl IV x 2 runs since she is NPO.  Recheck BMET in am.   Suzanne Kho, PA-C 05/03/2024 11:30 AM  "

## 2024-05-03 NOTE — H&P (View-Only) (Signed)
 Reason for Consult:  Acute cholecystitis Referring Physician: Vann  Tara Bradshaw is an 39 y.o. female.  HPI: This is a 39 year old female with a past medical history of congestive heart failure, nonischemic cardiomyopathy status post AICD placement, gastroesophageal reflux disease, obesity who presents with several weeks of epigastric and right upper quadrant abdominal pain and pressure.  She started Zepbound  a few months ago and then developed the symptoms.  The pain gets worse when she is eating.  She describes the pain mostly as a pressure.  She reports nausea but no vomiting.  She reports constipation with no diarrhea.  The pain felt worse yesterday so she presented to the emergency department for evaluation.  CT scan showed cholelithiasis and gallbladder wall thickening and pericholecystic edema.  Ultrasound confirmed this.  Liver function test are normal.  White count is normal.  She was admitted to the hospital and has been evaluated by cardiology.  They have cleared her for surgical procedure.  She has been off of her Zepbound  for over a week.  There have not been any improvements in her symptoms.  No previous intra-abdominal surgery Past Medical History:  Diagnosis Date   AICD (automatic cardioverter/defibrillator) present    Allergy    Anemia    CHRONIC   CHF (congestive heart failure) (HCC)    Chronic systolic dysfunction of left ventricle    GERD (gastroesophageal reflux disease)    WITH PREGNANCY   Nonischemic cardiomyopathy (HCC)    Pneumonia    x 1   PVC's (premature ventricular contractions)    Sleep apnea     Past Surgical History:  Procedure Laterality Date   CARDIAC CATHETERIZATION     DILATION AND EVACUATION N/A 11/30/2016   Procedure: DILATATION AND EVACUATION;  Surgeon: Fredirick Glenys RAMAN, MD;  Location: [facility];  Service: Gynecology;  Laterality: N/A;   ICD IMPLANT N/A 02/19/2019   Procedure: ICD IMPLANT;  Surgeon: Kelsie Agent, MD;  Location: [facility];   Service: Cardiovascular;  Laterality: N/A;   LEAD REVISION/REPAIR N/A 04/16/2019   Procedure: LEAD REVISION/REPAIR;  Surgeon: Kelsie Agent, MD;  Location: [facility];  Service: Cardiovascular;  Laterality: N/A;   RIGHT HEART CATH N/A 04/24/2022   Procedure: RIGHT HEART CATH;  Surgeon: Cherrie Toribio SAUNDERS, MD;  Location: [facility];  Service: Cardiovascular;  Laterality: N/A;   RIGHT/LEFT HEART CATH AND CORONARY ANGIOGRAPHY N/A 01/22/2019   Procedure: RIGHT/LEFT HEART CATH AND CORONARY ANGIOGRAPHY;  Surgeon: Cherrie Toribio SAUNDERS, MD;  Location: [facility];  Service: Cardiovascular;  Laterality: N/A;   WISDOM TOOTH EXTRACTION     NO ANESTHESIA    Family History  Problem Relation Age of Onset   Epilepsy Mother    Asthma Mother    Vision loss Mother    Heart disease Father    Breast cancer Maternal Aunt    Cancer Maternal Aunt 52       BREAST   Hyperlipidemia Maternal Aunt    Hearing loss Maternal Aunt    Hearing loss Maternal Uncle    Hypertension Maternal Grandmother    Hearing loss Maternal Grandmother    Kidney disease Maternal Grandmother    Cancer Maternal Grandfather    Hearing loss Maternal Grandfather    Breast cancer Paternal Grandmother    Cancer Paternal Grandmother        BREAST   Heart disease Paternal Grandfather     Social History:  reports that she has never smoked. She has never  been exposed to tobacco smoke. She has never used smokeless tobacco. She reports that she does not drink alcohol and does not use drugs.  Allergies: Allergies[1]  Medications:  Prior to Admission medications  Medication Sig Start Date End Date Taking? Authorizing Provider  acetaminophen  (TYLENOL ) 325 MG tablet Take 650 mg by mouth every 6 (six) hours as needed for mild pain or headache.    [provider]  benzonatate  (TESSALON ) 200 MG capsule Take 1 capsule (200 mg total) by mouth 2 (two) times daily as needed for cough. 04/04/24   Blair, Diane W, FNP   ENTRESTO  97-103 MG TAKE 1 TABLET BY MOUTH TWICE  DAILY 04/15/24   Cindie Ole DASEN, MD  FARXIGA  10 MG TABS tablet TAKE 1 TABLET BY MOUTH DAILY  BEFORE BREAKFAST 11/27/23   Cindie Ole DASEN, MD  fluticasone  (FLONASE ) 50 MCG/ACT nasal spray Place 2 sprays into both nostrils daily. 04/10/24   Vivienne Delon HERO, PA-C  furosemide  (LASIX ) 40 MG tablet Take 2 tablets (80 mg total) by mouth 2 (two) times daily. 10/22/23   Milford, Harlene HERO, FNP  gabapentin  (NEURONTIN ) 300 MG capsule Take 1 capsule (300 mg total) by mouth at bedtime. 04/15/24   Henson, Vickie L, NP-C  levonorgestrel  (MIRENA ) 20 MCG/24HR IUD 1 each by Intrauterine route once.    [provider]  meclizine  (ANTIVERT ) 25 MG tablet Take 1 tablet (25 mg total) by mouth 3 (three) times daily as needed for dizziness. 03/22/23   Bensimhon, Toribio SAUNDERS, MD  Multiple Vitamins-Minerals (WOMENS MULTIVITAMIN PO) Take 1 tablet by mouth daily.    [provider]  NON FORMULARY Pt uses a cpap nightly    [provider]  ondansetron  (ZOFRAN -ODT) 4 MG disintegrating tablet Take 1 tablet (4 mg total) by mouth every 8 (eight) hours as needed for nausea or vomiting. 02/11/24   Bensimhon, Toribio SAUNDERS, MD  pantoprazole  (PROTONIX ) 40 MG tablet Take 1 tablet (40 mg total) by mouth daily. 04/16/24   Lendia Boby CROME, NP-C  potassium chloride  SA (KLOR-CON  M) 20 MEQ tablet TAKE 2 TABLETS BY MOUTH DAILY 11/27/23   Cindie Ole DASEN, MD  spironolactone  (ALDACTONE ) 25 MG tablet Take 1 tablet (25 mg total) by mouth daily. 11/20/23   Milford, Harlene HERO, FNP  ZEPBOUND  5 MG/0.5ML Pen Inject 5 mg into the skin once a week. 04/03/24   Maccia, Melissa D, RPH-CPP     Results for orders placed or performed during the hospital encounter of 05/02/24 (from the past 48 hours)  Basic metabolic panel     Status: Abnormal   Collection Time: 05/02/24  2:54 PM  Result Value Ref Range   Sodium 141 135 - 145 mmol/L   Potassium 3.4 (L) 3.5 - 5.1 mmol/L   Chloride 103  98 - 111 mmol/L   CO2 26 22 - 32 mmol/L   Glucose, Bld 92 70 - 99 mg/dL    Comment: Glucose reference range applies only to samples taken after fasting for at least 8 hours.   BUN 10 6 - 20 mg/dL   Creatinine, Ser 9.25 0.44 - 1.00 mg/dL   Calcium  9.7 8.9 - 10.3 mg/dL   GFR, Estimated >39 >39 mL/min    Comment: (NOTE) Calculated using the CKD-EPI Creatinine Equation (2021)    Anion gap 12 5 - 15    Comment: Performed at [facility], [address], [place], [place] [address]  CBC     Status: Abnormal   Collection Time: 05/02/24  2:54  PM  Result Value Ref Range   WBC 6.5 4.0 - 10.5 K/uL    Comment: REPEATED TO VERIFY   RBC 4.08 3.87 - 5.11 MIL/uL   Hemoglobin 11.1 (L) 12.0 - 15.0 g/dL   HCT 64.9 (L) 63.9 - 53.9 %   MCV 85.8 80.0 - 100.0 fL   MCH 27.2 26.0 - 34.0 pg   MCHC 31.7 30.0 - 36.0 g/dL   RDW 83.9 (H) 88.4 - 84.4 %   Platelets 165 150 - 400 K/uL    Comment: SPECIMEN CHECKED FOR CLOTS REPEATED TO VERIFY    nRBC 0.0 0.0 - 0.2 %    Comment: Performed at [facility], [address] [place], [place] [address]  Hepatic function panel     Status: Abnormal   Collection Time: 05/02/24  2:54 PM  Result Value Ref Range   Total Protein 6.7 6.5 - 8.1 g/dL   Albumin 4.3 3.5 - 5.0 g/dL   AST 35 15 - 41 U/L   ALT 47 (H) 0 - 44 U/L   Alkaline Phosphatase 53 38 - 126 U/L   Total Bilirubin 0.9 0.0 - 1.2 mg/dL   Bilirubin, Direct 0.3 (H) 0.0 - 0.2 mg/dL   Indirect Bilirubin 0.5 0.3 - 0.9 mg/dL    Comment: Performed at [facility], [address] [place], [place] [address]  Lipase, blood     Status: None   Collection Time: 05/02/24  2:54 PM  Result Value Ref Range   Lipase 17 11 - 51 U/L    Comment: Performed at [facility], [address], [place], [place] [address]  Pro Brain natriuretic peptide     Status: Abnormal   Collection Time: 05/02/24  2:54 PM  Result Value Ref Range   Pro Brain  Natriuretic Peptide 3,347.0 (H) <300.0 pg/mL    Comment: (NOTE) Age Group        Cut-Points    Interpretation  < 50 years     450 pg/mL       NT-proBNP > 450 pg/mL indicates                                ADHF is likely              50 to 75 years  900 pg/mL      NT-proBNP > 900 pg/mL indicates          ADHF is likely  > 75 years      1800 pg/mL     NT-proBNP > 1800 pg/mL indicates          ADHF is likely                           All ages    Results between       Indeterminate. Further clinical             300 and the cut-   information is needed to determine            point for age group   if ADHF is present.  Elecsys proBNP II/ Elecsys proBNP II STAT           Cut-Point                       Interpretation  300 pg/mL                    NT-proBNP <300pg/mL indicates                             ADHF is not likely  Performed at [facility], [address], [place], [place] [address]   Troponin T, High Sensitivity     Status: None   Collection Time: 05/02/24  2:54 PM  Result Value Ref Range   Troponin T High Sensitivity 6 0 - 19 ng/L    Comment: (NOTE) Biotin concentrations > 1000 ng/mL falsely decrease TnT results.  Serial cardiac troponin measurements are suggested.  Refer to the Links section for chest pain algorithms and additional  guidance. Performed at [facility], [address] [place], [place] [address]   Pregnancy, urine     Status: None   Collection Time: 05/02/24  3:21 PM  Result Value Ref Range   Preg Test, Ur NEGATIVE NEGATIVE    Comment:        THE SENSITIVITY OF THIS METHODOLOGY IS >20 mIU/mL. Performed at [facility], [address] [place], [place] [address]   Magnesium      Status: None   Collection Time: 05/02/24  3:21 PM  Result Value Ref Range   Magnesium  1.7 1.7 - 2.4 mg/dL    Comment: Performed at [facility], [address], [place], [place] [address]  Comprehensive metabolic panel     Status: Abnormal   Collection Time: 05/03/24  2:37 AM  Result Value Ref Range   Sodium 142 135 - 145 mmol/L   Potassium 3.2 (L) 3.5 - 5.1 mmol/L   Chloride 105 98 - 111 mmol/L   CO2 27 22 - 32 mmol/L   Glucose, Bld 110 (H) 70 - 99 mg/dL    Comment: Glucose reference range applies only to samples taken after fasting for at least 8 hours.   BUN 9 6 - 20 mg/dL   Creatinine, Ser 9.19 0.44 - 1.00 mg/dL   Calcium  8.7 (L) 8.9 - 10.3 mg/dL   Total Protein 5.7 (L) 6.5 - 8.1 g/dL   Albumin 3.7 3.5 - 5.0 g/dL   AST 29 15 - 41 U/L   ALT 43 0 - 44 U/L   Alkaline Phosphatase 45 38 - 126 U/L   Total Bilirubin 0.7 0.0 - 1.2 mg/dL   GFR, Estimated >39 >39 mL/min    Comment: (NOTE) Calculated using the CKD-EPI Creatinine Equation (2021)    Anion gap 10 5 - 15    Comment: Performed at [facility], [address] [place], [place] [address]  CBC     Status: Abnormal   Collection Time: 05/03/24  2:37 AM  Result Value Ref Range   WBC 5.7 4.0 - 10.5 K/uL   RBC 3.67 (L) 3.87 - 5.11 MIL/uL   Hemoglobin 10.1 (L) 12.0 - 15.0 g/dL   HCT 69.0 (L) 63.9 - 53.9 %   MCV 84.2 80.0 - 100.0 fL   MCH 27.5 26.0 - 34.0 pg   MCHC 32.7 30.0 - 36.0 g/dL   RDW 84.0 (H) 88.4 - 84.4 %  Platelets 165 150 - 400 K/uL   nRBC 0.0 0.0 - 0.2 %    Comment: Performed at [facility], [address] [place], [place] [address]  Protime-INR     Status: Abnormal   Collection Time: 05/03/24  2:37 AM  Result Value Ref Range   Prothrombin Time 15.7 (H) 11.4 - 15.2 seconds   INR 1.2 0.8 - 1.2    Comment: (NOTE) INR goal varies based on device and disease states. Performed at [facility] : [facility], [address] [place], [place] [address]   HIV Antibody (routine testing w rflx)     Status: None   Collection Time: 05/03/24  2:37 AM  Result Value Ref Range   HIV Screen 4th Generation wRfx Non Reactive Non Reactive     Comment: Performed at [facility], [address], [place], [place] [address]    US  Abdomen Limited RUQ (LIVER/GB) Result Date: 05/02/2024 EXAM: Right Upper Quadrant Abdominal Ultrasound 05/02/2024 05:01:00 PM TECHNIQUE: Real-time ultrasonography of the right upper quadrant of the abdomen was performed. COMPARISON: CT abdomen and pelvis 05/02/2024. CLINICAL HISTORY: Upper abdominal pain. FINDINGS: LIVER: Normal echogenicity. Circumscribed focal hyperechoic liver lesions are demonstrated with the largest in the left lobe measuring 2 cm in maximal diameter, and the right lobe measuring 1.1 cm. The appearance is consistent with cavernous hemangiomas. No intrahepatic biliary ductal dilatation. Hepatopetal flow in the portal vein. BILIARY SYSTEM: Cholelithiasis with multiple stones in the gallbladder. Gallbladder wall thickening to 0.7 cm. Pericholecystic edema. Murphy's sign is positive. Changes are consistent with acute cholecystitis in the appropriate clinical setting. The Common Bile Duct measures 0.3 cm. No bile duct dilatation. RIGHT KIDNEY: No hydronephrosis. No echogenic calculi. No mass. PANCREAS: Visualized portions of the pancreas are unremarkable. OTHER: No right upper quadrant ascites. IMPRESSION: 1. Acute cholecystitis with cholelithiasis. 2. No bile duct dilatation. Electronically signed by: Elsie Gravely MD 05/02/2024 05:09 PM EST RP Workstation: [id number]   CT ABDOMEN PELVIS W CONTRAST Result Date: 05/02/2024 EXAM: CT ABDOMEN AND PELVIS WITH CONTRAST 05/02/2024 03:51:04 PM TECHNIQUE: CT of the abdomen and pelvis was performed with the administration of 100 mL of iohexol  (OMNIPAQUE ) 300 MG/ML solution. Multiplanar reformatted images are provided for review. Automated exposure control, iterative reconstruction, and/or weight-based adjustment of the mA/kV was utilized to reduce the radiation dose to as low as reasonably achievable. COMPARISON: Comparison with 07/18/2020. CLINICAL HISTORY:  Upper abdominal pain, nausea, constipation, congestive heart failure. FINDINGS: LOWER CHEST: Trace bilateral pleural effusions. Patchy ground-glass infiltrates in the lung bases may represent edema or pneumonia. Cardiac enlargement. LIVER: Mild diffuse fatty infiltration of the liver. No focal liver lesions. GALLBLADDER AND BILE DUCTS: Cholelithiasis with gallbladder wall thickening and pericholecystic edema. Changes likely to indicate acute cholecystitis in the appropriate clinical setting. No bile duct dilatation. SPLEEN: The spleen is normal. PANCREAS: Pancreas is unremarkable. ADRENAL GLANDS: Adrenal glands are unremarkable. KIDNEYS, URETERS AND BLADDER: Kidneys, ureters, and the bladder are unremarkable. No stones in the kidneys or ureters. No hydronephrosis. No perinephric or periureteral stranding. Urinary bladder is unremarkable. GI AND BOWEL: Stomach, small bowel, and colon are not abnormally distended. No wall thickening or inflammatory stranding identified. Appendix is normal. There is no bowel obstruction. PERITONEUM AND RETROPERITONEUM: A small amount of free fluid in the pelvis could be reactive or physiologic. Small amount of free fluid in the right paracolic gutter is likely reactive. No free air. VASCULATURE: Normal caliber abdominal aorta. No dissection. LYMPH NODES: No significant lymphadenopathy. REPRODUCTIVE ORGANS:  The uterus and ovaries are not significantly enlarged. Nodular enhancement in the uterus consistent with fibroid. An intrauterine device is present with central positioning. BONES AND SOFT TISSUES: No acute bony abnormalities. No focal soft tissue abnormality. IMPRESSION: 1. Cholelithiasis with gallbladder wall thickening and pericholecystic edema, consistent with acute cholecystitis. No bile duct dilatation. 2. Trace bilateral pleural effusions with patchy ground-glass opacities in the lung bases, which may reflect edema or pneumonia. 3. Cardiomegaly. Electronically signed by:  Elsie Gravely MD 05/02/2024 03:59 PM EST RP Workstation: [id number]   DG Chest 2 View Result Date: 05/02/2024 EXAM: 2 VIEW(S) XRAY OF THE CHEST 05/02/2024 02:47:21 PM COMPARISON: None available. CLINICAL HISTORY: Shortness of breath. FINDINGS: LINES, TUBES AND DEVICES: Neurostimulator device battery pack overlies right chest. Left chest AICD in place. LUNGS AND PLEURA: Indistinct pulmonary vasculature favoring pulmonary venous hypertension. No overt airspace edema. No blunting of the costophrenic angles. No focal pulmonary opacity. No pleural effusion. No pneumothorax. Lateral view blurred by motion artifact. HEART AND MEDIASTINUM: Cardiomegaly. BONES AND SOFT TISSUES: No acute osseous abnormality. IMPRESSION: 1. Indistinct pulmonary vasculature favoring pulmonary venous hypertension. No overt airspace edema or blunting of the costophrenic angles. 2. Cardiomegaly. 3. Left chest AICD and right chest neurostimulator device battery pack. Electronically signed by: Ryan Salvage MD 05/02/2024 03:10 PM EST RP Workstation: [id number]    Review of Systems  HENT:  Negative for ear discharge, ear pain, hearing loss and tinnitus.   Eyes:  Negative for photophobia and pain.  Respiratory:  Negative for cough and shortness of breath.   Cardiovascular:  Negative for chest pain.  Gastrointestinal:  Positive for abdominal distention, abdominal pain, constipation and nausea. Negative for vomiting.  Genitourinary:  Negative for dysuria, flank pain, frequency and urgency.  Musculoskeletal:  Negative for back pain, myalgias and neck pain.  Neurological:  Negative for dizziness and headaches.  Hematological:  Does not bruise/bleed easily.  Psychiatric/Behavioral:  The patient is not nervous/anxious.    Blood pressure 106/82, pulse 75, temperature 97.6 F (36.4 C), temperature source Oral, resp. rate 18, height 5' 2 (1.575 m), weight 108 kg, SpO2 98%. Physical Exam Constitutional:  WDWN in NAD, conversant, no  obvious deformities; lying in bed comfortably Eyes:  Pupils equal, round; sclera anicteric; moist conjunctiva; no lid lag HENT:  Oral mucosa moist; good dentition  Neck:  No masses palpated, trachea midline; no thyromegaly Lungs:  CTA bilaterally; normal respiratory effort CV:  Regular rate and rhythm; no murmurs; extremities well-perfused with no edema Abd:  +bowel sounds, soft, moderately tender in the epigastrium and right upper quadrant on-tender, no palpable organomegaly; no palpable hernias Musc:  Unable to assess gait; no apparent clubbing or cyanosis in extremities Lymphatic:  No palpable cervical or axillary lymphadenopathy Skin:  Warm, dry; no sign of jaundice Psychiatric - alert and oriented x 4; calm mood and affect  Assessment/Plan: Acute calculous cholecystitis History of congestive heart failure secondary to nonischemic cardiomyopathy AICD  Recommendations: The patient is on antibiotics already.  Clear liquids today.  N.p.o. after midnight.  The patient has been cleared by cardiology. We will plan laparoscopic cholecystectomy tomorrow by Dr. Ebbie.  I discussed the procedure briefly with the patient.  This will be discussed with her in further detail tomorrow before surgery.  Donnice POUR. Belinda, MD, [facility]  General Surgery   05/03/2024 11:58 AM   Donnice POUR Desaray Marschner 05/03/2024, 11:51 AM         [1] No Known Allergies

## 2024-05-03 NOTE — Care Management Obs Status (Signed)
 MEDICARE OBSERVATION STATUS NOTIFICATION   Patient Details  Name: Tara Bradshaw MRN: [id number] Date of Birth: [birth year]   Medicare Observation Status Notification Given:  Yes    Marval Gell, RN 05/03/2024, 1:18 PM

## 2024-05-03 NOTE — Progress Notes (Signed)
 "   Progress Note    Tara Bradshaw  FMW:[id number] DOB: Apr 02, 1986  DOA: 05/02/2024 PCP: Lendia Boby CROME, NP-C    Brief Narrative:   Tara Bradshaw is a 39 y.o. female with medical history significant of nonischemic cardiomyopathy s/p AICD, GERD, PVC's who presented to [facility] ED today referred from urgent care clinic for evaluation of upper abdominal pain.  Patient reports onset about 2 weeks ago that started with mild discomfort in her stomach, she initially attributed to Zepbound  but then it started to get worse.  She reports feeling abdominal pressure like someone sitting on her upper abdomen.  She has had difficulty eating meals due to feeling full quickly and increasing discomfort from the pressure.  She also reports a lot of nausea, some vomiting but only fluid.  Ultrasound suggestive of gallbladder disease and cholecystitis  Assessment/Plan:   Principal Problem:   Acute cholecystitis Active Problems:   Morbid obesity (HCC)   GERD (gastroesophageal reflux disease)   Nonischemic cardiomyopathy (HCC)   ICD (implantable cardioverter-defibrillator) in place   * Acute cholecystitis Patient presented with 2 weeks of upper abdominal pain and pressure, nausea/vomiting.  CT abdomen pelvis with contrast showed cholelithiasis with gallbladder wall thickening and Perry cholecystic edema consistent with acute cholecystitis.  No bile duct dilation was seen.  Subsequent right upper quadrant ultrasound showed findings consistent with acute cholecystitis with cholelithiasis, no bile duct dilation. EDP consulted general surgery, recommended admission, IV antibiotics, cardiology clearance for anticipated cholecystectomy. Labs stable without leukocytosis and overall normal LFTs --Continue IV Zosyn  --N.p.o. after midnight -General Surgery consult pending     ICD (implantable cardioverter-defibrillator) in place Noted. Monitor on telemetry.   Nonischemic cardiomyopathy (HCC) Seems clinically  compensated on admission.  Echo in April 2025 showed EF 20-25% with LV global hypokinesis, severely dilated left atrium, mildly dilated right atrium, mod-severe MR, mild-mod TR. Patient seems fairly well compensated although does have some mild lower extremity edema and basilar rales on exam -Given IV Lasix  with good diuresis - Cardiology consult appreciated   GERD (gastroesophageal reflux disease) Resume PPI  Obesity Estimated body mass index is 43.57 kg/m as calculated from the following:   Height as of this encounter: 5' 2 (1.575 m).   Weight as of this encounter: 108 kg.      Medical Consultants:   Cardiology  general surgery     Subjective:   Denies current nausea  Objective:    Vitals:   05/02/24 2013 05/03/24 0355 05/03/24 0734 05/03/24 1000  BP: (!) 116/94 93/65 95/61  106/82  Pulse: 87 73 73 75  Resp: 18 17 17 18   Temp:  97.8 F (36.6 C) 97.6 F (36.4 C)   TempSrc: Oral Oral Oral   SpO2:  96% 98%   Weight: 110 kg 108 kg    Height: 5' 2 (1.575 m)       Intake/Output Summary (Last 24 hours) at 05/03/2024 1156 Last data filed at 05/03/2024 0736 Gross per 24 hour  Intake 536.68 ml  Output 1300 ml  Net -763.32 ml   Filed Weights   05/02/24 2013 05/03/24 0355  Weight: 110 kg 108 kg    Exam:  General: Appearance:    Severely obese female sitting on side of bed     Lungs:      respirations unlabored  Heart:    Normal heart rate.   MS:   All extremities are intact.   Neurologic:   Awake, alert     Data Reviewed:  I have personally reviewed following labs and imaging studies:  Labs: Labs show the following:   Basic Metabolic Panel: Recent Labs  Lab 05/02/24 1454 05/02/24 1521 05/03/24 0237  NA 141  --  142  K 3.4*  --  3.2*  CL 103  --  105  CO2 26  --  27  GLUCOSE 92  --  110*  BUN 10  --  9  CREATININE 0.74  --  0.80  CALCIUM  9.7  --  8.7*  MG  --  1.7  --    GFR Estimated Creatinine Clearance: 108.2 mL/min (by C-G formula  based on SCr of 0.8 mg/dL). Liver Function Tests: Recent Labs  Lab 05/02/24 1454 05/03/24 0237  AST 35 29  ALT 47* 43  ALKPHOS 53 45  BILITOT 0.9 0.7  PROT 6.7 5.7*  ALBUMIN 4.3 3.7   Recent Labs  Lab 05/02/24 1454  LIPASE 17   No results for input(s): AMMONIA in the last 168 hours. Coagulation profile Recent Labs  Lab 05/03/24 0237  INR 1.2    CBC: Recent Labs  Lab 05/02/24 1454 05/03/24 0237  WBC 6.5 5.7  HGB 11.1* 10.1*  HCT 35.0* 30.9*  MCV 85.8 84.2  PLT 165 165   Cardiac Enzymes: No results for input(s): CKTOTAL, CKMB, CKMBINDEX, TROPONINI in the last 168 hours. BNP (last 3 results) Recent Labs    05/02/24 1454  PROBNP 3,347.0*   CBG: No results for input(s): GLUCAP in the last 168 hours. D-Dimer: No results for input(s): DDIMER in the last 72 hours. Hgb A1c: No results for input(s): HGBA1C in the last 72 hours. Lipid Profile: No results for input(s): CHOL, HDL, LDLCALC, TRIG, CHOLHDL, LDLDIRECT in the last 72 hours. Thyroid  function studies: No results for input(s): TSH, T4TOTAL, T3FREE, THYROIDAB in the last 72 hours.  Invalid input(s): FREET3 Anemia work up: No results for input(s): VITAMINB12, FOLATE, FERRITIN, TIBC, IRON, RETICCTPCT in the last 72 hours. Sepsis Labs: Recent Labs  Lab 05/02/24 1454 05/03/24 0237  WBC 6.5 5.7    Microbiology No results found for this or any previous visit (from the past 240 hours).  Procedures and diagnostic studies:  US  Abdomen Limited RUQ (LIVER/GB) Result Date: 05/02/2024 EXAM: Right Upper Quadrant Abdominal Ultrasound 05/02/2024 05:01:00 PM TECHNIQUE: Real-time ultrasonography of the right upper quadrant of the abdomen was performed. COMPARISON: CT abdomen and pelvis 05/02/2024. CLINICAL HISTORY: Upper abdominal pain. FINDINGS: LIVER: Normal echogenicity. Circumscribed focal hyperechoic liver lesions are demonstrated with the largest in the left  lobe measuring 2 cm in maximal diameter, and the right lobe measuring 1.1 cm. The appearance is consistent with cavernous hemangiomas. No intrahepatic biliary ductal dilatation. Hepatopetal flow in the portal vein. BILIARY SYSTEM: Cholelithiasis with multiple stones in the gallbladder. Gallbladder wall thickening to 0.7 cm. Pericholecystic edema. Murphy's sign is positive. Changes are consistent with acute cholecystitis in the appropriate clinical setting. The Common Bile Duct measures 0.3 cm. No bile duct dilatation. RIGHT KIDNEY: No hydronephrosis. No echogenic calculi. No mass. PANCREAS: Visualized portions of the pancreas are unremarkable. OTHER: No right upper quadrant ascites. IMPRESSION: 1. Acute cholecystitis with cholelithiasis. 2. No bile duct dilatation. Electronically signed by: Elsie Gravely MD 05/02/2024 05:09 PM EST RP Workstation: [id number]   CT ABDOMEN PELVIS W CONTRAST Result Date: 05/02/2024 EXAM: CT ABDOMEN AND PELVIS WITH CONTRAST 05/02/2024 03:51:04 PM TECHNIQUE: CT of the abdomen and pelvis was performed with the administration of 100 mL of iohexol  (OMNIPAQUE ) 300 MG/ML solution. Multiplanar reformatted images are  provided for review. Automated exposure control, iterative reconstruction, and/or weight-based adjustment of the mA/kV was utilized to reduce the radiation dose to as low as reasonably achievable. COMPARISON: Comparison with 07/18/2020. CLINICAL HISTORY: Upper abdominal pain, nausea, constipation, congestive heart failure. FINDINGS: LOWER CHEST: Trace bilateral pleural effusions. Patchy ground-glass infiltrates in the lung bases may represent edema or pneumonia. Cardiac enlargement. LIVER: Mild diffuse fatty infiltration of the liver. No focal liver lesions. GALLBLADDER AND BILE DUCTS: Cholelithiasis with gallbladder wall thickening and pericholecystic edema. Changes likely to indicate acute cholecystitis in the appropriate clinical setting. No bile duct dilatation. SPLEEN: The  spleen is normal. PANCREAS: Pancreas is unremarkable. ADRENAL GLANDS: Adrenal glands are unremarkable. KIDNEYS, URETERS AND BLADDER: Kidneys, ureters, and the bladder are unremarkable. No stones in the kidneys or ureters. No hydronephrosis. No perinephric or periureteral stranding. Urinary bladder is unremarkable. GI AND BOWEL: Stomach, small bowel, and colon are not abnormally distended. No wall thickening or inflammatory stranding identified. Appendix is normal. There is no bowel obstruction. PERITONEUM AND RETROPERITONEUM: A small amount of free fluid in the pelvis could be reactive or physiologic. Small amount of free fluid in the right paracolic gutter is likely reactive. No free air. VASCULATURE: Normal caliber abdominal aorta. No dissection. LYMPH NODES: No significant lymphadenopathy. REPRODUCTIVE ORGANS: The uterus and ovaries are not significantly enlarged. Nodular enhancement in the uterus consistent with fibroid. An intrauterine device is present with central positioning. BONES AND SOFT TISSUES: No acute bony abnormalities. No focal soft tissue abnormality. IMPRESSION: 1. Cholelithiasis with gallbladder wall thickening and pericholecystic edema, consistent with acute cholecystitis. No bile duct dilatation. 2. Trace bilateral pleural effusions with patchy ground-glass opacities in the lung bases, which may reflect edema or pneumonia. 3. Cardiomegaly. Electronically signed by: Elsie Gravely MD 05/02/2024 03:59 PM EST RP Workstation: [id number]   DG Chest 2 View Result Date: 05/02/2024 EXAM: 2 VIEW(S) XRAY OF THE CHEST 05/02/2024 02:47:21 PM COMPARISON: None available. CLINICAL HISTORY: Shortness of breath. FINDINGS: LINES, TUBES AND DEVICES: Neurostimulator device battery pack overlies right chest. Left chest AICD in place. LUNGS AND PLEURA: Indistinct pulmonary vasculature favoring pulmonary venous hypertension. No overt airspace edema. No blunting of the costophrenic angles. No focal pulmonary  opacity. No pleural effusion. No pneumothorax. Lateral view blurred by motion artifact. HEART AND MEDIASTINUM: Cardiomegaly. BONES AND SOFT TISSUES: No acute osseous abnormality. IMPRESSION: 1. Indistinct pulmonary vasculature favoring pulmonary venous hypertension. No overt airspace edema or blunting of the costophrenic angles. 2. Cardiomegaly. 3. Left chest AICD and right chest neurostimulator device battery pack. Electronically signed by: Ryan Salvage MD 05/02/2024 03:10 PM EST RP Workstation: [id number]    Medications:    gabapentin   300 mg Oral QHS   sacubitril -valsartan   1 tablet Oral BID   Continuous Infusions:  piperacillin -tazobactam (ZOSYN )  IV 3.375 g (05/03/24 0522)   potassium chloride        LOS: 1 day   Harlene RAYMOND Bowl  [facility]   How to contact the [facility] Attending or Consulting provider 7A - 7P or covering provider during after hours 7P -7A, for this patient?  Check the care team in [facility] and look for a) attending/consulting [facility] provider listed and b) the [facility] team listed Log into [email or web address] and use [place]'s universal password to access. If you do not have the password, please contact the hospital operator. Locate the [facility] provider you are looking for under [facility] and page to a number that you can be directly reached. If you still have difficulty reaching the provider,  please page the [facility] (Director on Call) for the Hospitalists listed on amion for assistance.  05/03/2024, 11:56 AM           "

## 2024-05-03 NOTE — Plan of Care (Signed)
   Problem: Education: Goal: Knowledge of General Education information will improve Description: Including pain rating scale, medication(s)/side effects and non-pharmacologic comfort measures Outcome: Progressing   Problem: Clinical Measurements: Goal: Ability to maintain clinical measurements within normal limits will improve Outcome: Progressing Goal: Will remain free from infection Outcome: Progressing

## 2024-05-03 NOTE — Consult Note (Addendum)
 "  Cardiology Consultation   Patient ID: Tara Bradshaw MRN: [id number]; DOB: [birth year]  Admit date: 05/02/2024 Date of Consult: 05/03/2024  PCP:  Lendia Boby CROME, NP-C   [facility] Providers Cardiologist:  None  Electrophysiologist:  OLE ONEIDA HOLTS, MD (Inactive)  Advanced Heart Failure:  Toribio Fuel, MD       Patient Profile: Tara Bradshaw is a 39 y.o. female with a hx of nonischemic cardiomyopathy s/p St Jude SICD, HTN and systolic heart failure diagnosed in 06/27/18 w/ LVEF 20-25% , and s/p barostim 10/23.   Admitted in 06/2018 with acute systolic HF with EF 20-25% in setting of recent viral illness and 43-month post-partum status. COVID testing negative.  ZioPatch 8/20 NSR. Occasional PVCs (3.5%) 9-beat run NSVT    At return visit w/ Dr. Fuel 01/15/19. Endorsed fatigue after starting carvedilol .   R/LHC on 01/22/19 which showed severe NICM EF 20%, Normal coronaries, Well compensated hemodynamics with high cardiac output.    CPX 12/21 FVC 2.68 (93%)      FEV1 2.27 (93%)        FEV1/FVC 85 (100%)        MVV 84 (84%)    Resting HR: 70 Standing HR: 70 Peak HR: 135   (73% age predicted max HR)   She is being seen 05/03/2024 for preop evaluation prior to cholecystectomy at the request of Dr Juvenal.  History of Present Illness: [name] had her Barostim device checked 11/29/2023 and the impedance was stable. Last office note 10/2023, pt wt 254 lbs, ReDS 47%, c/o increased DOE >> Lasix  increased to 80 mg bid w/ KCl 60 meq every day.  Pt admitted 01/24 with abd pain, LE edema, dx CHF exacerbation and acute cholecystitis >> needs cholecystectomy, Cards asked to see.  [name] has been feeling bad for a couple of weeks.  She attributed her symptoms to heart failure and was trying to wait until her upcoming appointment with Dr. Bensimhon but could not.  He was a complete shock to her that she needed her gallbladder out.  She got a dose of Lasix  20 mg IV last  p.m. and has urinated frequently since then.  Lower extremity edema is gone and she feels like she is breathing better, but not quite back to baseline.    Her weight has been trending down on Zepbound , so she is not sure what her dry weight is.  With the Zepbound , she has had some nausea and rare diarrhea.  She feels like she is generally tolerating it well.   Past Medical History:  Diagnosis Date   AICD (automatic cardioverter/defibrillator) present    Allergy    Anemia    CHRONIC   CHF (congestive heart failure) (HCC)    Chronic systolic dysfunction of left ventricle    GERD (gastroesophageal reflux disease)    WITH PREGNANCY   Nonischemic cardiomyopathy (HCC)    Pneumonia    x 1   PVC's (premature ventricular contractions)    Sleep apnea     Past Surgical History:  Procedure Laterality Date   CARDIAC CATHETERIZATION     DILATION AND EVACUATION N/A 11/30/2016   Procedure: DILATATION AND EVACUATION;  Surgeon: Fredirick Glenys RAMAN, MD;  Location: [facility];  Service: Gynecology;  Laterality: N/A;   ICD IMPLANT N/A 02/19/2019   Procedure: ICD IMPLANT;  Surgeon: Kelsie Agent, MD;  Location: [facility] INVASIVE CV LAB;  Service: Cardiovascular;  Laterality: N/A;   LEAD REVISION/REPAIR N/A 04/16/2019   Procedure: LEAD REVISION/REPAIR;  Surgeon: Kelsie Agent, MD;  Location: [facility] INVASIVE CV LAB;  Service: Cardiovascular;  Laterality: N/A;   RIGHT HEART CATH N/A 04/24/2022   Procedure: RIGHT HEART CATH;  Surgeon: Cherrie Toribio SAUNDERS, MD;  Location: [facility] INVASIVE CV LAB;  Service: Cardiovascular;  Laterality: N/A;   RIGHT/LEFT HEART CATH AND CORONARY ANGIOGRAPHY N/A 01/22/2019   Procedure: RIGHT/LEFT HEART CATH AND CORONARY ANGIOGRAPHY;  Surgeon: Cherrie Toribio SAUNDERS, MD;  Location: [facility] INVASIVE CV LAB;  Service: Cardiovascular;  Laterality: N/A;   WISDOM TOOTH EXTRACTION     NO ANESTHESIA     Home Medications:  Prior to Admission medications  Medication Sig Start Date End Date Taking? Authorizing  Provider  acetaminophen  (TYLENOL ) 325 MG tablet Take 650 mg by mouth every 6 (six) hours as needed for mild pain or headache.    [provider]  benzonatate  (TESSALON ) 200 MG capsule Take 1 capsule (200 mg total) by mouth 2 (two) times daily as needed for cough. 04/04/24   Blair, Diane W, FNP  ENTRESTO  97-103 MG TAKE 1 TABLET BY MOUTH TWICE  DAILY 04/15/24   Cindie Ole DASEN, MD  FARXIGA  10 MG TABS tablet TAKE 1 TABLET BY MOUTH DAILY  BEFORE BREAKFAST 11/27/23   Cindie Ole DASEN, MD  fluticasone  (FLONASE ) 50 MCG/ACT nasal spray Place 2 sprays into both nostrils daily. 04/10/24   Vivienne Delon HERO, PA-C  furosemide  (LASIX ) 40 MG tablet Take 2 tablets (80 mg total) by mouth 2 (two) times daily. 10/22/23   Milford, Harlene HERO, FNP  gabapentin  (NEURONTIN ) 300 MG capsule Take 1 capsule (300 mg total) by mouth at bedtime. 04/15/24   Henson, Vickie L, NP-C  levonorgestrel  (MIRENA ) 20 MCG/24HR IUD 1 each by Intrauterine route once.    [provider]  meclizine  (ANTIVERT ) 25 MG tablet Take 1 tablet (25 mg total) by mouth 3 (three) times daily as needed for dizziness. 03/22/23   Bensimhon, Toribio SAUNDERS, MD  Multiple Vitamins-Minerals (WOMENS MULTIVITAMIN PO) Take 1 tablet by mouth daily.    [provider]  NON FORMULARY Pt uses a cpap nightly    [provider]  ondansetron  (ZOFRAN -ODT) 4 MG disintegrating tablet Take 1 tablet (4 mg total) by mouth every 8 (eight) hours as needed for nausea or vomiting. 02/11/24   Bensimhon, Toribio SAUNDERS, MD  pantoprazole  (PROTONIX ) 40 MG tablet Take 1 tablet (40 mg total) by mouth daily. 04/16/24   Lendia Boby CROME, NP-C  potassium chloride  SA (KLOR-CON  M) 20 MEQ tablet TAKE 2 TABLETS BY MOUTH DAILY 11/27/23   Cindie Ole DASEN, MD  spironolactone  (ALDACTONE ) 25 MG tablet Take 1 tablet (25 mg total) by mouth daily. 11/20/23   Milford, Harlene HERO, FNP  torsemide  (DEMADEX ) 10 MG tablet Take 10 mg by mouth. 12/24/23   [provider]  ZEPBOUND  5  MG/0.5ML Pen Inject 5 mg into the skin once a week. 04/03/24   Maccia, Melissa D, RPH-CPP    Scheduled Meds:  gabapentin   300 mg Oral QHS   sacubitril -valsartan   1 tablet Oral BID   Continuous Infusions:  piperacillin -tazobactam (ZOSYN )  IV 3.375 g (05/03/24 0522)   PRN Meds: acetaminophen  **OR** acetaminophen , morphine  injection, ondansetron  (ZOFRAN ) IV, oxyCODONE   Allergies:   Allergies[1]  Social History:   Social History   Socioeconomic History   Marital status: Widowed    Spouse name: Not on file   Number of children: Not on file   Years of education:  14   Highest education level: Associate degree: occupational, scientist, product/process development, or  vocational program  Occupational History   Occupation: Event Organiser: [employer]  Tobacco Use   Smoking status: Never    Passive exposure: Never   Smokeless tobacco: Never  Vaping Use   Vaping status: Never Used  Substance and Sexual Activity   Alcohol use: No   Drug use: No   Sexual activity: Yes    Partners: Male    Birth control/protection: I.U.D.  Other Topics Concern   Not on file  Social History Narrative   Lives in [place] with husband and children (6,1)   Unemployed    Previously worked in office manager.   Social Drivers of Health   Tobacco Use: Low Risk (05/02/2024)   Patient History    Smoking Tobacco Use: Never    Smokeless Tobacco Use: Never    Passive Exposure: Never  Financial Resource Strain: Low Risk (02/28/2024)   Overall Financial Resource Strain (CARDIA)    Difficulty of Paying Living Expenses: Not hard at all  Food Insecurity: No Food Insecurity (05/02/2024)   Epic    Worried About Programme Researcher, Broadcasting/film/video in the Last Year: Never true    Ran Out of Food in the Last Year: Never true  Transportation Needs: No Transportation Needs (05/02/2024)   Epic    Lack of Transportation (Medical): No    Lack of Transportation (Non-Medical): No  Physical Activity: Inactive (03/02/2024)   Exercise  Vital Sign    Days of Exercise per Week: 0 days    Minutes of Exercise per Session: 0 min  Stress: No Stress Concern Present (03/02/2024)   [employer] of Occupational Health - Occupational Stress Questionnaire    Feeling of Stress: Not at all  Social Connections: Moderately Integrated (03/02/2024)   Social Connection and Isolation Panel    Frequency of Communication with Friends and Family: More than three times a week    Frequency of Social Gatherings with Friends and Family: More than three times a week    Attends Religious Services: More than 4 times per year    Active Member of [employer] or Organizations: Yes    Attends Banker Meetings: More than 4 times per year    Marital Status: Widowed  Intimate Partner Violence: Not At Risk (05/02/2024)   Epic    Fear of Current or Ex-Partner: No    Emotionally Abused: No    Physically Abused: No    Sexually Abused: No  Depression (PHQ2-9): Low Risk (03/02/2024)   Depression (PHQ2-9)    PHQ-2 Score: 0  Alcohol Screen: Not on file  Housing: Low Risk (05/02/2024)   Epic    Unable to Pay for Housing in the Last Year: No    Number of Times Moved in the Last Year: 0    Homeless in the Last Year: No  Utilities: Not At Risk (05/02/2024)   Epic    Threatened with loss of utilities: No  Health Literacy: Adequate Health Literacy (03/02/2024)   B1300 Health Literacy    Frequency of need for help with medical instructions: Never    Family History:   Family History  Problem Relation Age of Onset   Epilepsy Mother    Asthma Mother    Vision loss Mother    Heart disease Father    Breast cancer Maternal Aunt    Cancer Maternal Aunt 51       BREAST   Hyperlipidemia Maternal Aunt    Hearing loss Maternal Aunt    Hearing loss  Maternal Uncle    Hypertension Maternal Grandmother    Hearing loss Maternal Grandmother    Kidney disease Maternal Grandmother    Cancer Maternal Grandfather    Hearing loss Maternal Grandfather     Breast cancer Paternal Grandmother    Cancer Paternal Grandmother        BREAST   Heart disease Paternal Grandfather      ROS:  Please see the history of present illness.  All other ROS reviewed and negative.     Physical Exam/Data: Vitals:   05/02/24 1950 05/02/24 2013 05/03/24 0355 05/03/24 0734  BP: (!) 116/94 (!) 116/94 93/65 95/61   Pulse: 87 87 73 73  Resp: 18 18 17 17   Temp: 98.3 F (36.8 C)  97.8 F (36.6 C) 97.6 F (36.4 C)  TempSrc: Oral Oral Oral Oral  SpO2:   96% 98%  Weight:  110 kg 108 kg   Height:  5' 2 (1.575 m)      Intake/Output Summary (Last 24 hours) at 05/03/2024 0851 Last data filed at 05/03/2024 0736 Gross per 24 hour  Intake 536.68 ml  Output 1300 ml  Net -763.32 ml      05/03/2024    3:55 AM 05/02/2024    8:13 PM 05/02/2024   12:34 PM  Last 3 Weights  Weight (lbs) 238 lb 3.2 oz 242 lb 9.6 oz 242 lb 9.6 oz  Weight (kg) 108.047 kg 110.043 kg 110.043 kg     Body mass index is 43.57 kg/m.  General:  Well nourished, well developed, in no acute distress HEENT: normal Neck:  JVD 9-10 cm Vascular: No carotid bruits; Distal pulses 2+ bilaterally Cardiac:  normal S1, S2; RRR; no murmur  Lungs:  clear to auscultation bilaterally, no wheezing, rhonchi or rales  Abd: soft, nontender, no hepatomegaly  Ext: no edema Musculoskeletal:  No deformities, BUE and BLE strength normal and equal Skin: warm and dry  Neuro:  CNs 2-12 intact, no focal abnormalities noted Psych:  Normal affect   EKG:  The EKG was personally reviewed and demonstrates:  SR, HR 85 Telemetry:  Telemetry was personally reviewed and demonstrates:  SR w/ aberrant beats  Relevant CV Studies: - Echo 4/25: EF 20-25%, mild to moderate RV dysfunction. MR is more eccentric, moderate to severe MR, mild to moderate TR   - Zio (2/24): NSR avg HR 82 bpm, 45 runs NSVT (longest 8 beats), 2 runs SVT (5 beats max), 12.5% PVCs   - RHC (1/24): RA 13, PA 49/33 (41), PCWP 26, CO/CI (Fick): 6.3/3, PVR  2.3, PAPi 2.5.    - Echo (7/23): EF 25%-30%, severely dilated LV; RV mildly enlarged.    - CPX (03/17/20):  Submax test. No evidence of significant cardiopulmonary limitation despite LV dysfunction. When corrected to ideal body weight, measured pVO2 is normal. Suspect majority of limitation related to obesity.    - Echo (12/21): EF 25-30%   - Echo (10/20): EF 25-30%, (read as 30-35%)  Laboratory Data: High Sensitivity Troponin:  No results for input(s): TROPONINIHS in the last 720 hours.  Recent Labs  Lab 05/02/24 1454  TRNPT 6      Chemistry Recent Labs  Lab 05/02/24 1454 05/02/24 1521 05/03/24 0237  NA 141  --  142  K 3.4*  --  3.2*  CL 103  --  105  CO2 26  --  27  GLUCOSE 92  --  110*  BUN 10  --  9  CREATININE 0.74  --  0.80  CALCIUM  9.7  --  8.7*  MG  --  1.7  --   GFRNONAA >60  --  >60  ANIONGAP 12  --  10    Recent Labs  Lab 05/02/24 1454 05/03/24 0237  PROT 6.7 5.7*  ALBUMIN 4.3 3.7  AST 35 29  ALT 47* 43  ALKPHOS 53 45  BILITOT 0.9 0.7   Lipids No results for input(s): CHOL, TRIG, HDL, LABVLDL, LDLCALC, CHOLHDL in the last 168 hours.  Hematology Recent Labs  Lab 05/02/24 1454 05/03/24 0237  WBC 6.5 5.7  RBC 4.08 3.67*  HGB 11.1* 10.1*  HCT 35.0* 30.9*  MCV 85.8 84.2  MCH 27.2 27.5  MCHC 31.7 32.7  RDW 16.0* 15.9*  PLT 165 165   Thyroid  No results for input(s): TSH, FREET4 in the last 168 hours.  BNP Recent Labs  Lab 05/02/24 1454  PROBNP 3,347.0*    DDimer No results for input(s): DDIMER in the last 168 hours.  Radiology/Studies:  US  Abdomen Limited RUQ (LIVER/GB) Result Date: 05/02/2024 EXAM: Right Upper Quadrant Abdominal Ultrasound 05/02/2024 05:01:00 PM TECHNIQUE: Real-time ultrasonography of the right upper quadrant of the abdomen was performed. COMPARISON: CT abdomen and pelvis 05/02/2024. CLINICAL HISTORY: Upper abdominal pain. FINDINGS: LIVER: Normal echogenicity. Circumscribed focal hyperechoic liver  lesions are demonstrated with the largest in the left lobe measuring 2 cm in maximal diameter, and the right lobe measuring 1.1 cm. The appearance is consistent with cavernous hemangiomas. No intrahepatic biliary ductal dilatation. Hepatopetal flow in the portal vein. BILIARY SYSTEM: Cholelithiasis with multiple stones in the gallbladder. Gallbladder wall thickening to 0.7 cm. Pericholecystic edema. Murphy's sign is positive. Changes are consistent with acute cholecystitis in the appropriate clinical setting. The Common Bile Duct measures 0.3 cm. No bile duct dilatation. RIGHT KIDNEY: No hydronephrosis. No echogenic calculi. No mass. PANCREAS: Visualized portions of the pancreas are unremarkable. OTHER: No right upper quadrant ascites. IMPRESSION: 1. Acute cholecystitis with cholelithiasis. 2. No bile duct dilatation. Electronically signed by: Elsie Gravely MD 05/02/2024 05:09 PM EST RP Workstation: [id number]   CT ABDOMEN PELVIS W CONTRAST Result Date: 05/02/2024 EXAM: CT ABDOMEN AND PELVIS WITH CONTRAST 05/02/2024 03:51:04 PM TECHNIQUE: CT of the abdomen and pelvis was performed with the administration of 100 mL of iohexol  (OMNIPAQUE ) 300 MG/ML solution. Multiplanar reformatted images are provided for review. Automated exposure control, iterative reconstruction, and/or weight-based adjustment of the mA/kV was utilized to reduce the radiation dose to as low as reasonably achievable. COMPARISON: Comparison with 07/18/2020. CLINICAL HISTORY: Upper abdominal pain, nausea, constipation, congestive heart failure. FINDINGS: LOWER CHEST: Trace bilateral pleural effusions. Patchy ground-glass infiltrates in the lung bases may represent edema or pneumonia. Cardiac enlargement. LIVER: Mild diffuse fatty infiltration of the liver. No focal liver lesions. GALLBLADDER AND BILE DUCTS: Cholelithiasis with gallbladder wall thickening and pericholecystic edema. Changes likely to indicate acute cholecystitis in the appropriate  clinical setting. No bile duct dilatation. SPLEEN: The spleen is normal. PANCREAS: Pancreas is unremarkable. ADRENAL GLANDS: Adrenal glands are unremarkable. KIDNEYS, URETERS AND BLADDER: Kidneys, ureters, and the bladder are unremarkable. No stones in the kidneys or ureters. No hydronephrosis. No perinephric or periureteral stranding. Urinary bladder is unremarkable. GI AND BOWEL: Stomach, small bowel, and colon are not abnormally distended. No wall thickening or inflammatory stranding identified. Appendix is normal. There is no bowel obstruction. PERITONEUM AND RETROPERITONEUM: A small amount of free fluid in the pelvis could be reactive or physiologic. Small amount of free fluid in the right paracolic gutter is likely reactive. No  free air. VASCULATURE: Normal caliber abdominal aorta. No dissection. LYMPH NODES: No significant lymphadenopathy. REPRODUCTIVE ORGANS: The uterus and ovaries are not significantly enlarged. Nodular enhancement in the uterus consistent with fibroid. An intrauterine device is present with central positioning. BONES AND SOFT TISSUES: No acute bony abnormalities. No focal soft tissue abnormality. IMPRESSION: 1. Cholelithiasis with gallbladder wall thickening and pericholecystic edema, consistent with acute cholecystitis. No bile duct dilatation. 2. Trace bilateral pleural effusions with patchy ground-glass opacities in the lung bases, which may reflect edema or pneumonia. 3. Cardiomegaly. Electronically signed by: Elsie Gravely MD 05/02/2024 03:59 PM EST RP Workstation: [id number]   DG Chest 2 View Result Date: 05/02/2024 EXAM: 2 VIEW(S) XRAY OF THE CHEST 05/02/2024 02:47:21 PM COMPARISON: None available. CLINICAL HISTORY: Shortness of breath. FINDINGS: LINES, TUBES AND DEVICES: Neurostimulator device battery pack overlies right chest. Left chest AICD in place. LUNGS AND PLEURA: Indistinct pulmonary vasculature favoring pulmonary venous hypertension. No overt airspace edema. No  blunting of the costophrenic angles. No focal pulmonary opacity. No pleural effusion. No pneumothorax. Lateral view blurred by motion artifact. HEART AND MEDIASTINUM: Cardiomegaly. BONES AND SOFT TISSUES: No acute osseous abnormality. IMPRESSION: 1. Indistinct pulmonary vasculature favoring pulmonary venous hypertension. No overt airspace edema or blunting of the costophrenic angles. 2. Cardiomegaly. 3. Left chest AICD and right chest neurostimulator device battery pack. Electronically signed by: Ryan Salvage MD 05/02/2024 03:10 PM EST RP Workstation: [id number]    Assessment and Plan: Acute on Chronic CHF - pt improved w/ 1 dose IV Lasix  20 mg - She had valsartan  97-103 mg last p.m., between that and the Lasix , her SBP is less than 100 so would not give her any more IV Lasix  at this time. - Watch volume status carefully in the perioperative period - She may need more IV Lasix  after the procedure - Get a ReDS vest reading tomorrow if possible - May need to temporarily decrease the Entresto  in the perioperative period - Echo is ordered but would not delay surgery to get this - CHF meds include Farxiga  10 mg (held), Lasix  80 mg bid (held), Kdur 40 meq every day (held), Spironolactone  25 mg every day (held) - Did not tolerate beta-blocker in the past with fatigue - Demadex  10 mg daily is on her home med list, but she is not taking it, will remove it  2.  Preop cardiovascular evaluation - Prior to her acute illness, baseline activity was pretty good - DASI score 9.95 w/ functional capacity and METS of 3.97 - RCRI points are 1 giving her a 0.9% perioperative risk of major cardiac event - No additional cardiac evaluation is required prior to surgery - She is an acceptable risk for the planned procedure   Risk Assessment/Risk Scores:      [place]  Heart Association (NYHA) Functional Class NYHA Class III   For questions or updates, please contact [facility] Please consult  [email or web address] for contact info under      Signed, Evlyn Amason, PA-C  05/03/2024 8:51 AM  I have seen and examined the patient along with Shona Shad, PA-C .  I have reviewed the chart, notes and new data.  I agree with PA/NP's note.  Key new complaints: Breathing is much better after coming in.  Currently only feels a little bit of epigastric fullness, no true abdominal pain. Key examination changes: Appears comfortable lying fully supine in bed, without breathing difficulty.  Regular rate and rhythm with occasional ectopic beats.  Distant holosystolic murmur at the apex.  No gallop heard.  No peripheral edema.  Dry weight is uncertain since she has been losing weight on GLP-1 agonist. Key new findings / data: Telemetry shows relatively frequent PVCs and occasional couplets and one 9 beat run of nonsustained VT but otherwise sinus rhythm.  Most recent ICD interrogation shows that she never requires ventricular pacing and has not received defibrillator shocks for VT.  Most recent echocardiogram shows LVEF 20-25% and cardiopulmonary/stress test earlier this year showed O2 pulse 8.5 (70% of predicted) MVO2 of 9.1 milliliters per kilogram per minute (48% of predicted) , but was 17.7 (57%) when adjusted for ideal body weight.  This suggests that her obesity is a major contributor to her exercise intolerance above and beyond her cardiomyopathy.  PLAN: She has severe cardiomyopathy, but appears to be very well hemodynamically compensated at this time and I do not think there are additional interventions that would reduce her surgical risk.  She requires cholecystectomy for acute cholecystitis.  Moderate risk for perioperative cardiovascular complications.  Avoid excessive intravenous fluids.  Recommend taping a magnet over the left subclavian defibrillator to avoid inappropriate detection of electrocautery that could cause unnecessary ICD intervention.  While this would be unlikely with  infradiaphragmatic surgery, I think placing a magnet is still the safer approach.  Will be happy to follow her in the postoperative period if there are any issues with arrhythmia or heart failure exacerbation.  Jerel Balding, MD, FACC [facility] [phone] 05/03/2024, 9:17 AM      [1] No Known Allergies  "

## 2024-05-03 NOTE — Care Management CC44 (Signed)
"         Condition Code 44 Documentation Completed  Patient Details  Name: Tara Bradshaw MRN: [id number] Date of Birth: [birth year]   Condition Code 44 given:  Yes Patient signature on Condition Code 44 notice:  Yes Documentation of 2 MD's agreement:  Yes Code 44 added to claim:  Yes    Marval Gell, RN 05/03/2024, 1:18 PM  "

## 2024-05-04 ENCOUNTER — Encounter (HOSPITAL_COMMUNITY)
Admission: EM | Disposition: A | Discharge status: Home / Self Care | Payer: Self-pay | Source: Home / Self Care | Attending: Internal Medicine

## 2024-05-04 ENCOUNTER — Observation Stay (HOSPITAL_COMMUNITY): Admitting: Anesthesiology

## 2024-05-04 ENCOUNTER — Ambulatory Visit

## 2024-05-04 ENCOUNTER — Encounter (HOSPITAL_COMMUNITY): Payer: Self-pay | Admitting: Internal Medicine

## 2024-05-04 DIAGNOSIS — I509 Heart failure, unspecified: Secondary | ICD-10-CM

## 2024-05-04 DIAGNOSIS — I5082 Biventricular heart failure: Secondary | ICD-10-CM | POA: Diagnosis present

## 2024-05-04 DIAGNOSIS — Z821 Family history of blindness and visual loss: Secondary | ICD-10-CM | POA: Diagnosis not present

## 2024-05-04 DIAGNOSIS — F32A Depression, unspecified: Secondary | ICD-10-CM | POA: Diagnosis present

## 2024-05-04 DIAGNOSIS — E611 Iron deficiency: Secondary | ICD-10-CM | POA: Diagnosis present

## 2024-05-04 DIAGNOSIS — I5043 Acute on chronic combined systolic (congestive) and diastolic (congestive) heart failure: Secondary | ICD-10-CM | POA: Diagnosis present

## 2024-05-04 DIAGNOSIS — I493 Ventricular premature depolarization: Secondary | ICD-10-CM | POA: Diagnosis present

## 2024-05-04 DIAGNOSIS — I5022 Chronic systolic (congestive) heart failure: Secondary | ICD-10-CM

## 2024-05-04 DIAGNOSIS — K8012 Calculus of gallbladder with acute and chronic cholecystitis without obstruction: Secondary | ICD-10-CM | POA: Diagnosis present

## 2024-05-04 DIAGNOSIS — Z825 Family history of asthma and other chronic lower respiratory diseases: Secondary | ICD-10-CM | POA: Diagnosis not present

## 2024-05-04 DIAGNOSIS — R0989 Other specified symptoms and signs involving the circulatory and respiratory systems: Secondary | ICD-10-CM | POA: Diagnosis present

## 2024-05-04 DIAGNOSIS — Z7984 Long term (current) use of oral hypoglycemic drugs: Secondary | ICD-10-CM | POA: Diagnosis not present

## 2024-05-04 DIAGNOSIS — I110 Hypertensive heart disease with heart failure: Secondary | ICD-10-CM | POA: Diagnosis present

## 2024-05-04 DIAGNOSIS — I272 Pulmonary hypertension, unspecified: Secondary | ICD-10-CM | POA: Diagnosis present

## 2024-05-04 DIAGNOSIS — K5900 Constipation, unspecified: Secondary | ICD-10-CM | POA: Diagnosis present

## 2024-05-04 DIAGNOSIS — Z79899 Other long term (current) drug therapy: Secondary | ICD-10-CM | POA: Diagnosis not present

## 2024-05-04 DIAGNOSIS — I340 Nonrheumatic mitral (valve) insufficiency: Secondary | ICD-10-CM | POA: Diagnosis present

## 2024-05-04 DIAGNOSIS — K812 Acute cholecystitis with chronic cholecystitis: Secondary | ICD-10-CM | POA: Diagnosis not present

## 2024-05-04 DIAGNOSIS — Z8249 Family history of ischemic heart disease and other diseases of the circulatory system: Secondary | ICD-10-CM | POA: Diagnosis not present

## 2024-05-04 DIAGNOSIS — Z6841 Body Mass Index (BMI) 40.0 and over, adult: Secondary | ICD-10-CM | POA: Diagnosis not present

## 2024-05-04 DIAGNOSIS — Z820 Family history of epilepsy and other diseases of the nervous system: Secondary | ICD-10-CM | POA: Diagnosis not present

## 2024-05-04 DIAGNOSIS — Z95810 Presence of automatic (implantable) cardiac defibrillator: Secondary | ICD-10-CM | POA: Diagnosis not present

## 2024-05-04 DIAGNOSIS — G4733 Obstructive sleep apnea (adult) (pediatric): Secondary | ICD-10-CM | POA: Diagnosis present

## 2024-05-04 DIAGNOSIS — I5023 Acute on chronic systolic (congestive) heart failure: Secondary | ICD-10-CM | POA: Diagnosis not present

## 2024-05-04 DIAGNOSIS — K810 Acute cholecystitis: Secondary | ICD-10-CM | POA: Diagnosis present

## 2024-05-04 DIAGNOSIS — K219 Gastro-esophageal reflux disease without esophagitis: Secondary | ICD-10-CM | POA: Diagnosis present

## 2024-05-04 DIAGNOSIS — I10 Essential (primary) hypertension: Secondary | ICD-10-CM | POA: Diagnosis not present

## 2024-05-04 DIAGNOSIS — I428 Other cardiomyopathies: Secondary | ICD-10-CM | POA: Diagnosis present

## 2024-05-04 DIAGNOSIS — Z803 Family history of malignant neoplasm of breast: Secondary | ICD-10-CM | POA: Diagnosis not present

## 2024-05-04 LAB — BASIC METABOLIC PANEL WITH GFR
Anion gap: 7 (ref 5–15)
BUN: 8 mg/dL (ref 6–20)
CO2: 28 mmol/L (ref 22–32)
Calcium: 8.4 mg/dL — ABNORMAL LOW (ref 8.9–10.3)
Chloride: 104 mmol/L (ref 98–111)
Creatinine, Ser: 0.88 mg/dL (ref 0.44–1.00)
GFR, Estimated: >60 mL/min
Glucose, Bld: 99 mg/dL (ref 70–99)
Potassium: 4 mmol/L (ref 3.5–5.1)
Sodium: 140 mmol/L (ref 135–145)

## 2024-05-04 LAB — PREGNANCY, URINE: Preg Test, Ur: NEGATIVE

## 2024-05-04 LAB — MRSA NEXT GEN BY PCR, NASAL: MRSA by PCR Next Gen: NOT DETECTED

## 2024-05-04 MED ORDER — CHLORHEXIDINE GLUCONATE 0.12 % MT SOLN: 15.0000 mL | Freq: Once | OROMUCOSAL | Status: AC

## 2024-05-04 MED ORDER — SCOPOLAMINE 1 MG/3DAYS TD PT72
1.0000 | MEDICATED_PATCH | TRANSDERMAL | Status: DC
  Administered 2024-05-04: 1 mg via TRANSDERMAL
  Filled 2024-05-04: qty 1

## 2024-05-04 MED ORDER — MIDAZOLAM HCL 2 MG/2ML IJ SOLN
INTRAMUSCULAR | Status: AC
  Filled 2024-05-04: qty 2

## 2024-05-04 MED ORDER — PHENYLEPHRINE HCL-NACL 20-0.9 MG/250ML-% IV SOLN
INTRAVENOUS | Status: DC | PRN
  Administered 2024-05-04: 45 ug/min via INTRAVENOUS

## 2024-05-04 MED ORDER — ONDANSETRON HCL 4 MG/2ML IJ SOLN: 4.0000 mg | Freq: Once | INTRAMUSCULAR | Status: DC | PRN

## 2024-05-04 MED ORDER — PROPOFOL 10 MG/ML IV BOLUS
INTRAVENOUS | Status: AC
  Filled 2024-05-04: qty 20

## 2024-05-04 MED ORDER — SODIUM CHLORIDE 0.9 % IR SOLN
Status: DC | PRN
  Administered 2024-05-04: 1

## 2024-05-04 MED ORDER — OXYCODONE HCL 5 MG/5ML PO SOLN: 5.0000 mg | Freq: Once | ORAL | Status: DC | PRN

## 2024-05-04 MED ORDER — LIDOCAINE 2% (20 MG/ML) 5 ML SYRINGE
INTRAMUSCULAR | Status: AC
  Filled 2024-05-04: qty 5

## 2024-05-04 MED ORDER — DEXAMETHASONE SOD PHOSPHATE PF 10 MG/ML IJ SOLN
INTRAMUSCULAR | Status: DC | PRN
  Administered 2024-05-04: 5 mg via INTRAVENOUS

## 2024-05-04 MED ORDER — ROCURONIUM BROMIDE 10 MG/ML (PF) SYRINGE
PREFILLED_SYRINGE | INTRAVENOUS | Status: AC
  Filled 2024-05-04: qty 10

## 2024-05-04 MED ORDER — PHENYLEPHRINE 80 MCG/ML (10ML) SYRINGE FOR IV PUSH (FOR BLOOD PRESSURE SUPPORT)
PREFILLED_SYRINGE | INTRAVENOUS | Status: DC | PRN
  Administered 2024-05-04: 240 ug via INTRAVENOUS
  Administered 2024-05-04: 160 ug via INTRAVENOUS

## 2024-05-04 MED ORDER — CHLORHEXIDINE GLUCONATE 0.12 % MT SOLN
OROMUCOSAL | Status: AC
  Administered 2024-05-04: 15 mL via OROMUCOSAL
  Filled 2024-05-04: qty 15

## 2024-05-04 MED ORDER — BUPIVACAINE-EPINEPHRINE (PF) 0.25% -1:200000 IJ SOLN
INTRAMUSCULAR | Status: AC
  Filled 2024-05-04: qty 30

## 2024-05-04 MED ORDER — NOREPINEPHRINE 4 MG/250ML-% IV SOLN
INTRAVENOUS | Status: AC
  Filled 2024-05-04: qty 250

## 2024-05-04 MED ORDER — FUROSEMIDE 10 MG/ML IJ SOLN
20.0000 mg | Freq: Once | INTRAMUSCULAR | Status: AC
  Administered 2024-05-04: 20 mg via INTRAVENOUS
  Filled 2024-05-04: qty 2

## 2024-05-04 MED ORDER — SUGAMMADEX SODIUM 200 MG/2ML IV SOLN
INTRAVENOUS | Status: DC | PRN
  Administered 2024-05-04: 200 mg via INTRAVENOUS

## 2024-05-04 MED ORDER — KETAMINE HCL 10 MG/ML IJ SOLN
INTRAMUSCULAR | Status: DC | PRN
  Administered 2024-05-04: 50 mg via INTRAVENOUS

## 2024-05-04 MED ORDER — BUPIVACAINE-EPINEPHRINE 0.25% -1:200000 IJ SOLN
INTRAMUSCULAR | Status: DC | PRN
  Administered 2024-05-04: 10 mL

## 2024-05-04 MED ORDER — AMISULPRIDE (ANTIEMETIC) 5 MG/2ML IV SOLN
10.0000 mg | Freq: Once | INTRAVENOUS | Status: AC | PRN
  Administered 2024-05-04: 10 mg via INTRAVENOUS

## 2024-05-04 MED ORDER — ONDANSETRON HCL 4 MG/2ML IJ SOLN
INTRAMUSCULAR | Status: DC | PRN
  Administered 2024-05-04: 4 mg via INTRAVENOUS

## 2024-05-04 MED ORDER — FENTANYL CITRATE (PF) 100 MCG/2ML IJ SOLN
INTRAMUSCULAR | Status: AC
  Filled 2024-05-04: qty 2

## 2024-05-04 MED ORDER — LIDOCAINE 2% (20 MG/ML) 5 ML SYRINGE
INTRAMUSCULAR | Status: DC | PRN
  Administered 2024-05-04: 60 mg via INTRAVENOUS

## 2024-05-04 MED ORDER — ROCURONIUM BROMIDE 10 MG/ML (PF) SYRINGE
PREFILLED_SYRINGE | INTRAVENOUS | Status: DC | PRN
  Administered 2024-05-04: 70 mg via INTRAVENOUS

## 2024-05-04 MED ORDER — ORAL CARE MOUTH RINSE: 15.0000 mL | Freq: Once | OROMUCOSAL | Status: AC

## 2024-05-04 MED ORDER — AMISULPRIDE (ANTIEMETIC) 5 MG/2ML IV SOLN
INTRAVENOUS | Status: AC
  Filled 2024-05-04: qty 4

## 2024-05-04 MED ORDER — DEXAMETHASONE SOD PHOSPHATE PF 10 MG/ML IJ SOLN
INTRAMUSCULAR | Status: AC
  Filled 2024-05-04: qty 1

## 2024-05-04 MED ORDER — OXYCODONE HCL 5 MG PO TABS: 5.0000 mg | ORAL_TABLET | Freq: Once | ORAL | Status: DC | PRN

## 2024-05-04 MED ORDER — HYDROMORPHONE HCL 1 MG/ML IJ SOLN: 0.2500 mg | INTRAMUSCULAR | Status: DC | PRN

## 2024-05-04 MED ORDER — 0.9 % SODIUM CHLORIDE (POUR BTL) OPTIME
TOPICAL | Status: DC | PRN
  Administered 2024-05-04: 1000 mL

## 2024-05-04 MED ORDER — KETAMINE HCL 50 MG/5ML IJ SOSY
PREFILLED_SYRINGE | INTRAMUSCULAR | Status: AC
  Filled 2024-05-04: qty 5

## 2024-05-04 MED ORDER — PROPOFOL 10 MG/ML IV BOLUS
INTRAVENOUS | Status: DC | PRN
  Administered 2024-05-04: 150 mg via INTRAVENOUS

## 2024-05-04 MED ORDER — MIDAZOLAM HCL (PF) 2 MG/2ML IJ SOLN
INTRAMUSCULAR | Status: DC | PRN
  Administered 2024-05-04: 2 mg via INTRAVENOUS

## 2024-05-04 MED ORDER — KETOROLAC TROMETHAMINE 30 MG/ML IJ SOLN
30.0000 mg | Freq: Once | INTRAMUSCULAR | Status: AC | PRN
  Administered 2024-05-04: 30 mg via INTRAVENOUS

## 2024-05-04 MED ORDER — INDOCYANINE GREEN 25 MG IJ SOLR
2.5000 mg | INTRAMUSCULAR | Status: AC
  Administered 2024-05-04: 2.5 mg via INTRAVENOUS

## 2024-05-04 MED ORDER — KETOROLAC TROMETHAMINE 30 MG/ML IJ SOLN
INTRAMUSCULAR | Status: AC
  Filled 2024-05-04: qty 1

## 2024-05-04 MED ORDER — ACETAMINOPHEN 500 MG PO TABS
1000.0000 mg | ORAL_TABLET | Freq: Once | ORAL | Status: AC
  Administered 2024-05-04: 1000 mg via ORAL
  Filled 2024-05-04: qty 2

## 2024-05-04 MED ORDER — ONDANSETRON HCL 4 MG/2ML IJ SOLN
INTRAMUSCULAR | Status: AC
  Filled 2024-05-04: qty 2

## 2024-05-04 MED ORDER — LACTATED RINGERS IV SOLN: INTRAVENOUS | Status: DC

## 2024-05-04 MED ORDER — FENTANYL CITRATE (PF) 250 MCG/5ML IJ SOLN
INTRAMUSCULAR | Status: DC | PRN
  Administered 2024-05-04: 100 ug via INTRAVENOUS

## 2024-05-04 NOTE — Progress Notes (Signed)
 "   Progress Note    Tara Bradshaw  FMW:[id number] DOB: 1986/02/12  DOA: 05/02/2024 PCP: Lendia Boby CROME, NP-C    Brief Narrative:   Tara Bradshaw is a 39 y.o. female with medical history significant of nonischemic cardiomyopathy s/p AICD, GERD, PVC's who presented to [facility] ED today referred from urgent care clinic for evaluation of upper abdominal pain.  Patient reports onset about 2 weeks ago that started with mild discomfort in her stomach, she initially attributed to Zepbound  but then it started to get worse.  She reports feeling abdominal pressure like someone sitting on her upper abdomen.  She has had difficulty eating meals due to feeling full quickly and increasing discomfort from the pressure.  She also reports a lot of nausea, some vomiting but only fluid.  Ultrasound suggestive of gallbladder disease and cholecystitis  Assessment/Plan:   Principal Problem:   Acute cholecystitis Active Problems:   Morbid obesity (HCC)   GERD (gastroesophageal reflux disease)   Nonischemic cardiomyopathy (HCC)   ICD (implantable cardioverter-defibrillator) in place   * Acute cholecystitis Patient presented with 2 weeks of upper abdominal pain and pressure, nausea/vomiting.  CT abdomen pelvis with contrast showed cholelithiasis with gallbladder wall thickening and Perry cholecystic edema consistent with acute cholecystitis.  No bile duct dilation was seen.  Subsequent right upper quadrant ultrasound showed findings consistent with acute cholecystitis with cholelithiasis, no bile duct dilation. EDP consulted general surgery, recommended admission, IV antibiotics, cardiology clearance for anticipated cholecystectomy. Labs stable without leukocytosis and overall normal LFTs --Continue IV Zosyn  -General Surgery planning on gallbladder removal on 1/26     ICD (implantable cardioverter-defibrillator) in place Noted. Monitor on telemetry.   Nonischemic cardiomyopathy (HCC) Seems clinically  compensated on admission.  Echo in April 2025 showed EF 20-25% with LV global hypokinesis, severely dilated left atrium, mildly dilated right atrium, mod-severe MR, mild-mod TR. Patient seems fairly well compensated although does have some mild lower extremity edema and basilar rales on exam -Given IV Lasix  with good diuresis - Cardiology consult appreciated   GERD (gastroesophageal reflux disease) Resume PPI  Obesity Estimated body mass index is 42.8 kg/m as calculated from the following:   Height as of this encounter: 5' 2 (1.575 m).   Weight as of this encounter: 106.1 kg.      Medical Consultants:   Cardiology  general surgery     Subjective:   Complaining of some shortness of breath  Objective:    Vitals:   05/04/24 0807 05/04/24 0910 05/04/24 1223 05/04/24 1230  BP: 102/80 106/72 (!) 127/90 94/83  Pulse: 78 84 89 83  Resp: 20 15 19 16   Temp: 97.8 F (36.6 C) 97.9 F (36.6 C) 98 F (36.7 C)   TempSrc: Oral Oral    SpO2: 100% 99% 96% 93%  Weight:  106.1 kg    Height:  5' 2 (1.575 m)      Intake/Output Summary (Last 24 hours) at 05/04/2024 1246 Last data filed at 05/04/2024 0800 Gross per 24 hour  Intake 50 ml  Output 500 ml  Net -450 ml   Filed Weights   05/03/24 0355 05/04/24 0715 05/04/24 0910  Weight: 108 kg 109.7 kg 106.1 kg    Exam:  General: Appearance:    Severely obese female sitting on side of bed     Lungs:      respirations unlabored  Heart:    Normal heart rate.   MS:   All extremities are intact.   Neurologic:  Awake, alert     Data Reviewed:   I have personally reviewed following labs and imaging studies:  Labs: Labs show the following:   Basic Metabolic Panel: Recent Labs  Lab 05/02/24 1454 05/02/24 1521 05/03/24 0237 05/04/24 0044  NA 141  --  142 140  K 3.4*  --  3.2* 4.0  CL 103  --  105 104  CO2 26  --  27 28  GLUCOSE 92  --  110* 99  BUN 10  --  9 8  CREATININE 0.74  --  0.80 0.88  CALCIUM  9.7  --  8.7*  8.4*  MG  --  1.7  --   --    GFR Estimated Creatinine Clearance: 97.3 mL/min (by C-G formula based on SCr of 0.88 mg/dL). Liver Function Tests: Recent Labs  Lab 05/02/24 1454 05/03/24 0237  AST 35 29  ALT 47* 43  ALKPHOS 53 45  BILITOT 0.9 0.7  PROT 6.7 5.7*  ALBUMIN 4.3 3.7   Recent Labs  Lab 05/02/24 1454  LIPASE 17   No results for input(s): AMMONIA in the last 168 hours. Coagulation profile Recent Labs  Lab 05/03/24 0237  INR 1.2    CBC: Recent Labs  Lab 05/02/24 1454 05/03/24 0237  WBC 6.5 5.7  HGB 11.1* 10.1*  HCT 35.0* 30.9*  MCV 85.8 84.2  PLT 165 165   Cardiac Enzymes: No results for input(s): CKTOTAL, CKMB, CKMBINDEX, TROPONINI in the last 168 hours. BNP (last 3 results) Recent Labs    05/02/24 1454  PROBNP 3,347.0*   CBG: No results for input(s): GLUCAP in the last 168 hours. D-Dimer: No results for input(s): DDIMER in the last 72 hours. Hgb A1c: No results for input(s): HGBA1C in the last 72 hours. Lipid Profile: No results for input(s): CHOL, HDL, LDLCALC, TRIG, CHOLHDL, LDLDIRECT in the last 72 hours. Thyroid  function studies: No results for input(s): TSH, T4TOTAL, T3FREE, THYROIDAB in the last 72 hours.  Invalid input(s): FREET3 Anemia work up: No results for input(s): VITAMINB12, FOLATE, FERRITIN, TIBC, IRON, RETICCTPCT in the last 72 hours. Sepsis Labs: Recent Labs  Lab 05/02/24 1454 05/03/24 0237  WBC 6.5 5.7    Microbiology Recent Results (from the past 240 hours)  MRSA Next Gen by PCR, Nasal     Status: None   Collection Time: 05/04/24  9:28 AM   Specimen: Nasal Mucosa; Nasal Swab  Result Value Ref Range Status   MRSA by PCR Next Gen NOT DETECTED NOT DETECTED Final    Comment: (NOTE) The GeneXpert MRSA Assay (FDA approved for NASAL specimens only), is one component of a comprehensive MRSA colonization surveillance program. It is not intended to diagnose MRSA  infection nor to guide or monitor treatment for MRSA infections. Test performance is not FDA approved in patients less than 48 years old. Performed at [facility], [address] [place], [place] [address]     Procedures and diagnostic studies:  ECHOCARDIOGRAM COMPLETE Result Date: 05/03/2024    ECHOCARDIOGRAM REPORT   Patient Name:   DUTCHESS CROSLAND Date of Exam: 05/03/2024 Medical Rec #:  [id number]      Height:       62.0 in Accession #:    [id number]     Weight:       238.2 lb Date of Birth:  28-Jan-1986      BSA:          2.059 m Patient Age:    38 years  BP:           110/81 mmHg Patient Gender: F              HR:           84 bpm. Exam Location:  Inpatient Procedure: 2D Echo (Both Spectral and Color Flow Doppler were utilized during            procedure). Indications:    nonischemic cardiomyopathy  History:        Patient has prior history of Echocardiogram examinations, most                 recent 07/29/2023. Cardiomyopathy; Defibrillator.  Sonographer:    Tinnie Barefoot RDCS Referring Phys: [id number] KELLY A GRIFFITH IMPRESSIONS  1. Global hypokinesis with akinesis of the inferior wall. Left ventricular ejection fraction, by estimation, is <20%. The left ventricle has severely decreased function. The left ventricle demonstrates global hypokinesis. The left ventricular internal cavity size was mildly to moderately dilated. Left ventricular diastolic parameters are consistent with Grade II diastolic dysfunction (pseudonormalization).  2. Right ventricular systolic function is moderately reduced. The right ventricular size is normal. There is severely elevated pulmonary artery systolic pressure. The estimated right ventricular systolic pressure is 65.7 mmHg.  3. Left atrial size was severely dilated.  4. Right atrial size was moderately dilated.  5. The mitral valve is normal in structure. Moderate to severe mitral valve regurgitation. No evidence of mitral stenosis.  6. Tricuspid valve  regurgitation is moderate.  7. The aortic valve is normal in structure. Aortic valve regurgitation is not visualized. No aortic stenosis is present.  8. The inferior vena cava is normal in size with greater than 50% respiratory variability, suggesting right atrial pressure of 3 mmHg. FINDINGS  Left Ventricle: Global hypokinesis with akinesis of the inferior wall. Left ventricular ejection fraction, by estimation, is <20%. The left ventricle has severely decreased function. The left ventricle demonstrates global hypokinesis. The left ventricular internal cavity size was mildly to moderately dilated. There is no left ventricular hypertrophy. Left ventricular diastolic parameters are consistent with Grade II diastolic dysfunction (pseudonormalization). Right Ventricle: The right ventricular size is normal. No increase in right ventricular wall thickness. Right ventricular systolic function is moderately reduced. There is severely elevated pulmonary artery systolic pressure. The tricuspid regurgitant velocity is 3.56 m/s, and with an assumed right atrial pressure of 15 mmHg, the estimated right ventricular systolic pressure is 65.7 mmHg. Left Atrium: Left atrial size was severely dilated. Right Atrium: Right atrial size was moderately dilated. Pericardium: There is no evidence of pericardial effusion. Mitral Valve: The mitral valve is normal in structure. Moderate to severe mitral valve regurgitation, with posteriorly-directed jet. No evidence of mitral valve stenosis. Tricuspid Valve: The tricuspid valve is normal in structure. Tricuspid valve regurgitation is moderate . No evidence of tricuspid stenosis. Aortic Valve: The aortic valve is normal in structure. Aortic valve regurgitation is not visualized. No aortic stenosis is present. Pulmonic Valve: The pulmonic valve was normal in structure. Pulmonic valve regurgitation is mild. No evidence of pulmonic stenosis. Aorta: The aortic root is normal in size and structure.  Venous: The inferior vena cava is normal in size with greater than 50% respiratory variability, suggesting right atrial pressure of 3 mmHg. IAS/Shunts: No atrial level shunt detected by color flow Doppler. Additional Comments: A device lead is visualized in the right ventricle.  LEFT VENTRICLE PLAX 2D LVIDd:         6.20 cm  Diastology LVIDs:         6.00 cm      LV e' medial:    4.24 cm/s LV PW:         1.10 cm      LV E/e' medial:  19.2 LV IVS:        1.00 cm      LV e' lateral:   7.62 cm/s LVOT diam:     2.30 cm      LV E/e' lateral: 10.7 LV SV:         32 LV SV Index:   15 LVOT Area:     4.15 cm LV IVRT:       77 msec  LV Volumes (MOD) LV vol d, MOD A2C: 193.0 ml LV vol d, MOD A4C: 168.0 ml LV vol s, MOD A2C: 144.0 ml LV vol s, MOD A4C: 123.0 ml LV SV MOD A2C:     49.0 ml LV SV MOD A4C:     168.0 ml LV SV MOD BP:      47.5 ml RIGHT VENTRICLE             IVC RV Basal diam:  3.65 cm     IVC diam: 2.40 cm RV S prime:     12.00 cm/s TAPSE (M-mode): 2.4 cm LEFT ATRIUM              Index        RIGHT ATRIUM           Index LA diam:        5.50 cm  2.67 cm/m   RA Area:     23.20 cm LA Vol (A2C):   81.5 ml  39.58 ml/m  RA Volume:   77.50 ml  37.64 ml/m LA Vol (A4C):   102.0 ml 49.54 ml/m LA Biplane Vol: 98.3 ml  47.74 ml/m  AORTIC VALVE LVOT Vmax:   54.80 cm/s LVOT Vmean:  34.500 cm/s LVOT VTI:    0.076 m  AORTA Ao Root diam: 3.00 cm Ao Asc diam:  2.90 cm MITRAL VALVE               TRICUSPID VALVE MV Area (PHT): 7.16 cm    TR Peak grad:   50.7 mmHg MV Decel Time: 106 msec    TR Vmax:        356.00 cm/s MR PISA:        2.26 cm MR PISA Radius: 0.60 cm    SHUNTS MV E velocity: 81.20 cm/s  Systemic VTI:  0.08 m MV A velocity: 37.90 cm/s  Systemic Diam: 2.30 cm MV E/A ratio:  2.14 Toribio Fuel MD Electronically signed by Toribio Fuel MD Signature Date/Time: 05/03/2024/10:50:56 PM    Final    US  Abdomen Limited RUQ (LIVER/GB) Result Date: 05/02/2024 EXAM: Right Upper Quadrant Abdominal Ultrasound  05/02/2024 05:01:00 PM TECHNIQUE: Real-time ultrasonography of the right upper quadrant of the abdomen was performed. COMPARISON: CT abdomen and pelvis 05/02/2024. CLINICAL HISTORY: Upper abdominal pain. FINDINGS: LIVER: Normal echogenicity. Circumscribed focal hyperechoic liver lesions are demonstrated with the largest in the left lobe measuring 2 cm in maximal diameter, and the right lobe measuring 1.1 cm. The appearance is consistent with cavernous hemangiomas. No intrahepatic biliary ductal dilatation. Hepatopetal flow in the portal vein. BILIARY SYSTEM: Cholelithiasis with multiple stones in the gallbladder. Gallbladder wall thickening to 0.7 cm. Pericholecystic edema. Murphy's sign is positive. Changes are consistent with acute cholecystitis in the appropriate clinical setting. The  Common Bile Duct measures 0.3 cm. No bile duct dilatation. RIGHT KIDNEY: No hydronephrosis. No echogenic calculi. No mass. PANCREAS: Visualized portions of the pancreas are unremarkable. OTHER: No right upper quadrant ascites. IMPRESSION: 1. Acute cholecystitis with cholelithiasis. 2. No bile duct dilatation. Electronically signed by: Elsie Gravely MD 05/02/2024 05:09 PM EST RP Workstation: [id number]   CT ABDOMEN PELVIS W CONTRAST Result Date: 05/02/2024 EXAM: CT ABDOMEN AND PELVIS WITH CONTRAST 05/02/2024 03:51:04 PM TECHNIQUE: CT of the abdomen and pelvis was performed with the administration of 100 mL of iohexol  (OMNIPAQUE ) 300 MG/ML solution. Multiplanar reformatted images are provided for review. Automated exposure control, iterative reconstruction, and/or weight-based adjustment of the mA/kV was utilized to reduce the radiation dose to as low as reasonably achievable. COMPARISON: Comparison with 07/18/2020. CLINICAL HISTORY: Upper abdominal pain, nausea, constipation, congestive heart failure. FINDINGS: LOWER CHEST: Trace bilateral pleural effusions. Patchy ground-glass infiltrates in the lung bases may represent edema  or pneumonia. Cardiac enlargement. LIVER: Mild diffuse fatty infiltration of the liver. No focal liver lesions. GALLBLADDER AND BILE DUCTS: Cholelithiasis with gallbladder wall thickening and pericholecystic edema. Changes likely to indicate acute cholecystitis in the appropriate clinical setting. No bile duct dilatation. SPLEEN: The spleen is normal. PANCREAS: Pancreas is unremarkable. ADRENAL GLANDS: Adrenal glands are unremarkable. KIDNEYS, URETERS AND BLADDER: Kidneys, ureters, and the bladder are unremarkable. No stones in the kidneys or ureters. No hydronephrosis. No perinephric or periureteral stranding. Urinary bladder is unremarkable. GI AND BOWEL: Stomach, small bowel, and colon are not abnormally distended. No wall thickening or inflammatory stranding identified. Appendix is normal. There is no bowel obstruction. PERITONEUM AND RETROPERITONEUM: A small amount of free fluid in the pelvis could be reactive or physiologic. Small amount of free fluid in the right paracolic gutter is likely reactive. No free air. VASCULATURE: Normal caliber abdominal aorta. No dissection. LYMPH NODES: No significant lymphadenopathy. REPRODUCTIVE ORGANS: The uterus and ovaries are not significantly enlarged. Nodular enhancement in the uterus consistent with fibroid. An intrauterine device is present with central positioning. BONES AND SOFT TISSUES: No acute bony abnormalities. No focal soft tissue abnormality. IMPRESSION: 1. Cholelithiasis with gallbladder wall thickening and pericholecystic edema, consistent with acute cholecystitis. No bile duct dilatation. 2. Trace bilateral pleural effusions with patchy ground-glass opacities in the lung bases, which may reflect edema or pneumonia. 3. Cardiomegaly. Electronically signed by: Elsie Gravely MD 05/02/2024 03:59 PM EST RP Workstation: [id number]   DG Chest 2 View Result Date: 05/02/2024 EXAM: 2 VIEW(S) XRAY OF THE CHEST 05/02/2024 02:47:21 PM COMPARISON: None available.  CLINICAL HISTORY: Shortness of breath. FINDINGS: LINES, TUBES AND DEVICES: Neurostimulator device battery pack overlies right chest. Left chest AICD in place. LUNGS AND PLEURA: Indistinct pulmonary vasculature favoring pulmonary venous hypertension. No overt airspace edema. No blunting of the costophrenic angles. No focal pulmonary opacity. No pleural effusion. No pneumothorax. Lateral view blurred by motion artifact. HEART AND MEDIASTINUM: Cardiomegaly. BONES AND SOFT TISSUES: No acute osseous abnormality. IMPRESSION: 1. Indistinct pulmonary vasculature favoring pulmonary venous hypertension. No overt airspace edema or blunting of the costophrenic angles. 2. Cardiomegaly. 3. Left chest AICD and right chest neurostimulator device battery pack. Electronically signed by: Ryan Salvage MD 05/02/2024 03:10 PM EST RP Workstation: [id number]    Medications:    [MAR Hold] gabapentin   300 mg Oral QHS   [MAR Hold] sacubitril -valsartan   1 tablet Oral BID   scopolamine   1 patch Transdermal Q72H   Continuous Infusions:  lactated ringers      [MAR Hold] piperacillin -tazobactam (ZOSYN )  IV 3.375 g (  05/04/24 9347)     LOS: 1 day   Harlene RAYMOND Bowl  [facility]   How to contact the [facility] Attending or Consulting provider 7A - 7P or covering provider during after hours 7P -7A, for this patient?  Check the care team in [facility] and look for a) attending/consulting [facility] provider listed and b) the [facility] team listed Log into [email or web address] and use [place]'s universal password to access. If you do not have the password, please contact the hospital operator. Locate the [facility] provider you are looking for under [facility] and page to a number that you can be directly reached. If you still have difficulty reaching the provider, please page the [facility] (Director on Call) for the Hospitalists listed on amion for assistance.  05/04/2024, 12:46 PM           "

## 2024-05-04 NOTE — Plan of Care (Signed)
   Problem: Health Behavior/Discharge Planning: Goal: Ability to manage health-related needs will improve Outcome: Progressing   Problem: Clinical Measurements: Goal: Ability to maintain clinical measurements within normal limits will improve Outcome: Progressing Goal: Diagnostic test results will improve Outcome: Progressing

## 2024-05-04 NOTE — Anesthesia Procedure Notes (Signed)
 Arterial Line Insertion Start/End1/26/2026 10:10 AM, 05/04/2024 10:20 AM Performed by: Leopoldo Wanda DASEN, CRNA, CRNA  Patient location: Pre-op. Preanesthetic checklist: patient identified, IV checked, site marked, risks and benefits discussed, surgical consent, monitors and equipment checked, pre-op evaluation, timeout performed and anesthesia consent Lidocaine  1% used for infiltration Left, radial was placed Catheter size: 20 G Hand hygiene performed  and maximum sterile barriers used   Attempts: 1 Following insertion, dressing applied and Biopatch. Post procedure assessment: normal  Patient tolerated the procedure well with no immediate complications.

## 2024-05-04 NOTE — Op Note (Signed)
 Preoperative diagnosis: cholecystitis Postoperative diagnosis: acute on chronic cholecystitis Procedure: Laparoscopic cholecystectomy Surgeon: Dr. Adina Bury Anesthesia: General Estimated blood loss: Minimal Complications: None Drains: None Specimens: Gallbladder and contents to pathology Special count was correct completion Disposition recovery stable condition   Indications: 38 yof with cardiac comorbidities who presents with ruq pain. She has us  and ct with gallstones. We discussed proceeding with lap chole.    Procedure: After informed consent was obtained she was taken to the operating room.  She was already on antibiotics.  SCDs were placed.  She was placed under general anesthesia without complication.  She was prepped and draped in standard sterile surgical fashion.  A surgical timeout was then performed.   ICG dye had been given prior to the procedure.   I infiltrated Marcaine  in midclavicular line on the left. I then inserted a Veress needle.  This entered easily. Saline drop test positive. Opening pressure was normal.   I then insufflated the abdomen to 15 mmHg pressure. I then inserted a direct optical entry trocar without difficulty.   I placed a 12 mm trocar in the lower abdomen. I was able to not take all the adhesions down.  I then inserted 3 additional 5 mm trocars in the epigastrium and upper abdomen without difficulty.  Her gallbladder was retracted cephalad. She had acute on chronic cholecystitis.   I was then able to dissect the triangle.  I clearly obtained the critical view of safety observing 2 structures entering the gallbladder.  I took the gallbladder off of the liver bed halfway up the gallbladder.  I then decided to clip and divide the artery. I confirmed this with green dye.  This left me with my cystic duct.   I then clipped this and divided it leaving two clips in place.  The duct was viable and the clips traversed the duct.   I placed this in a retrieval bag and  removed from the abdomen.  Hemostasis was obtained on the liver.I irrigated until this was clear.  I then removed my 12mm trocar. I  used the suture passer device to place 2  sutures to completely obliterate the defect.  I removed the remaining trocars and desufflated the abdomen.  I then closed this with 4-0 Monocryl and glue.  She tolerated this well was extubated and transferred to recovery stable.

## 2024-05-04 NOTE — Anesthesia Postprocedure Evaluation (Signed)
"   Anesthesia Post Note  Patient: Tara Bradshaw  Procedure(s) Performed: LAPAROSCOPIC CHOLECYSTECTOMY INDOCYANINE GREEN  FLUORESCENCE IMAGING (ICG)     Patient location during evaluation: PACU Anesthesia Type: General Level of consciousness: awake and alert, oriented and patient cooperative Pain management: pain level controlled Vital Signs Assessment: post-procedure vital signs reviewed and stable Respiratory status: spontaneous breathing, nonlabored ventilation and respiratory function stable Cardiovascular status: blood pressure returned to baseline and stable Postop Assessment: no apparent nausea or vomiting Anesthetic complications: no   No notable events documented.  Last Vitals:  Vitals:   05/04/24 1329 05/04/24 1330  BP: (!) 117/96 (!) 131/103  Pulse:    Resp:    Temp:    SpO2:      Last Pain:  Vitals:   05/04/24 1354  TempSrc:   PainSc: 8                  Almarie HERO Albena Comes      "

## 2024-05-04 NOTE — Anesthesia Preprocedure Evaluation (Addendum)
 "                                  Anesthesia Evaluation  Patient identified by MRN, date of birth, ID band Patient awake    Reviewed: Allergy & Precautions, H&P , NPO status , Patient's Chart, lab work & pertinent test results  Airway Mallampati: II  TM Distance: >3 FB Neck ROM: Full    Dental no notable dental hx. (+) Teeth Intact, Dental Advisory Given   Pulmonary sleep apnea and Continuous Positive Airway Pressure Ventilation    Pulmonary exam normal breath sounds clear to auscultation       Cardiovascular +CHF (LVEF <20%)  Normal cardiovascular exam+ Cardiac Defibrillator (has never gone off)  Rhythm:Regular Rate:Normal  Echo 05/03/24: 1. Global hypokinesis with akinesis of the inferior wall. Left  ventricular ejection fraction, by estimation, is <20%. The left ventricle  has severely decreased function. The left ventricle demonstrates global  hypokinesis. The left ventricular internal  cavity size was mildly to moderately dilated. Left ventricular diastolic  parameters are consistent with Grade II diastolic dysfunction  (pseudonormalization).   2. Right ventricular systolic function is moderately reduced. The right  ventricular size is normal. There is severely elevated pulmonary artery  systolic pressure. The estimated right ventricular systolic pressure is  65.7 mmHg.   3. Left atrial size was severely dilated.   4. Right atrial size was moderately dilated.   5. The mitral valve is normal in structure. Moderate to severe mitral  valve regurgitation. No evidence of mitral stenosis.   6. Tricuspid valve regurgitation is moderate.   7. The aortic valve is normal in structure. Aortic valve regurgitation is  not visualized. No aortic stenosis is present.   8. The inferior vena cava is normal in size with greater than 50%  respiratory variability, suggesting right atrial pressure of 3 mmHg.   Cath 2024 1. Biventricular HF with elevated filling pressures 2.  Preserved cardiac output 3. Frequent PVCs   dose of Lasix  20 mg IV last p.m. and has urinated frequently since then.  Lower extremity edema is gone and she feels like she is breathing better, but not quite back to baseline.  HF team following.    Neuro/Psych negative neurological ROS  negative psych ROS   GI/Hepatic Neg liver ROS,GERD  Controlled,,  Endo/Other    Class 3 obesity (BMI 43)  Renal/GU negative Renal ROS  negative genitourinary   Musculoskeletal negative musculoskeletal ROS (+)    Abdominal  (+) + obese  Peds negative pediatric ROS (+)  Hematology  (+) Blood dyscrasia, anemia Hb 10.1, plt 165    Anesthesia Other Findings Zepbound  LD: 2 weeks ago  Reproductive/Obstetrics negative OB ROS                              Anesthesia Physical Anesthesia Plan  ASA: 4  Anesthesia Plan: General   Post-op Pain Management: Tylenol  PO (pre-op)*, Ketamine  IV*, Dilaudid  IV and Toradol  IV (intra-op)*   Induction: Intravenous  PONV Risk Score and Plan: 4 or greater and Ondansetron , Dexamethasone , Midazolam  and Treatment may vary due to age or medical condition  Airway Management Planned: Oral ETT  Additional Equipment: Arterial line  Intra-op Plan:   Post-operative Plan: Extubation in OR  Informed Consent: I have reviewed the patients History and Physical, chart, labs and discussed the procedure including the risks,  benefits and alternatives for the proposed anesthesia with the patient or authorized representative who has indicated his/her understanding and acceptance.     Dental advisory given  Plan Discussed with: CRNA  Anesthesia Plan Comments: (Last airway note for 2023 barostim placement: Induction Type: IV induction Ventilation: Mask ventilation without difficulty Laryngoscope Size: Mac and 4 Grade View: Grade I Tube type: Oral Tube size: 7.0 mm Number of attempts: 1  Arterial line for monitoring, pads for chest )          Anesthesia Quick Evaluation  "

## 2024-05-04 NOTE — Progress Notes (Signed)
 Heart Failure Navigator Progress Note  Assessed for Heart & Vascular TOC clinic readiness.  Patient does not meet criteria due to she is a patient with the Advanced Heart Failure Team , with Dr. Bensimhon. .   Navigator will sign off at this time.    Stephane Haddock, BSN, Scientist, Clinical (histocompatibility And Immunogenetics) Only

## 2024-05-04 NOTE — Anesthesia Procedure Notes (Addendum)
 Procedure Name: Intubation Date/Time: 05/04/2024 11:24 AM  Performed by: Mannie Krystal LABOR, CRNAPre-anesthesia Checklist: Patient identified, Emergency Drugs available, Suction available and Patient being monitored Patient Re-evaluated:Patient Re-evaluated prior to induction Oxygen Delivery Method: Circle system utilized Preoxygenation: Pre-oxygenation with 100% oxygen Induction Type: IV induction Ventilation: Mask ventilation without difficulty Laryngoscope Size: Miller and 2 Grade View: Grade I Tube type: Oral Tube size: 7.0 mm Number of attempts: 1 Airway Equipment and Method: Stylet and Oral airway Placement Confirmation: ETT inserted through vocal cords under direct vision, positive ETCO2 and breath sounds checked- equal and bilateral Secured at: 22 cm Tube secured with: Tape Dental Injury: Teeth and Oropharynx as per pre-operative assessment

## 2024-05-04 NOTE — Interval H&P Note (Signed)
 History and Physical Interval Note:  05/04/2024 10:29 AM Discussed lap chole today I discussed the procedure in detail.  We discussed the risks and benefits of a laparoscopic cholecystectomy and possible cholangiogram including, but not limited to bleeding, infection, injury to surrounding structures such as the intestine or liver, bile leak, retained gallstones, need to convert to an open procedure, prolonged diarrhea, blood clots such as  DVT, common bile duct injury, anesthesia risks, and possible need for additional procedures. We also discussed small risk of subtotal cholecystectomy.  The likelihood of improvement in symptoms and return to the patient's normal status is good. We discussed the typical post-operative recovery course.  Tara Bradshaw  has presented today for surgery, with the diagnosis of CHOLECYSTIS.  The various methods of treatment have been discussed with the patient and family. After consideration of risks, benefits and other options for treatment, the patient has consented to  Procedures: LAPAROSCOPIC CHOLECYSTECTOMY (N/A) INDOCYANINE GREEN  FLUORESCENCE IMAGING (ICG) (N/A) as a surgical intervention.  The patient's history has been reviewed, patient examined, no change in status, stable for surgery.  I have reviewed the patient's chart and labs.  Questions were answered to the patient's satisfaction.     Tara Bradshaw

## 2024-05-04 NOTE — Transfer of Care (Signed)
 Immediate Anesthesia Transfer of Care Note  Patient: Tara Bradshaw  Procedure(s) Performed: LAPAROSCOPIC CHOLECYSTECTOMY INDOCYANINE GREEN  FLUORESCENCE IMAGING (ICG)  Patient Location: PACU  Anesthesia Type:General  Level of Consciousness: awake, alert , and oriented  Airway & Oxygen Therapy: Patient Spontanous Breathing and Patient connected to face mask oxygen  Post-op Assessment: Report given to RN and Post -op Vital signs reviewed and stable  Post vital signs: Reviewed and stable  Last Vitals:  Vitals Value Taken Time  BP 131/103 05/04/24 13:30  Temp 36.7 C 05/04/24 13:22  Pulse 77 05/04/24 13:23  Resp 20 05/04/24 13:22  SpO2 100 % 05/04/24 13:23  Vitals shown include unfiled device data.  Last Pain:  Vitals:   05/04/24 1322  TempSrc: Oral  PainSc:          Complications: No notable events documented.

## 2024-05-04 NOTE — Progress Notes (Signed)
 "   Progress Note  Patient Name: Tara Bradshaw Date of Encounter: 05/04/2024  Primary Cardiologist:   None   Subjective   Status post cholecystectomy.  She has some SOB. Mild incisional abdominal pain.   Inpatient Medications    Scheduled Meds:  acetaminophen   1,000 mg Oral Once   gabapentin   300 mg Oral QHS   indocyanine green   2.5 mg Intravenous To SSTC   sacubitril -valsartan   1 tablet Oral BID   scopolamine   1 patch Transdermal Q72H   Continuous Infusions:  piperacillin -tazobactam (ZOSYN )  IV 3.375 g (05/04/24 0652)   PRN Meds: acetaminophen  **OR** acetaminophen , morphine  injection, ondansetron  (ZOFRAN ) IV, oxyCODONE    Vital Signs    Vitals:   05/03/24 1000 05/03/24 1156 05/04/24 0715 05/04/24 0807  BP: 106/82 110/81  102/80  Pulse: 75 74  78  Resp: 18 20  20   Temp:  97.9 F (36.6 C)  97.8 F (36.6 C)  TempSrc:  Oral  Oral  SpO2:  100%  100%  Weight:   109.7 kg   Height:        Intake/Output Summary (Last 24 hours) at 05/04/2024 0820 Last data filed at 05/04/2024 0800 Gross per 24 hour  Intake 50 ml  Output 650 ml  Net -600 ml   Filed Weights   05/02/24 2013 05/03/24 0355 05/04/24 0715  Weight: 110 kg 108 kg 109.7 kg    Telemetry    RRR, NSVT - Personally Reviewed  ECG    NA - Personally Reviewed  Physical Exam   GEN: No acute distress.   Neck: No  JVD Cardiac: RRR, no murmurs, rubs, or gallops.  Respiratory: Clear  to auscultation bilaterally. GI: Soft, nontender, non-distended  MS: No  edema; No deformity. Neuro:  Nonfocal  Psych: Normal affect   Labs    Chemistry Recent Labs  Lab 05/02/24 1454 05/03/24 0237 05/04/24 0044  NA 141 142 140  K 3.4* 3.2* 4.0  CL 103 105 104  CO2 26 27 28   GLUCOSE 92 110* 99  BUN 10 9 8   CREATININE 0.74 0.80 0.88  CALCIUM  9.7 8.7* 8.4*  PROT 6.7 5.7*  --   ALBUMIN 4.3 3.7  --   AST 35 29  --   ALT 47* 43  --   ALKPHOS 53 45  --   BILITOT 0.9 0.7  --   GFRNONAA >60 >60 >60  ANIONGAP 12 10  7      Hematology Recent Labs  Lab 05/02/24 1454 05/03/24 0237  WBC 6.5 5.7  RBC 4.08 3.67*  HGB 11.1* 10.1*  HCT 35.0* 30.9*  MCV 85.8 84.2  MCH 27.2 27.5  MCHC 31.7 32.7  RDW 16.0* 15.9*  PLT 165 165    Cardiac EnzymesNo results for input(s): TROPONINI in the last 168 hours. No results for input(s): TROPIPOC in the last 168 hours.   BNP Recent Labs  Lab 05/02/24 1454  PROBNP 3,347.0*     DDimer No results for input(s): DDIMER in the last 168 hours.   Radiology    ECHOCARDIOGRAM COMPLETE Result Date: 05/03/2024    ECHOCARDIOGRAM REPORT   Patient Name:   Tara Bradshaw Date of Exam: 05/03/2024 Medical Rec #:  [id number]      Height:       62.0 in Accession #:    [id number]     Weight:       238.2 lb Date of Birth:  [birth year]      BSA:  2.059 m Patient Age:    38 years       BP:           110/81 mmHg Patient Gender: F              HR:           84 bpm. Exam Location:  Inpatient Procedure: 2D Echo (Both Spectral and Color Flow Doppler were utilized during            procedure). Indications:    nonischemic cardiomyopathy  History:        Patient has prior history of Echocardiogram examinations, most                 recent 07/29/2023. Cardiomyopathy; Defibrillator.  Sonographer:    Tinnie Barefoot RDCS Referring Phys: [id number] KELLY A GRIFFITH IMPRESSIONS  1. Global hypokinesis with akinesis of the inferior wall. Left ventricular ejection fraction, by estimation, is <20%. The left ventricle has severely decreased function. The left ventricle demonstrates global hypokinesis. The left ventricular internal cavity size was mildly to moderately dilated. Left ventricular diastolic parameters are consistent with Grade II diastolic dysfunction (pseudonormalization).  2. Right ventricular systolic function is moderately reduced. The right ventricular size is normal. There is severely elevated pulmonary artery systolic pressure. The estimated right ventricular systolic pressure is 65.7  mmHg.  3. Left atrial size was severely dilated.  4. Right atrial size was moderately dilated.  5. The mitral valve is normal in structure. Moderate to severe mitral valve regurgitation. No evidence of mitral stenosis.  6. Tricuspid valve regurgitation is moderate.  7. The aortic valve is normal in structure. Aortic valve regurgitation is not visualized. No aortic stenosis is present.  8. The inferior vena cava is normal in size with greater than 50% respiratory variability, suggesting right atrial pressure of 3 mmHg. FINDINGS  Left Ventricle: Global hypokinesis with akinesis of the inferior wall. Left ventricular ejection fraction, by estimation, is <20%. The left ventricle has severely decreased function. The left ventricle demonstrates global hypokinesis. The left ventricular internal cavity size was mildly to moderately dilated. There is no left ventricular hypertrophy. Left ventricular diastolic parameters are consistent with Grade II diastolic dysfunction (pseudonormalization). Right Ventricle: The right ventricular size is normal. No increase in right ventricular wall thickness. Right ventricular systolic function is moderately reduced. There is severely elevated pulmonary artery systolic pressure. The tricuspid regurgitant velocity is 3.56 m/s, and with an assumed right atrial pressure of 15 mmHg, the estimated right ventricular systolic pressure is 65.7 mmHg. Left Atrium: Left atrial size was severely dilated. Right Atrium: Right atrial size was moderately dilated. Pericardium: There is no evidence of pericardial effusion. Mitral Valve: The mitral valve is normal in structure. Moderate to severe mitral valve regurgitation, with posteriorly-directed jet. No evidence of mitral valve stenosis. Tricuspid Valve: The tricuspid valve is normal in structure. Tricuspid valve regurgitation is moderate . No evidence of tricuspid stenosis. Aortic Valve: The aortic valve is normal in structure. Aortic valve  regurgitation is not visualized. No aortic stenosis is present. Pulmonic Valve: The pulmonic valve was normal in structure. Pulmonic valve regurgitation is mild. No evidence of pulmonic stenosis. Aorta: The aortic root is normal in size and structure. Venous: The inferior vena cava is normal in size with greater than 50% respiratory variability, suggesting right atrial pressure of 3 mmHg. IAS/Shunts: No atrial level shunt detected by color flow Doppler. Additional Comments: A device lead is visualized in the right ventricle.  LEFT VENTRICLE PLAX 2D  LVIDd:         6.20 cm      Diastology LVIDs:         6.00 cm      LV e' medial:    4.24 cm/s LV PW:         1.10 cm      LV E/e' medial:  19.2 LV IVS:        1.00 cm      LV e' lateral:   7.62 cm/s LVOT diam:     2.30 cm      LV E/e' lateral: 10.7 LV SV:         32 LV SV Index:   15 LVOT Area:     4.15 cm LV IVRT:       77 msec  LV Volumes (MOD) LV vol d, MOD A2C: 193.0 ml LV vol d, MOD A4C: 168.0 ml LV vol s, MOD A2C: 144.0 ml LV vol s, MOD A4C: 123.0 ml LV SV MOD A2C:     49.0 ml LV SV MOD A4C:     168.0 ml LV SV MOD BP:      47.5 ml RIGHT VENTRICLE             IVC RV Basal diam:  3.65 cm     IVC diam: 2.40 cm RV S prime:     12.00 cm/s TAPSE (M-mode): 2.4 cm LEFT ATRIUM              Index        RIGHT ATRIUM           Index LA diam:        5.50 cm  2.67 cm/m   RA Area:     23.20 cm LA Vol (A2C):   81.5 ml  39.58 ml/m  RA Volume:   77.50 ml  37.64 ml/m LA Vol (A4C):   102.0 ml 49.54 ml/m LA Biplane Vol: 98.3 ml  47.74 ml/m  AORTIC VALVE LVOT Vmax:   54.80 cm/s LVOT Vmean:  34.500 cm/s LVOT VTI:    0.076 m  AORTA Ao Root diam: 3.00 cm Ao Asc diam:  2.90 cm MITRAL VALVE               TRICUSPID VALVE MV Area (PHT): 7.16 cm    TR Peak grad:   50.7 mmHg MV Decel Time: 106 msec    TR Vmax:        356.00 cm/s MR PISA:        2.26 cm MR PISA Radius: 0.60 cm    SHUNTS MV E velocity: 81.20 cm/s  Systemic VTI:  0.08 m MV A velocity: 37.90 cm/s  Systemic Diam: 2.30 cm MV  E/A ratio:  2.14 Toribio Fuel MD Electronically signed by Toribio Fuel MD Signature Date/Time: 05/03/2024/10:50:56 PM    Final    US  Abdomen Limited RUQ (LIVER/GB) Result Date: 05/02/2024 EXAM: Right Upper Quadrant Abdominal Ultrasound 05/02/2024 05:01:00 PM TECHNIQUE: Real-time ultrasonography of the right upper quadrant of the abdomen was performed. COMPARISON: CT abdomen and pelvis 05/02/2024. CLINICAL HISTORY: Upper abdominal pain. FINDINGS: LIVER: Normal echogenicity. Circumscribed focal hyperechoic liver lesions are demonstrated with the largest in the left lobe measuring 2 cm in maximal diameter, and the right lobe measuring 1.1 cm. The appearance is consistent with cavernous hemangiomas. No intrahepatic biliary ductal dilatation. Hepatopetal flow in the portal vein. BILIARY SYSTEM: Cholelithiasis with multiple stones in the gallbladder. Gallbladder wall thickening to 0.7 cm. Pericholecystic edema.  Murphy's sign is positive. Changes are consistent with acute cholecystitis in the appropriate clinical setting. The Common Bile Duct measures 0.3 cm. No bile duct dilatation. RIGHT KIDNEY: No hydronephrosis. No echogenic calculi. No mass. PANCREAS: Visualized portions of the pancreas are unremarkable. OTHER: No right upper quadrant ascites. IMPRESSION: 1. Acute cholecystitis with cholelithiasis. 2. No bile duct dilatation. Electronically signed by: Elsie Gravely MD 05/02/2024 05:09 PM EST RP Workstation: [id number]   CT ABDOMEN PELVIS W CONTRAST Result Date: 05/02/2024 EXAM: CT ABDOMEN AND PELVIS WITH CONTRAST 05/02/2024 03:51:04 PM TECHNIQUE: CT of the abdomen and pelvis was performed with the administration of 100 mL of iohexol  (OMNIPAQUE ) 300 MG/ML solution. Multiplanar reformatted images are provided for review. Automated exposure control, iterative reconstruction, and/or weight-based adjustment of the mA/kV was utilized to reduce the radiation dose to as low as reasonably achievable. COMPARISON:  Comparison with 07/18/2020. CLINICAL HISTORY: Upper abdominal pain, nausea, constipation, congestive heart failure. FINDINGS: LOWER CHEST: Trace bilateral pleural effusions. Patchy ground-glass infiltrates in the lung bases may represent edema or pneumonia. Cardiac enlargement. LIVER: Mild diffuse fatty infiltration of the liver. No focal liver lesions. GALLBLADDER AND BILE DUCTS: Cholelithiasis with gallbladder wall thickening and pericholecystic edema. Changes likely to indicate acute cholecystitis in the appropriate clinical setting. No bile duct dilatation. SPLEEN: The spleen is normal. PANCREAS: Pancreas is unremarkable. ADRENAL GLANDS: Adrenal glands are unremarkable. KIDNEYS, URETERS AND BLADDER: Kidneys, ureters, and the bladder are unremarkable. No stones in the kidneys or ureters. No hydronephrosis. No perinephric or periureteral stranding. Urinary bladder is unremarkable. GI AND BOWEL: Stomach, small bowel, and colon are not abnormally distended. No wall thickening or inflammatory stranding identified. Appendix is normal. There is no bowel obstruction. PERITONEUM AND RETROPERITONEUM: A small amount of free fluid in the pelvis could be reactive or physiologic. Small amount of free fluid in the right paracolic gutter is likely reactive. No free air. VASCULATURE: Normal caliber abdominal aorta. No dissection. LYMPH NODES: No significant lymphadenopathy. REPRODUCTIVE ORGANS: The uterus and ovaries are not significantly enlarged. Nodular enhancement in the uterus consistent with fibroid. An intrauterine device is present with central positioning. BONES AND SOFT TISSUES: No acute bony abnormalities. No focal soft tissue abnormality. IMPRESSION: 1. Cholelithiasis with gallbladder wall thickening and pericholecystic edema, consistent with acute cholecystitis. No bile duct dilatation. 2. Trace bilateral pleural effusions with patchy ground-glass opacities in the lung bases, which may reflect edema or pneumonia. 3.  Cardiomegaly. Electronically signed by: Elsie Gravely MD 05/02/2024 03:59 PM EST RP Workstation: [id number]   DG Chest 2 View Result Date: 05/02/2024 EXAM: 2 VIEW(S) XRAY OF THE CHEST 05/02/2024 02:47:21 PM COMPARISON: None available. CLINICAL HISTORY: Shortness of breath. FINDINGS: LINES, TUBES AND DEVICES: Neurostimulator device battery pack overlies right chest. Left chest AICD in place. LUNGS AND PLEURA: Indistinct pulmonary vasculature favoring pulmonary venous hypertension. No overt airspace edema. No blunting of the costophrenic angles. No focal pulmonary opacity. No pleural effusion. No pneumothorax. Lateral view blurred by motion artifact. HEART AND MEDIASTINUM: Cardiomegaly. BONES AND SOFT TISSUES: No acute osseous abnormality. IMPRESSION: 1. Indistinct pulmonary vasculature favoring pulmonary venous hypertension. No overt airspace edema or blunting of the costophrenic angles. 2. Cardiomegaly. 3. Left chest AICD and right chest neurostimulator device battery pack. Electronically signed by: Ryan Salvage MD 05/02/2024 03:10 PM EST RP Workstation: [id number]    Cardiac Studies   Echo:     05/02/24  1. Global hypokinesis with akinesis of the inferior wall. Left  ventricular ejection fraction, by estimation, is <20%. The left ventricle  has severely decreased function. The left ventricle demonstrates global  hypokinesis. The left ventricular internal  cavity size was mildly to moderately dilated. Left ventricular diastolic  parameters are consistent with Grade II diastolic dysfunction  (pseudonormalization).   2. Right ventricular systolic function is moderately reduced. The right  ventricular size is normal. There is severely elevated pulmonary artery  systolic pressure. The estimated right ventricular systolic pressure is  65.7 mmHg.   3. Left atrial size was severely dilated.   4. Right atrial size was moderately dilated.   5. The mitral valve is normal in structure. Moderate to  severe mitral  valve regurgitation. No evidence of mitral stenosis.   6. Tricuspid valve regurgitation is moderate.   7. The aortic valve is normal in structure. Aortic valve regurgitation is  not visualized. No aortic stenosis is present.   8. The inferior vena cava is normal in size with greater than 50%  respiratory variability, suggesting right atrial pressure of 3 mmHg.   Patient Profile     39 y.o. female with a hx of nonischemic cardiomyopathy s/p St Jude SICD, HTN and systolic heart failure diagnosed in 06/27/18 w/ LVEF 20-25% , and s/p barostim 10/23.   Assessment & Plan    Chronic HF with reduced EF:    Net negative 1163.  Given one dose of IV Lasix .     BP was low last evening, early AM but should tolerate resumption of Entresto  with PM dose today.     Post cholecystectomy:   Doing well post op.  Continue as above.  Pain management per surgery and primary team.   For questions or updates, please contact [facility] Please consult [email or web address] for contact info under Cardiology/STEMI.   Signed, Lynwood Schilling, MD  05/04/2024, 8:20 AM    "

## 2024-05-04 NOTE — TOC CM/SW Note (Signed)
 Transition of Care [facility]) - Inpatient Brief Assessment   Patient Details  Name: Dietra Stokely MRN: [id number] Date of Birth: 1985-11-29  [facility] of [facility] CM/SW Contact:    Luise JAYSON Pan, LCSWA Phone Number: 05/04/2024, 1:32 PM   Clinical Narrative: Patient is from home. Patient has PCP and insurance on file. Patient has no prior HH/SNF hx. Per chart review, patient received a cpap from [employer] back in 2020 and follow up in 2024. Patient uses [facility], community on [employer] in [place].  Inpatient Care Management will continue to monitor and follow as needed.  Transition of Care Asessment: Insurance and Status: Insurance coverage has been reviewed Patient has primary care physician: Yes Home environment has been reviewed: Home Prior level of function:: Independent Prior/Current Home Services: No current home services Social Drivers of Health Review: SDOH reviewed no interventions necessary Readmission risk has been reviewed: Yes Transition of care needs: no transition of care needs at this time

## 2024-05-05 ENCOUNTER — Ambulatory Visit (HOSPITAL_COMMUNITY): Admitting: Internal Medicine

## 2024-05-05 ENCOUNTER — Encounter: Payer: Self-pay | Admitting: *Deleted

## 2024-05-05 ENCOUNTER — Other Ambulatory Visit (HOSPITAL_COMMUNITY): Payer: Self-pay

## 2024-05-05 ENCOUNTER — Ambulatory Visit: Payer: Self-pay | Admitting: Cardiovascular Disease

## 2024-05-05 ENCOUNTER — Encounter (HOSPITAL_COMMUNITY): Payer: Self-pay | Admitting: General Surgery

## 2024-05-05 DIAGNOSIS — I5023 Acute on chronic systolic (congestive) heart failure: Secondary | ICD-10-CM

## 2024-05-05 DIAGNOSIS — K810 Acute cholecystitis: Secondary | ICD-10-CM | POA: Diagnosis not present

## 2024-05-05 DIAGNOSIS — Z006 Encounter for examination for normal comparison and control in clinical research program: Secondary | ICD-10-CM

## 2024-05-05 DIAGNOSIS — I10 Essential (primary) hypertension: Secondary | ICD-10-CM | POA: Diagnosis not present

## 2024-05-05 DIAGNOSIS — I5043 Acute on chronic combined systolic (congestive) and diastolic (congestive) heart failure: Secondary | ICD-10-CM | POA: Insufficient documentation

## 2024-05-05 DIAGNOSIS — I428 Other cardiomyopathies: Secondary | ICD-10-CM | POA: Diagnosis not present

## 2024-05-05 LAB — CUP PACEART REMOTE DEVICE CHECK
Battery Remaining Longevity: 58 mo
Battery Remaining Percentage: 53 %
Battery Voltage: 2.96 V
Brady Statistic RV Percent Paced: 1 %
Date Time Interrogation Session: 20260126023345
HighPow Impedance: 50 Ohm
Implantable Lead Connection Status: 753985
Implantable Lead Implant Date: 20210107
Implantable Lead Location: 753860
Implantable Pulse Generator Implant Date: 20201112
Lead Channel Impedance Value: 310 Ohm
Lead Channel Pacing Threshold Amplitude: 1.75 V
Lead Channel Pacing Threshold Pulse Width: 0.5 ms
Lead Channel Sensing Intrinsic Amplitude: 9.1 mV
Lead Channel Setting Pacing Amplitude: 3.5 V
Lead Channel Setting Pacing Pulse Width: 0.5 ms
Lead Channel Setting Sensing Sensitivity: 0.5 mV
Pulse Gen Serial Number: 111012702
Zone Setting Status: 755011

## 2024-05-05 LAB — SURGICAL PATHOLOGY

## 2024-05-05 LAB — CBC
HCT: 34.8 % — ABNORMAL LOW (ref 36.0–46.0)
Hemoglobin: 11.4 g/dL — ABNORMAL LOW (ref 12.0–15.0)
MCH: 27.6 pg (ref 26.0–34.0)
MCHC: 32.8 g/dL (ref 30.0–36.0)
MCV: 84.3 fL (ref 80.0–100.0)
Platelets: 183 10*3/uL (ref 150–400)
RBC: 4.13 MIL/uL (ref 3.87–5.11)
RDW: 15.9 % — ABNORMAL HIGH (ref 11.5–15.5)
WBC: 8.6 10*3/uL (ref 4.0–10.5)
nRBC: 0 % (ref 0.0–0.2)

## 2024-05-05 LAB — COMPREHENSIVE METABOLIC PANEL WITH GFR
ALT: 43 U/L (ref 0–44)
AST: 34 U/L (ref 15–41)
Albumin: 3.7 g/dL (ref 3.5–5.0)
Alkaline Phosphatase: 47 U/L (ref 38–126)
Anion gap: 9 (ref 5–15)
BUN: 8 mg/dL (ref 6–20)
CO2: 26 mmol/L (ref 22–32)
Calcium: 8.9 mg/dL (ref 8.9–10.3)
Chloride: 102 mmol/L (ref 98–111)
Creatinine, Ser: 0.71 mg/dL (ref 0.44–1.00)
GFR, Estimated: >60 mL/min
Glucose, Bld: 118 mg/dL — ABNORMAL HIGH (ref 70–99)
Potassium: 4.3 mmol/L (ref 3.5–5.1)
Sodium: 138 mmol/L (ref 135–145)
Total Bilirubin: 0.6 mg/dL (ref 0.0–1.2)
Total Protein: 6 g/dL — ABNORMAL LOW (ref 6.5–8.1)

## 2024-05-05 MED ORDER — MAGNESIUM SULFATE 2 GM/50ML IV SOLN
2.0000 g | Freq: Once | INTRAVENOUS | Status: AC
  Administered 2024-05-05: 2 g via INTRAVENOUS
  Filled 2024-05-05: qty 50

## 2024-05-05 MED ORDER — FUROSEMIDE 40 MG PO TABS: 80.0000 mg | ORAL_TABLET | Freq: Two times a day (BID) | ORAL | Status: DC

## 2024-05-05 MED ORDER — DOCUSATE SODIUM 100 MG PO CAPS
100.0000 mg | ORAL_CAPSULE | Freq: Two times a day (BID) | ORAL | Status: DC
  Administered 2024-05-05 – 2024-05-08 (×7): 100 mg via ORAL
  Filled 2024-05-05 (×7): qty 1

## 2024-05-05 MED ORDER — PANTOPRAZOLE SODIUM 40 MG PO TBEC
40.0000 mg | DELAYED_RELEASE_TABLET | Freq: Every day | ORAL | Status: DC
  Administered 2024-05-05 – 2024-05-08 (×4): 40 mg via ORAL
  Filled 2024-05-05 (×4): qty 1

## 2024-05-05 MED ORDER — OXYCODONE HCL 5 MG PO TABS
5.0000 mg | ORAL_TABLET | ORAL | 0 refills | Status: AC | PRN
  Filled 2024-05-05: qty 10, 2d supply, fill #0

## 2024-05-05 MED ORDER — FUROSEMIDE 40 MG PO TABS
80.0000 mg | ORAL_TABLET | Freq: Every day | ORAL | Status: DC
  Administered 2024-05-06 – 2024-05-07 (×2): 80 mg via ORAL
  Filled 2024-05-05 (×4): qty 2

## 2024-05-05 MED ORDER — MECLIZINE HCL 25 MG PO TABS
25.0000 mg | ORAL_TABLET | Freq: Three times a day (TID) | ORAL | Status: DC | PRN
  Administered 2024-05-05 – 2024-05-07 (×3): 25 mg via ORAL
  Filled 2024-05-05 (×3): qty 1

## 2024-05-05 NOTE — Progress Notes (Signed)
 1 Day Post-Op   Subjective/Chief Complaint: Doing well, tol diet   Objective: Vital signs in last 24 hours: Temp:  [97.5 F (36.4 C)-98.9 F (37.2 C)] 98 F (36.7 C) (01/27 0426) Pulse Rate:  [75-89] 75 (01/27 0426) Resp:  [15-24] 18 (01/27 0426) BP: (92-131)/(66-103) 103/66 (01/27 0426) SpO2:  [93 %-100 %] 98 % (01/27 0426) Weight:  [106.1 kg-111.6 kg] 111.6 kg (01/27 0426) Last BM Date : 05/02/24  Intake/Output from previous day: 01/26 0701 - 01/27 0700 In: 0  Out: 400 [Urine:400] Intake/Output this shift: No intake/output data recorded.  Ab soft approp tender incisions clean  Lab Results:  Recent Labs    05/03/24 0237 05/05/24 0246  WBC 5.7 8.6  HGB 10.1* 11.4*  HCT 30.9* 34.8*  PLT 165 183   BMET Recent Labs    05/04/24 0044 05/05/24 0246  NA 140 138  K 4.0 4.3  CL 104 102  CO2 28 26  GLUCOSE 99 118*  BUN 8 8  CREATININE 0.88 0.71  CALCIUM  8.4* 8.9   PT/INR Recent Labs    05/03/24 0237  LABPROT 15.7*  INR 1.2   ABG No results for input(s): PHART, HCO3 in the last 72 hours.  Invalid input(s): PCO2, PO2  Studies/Results: ECHOCARDIOGRAM COMPLETE Result Date: 05/03/2024    ECHOCARDIOGRAM REPORT   Patient Name:   Tara Bradshaw Date of Exam: 05/03/2024 Medical Rec #:  [id number]      Height:       62.0 in Accession #:    [id number]     Weight:       238.2 lb Date of Birth:  02-03-1986      BSA:          2.059 m Patient Age:    38 years       BP:           110/81 mmHg Patient Gender: F              HR:           84 bpm. Exam Location:  Inpatient Procedure: 2D Echo (Both Spectral and Color Flow Doppler were utilized during            procedure). Indications:    nonischemic cardiomyopathy  History:        Patient has prior history of Echocardiogram examinations, most                 recent 07/29/2023. Cardiomyopathy; Defibrillator.  Sonographer:    Tinnie Barefoot RDCS Referring Phys: [id number] KELLY A GRIFFITH IMPRESSIONS  1. Global hypokinesis with  akinesis of the inferior wall. Left ventricular ejection fraction, by estimation, is <20%. The left ventricle has severely decreased function. The left ventricle demonstrates global hypokinesis. The left ventricular internal cavity size was mildly to moderately dilated. Left ventricular diastolic parameters are consistent with Grade II diastolic dysfunction (pseudonormalization).  2. Right ventricular systolic function is moderately reduced. The right ventricular size is normal. There is severely elevated pulmonary artery systolic pressure. The estimated right ventricular systolic pressure is 65.7 mmHg.  3. Left atrial size was severely dilated.  4. Right atrial size was moderately dilated.  5. The mitral valve is normal in structure. Moderate to severe mitral valve regurgitation. No evidence of mitral stenosis.  6. Tricuspid valve regurgitation is moderate.  7. The aortic valve is normal in structure. Aortic valve regurgitation is not visualized. No aortic stenosis is present.  8. The inferior vena cava is normal in size with  greater than 50% respiratory variability, suggesting right atrial pressure of 3 mmHg. FINDINGS  Left Ventricle: Global hypokinesis with akinesis of the inferior wall. Left ventricular ejection fraction, by estimation, is <20%. The left ventricle has severely decreased function. The left ventricle demonstrates global hypokinesis. The left ventricular internal cavity size was mildly to moderately dilated. There is no left ventricular hypertrophy. Left ventricular diastolic parameters are consistent with Grade II diastolic dysfunction (pseudonormalization). Right Ventricle: The right ventricular size is normal. No increase in right ventricular wall thickness. Right ventricular systolic function is moderately reduced. There is severely elevated pulmonary artery systolic pressure. The tricuspid regurgitant velocity is 3.56 m/s, and with an assumed right atrial pressure of 15 mmHg, the estimated  right ventricular systolic pressure is 65.7 mmHg. Left Atrium: Left atrial size was severely dilated. Right Atrium: Right atrial size was moderately dilated. Pericardium: There is no evidence of pericardial effusion. Mitral Valve: The mitral valve is normal in structure. Moderate to severe mitral valve regurgitation, with posteriorly-directed jet. No evidence of mitral valve stenosis. Tricuspid Valve: The tricuspid valve is normal in structure. Tricuspid valve regurgitation is moderate . No evidence of tricuspid stenosis. Aortic Valve: The aortic valve is normal in structure. Aortic valve regurgitation is not visualized. No aortic stenosis is present. Pulmonic Valve: The pulmonic valve was normal in structure. Pulmonic valve regurgitation is mild. No evidence of pulmonic stenosis. Aorta: The aortic root is normal in size and structure. Venous: The inferior vena cava is normal in size with greater than 50% respiratory variability, suggesting right atrial pressure of 3 mmHg. IAS/Shunts: No atrial level shunt detected by color flow Doppler. Additional Comments: A device lead is visualized in the right ventricle.  LEFT VENTRICLE PLAX 2D LVIDd:         6.20 cm      Diastology LVIDs:         6.00 cm      LV e' medial:    4.24 cm/s LV PW:         1.10 cm      LV E/e' medial:  19.2 LV IVS:        1.00 cm      LV e' lateral:   7.62 cm/s LVOT diam:     2.30 cm      LV E/e' lateral: 10.7 LV SV:         32 LV SV Index:   15 LVOT Area:     4.15 cm LV IVRT:       77 msec  LV Volumes (MOD) LV vol d, MOD A2C: 193.0 ml LV vol d, MOD A4C: 168.0 ml LV vol s, MOD A2C: 144.0 ml LV vol s, MOD A4C: 123.0 ml LV SV MOD A2C:     49.0 ml LV SV MOD A4C:     168.0 ml LV SV MOD BP:      47.5 ml RIGHT VENTRICLE             IVC RV Basal diam:  3.65 cm     IVC diam: 2.40 cm RV S prime:     12.00 cm/s TAPSE (M-mode): 2.4 cm LEFT ATRIUM              Index        RIGHT ATRIUM           Index LA diam:        5.50 cm  2.67 cm/m   RA Area:     23.20  cm  LA Vol (A2C):   81.5 ml  39.58 ml/m  RA Volume:   77.50 ml  37.64 ml/m LA Vol (A4C):   102.0 ml 49.54 ml/m LA Biplane Vol: 98.3 ml  47.74 ml/m  AORTIC VALVE LVOT Vmax:   54.80 cm/s LVOT Vmean:  34.500 cm/s LVOT VTI:    0.076 m  AORTA Ao Root diam: 3.00 cm Ao Asc diam:  2.90 cm MITRAL VALVE               TRICUSPID VALVE MV Area (PHT): 7.16 cm    TR Peak grad:   50.7 mmHg MV Decel Time: 106 msec    TR Vmax:        356.00 cm/s MR PISA:        2.26 cm MR PISA Radius: 0.60 cm    SHUNTS MV E velocity: 81.20 cm/s  Systemic VTI:  0.08 m MV A velocity: 37.90 cm/s  Systemic Diam: 2.30 cm MV E/A ratio:  2.14 Toribio Fuel MD Electronically signed by Toribio Fuel MD Signature Date/Time: 05/03/2024/10:50:56 PM    Final     Anti-infectives: Anti-infectives (From admission, onward)    Start     Dose/Rate Route Frequency Ordered Stop   05/02/24 2330  piperacillin -tazobactam (ZOSYN ) IVPB 3.375 g  Status:  Discontinued        3.375 g 12.5 mL/hr over 240 Minutes Intravenous Every 8 hours 05/02/24 2210 05/04/24 1346   05/02/24 1730  piperacillin -tazobactam (ZOSYN ) IVPB 3.375 g        3.375 g 12.5 mL/hr over 240 Minutes Intravenous  Once 05/02/24 1726 05/02/24 2204   05/02/24 1715  cefTRIAXone  (ROCEPHIN ) 2 g in sodium chloride  0.9 % 100 mL IVPB  Status:  Discontinued        2 g 200 mL/hr over 30 Minutes Intravenous  Once 05/02/24 1713 05/02/24 1726       Assessment/Plan: POD 1 lap chole -can dc home when ready medically -will setup follow up   Tara Bradshaw 05/05/2024

## 2024-05-05 NOTE — Progress Notes (Signed)
 "  Rounding Note   Patient Name: Tara Bradshaw Date of Encounter: 05/05/2024  [facility] Cardiologist: Toribio Fuel, MD   Subjective Feeling a little SOB.  Drinking water.  Not eating.  +abdominal discomfort.  Scheduled Meds:  docusate sodium   100 mg Oral BID   gabapentin   300 mg Oral QHS   sacubitril -valsartan   1 tablet Oral BID   Continuous Infusions:  PRN Meds: acetaminophen  **OR** acetaminophen , morphine  injection, ondansetron  (ZOFRAN ) IV, oxyCODONE    Vital Signs  Vitals:   05/05/24 0426 05/05/24 0746 05/05/24 0848 05/05/24 0904  BP: 103/66 119/82    Pulse: 75 70 77   Resp: 18 (!) 24    Temp: 98 F (36.7 C) 97.6 F (36.4 C)    TempSrc: Oral Oral    SpO2: 98% 99% 100% 98%  Weight: 111.6 kg     Height:        Intake/Output Summary (Last 24 hours) at 05/05/2024 1017 Last data filed at 05/05/2024 0429 Gross per 24 hour  Intake --  Output 200 ml  Net -200 ml      05/05/2024    4:26 AM 05/04/2024    9:10 AM 05/04/2024    7:15 AM  Last 3 Weights  Weight (lbs) 246 lb 0.5 oz 234 lb 241 lb 13.5 oz  Weight (kg) 111.6 kg 106.142 kg 109.7 kg      Telemetry Sinus rhythm.  Frequent PVCs - Personally Reviewed  ECG  N/a - Personally Reviewed  Physical Exam  VS:  BP 119/82 (BP Location: Right Arm)   Pulse 77   Temp 97.6 F (36.4 C) (Oral)   Resp (!) 24   Ht 5' 2 (1.575 m)   Wt 111.6 kg   SpO2 98%   BMI 45.00 kg/m  , BMI Body mass index is 45 kg/m. GENERAL:  Well appearing HEENT: Pupils equal round and reactive, fundi not visualized, oral mucosa unremarkable NECK:  + jugular venous distention, waveform within normal limits, carotid upstroke brisk and symmetric, no bruits, no thyromegaly LUNGS:  Clear to auscultation bilaterally HEART:  RRR.  PMI not displaced or sustained,S1 and S2 within normal limits, no S3, no S4, no clicks, no rubs, no murmurs ABD:  Flat, positive bowel sounds normal in frequency in pitch, no bruits, no rebound, no  guarding, no midline pulsatile mass, no hepatomegaly, no splenomegaly EXT:  2 plus pulses throughout, no edema, no cyanosis no clubbing SKIN:  No rashes no nodules NEURO:  Cranial nerves II through XII grossly intact, motor grossly intact throughout PSYCH:  Cognitively intact, oriented to person place and time  Labs High Sensitivity Troponin:  No results for input(s): TROPONINIHS in the last 720 hours.  Recent Labs  Lab 05/02/24 1454  TRNPT 6       Chemistry Recent Labs  Lab 05/02/24 1454 05/02/24 1521 05/03/24 0237 05/04/24 0044 05/05/24 0246  NA 141  --  142 140 138  K 3.4*  --  3.2* 4.0 4.3  CL 103  --  105 104 102  CO2 26  --  27 28 26   GLUCOSE 92  --  110* 99 118*  BUN 10  --  9 8 8   CREATININE 0.74  --  0.80 0.88 0.71  CALCIUM  9.7  --  8.7* 8.4* 8.9  MG  --  1.7  --   --   --   PROT 6.7  --  5.7*  --  6.0*  ALBUMIN 4.3  --  3.7  --  3.7  AST 35  --  29  --  34  ALT 47*  --  43  --  43  ALKPHOS 53  --  45  --  47  BILITOT 0.9  --  0.7  --  0.6  GFRNONAA >60  --  >60 >60 >60  ANIONGAP 12  --  10 7 9     Lipids No results for input(s): CHOL, TRIG, HDL, LABVLDL, LDLCALC, CHOLHDL in the last 168 hours.  Hematology Recent Labs  Lab 05/02/24 1454 05/03/24 0237 05/05/24 0246  WBC 6.5 5.7 8.6  RBC 4.08 3.67* 4.13  HGB 11.1* 10.1* 11.4*  HCT 35.0* 30.9* 34.8*  MCV 85.8 84.2 84.3  MCH 27.2 27.5 27.6  MCHC 31.7 32.7 32.8  RDW 16.0* 15.9* 15.9*  PLT 165 165 183   Thyroid  No results for input(s): TSH, FREET4 in the last 168 hours.  BNP Recent Labs  Lab 05/02/24 1454  PROBNP 3,347.0*    DDimer No results for input(s): DDIMER in the last 168 hours.   Radiology  ECHOCARDIOGRAM COMPLETE Result Date: 05/03/2024    ECHOCARDIOGRAM REPORT   Patient Name:   Tara Bradshaw Date of Exam: 05/03/2024 Medical Rec #:  [id number]      Height:       62.0 in Accession #:    [id number]     Weight:       238.2 lb Date of Birth:  [birth year]      BSA:           2.059 m Patient Age:    38 years       BP:           110/81 mmHg Patient Gender: F              HR:           84 bpm. Exam Location:  Inpatient Procedure: 2D Echo (Both Spectral and Color Flow Doppler were utilized during            procedure). Indications:    nonischemic cardiomyopathy  History:        Patient has prior history of Echocardiogram examinations, most                 recent 07/29/2023. Cardiomyopathy; Defibrillator.  Sonographer:    Tinnie Barefoot RDCS Referring Phys: [id number] KELLY A GRIFFITH IMPRESSIONS  1. Global hypokinesis with akinesis of the inferior wall. Left ventricular ejection fraction, by estimation, is <20%. The left ventricle has severely decreased function. The left ventricle demonstrates global hypokinesis. The left ventricular internal cavity size was mildly to moderately dilated. Left ventricular diastolic parameters are consistent with Grade II diastolic dysfunction (pseudonormalization).  2. Right ventricular systolic function is moderately reduced. The right ventricular size is normal. There is severely elevated pulmonary artery systolic pressure. The estimated right ventricular systolic pressure is 65.7 mmHg.  3. Left atrial size was severely dilated.  4. Right atrial size was moderately dilated.  5. The mitral valve is normal in structure. Moderate to severe mitral valve regurgitation. No evidence of mitral stenosis.  6. Tricuspid valve regurgitation is moderate.  7. The aortic valve is normal in structure. Aortic valve regurgitation is not visualized. No aortic stenosis is present.  8. The inferior vena cava is normal in size with greater than 50% respiratory variability, suggesting right atrial pressure of 3 mmHg. FINDINGS  Left Ventricle: Global hypokinesis with akinesis of the inferior wall. Left ventricular ejection fraction, by estimation, is <20%. The left  ventricle has severely decreased function. The left ventricle demonstrates global hypokinesis. The left ventricular  internal cavity size was mildly to moderately dilated. There is no left ventricular hypertrophy. Left ventricular diastolic parameters are consistent with Grade II diastolic dysfunction (pseudonormalization). Right Ventricle: The right ventricular size is normal. No increase in right ventricular wall thickness. Right ventricular systolic function is moderately reduced. There is severely elevated pulmonary artery systolic pressure. The tricuspid regurgitant velocity is 3.56 m/s, and with an assumed right atrial pressure of 15 mmHg, the estimated right ventricular systolic pressure is 65.7 mmHg. Left Atrium: Left atrial size was severely dilated. Right Atrium: Right atrial size was moderately dilated. Pericardium: There is no evidence of pericardial effusion. Mitral Valve: The mitral valve is normal in structure. Moderate to severe mitral valve regurgitation, with posteriorly-directed jet. No evidence of mitral valve stenosis. Tricuspid Valve: The tricuspid valve is normal in structure. Tricuspid valve regurgitation is moderate . No evidence of tricuspid stenosis. Aortic Valve: The aortic valve is normal in structure. Aortic valve regurgitation is not visualized. No aortic stenosis is present. Pulmonic Valve: The pulmonic valve was normal in structure. Pulmonic valve regurgitation is mild. No evidence of pulmonic stenosis. Aorta: The aortic root is normal in size and structure. Venous: The inferior vena cava is normal in size with greater than 50% respiratory variability, suggesting right atrial pressure of 3 mmHg. IAS/Shunts: No atrial level shunt detected by color flow Doppler. Additional Comments: A device lead is visualized in the right ventricle.  LEFT VENTRICLE PLAX 2D LVIDd:         6.20 cm      Diastology LVIDs:         6.00 cm      LV e' medial:    4.24 cm/s LV PW:         1.10 cm      LV E/e' medial:  19.2 LV IVS:        1.00 cm      LV e' lateral:   7.62 cm/s LVOT diam:     2.30 cm      LV E/e' lateral:  10.7 LV SV:         32 LV SV Index:   15 LVOT Area:     4.15 cm LV IVRT:       77 msec  LV Volumes (MOD) LV vol d, MOD A2C: 193.0 ml LV vol d, MOD A4C: 168.0 ml LV vol s, MOD A2C: 144.0 ml LV vol s, MOD A4C: 123.0 ml LV SV MOD A2C:     49.0 ml LV SV MOD A4C:     168.0 ml LV SV MOD BP:      47.5 ml RIGHT VENTRICLE             IVC RV Basal diam:  3.65 cm     IVC diam: 2.40 cm RV S prime:     12.00 cm/s TAPSE (M-mode): 2.4 cm LEFT ATRIUM              Index        RIGHT ATRIUM           Index LA diam:        5.50 cm  2.67 cm/m   RA Area:     23.20 cm LA Vol (A2C):   81.5 ml  39.58 ml/m  RA Volume:   77.50 ml  37.64 ml/m LA Vol (A4C):   102.0 ml 49.54 ml/m LA Biplane Vol: 98.3 ml  47.74 ml/m  AORTIC VALVE LVOT Vmax:   54.80 cm/s LVOT Vmean:  34.500 cm/s LVOT VTI:    0.076 m  AORTA Ao Root diam: 3.00 cm Ao Asc diam:  2.90 cm MITRAL VALVE               TRICUSPID VALVE MV Area (PHT): 7.16 cm    TR Peak grad:   50.7 mmHg MV Decel Time: 106 msec    TR Vmax:        356.00 cm/s MR PISA:        2.26 cm MR PISA Radius: 0.60 cm    SHUNTS MV E velocity: 81.20 cm/s  Systemic VTI:  0.08 m MV A velocity: 37.90 cm/s  Systemic Diam: 2.30 cm MV E/A ratio:  2.14 Toribio Fuel MD Electronically signed by Toribio Fuel MD Signature Date/Time: 05/03/2024/10:50:56 PM    Final     Cardiac Studies  Echo 05/02/24: 1. Global hypokinesis with akinesis of the inferior wall. Left  ventricular ejection fraction, by estimation, is <20%. The left ventricle  has severely decreased function. The left ventricle demonstrates global  hypokinesis. The left ventricular internal  cavity size was mildly to moderately dilated. Left ventricular diastolic  parameters are consistent with Grade II diastolic dysfunction  (pseudonormalization).   2. Right ventricular systolic function is moderately reduced. The right  ventricular size is normal. There is severely elevated pulmonary artery  systolic pressure. The estimated right ventricular  systolic pressure is  65.7 mmHg.   3. Left atrial size was severely dilated.   4. Right atrial size was moderately dilated.   5. The mitral valve is normal in structure. Moderate to severe mitral  valve regurgitation. No evidence of mitral stenosis.   6. Tricuspid valve regurgitation is moderate.   7. The aortic valve is normal in structure. Aortic valve regurgitation is  not visualized. No aortic stenosis is present.   8. The inferior vena cava is normal in size with greater than 50%  respiratory variability, suggesting right atrial pressure of 3 mmHg.   Patient Profile   39 y.o. female with HFrEF 2/2 NICM, s/p [employer] ICD, HTN, s/p barostem here with cholecystitis now s/p cholecystectomy.   Assessment & Plan   # Acute HFrEF:  # HTN:  LVEF <20%.  She is starting to get a little volume overloaded.  Diuretics, SGLT2 inhibitor and MRA were held appropriately in the setting of acute cholecystitis now status postcholecystectomy.  Her oral intake is still poor.  However her neck veins are up and she was feeling short of breath.  She normally takes Lasix  80 mg twice daily.  Will start 80 mg p.o. daily  for now and continue to monitor.  Entresto  was started 1/26. Resume SGLT2i tomorrow.   # s/p cholecystectomy: Management per primary team.    For questions or updates, please contact [facility] Please consult [email or web address] for contact info under       Signed, Annabella Scarce, MD  05/05/2024, 10:17 AM    "

## 2024-05-05 NOTE — Progress Notes (Signed)
" °   05/05/24 0848  Vitals  Pulse Rate 77  ECG Heart Rate 81  MEWS COLOR  MEWS Score Color Green  Orthostatic Lying   BP- Lying 104/78  Pulse- Lying 75  Orthostatic Sitting  BP- Sitting 98/82  Pulse- Sitting 92  Orthostatic Standing at 0 minutes  BP- Standing at 0 minutes 114/85  Pulse- Standing at 0 minutes 95  Orthostatic Standing at 3 minutes  BP- Standing at 3 minutes 122/85  Pulse- Standing at 3 minutes 73  Oxygen Therapy  SpO2 100 %  O2 Device Room Air  MEWS Score  MEWS Temp 0  MEWS Systolic 0  MEWS Pulse 0  MEWS RR 1  MEWS LOC 0  MEWS Score 1   Dizziness, with slight drop in pressures currently sleeping   "

## 2024-05-05 NOTE — Progress Notes (Signed)
 "   Progress Note    Early Ord  FMW:[id number] DOB: 07-12-1985  DOA: 05/02/2024 PCP: Lendia Boby CROME, NP-C    Brief Narrative:   Tara Bradshaw is a 39 y.o. female with medical history significant of nonischemic cardiomyopathy s/p AICD, GERD, PVC's who presented to [facility] ED today referred from urgent care clinic for evaluation of upper abdominal pain.  Patient reports onset about 2 weeks ago that started with mild discomfort in her stomach, she initially attributed to Zepbound  but then it started to get worse.  She reports feeling abdominal pressure like someone sitting on her upper abdomen.  She has had difficulty eating meals due to feeling full quickly and increasing discomfort from the pressure.  She also reports a lot of nausea, some vomiting but only fluid.  Ultrasound suggestive of gallbladder disease and cholecystitis.  S/p removal on 1/26.  On the a.m. of 1/27 patient is complaining of dizziness and shortness of breath  Assessment/Plan:   Principal Problem:   Acute cholecystitis Active Problems:   Morbid obesity (HCC)   GERD (gastroesophageal reflux disease)   Nonischemic cardiomyopathy (HCC)   ICD (implantable cardioverter-defibrillator) in place   * Acute cholecystitis Patient presented with 2 weeks of upper abdominal pain and pressure, nausea/vomiting.  CT abdomen pelvis with contrast showed cholelithiasis with gallbladder wall thickening and Perry cholecystic edema consistent with acute cholecystitis.  No bile duct dilation was seen.  Subsequent right upper quadrant ultrasound showed findings consistent with acute cholecystitis with cholelithiasis, no bile duct dilation. EDP consulted general surgery, recommended admission, IV antibiotics, cardiology clearance for anticipated cholecystectomy. Labs stable without leukocytosis and overall normal LFTs -General Surgery consulted: Cholecystectomy on 1/26   Dizziness - Check orthostatics  Dyspnea - Patient will get  morning diuretics and will reassess to see if patient needs IV Lasix  -Ambulate -Resume home Lasix  - Home O2 eval as patient is currently on 2 L O2   ICD (implantable cardioverter-defibrillator) in place Noted. Monitor on telemetry.   Nonischemic cardiomyopathy (HCC) Seems clinically compensated on admission.  Echo in April 2025 showed EF 20-25% with LV global hypokinesis, severely dilated left atrium, mildly dilated right atrium, mod-severe MR, mild-mod TR. Patient seems fairly well compensated although does have some mild lower extremity edema and basilar rales on exam -Given IV Lasix  with good diuresis - Cardiology consult appreciated   GERD (gastroesophageal reflux disease) Resume PPI  Obesity Estimated body mass index is 45 kg/m as calculated from the following:   Height as of this encounter: 5' 2 (1.575 m).   Weight as of this encounter: 111.6 kg.      Medical Consultants:   Cardiology  general surgery     Subjective:   Complaining of some dizziness and shortness of breath this a.m.  Objective:    Vitals:   05/05/24 0426 05/05/24 0746 05/05/24 0848 05/05/24 0904  BP: 103/66 119/82    Pulse: 75 70 77   Resp: 18 (!) 24    Temp: 98 F (36.7 C) 97.6 F (36.4 C)    TempSrc: Oral Oral    SpO2: 98% 99% 100% 98%  Weight: 111.6 kg     Height:        Intake/Output Summary (Last 24 hours) at 05/05/2024 1016 Last data filed at 05/05/2024 0429 Gross per 24 hour  Intake --  Output 200 ml  Net -200 ml   Filed Weights   05/04/24 0715 05/04/24 0910 05/05/24 0426  Weight: 109.7 kg 106.1 kg 111.6 kg  Exam:  General: Appearance:    Severely obese female sitting on side of bed     Lungs:      respirations unlabored, on 2 L  Heart:    Normal heart rate.   MS:   All extremities are intact.   Neurologic:   Awake, alert     Data Reviewed:   I have personally reviewed following labs and imaging studies:  Labs: Labs show the following:   Basic Metabolic  Panel: Recent Labs  Lab 05/02/24 1454 05/02/24 1521 05/03/24 0237 05/04/24 0044 05/05/24 0246  NA 141  --  142 140 138  K 3.4*  --  3.2* 4.0 4.3  CL 103  --  105 104 102  CO2 26  --  27 28 26   GLUCOSE 92  --  110* 99 118*  BUN 10  --  9 8 8   CREATININE 0.74  --  0.80 0.88 0.71  CALCIUM  9.7  --  8.7* 8.4* 8.9  MG  --  1.7  --   --   --    GFR Estimated Creatinine Clearance: 110.2 mL/min (by C-G formula based on SCr of 0.71 mg/dL). Liver Function Tests: Recent Labs  Lab 05/02/24 1454 05/03/24 0237 05/05/24 0246  AST 35 29 34  ALT 47* 43 43  ALKPHOS 53 45 47  BILITOT 0.9 0.7 0.6  PROT 6.7 5.7* 6.0*  ALBUMIN 4.3 3.7 3.7   Recent Labs  Lab 05/02/24 1454  LIPASE 17   No results for input(s): AMMONIA in the last 168 hours. Coagulation profile Recent Labs  Lab 05/03/24 0237  INR 1.2    CBC: Recent Labs  Lab 05/02/24 1454 05/03/24 0237 05/05/24 0246  WBC 6.5 5.7 8.6  HGB 11.1* 10.1* 11.4*  HCT 35.0* 30.9* 34.8*  MCV 85.8 84.2 84.3  PLT 165 165 183   Cardiac Enzymes: No results for input(s): CKTOTAL, CKMB, CKMBINDEX, TROPONINI in the last 168 hours. BNP (last 3 results) Recent Labs    05/02/24 1454  PROBNP 3,347.0*   CBG: No results for input(s): GLUCAP in the last 168 hours. D-Dimer: No results for input(s): DDIMER in the last 72 hours. Hgb A1c: No results for input(s): HGBA1C in the last 72 hours. Lipid Profile: No results for input(s): CHOL, HDL, LDLCALC, TRIG, CHOLHDL, LDLDIRECT in the last 72 hours. Thyroid  function studies: No results for input(s): TSH, T4TOTAL, T3FREE, THYROIDAB in the last 72 hours.  Invalid input(s): FREET3 Anemia work up: No results for input(s): VITAMINB12, FOLATE, FERRITIN, TIBC, IRON, RETICCTPCT in the last 72 hours. Sepsis Labs: Recent Labs  Lab 05/02/24 1454 05/03/24 0237 05/05/24 0246  WBC 6.5 5.7 8.6    Microbiology Recent Results (from the past 240  hours)  MRSA Next Gen by PCR, Nasal     Status: None   Collection Time: 05/04/24  9:28 AM   Specimen: Nasal Mucosa; Nasal Swab  Result Value Ref Range Status   MRSA by PCR Next Gen NOT DETECTED NOT DETECTED Final    Comment: (NOTE) The GeneXpert MRSA Assay (FDA approved for NASAL specimens only), is one component of a comprehensive MRSA colonization surveillance program. It is not intended to diagnose MRSA infection nor to guide or monitor treatment for MRSA infections. Test performance is not FDA approved in patients less than 19 years old. Performed at [facility], [address] [place], [place] [address]     Procedures and diagnostic studies:  ECHOCARDIOGRAM COMPLETE Result Date: 05/03/2024    ECHOCARDIOGRAM  REPORT   Patient Name:   KEIRSTAN IANNELLO Date of Exam: 05/03/2024 Medical Rec #:  [id number]      Height:       62.0 in Accession #:    [id number]     Weight:       238.2 lb Date of Birth:  1985/08/05      BSA:          2.059 m Patient Age:    38 years       BP:           110/81 mmHg Patient Gender: F              HR:           84 bpm. Exam Location:  Inpatient Procedure: 2D Echo (Both Spectral and Color Flow Doppler were utilized during            procedure). Indications:    nonischemic cardiomyopathy  History:        Patient has prior history of Echocardiogram examinations, most                 recent 07/29/2023. Cardiomyopathy; Defibrillator.  Sonographer:    Tinnie Barefoot RDCS Referring Phys: [id number] KELLY A GRIFFITH IMPRESSIONS  1. Global hypokinesis with akinesis of the inferior wall. Left ventricular ejection fraction, by estimation, is <20%. The left ventricle has severely decreased function. The left ventricle demonstrates global hypokinesis. The left ventricular internal cavity size was mildly to moderately dilated. Left ventricular diastolic parameters are consistent with Grade II diastolic dysfunction (pseudonormalization).  2. Right ventricular systolic function is  moderately reduced. The right ventricular size is normal. There is severely elevated pulmonary artery systolic pressure. The estimated right ventricular systolic pressure is 65.7 mmHg.  3. Left atrial size was severely dilated.  4. Right atrial size was moderately dilated.  5. The mitral valve is normal in structure. Moderate to severe mitral valve regurgitation. No evidence of mitral stenosis.  6. Tricuspid valve regurgitation is moderate.  7. The aortic valve is normal in structure. Aortic valve regurgitation is not visualized. No aortic stenosis is present.  8. The inferior vena cava is normal in size with greater than 50% respiratory variability, suggesting right atrial pressure of 3 mmHg. FINDINGS  Left Ventricle: Global hypokinesis with akinesis of the inferior wall. Left ventricular ejection fraction, by estimation, is <20%. The left ventricle has severely decreased function. The left ventricle demonstrates global hypokinesis. The left ventricular internal cavity size was mildly to moderately dilated. There is no left ventricular hypertrophy. Left ventricular diastolic parameters are consistent with Grade II diastolic dysfunction (pseudonormalization). Right Ventricle: The right ventricular size is normal. No increase in right ventricular wall thickness. Right ventricular systolic function is moderately reduced. There is severely elevated pulmonary artery systolic pressure. The tricuspid regurgitant velocity is 3.56 m/s, and with an assumed right atrial pressure of 15 mmHg, the estimated right ventricular systolic pressure is 65.7 mmHg. Left Atrium: Left atrial size was severely dilated. Right Atrium: Right atrial size was moderately dilated. Pericardium: There is no evidence of pericardial effusion. Mitral Valve: The mitral valve is normal in structure. Moderate to severe mitral valve regurgitation, with posteriorly-directed jet. No evidence of mitral valve stenosis. Tricuspid Valve: The tricuspid valve is  normal in structure. Tricuspid valve regurgitation is moderate . No evidence of tricuspid stenosis. Aortic Valve: The aortic valve is normal in structure. Aortic valve regurgitation is not visualized. No aortic stenosis is present. Pulmonic Valve: The pulmonic valve was  normal in structure. Pulmonic valve regurgitation is mild. No evidence of pulmonic stenosis. Aorta: The aortic root is normal in size and structure. Venous: The inferior vena cava is normal in size with greater than 50% respiratory variability, suggesting right atrial pressure of 3 mmHg. IAS/Shunts: No atrial level shunt detected by color flow Doppler. Additional Comments: A device lead is visualized in the right ventricle.  LEFT VENTRICLE PLAX 2D LVIDd:         6.20 cm      Diastology LVIDs:         6.00 cm      LV e' medial:    4.24 cm/s LV PW:         1.10 cm      LV E/e' medial:  19.2 LV IVS:        1.00 cm      LV e' lateral:   7.62 cm/s LVOT diam:     2.30 cm      LV E/e' lateral: 10.7 LV SV:         32 LV SV Index:   15 LVOT Area:     4.15 cm LV IVRT:       77 msec  LV Volumes (MOD) LV vol d, MOD A2C: 193.0 ml LV vol d, MOD A4C: 168.0 ml LV vol s, MOD A2C: 144.0 ml LV vol s, MOD A4C: 123.0 ml LV SV MOD A2C:     49.0 ml LV SV MOD A4C:     168.0 ml LV SV MOD BP:      47.5 ml RIGHT VENTRICLE             IVC RV Basal diam:  3.65 cm     IVC diam: 2.40 cm RV S prime:     12.00 cm/s TAPSE (M-mode): 2.4 cm LEFT ATRIUM              Index        RIGHT ATRIUM           Index LA diam:        5.50 cm  2.67 cm/m   RA Area:     23.20 cm LA Vol (A2C):   81.5 ml  39.58 ml/m  RA Volume:   77.50 ml  37.64 ml/m LA Vol (A4C):   102.0 ml 49.54 ml/m LA Biplane Vol: 98.3 ml  47.74 ml/m  AORTIC VALVE LVOT Vmax:   54.80 cm/s LVOT Vmean:  34.500 cm/s LVOT VTI:    0.076 m  AORTA Ao Root diam: 3.00 cm Ao Asc diam:  2.90 cm MITRAL VALVE               TRICUSPID VALVE MV Area (PHT): 7.16 cm    TR Peak grad:   50.7 mmHg MV Decel Time: 106 msec    TR Vmax:         356.00 cm/s MR PISA:        2.26 cm MR PISA Radius: 0.60 cm    SHUNTS MV E velocity: 81.20 cm/s  Systemic VTI:  0.08 m MV A velocity: 37.90 cm/s  Systemic Diam: 2.30 cm MV E/A ratio:  2.14 Toribio Fuel MD Electronically signed by Toribio Fuel MD Signature Date/Time: 05/03/2024/10:50:56 PM    Final     Medications:    docusate sodium   100 mg Oral BID   gabapentin   300 mg Oral QHS   sacubitril -valsartan   1 tablet Oral BID   Continuous Infusions:  LOS: 2 days   Harlene RAYMOND Bowl  [facility]   How to contact the [facility] Attending or Consulting provider 7A - 7P or covering provider during after hours 7P -7A, for this patient?  Check the care team in [facility] and look for a) attending/consulting TR[facility] provider listed and b) the TRH team listed Log into [email or web address] and use [place]'s universal password to access. If you do not have the password, please contact the hospital operator. Locate the TRH provider you are looking for under [facility] and page to a number that you can be directly reached. If you still have difficulty reaching the provider, please page the [facility] (Director on Call) for the Hospitalists listed on amion for assistance.  05/05/2024, 10:16 AM           "

## 2024-05-05 NOTE — Research (Signed)
" °   Date of Visit (Screening): 05-05-2024   Physical Exam :  Electrocardiogram Complete Screening Labs(Local)  Chemistry,Hematology, Nt-ProBNP, HS Troponin I  EQ-5D-5L:  Complete  KCCQ: Complete              COMET  Informed Consent   Subject Name: Tara Bradshaw  Subject met inclusion and exclusion criteria.  The informed consent form, study requirements and expectations were reviewed with the subject and questions and concerns were addressed prior to the signing of the consent form.  The subject verbalized understanding of the trial requirements.  The subject agreed to participate in the COMET trial and signed the informed consent at 11:30 on 05-05-2024.  The informed consent was obtained prior to performance of any protocol-specific procedures for the subject.  A copy of the signed informed consent was given to the subject and a copy was placed in the subject's medical record.   Tara Bradshaw  Eligibility Criteria:  More detailed eligibility criteria are presented in Section 5. Inclusion Criteria Adult patients who meet all the following criteria at screening may be included in the study:  Are between >= 18 years and <= 85 years at the signing of informed consent  Have a history of chronic HFrEF, defined as requiring treatment for HF for a minimum of  3 months prior to screening  Are receiving oral loop diuretics  Patients without AFF on screening ECG: ? LVEF < 30% within 6 months of screening ? Elevated N-terminal prohormone of B-type natriuretic peptide (NT-proBNP) >= 1000  pg/mL (BNP >= 300 pg/mL   Patients with AFF on screening ECG: ? LVEF < 25% within 6 months of screening ? Elevated N-terminal prohormone of B-type natriuretic peptide (NT-proBNP) >= 3000  pg/mL (BNP >= 900 pg/mL) ? Not currently taking digoxin   Are currently hospitalized with the primary reason of HF, or had an HF event within 6  months prior to screening  Are established on regional  standard-of-care HF therapies for at least 30 days prior to screening  Systolic blood pressure <= 130 mmHg and diastolic blood pressure <= 90 mmH   Exclusion Criteria  Any of the following criteria will exclude potential patients from the study:  Have AFF on the screening ECG and are currently taking digoxin   Have had acute coronary syndrome, cardiac surgery, valve surgery, any coronary  revascularization, and/or cardiac resynchronization therapy within 3 months of screening  Are admitted to a long-term care facility or hospice  Have a projected survival of < 12 months due to non-cardiovascular causes based on clinical  judgment  Are receiving intravenous inotropes or intravenous vasopressors <= 3 days prior to screening  Are receiving mechanical hemodynamic support or mechanical ventilation <= 7 days prior to  screening  Are receiving intravenous diuretics, intravenous vasodilators, or supplemental oxygen therapy  <= 12 hours prior to screening  Have an estimated glomerular filtration rate (eGFR) < 20 mL/min/1.67m2 or receiving dialysis  at screening  Have previously had a solid organ transplant   Are receiving treatment in another investigational device or drug study or are within 30 days of  ending such investigational treatment at screening  Have previously received omecamtiv mecarbil  Are pregnant or planning pregnancy during the study period, or planning to breastfeed during  treatment with IP or within 5 days after the end of treatment with I  "

## 2024-05-05 NOTE — Plan of Care (Signed)

## 2024-05-06 ENCOUNTER — Encounter: Payer: Self-pay | Admitting: *Deleted

## 2024-05-06 ENCOUNTER — Encounter: Admitting: *Deleted

## 2024-05-06 ENCOUNTER — Other Ambulatory Visit (HOSPITAL_COMMUNITY): Payer: Self-pay

## 2024-05-06 DIAGNOSIS — K810 Acute cholecystitis: Secondary | ICD-10-CM | POA: Diagnosis not present

## 2024-05-06 DIAGNOSIS — Z006 Encounter for examination for normal comparison and control in clinical research program: Secondary | ICD-10-CM

## 2024-05-06 LAB — CBC
HCT: 34.5 % — ABNORMAL LOW (ref 36.0–46.0)
Hemoglobin: 11.1 g/dL — ABNORMAL LOW (ref 12.0–15.0)
MCH: 27.4 pg (ref 26.0–34.0)
MCHC: 32.2 g/dL (ref 30.0–36.0)
MCV: 85.2 fL (ref 80.0–100.0)
Platelets: 188 10*3/uL (ref 150–400)
RBC: 4.05 MIL/uL (ref 3.87–5.11)
RDW: 16.2 % — ABNORMAL HIGH (ref 11.5–15.5)
WBC: 10.1 10*3/uL (ref 4.0–10.5)
nRBC: 0 % (ref 0.0–0.2)

## 2024-05-06 LAB — BASIC METABOLIC PANEL WITH GFR
Anion gap: 7 (ref 5–15)
BUN: 7 mg/dL (ref 6–20)
CO2: 27 mmol/L (ref 22–32)
Calcium: 8.7 mg/dL — ABNORMAL LOW (ref 8.9–10.3)
Chloride: 105 mmol/L (ref 98–111)
Creatinine, Ser: 0.73 mg/dL (ref 0.44–1.00)
GFR, Estimated: >60 mL/min
Glucose, Bld: 107 mg/dL — ABNORMAL HIGH (ref 70–99)
Potassium: 3.9 mmol/L (ref 3.5–5.1)
Sodium: 139 mmol/L (ref 135–145)

## 2024-05-06 LAB — MAGNESIUM: Magnesium: 2 mg/dL (ref 1.7–2.4)

## 2024-05-06 MED ORDER — EMPAGLIFLOZIN 10 MG PO TABS
10.0000 mg | ORAL_TABLET | Freq: Every day | ORAL | Status: DC
  Administered 2024-05-06 – 2024-05-08 (×3): 10 mg via ORAL
  Filled 2024-05-06 (×3): qty 1

## 2024-05-06 MED ORDER — POTASSIUM CHLORIDE 20 MEQ PO PACK
20.0000 meq | PACK | Freq: Once | ORAL | Status: AC
  Administered 2024-05-06: 20 meq via ORAL
  Filled 2024-05-06: qty 1

## 2024-05-06 MED ORDER — MIDODRINE HCL 5 MG PO TABS
10.0000 mg | ORAL_TABLET | Freq: Once | ORAL | Status: AC
  Administered 2024-05-06: 10 mg via ORAL
  Filled 2024-05-06: qty 2

## 2024-05-06 MED ORDER — SACUBITRIL-VALSARTAN 24-26 MG PO TABS
1.0000 | ORAL_TABLET | Freq: Two times a day (BID) | ORAL | Status: DC
  Administered 2024-05-06: 1 via ORAL
  Filled 2024-05-06 (×3): qty 1

## 2024-05-06 MED ORDER — MAGNESIUM SULFATE IN D5W 1-5 GM/100ML-% IV SOLN
1.0000 g | Freq: Once | INTRAVENOUS | Status: AC
  Administered 2024-05-06: 1 g via INTRAVENOUS
  Filled 2024-05-06 (×2): qty 100

## 2024-05-06 NOTE — Progress Notes (Signed)
 GENERAL: NAD, fair appearing PULM:  Normal work of breathing, CTAB CARDIAC:  JVP: mildly elevated         Normal rate with regular rhythm. No murmurs, rubs or gallops.  Trace edema. Warm and well perfused extremities. ABDOMEN: Soft

## 2024-05-06 NOTE — Research (Signed)
 Was unable to get patients blood yesterday, Had lab collect blood this morning with am labs.     Jennine Peddy, Researh Coordinator

## 2024-05-06 NOTE — Progress Notes (Signed)
 "  Rounding Note   Patient Name: Tara Bradshaw Date of Encounter: 05/06/2024  [facility] Cardiologist: Toribio Fuel, MD   Subjective Patient laying flat in bed   Comfortable Breathing is overall ok though occ feels like can't get full breath  Scheduled Meds:  docusate sodium   100 mg Oral BID   furosemide   80 mg Oral Daily   gabapentin   300 mg Oral QHS   pantoprazole   40 mg Oral Daily   sacubitril -valsartan   1 tablet Oral BID   Continuous Infusions:  magnesium  sulfate bolus IVPB     PRN Meds: acetaminophen  **OR** acetaminophen , meclizine , morphine  injection, ondansetron  (ZOFRAN ) IV, oxyCODONE    Vital Signs  Vitals:   05/05/24 2055 05/05/24 2345 05/06/24 0139 05/06/24 0300  BP: 106/71 (!) 89/64 (!) 87/62 100/73  Pulse:  72  64  Resp:  16  18  Temp:  97.8 F (36.6 C)  98.4 F (36.9 C)  TempSrc:  Oral  Oral  SpO2:  98%  92%  Weight:    109.3 kg  Height:    5' 2 (1.575 m)    Intake/Output Summary (Last 24 hours) at 05/06/2024 0803 Last data filed at 05/06/2024 0300 Gross per 24 hour  Intake 564.52 ml  Output 1150 ml  Net -585.48 ml    Net neg 2.1 L      05/06/2024    3:00 AM 05/05/2024    4:26 AM 05/04/2024    9:10 AM  Last 3 Weights  Weight (lbs) 240 lb 15.4 oz 246 lb 0.5 oz 234 lb  Weight (kg) 109.3 kg 111.6 kg 106.142 kg      Telemetry Sinus rhythm. PVC  `One 19 beat VT  One 17 beat VT - Personally Reviewed  ECG  No new - Personally Reviewed  Physical Exam  VS:  BP 100/73 (BP Location: Right Arm)   Pulse 64   Temp 98.4 F (36.9 C) (Oral)   Resp 18   Ht 5' 2 (1.575 m)   Wt 109.3 kg   SpO2 92%   BMI 44.07 kg/m  , BMI Body mass index is 44.07 kg/m. GENERAL:  Well appearing  NECK: JVP is mildly increased LUNGS:  Clear to auscultation bilaterally HEART:  RRR.  No murmurs  ABD:  Deferred  EXT: Trace edema   Labs High Sensitivity Troponin:  No results for input(s): TROPONINIHS in the last 720 hours.  Recent Labs  Lab  05/02/24 1454  TRNPT 6       Chemistry Recent Labs  Lab 05/02/24 1454 05/02/24 1521 05/03/24 0237 05/04/24 0044 05/05/24 0246 05/06/24 0323  NA 141  --  142 140 138 139  K 3.4*  --  3.2* 4.0 4.3 3.9  CL 103  --  105 104 102 105  CO2 26  --  27 28 26 27   GLUCOSE 92  --  110* 99 118* 107*  BUN 10  --  9 8 8 7   CREATININE 0.74  --  0.80 0.88 0.71 0.73  CALCIUM  9.7  --  8.7* 8.4* 8.9 8.7*  MG  --  1.7  --   --   --  2.0  PROT 6.7  --  5.7*  --  6.0*  --   ALBUMIN 4.3  --  3.7  --  3.7  --   AST 35  --  29  --  34  --   ALT 47*  --  43  --  43  --  ALKPHOS 53  --  45  --  47  --   BILITOT 0.9  --  0.7  --  0.6  --   GFRNONAA >60  --  >60 >60 >60 >60  ANIONGAP 12  --  10 7 9 7     Lipids No results for input(s): CHOL, TRIG, HDL, LABVLDL, LDLCALC, CHOLHDL in the last 168 hours.  Hematology Recent Labs  Lab 05/03/24 0237 05/05/24 0246 05/06/24 0323  WBC 5.7 8.6 10.1  RBC 3.67* 4.13 4.05  HGB 10.1* 11.4* 11.1*  HCT 30.9* 34.8* 34.5*  MCV 84.2 84.3 85.2  MCH 27.5 27.6 27.4  MCHC 32.7 32.8 32.2  RDW 15.9* 15.9* 16.2*  PLT 165 183 188   Thyroid  No results for input(s): TSH, FREET4 in the last 168 hours.  BNP Recent Labs  Lab 05/02/24 1454  PROBNP 3,347.0*    DDimer No results for input(s): DDIMER in the last 168 hours.   Radiology  No results found.   Cardiac Studies  Echo 05/02/24: 1. Global hypokinesis with akinesis of the inferior wall. Left  ventricular ejection fraction, by estimation, is <20%. The left ventricle  has severely decreased function. The left ventricle demonstrates global  hypokinesis. The left ventricular internal  cavity size was mildly to moderately dilated. Left ventricular diastolic  parameters are consistent with Grade II diastolic dysfunction  (pseudonormalization).   2. Right ventricular systolic function is moderately reduced. The right  ventricular size is normal. There is severely elevated pulmonary artery   systolic pressure. The estimated right ventricular systolic pressure is  65.7 mmHg.   3. Left atrial size was severely dilated.   4. Right atrial size was moderately dilated.   5. The mitral valve is normal in structure. Moderate to severe mitral  valve regurgitation. No evidence of mitral stenosis.   6. Tricuspid valve regurgitation is moderate.   7. The aortic valve is normal in structure. Aortic valve regurgitation is  not visualized. No aortic stenosis is present.   8. The inferior vena cava is normal in size with greater than 50%  respiratory variability, suggesting right atrial pressure of 3 mmHg.   Patient Profile   39 y.o. female with HFrEF 2/2 NICM, s/p [employer] ICD, HTN, s/p barostem here with cholecystitis now s/p cholecystectomy.   Assessment & Plan   # Acute  on chronic HFrEF:   LVEF <20%.   Meds were held in setting of cholecystitis. Now Entresto  and lasix  resumed Volume still mildly increased With marginal BP and pt complaining of dizziness I would recomm pulling back on Entresto  to 24/26  Follow BP and advance as tolerates OK to resume SGLT2i today   #  Hx HTN  BP currently low to low normal   Cut back on meds a bit as noted above    # s/p cholecystectomy: Management per primary team.    For questions or updates, please contact [facility] Please consult [email or web address] for contact info under       Signed, Vina Gull, MD  05/06/2024, 8:03 AM    "

## 2024-05-06 NOTE — Plan of Care (Signed)
   Problem: Education: Goal: Knowledge of General Education information will improve Description: Including pain rating scale, medication(s)/side effects and non-pharmacologic comfort measures Outcome: Progressing   Problem: Clinical Measurements: Goal: Respiratory complications will improve Outcome: Progressing   Problem: Activity: Goal: Risk for activity intolerance will decrease Outcome: Progressing

## 2024-05-06 NOTE — Plan of Care (Signed)

## 2024-05-06 NOTE — Progress Notes (Signed)
 2 Days Post-Op   Subjective/Chief Complaint: Doing well, tol diet, some soreness as expected   Objective: Vital signs in last 24 hours: Temp:  [97.6 F (36.4 C)-98.4 F (36.9 C)] 98.1 F (36.7 C) (01/28 0808) Pulse Rate:  [62-86] 86 (01/28 0808) Resp:  [16-20] 20 (01/28 0808) BP: (87-117)/(62-80) 95/66 (01/28 0808) SpO2:  [92 %-100 %] 92 % (01/28 0300) Weight:  [109.3 kg] 109.3 kg (01/28 0300) Last BM Date : 05/02/24  Intake/Output from previous day: 01/27 0701 - 01/28 0700 In: 564.5 [P.O.:520; IV Piggyback:44.5] Out: 1150 [Urine:1150] Intake/Output this shift: No intake/output data recorded.  Ab soft approp tender incisions clean  Lab Results:  Recent Labs    05/05/24 0246 05/06/24 0323  WBC 8.6 10.1  HGB 11.4* 11.1*  HCT 34.8* 34.5*  PLT 183 188   BMET Recent Labs    05/05/24 0246 05/06/24 0323  NA 138 139  K 4.3 3.9  CL 102 105  CO2 26 27  GLUCOSE 118* 107*  BUN 8 7  CREATININE 0.71 0.73  CALCIUM  8.9 8.7*   PT/INR No results for input(s): LABPROT, INR in the last 72 hours.  ABG No results for input(s): PHART, HCO3 in the last 72 hours.  Invalid input(s): PCO2, PO2  Studies/Results: No results found.   Anti-infectives: Anti-infectives (From admission, onward)    Start     Dose/Rate Route Frequency Ordered Stop   05/02/24 2330  piperacillin -tazobactam (ZOSYN ) IVPB 3.375 g  Status:  Discontinued        3.375 g 12.5 mL/hr over 240 Minutes Intravenous Every 8 hours 05/02/24 2210 05/04/24 1346   05/02/24 1730  piperacillin -tazobactam (ZOSYN ) IVPB 3.375 g        3.375 g 12.5 mL/hr over 240 Minutes Intravenous  Once 05/02/24 1726 05/02/24 2204   05/02/24 1715  cefTRIAXone  (ROCEPHIN ) 2 g in sodium chloride  0.9 % 100 mL IVPB  Status:  Discontinued        2 g 200 mL/hr over 30 Minutes Intravenous  Once 05/02/24 1713 05/02/24 1726       Assessment/Plan: POD 2 s/p lap chole -can dc home when ready medically -follow up  arranged -otherwise doing well surgically, message send to primary service to discuss our recommendations.  We will be available if needed   Burnard FORBES Banter 05/06/2024

## 2024-05-06 NOTE — Progress Notes (Signed)
 " PROGRESS NOTE    Tara Bradshaw  FMW:[id number] DOB: 02/14/1986 DOA: 05/02/2024 PCP: Lendia Boby CROME, NP-C  39/F with chronic systolic CHF, NICM, AICD, GERD, PVCs presented to [facility] with upper abdominal pain - Further workup she was noted to have acute cholecystitis - Seen by general surgery in consultation, underwent lap chole on 1/26 - Postop course complicated by CHF exacerbation, cards following, slowly restarting GDMT  Subjective: -Reports some dyspnea, mild abdominal discomfort, oral intake is improving, no BM yet, positive flatus  Assessment and Plan:  Acute cholecystitis - Status post lap chole on 1/26 - Slowly improving - Cleared by general surgery for discharge home today, follow-up recommended  Acute on chronic systolic CHF, severe BiV failure Severe pulmonary hypertension - Last echo 1/24 noted EF less than 20%, grade 2 DD, moderately reduced RV, severely elevated PASP - Clinically appears mildly volume overloaded - Cards following, now on oral Lasix , Entresto  resumed, dose decreased with soft BP  -starting Jardiance  today  Morbid obesity (HCC) Complicates overall care and prognosis.  Recommend lifestyle modifications including physical activity and diet for weight loss and overall long-term health. --On Zepbound  - confirm pending med hx  ICD (implantable cardioverter-defibrillator) in place Noted. Monitor on telemetry.  GERD (gastroesophageal reflux disease) Resume PPI  DVT prophylaxis: SCDs Code Status: Full code Family Communication: Friend at bedside Disposition Plan: Home likely tomorrow  Consultants:    Procedures:   Antimicrobials:    Objective: Vitals:   05/06/24 0300 05/06/24 0808 05/06/24 1100 05/06/24 1129  BP: 100/73 95/66 (!) 86/60 103/76  Pulse: 64 86 80 86  Resp: 18 20 18 18   Temp: 98.4 F (36.9 C) 98.1 F (36.7 C)    TempSrc: Oral Oral    SpO2: 92%     Weight: 109.3 kg     Height: 5' 2 (1.575 m)        Intake/Output Summary (Last 24 hours) at 05/06/2024 1133 Last data filed at 05/06/2024 0300 Gross per 24 hour  Intake 564.52 ml  Output 1150 ml  Net -585.48 ml   Filed Weights   05/04/24 0910 05/05/24 0426 05/06/24 0300  Weight: 106.1 kg 111.6 kg 109.3 kg    Examination:  General exam: Pleasant, AO x 3, no distress HEENT positive JVD Respiratory system: Clear to auscultation Cardiovascular system: S1 & S2 heard, RRR.  Abd: Soft, abdomen mildly tender, incisional sites unremarkable Central nervous system: Alert and oriented. No focal neurological deficits. Extremities: Trace edema Skin: No rashes Psychiatry:  Mood & affect appropriate.     Data Reviewed:   CBC: Recent Labs  Lab 05/02/24 1454 05/03/24 0237 05/05/24 0246 05/06/24 0323  WBC 6.5 5.7 8.6 10.1  HGB 11.1* 10.1* 11.4* 11.1*  HCT 35.0* 30.9* 34.8* 34.5*  MCV 85.8 84.2 84.3 85.2  PLT 165 165 183 188   Basic Metabolic Panel: Recent Labs  Lab 05/02/24 1454 05/02/24 1521 05/03/24 0237 05/04/24 0044 05/05/24 0246 05/06/24 0323  NA 141  --  142 140 138 139  K 3.4*  --  3.2* 4.0 4.3 3.9  CL 103  --  105 104 102 105  CO2 26  --  27 28 26 27   GLUCOSE 92  --  110* 99 118* 107*  BUN 10  --  9 8 8 7   CREATININE 0.74  --  0.80 0.88 0.71 0.73  CALCIUM  9.7  --  8.7* 8.4* 8.9 8.7*  MG  --  1.7  --   --   --  2.0   GFR: Estimated Creatinine Clearance: 108.9 mL/min (by C-G formula based on SCr of 0.73 mg/dL). Liver Function Tests: Recent Labs  Lab 05/02/24 1454 05/03/24 0237 05/05/24 0246  AST 35 29 34  ALT 47* 43 43  ALKPHOS 53 45 47  BILITOT 0.9 0.7 0.6  PROT 6.7 5.7* 6.0*  ALBUMIN 4.3 3.7 3.7   Recent Labs  Lab 05/02/24 1454  LIPASE 17   No results for input(s): AMMONIA in the last 168 hours. Coagulation Profile: Recent Labs  Lab 05/03/24 0237  INR 1.2   Cardiac Enzymes: No results for input(s): CKTOTAL, CKMB, CKMBINDEX, TROPONINI in the last 168 hours. BNP (last 3  results) Recent Labs    05/02/24 1454  PROBNP 3,347.0*   HbA1C: No results for input(s): HGBA1C in the last 72 hours. CBG: No results for input(s): GLUCAP in the last 168 hours. Lipid Profile: No results for input(s): CHOL, HDL, LDLCALC, TRIG, CHOLHDL, LDLDIRECT in the last 72 hours. Thyroid  Function Tests: No results for input(s): TSH, T4TOTAL, FREET4, T3FREE, THYROIDAB in the last 72 hours. Anemia Panel: No results for input(s): VITAMINB12, FOLATE, FERRITIN, TIBC, IRON, RETICCTPCT in the last 72 hours. Urine analysis:    Component Value Date/Time   COLORURINE YELLOW 11/10/2022 1830   APPEARANCEUR HAZY (A) 11/10/2022 1830   LABSPEC 1.025 11/10/2022 1830   PHURINE 5.0 11/10/2022 1830   GLUCOSEU NEGATIVE 11/10/2022 1830   HGBUR NEGATIVE 11/10/2022 1830   BILIRUBINUR NEGATIVE 11/10/2022 1830   BILIRUBINUR neg 04/21/2012 1530   KETONESUR NEGATIVE 11/10/2022 1830   PROTEINUR NEGATIVE 11/10/2022 1830   UROBILINOGEN 1.0 11/26/2016 1305   NITRITE NEGATIVE 11/10/2022 1830   LEUKOCYTESUR NEGATIVE 11/10/2022 1830   Sepsis Labs: @LABRCNTIP (procalcitonin:4,lacticidven:4)  ) Recent Results (from the past 240 hours)  MRSA Next Gen by PCR, Nasal     Status: None   Collection Time: 05/04/24  9:28 AM   Specimen: Nasal Mucosa; Nasal Swab  Result Value Ref Range Status   MRSA by PCR Next Gen NOT DETECTED NOT DETECTED Final    Comment: (NOTE) The GeneXpert MRSA Assay (FDA approved for NASAL specimens only), is one component of a comprehensive MRSA colonization surveillance program. It is not intended to diagnose MRSA infection nor to guide or monitor treatment for MRSA infections. Test performance is not FDA approved in patients less than 10 years old. Performed at [facility], [address] [place], [place] [address]      Radiology Studies: No results found.   Scheduled Meds:  docusate sodium   100 mg Oral BID   empagliflozin   10  mg Oral Daily   furosemide   80 mg Oral Daily   gabapentin   300 mg Oral QHS   pantoprazole   40 mg Oral Daily   sacubitril -valsartan   1 tablet Oral BID   sacubitril -valsartan   1 tablet Oral BID   Continuous Infusions:   LOS: 3 days    Time spent:    Sigurd Pac, MD [facility]   05/06/2024, 11:33 AM    "

## 2024-05-06 NOTE — Discharge Instructions (Signed)

## 2024-05-06 NOTE — Research (Signed)
 ORDERS ONLY

## 2024-05-07 ENCOUNTER — Other Ambulatory Visit (HOSPITAL_COMMUNITY): Payer: Self-pay

## 2024-05-07 DIAGNOSIS — K810 Acute cholecystitis: Secondary | ICD-10-CM | POA: Diagnosis not present

## 2024-05-07 LAB — BASIC METABOLIC PANEL WITH GFR
Anion gap: 10 (ref 5–15)
BUN: 8 mg/dL (ref 6–20)
CO2: 25 mmol/L (ref 22–32)
Calcium: 8.2 mg/dL — ABNORMAL LOW (ref 8.9–10.3)
Chloride: 105 mmol/L (ref 98–111)
Creatinine, Ser: 0.8 mg/dL (ref 0.44–1.00)
GFR, Estimated: >60 mL/min
Glucose, Bld: 93 mg/dL (ref 70–99)
Potassium: 4 mmol/L (ref 3.5–5.1)
Sodium: 140 mmol/L (ref 135–145)

## 2024-05-07 LAB — CBC
HCT: 32.1 % — ABNORMAL LOW (ref 36.0–46.0)
Hemoglobin: 10.3 g/dL — ABNORMAL LOW (ref 12.0–15.0)
MCH: 27.8 pg (ref 26.0–34.0)
MCHC: 32.1 g/dL (ref 30.0–36.0)
MCV: 86.8 fL (ref 80.0–100.0)
Platelets: 164 10*3/uL (ref 150–400)
RBC: 3.7 MIL/uL — ABNORMAL LOW (ref 3.87–5.11)
RDW: 16.1 % — ABNORMAL HIGH (ref 11.5–15.5)
WBC: 8.9 10*3/uL (ref 4.0–10.5)
nRBC: 0 % (ref 0.0–0.2)

## 2024-05-07 LAB — MISC LABCORP TEST (SEND OUT): Labcorp test code: 1404150

## 2024-05-07 LAB — LACTIC ACID, PLASMA: Lactic Acid, Venous: 0.8 mmol/L (ref 0.5–1.9)

## 2024-05-07 LAB — TROPONIN T: Troponin T (Highly Sensitive): <6 ng/L (ref 0–14)

## 2024-05-07 MED ORDER — SODIUM CHLORIDE 0.9 % IV SOLN: INTRAVENOUS | Status: DC

## 2024-05-07 MED ORDER — ASPIRIN 81 MG PO CHEW
81.0000 mg | CHEWABLE_TABLET | ORAL | Status: AC
  Administered 2024-05-08: 81 mg via ORAL
  Filled 2024-05-07: qty 1

## 2024-05-07 NOTE — Progress Notes (Signed)
 Remote ICD Transmission

## 2024-05-07 NOTE — Progress Notes (Signed)
 "  Progress Note  Patient Name: Tara Bradshaw Date of Encounter: 05/07/2024  Primary Cardiologist: Toribio Fuel, MD   Subjective   Patient seen and examined at her bedside.   Inpatient Medications    Scheduled Meds:  docusate sodium   100 mg Oral BID   empagliflozin   10 mg Oral Daily   furosemide   80 mg Oral Daily   gabapentin   300 mg Oral QHS   pantoprazole   40 mg Oral Daily   sacubitril -valsartan   1 tablet Oral BID   Continuous Infusions:  PRN Meds: acetaminophen  **OR** acetaminophen , meclizine , morphine  injection, ondansetron  (ZOFRAN ) IV, oxyCODONE    Vital Signs    Vitals:   05/06/24 2352 05/07/24 0300 05/07/24 0726 05/07/24 0948  BP: (!) 90/57 101/69 (!) 86/58 92/72  Pulse: 80 68 72   Resp: 16 18 18    Temp: 98.4 F (36.9 C) 97.7 F (36.5 C) 97.9 F (36.6 C)   TempSrc: Oral Oral Oral   SpO2: 99%  98%   Weight:  107.9 kg    Height:  5' 2 (1.575 m)      Intake/Output Summary (Last 24 hours) at 05/07/2024 1118 Last data filed at 05/07/2024 0300 Gross per 24 hour  Intake 240 ml  Output 1150 ml  Net -910 ml   Filed Weights   05/05/24 0426 05/06/24 0300 05/07/24 0300  Weight: 111.6 kg 109.3 kg 107.9 kg    Telemetry     - Personally Reviewed  ECG     - Personally Reviewed  Physical Exam    General: Comfortable Head: Atraumatic, normal size  Eyes: PEERLA, EOMI  Neck: Supple, normal JVD Cardiac: Normal S1, S2; RRR; no murmurs, rubs, or gallops Lungs: Clear to auscultation bilaterally Abd: Soft, nontender, no hepatomegaly  Ext: warm, no edema   Labs    Chemistry Recent Labs  Lab 05/02/24 1454 05/03/24 0237 05/04/24 0044 05/05/24 0246 05/06/24 0323 05/07/24 0239  NA 141 142   < > 138 139 140  K 3.4* 3.2*   < > 4.3 3.9 4.0  CL 103 105   < > 102 105 105  CO2 26 27   < > 26 27 25   GLUCOSE 92 110*   < > 118* 107* 93  BUN 10 9   < > 8 7 8   CREATININE 0.74 0.80   < > 0.71 0.73 0.80  CALCIUM  9.7 8.7*   < > 8.9 8.7* 8.2*  PROT 6.7 5.7*   --  6.0*  --   --   ALBUMIN 4.3 3.7  --  3.7  --   --   AST 35 29  --  34  --   --   ALT 47* 43  --  43  --   --   ALKPHOS 53 45  --  47  --   --   BILITOT 0.9 0.7  --  0.6  --   --   GFRNONAA >60 >60   < > >60 >60 >60  ANIONGAP 12 10   < > 9 7 10    < > = values in this interval not displayed.     Hematology Recent Labs  Lab 05/05/24 0246 05/06/24 0323 05/07/24 0239  WBC 8.6 10.1 8.9  RBC 4.13 4.05 3.70*  HGB 11.4* 11.1* 10.3*  HCT 34.8* 34.5* 32.1*  MCV 84.3 85.2 86.8  MCH 27.6 27.4 27.8  MCHC 32.8 32.2 32.1  RDW 15.9* 16.2* 16.1*  PLT 183 188 164    Cardiac EnzymesNo results  for input(s): TROPONINI in the last 168 hours. No results for input(s): TROPIPOC in the last 168 hours.   BNP Recent Labs  Lab 05/02/24 1454  PROBNP 3,347.0*     DDimer No results for input(s): DDIMER in the last 168 hours.   Radiology    No results found.  Cardiac Studies   Echo   Patient Profile     39 y.o. female with acute exacerbation of heart failure  Assessment & Plan    Acute on chronic heart failure with reduced EF Hx of hypertension  S/p Cholecystectomy   She needs IV Lasix  but may not be able to tolerate this due to current hypotension. Agree with holding entresto .  Hopeful the her blood pressure may improve to allow for appropriate duiresis. In the meantime it will be beneficial to get a right heart cath , will plan for tomorrow. Once that this completed base on her numbers she may benefit from Advanced heart failure evaluation.   Informed Consent   Shared Decision Making/Informed Consent The risks, including but not limited to, [bleeding or vascular complications (1 in 500), pneumothorax (1 in 1600), arrhythmia (1 in 1000) and death (1 in 5000)], benefits (diagnostic support and/or management of heart failure, pulmonary hypertension) and alternatives of a right heart catheterization were discussed in detail with Tara Bradshaw and she is willing to proceed.       For now it will be beneficial to get a lactic acid level  Thankfully cr is still normal.     For questions or updates, please contact [facility] Please consult [email or web address] for contact info under Cardiology/STEMI.      Signed, Malayla Granberry, DO  05/07/2024, 11:18 AM    "

## 2024-05-07 NOTE — Progress Notes (Signed)
 31 day ICM Remote transmission canceled due to [facility] clinic is on hold until further notice.  91 day remote monitoring will continue per protocol.

## 2024-05-07 NOTE — H&P (View-Only) (Signed)
 "  Progress Note  Patient Name: Tara Bradshaw Date of Encounter: 05/07/2024  Primary Cardiologist: Toribio Fuel, MD   Subjective   Patient seen and examined at her bedside.   Inpatient Medications    Scheduled Meds:  docusate sodium   100 mg Oral BID   empagliflozin   10 mg Oral Daily   furosemide   80 mg Oral Daily   gabapentin   300 mg Oral QHS   pantoprazole   40 mg Oral Daily   sacubitril -valsartan   1 tablet Oral BID   Continuous Infusions:  PRN Meds: acetaminophen  **OR** acetaminophen , meclizine , morphine  injection, ondansetron  (ZOFRAN ) IV, oxyCODONE    Vital Signs    Vitals:   05/06/24 2352 05/07/24 0300 05/07/24 0726 05/07/24 0948  BP: (!) 90/57 101/69 (!) 86/58 92/72  Pulse: 80 68 72   Resp: 16 18 18    Temp: 98.4 F (36.9 C) 97.7 F (36.5 C) 97.9 F (36.6 C)   TempSrc: Oral Oral Oral   SpO2: 99%  98%   Weight:  107.9 kg    Height:  5' 2 (1.575 m)      Intake/Output Summary (Last 24 hours) at 05/07/2024 1118 Last data filed at 05/07/2024 0300 Gross per 24 hour  Intake 240 ml  Output 1150 ml  Net -910 ml   Filed Weights   05/05/24 0426 05/06/24 0300 05/07/24 0300  Weight: 111.6 kg 109.3 kg 107.9 kg    Telemetry     - Personally Reviewed  ECG     - Personally Reviewed  Physical Exam    General: Comfortable Head: Atraumatic, normal size  Eyes: PEERLA, EOMI  Neck: Supple, normal JVD Cardiac: Normal S1, S2; RRR; no murmurs, rubs, or gallops Lungs: Clear to auscultation bilaterally Abd: Soft, nontender, no hepatomegaly  Ext: warm, no edema   Labs    Chemistry Recent Labs  Lab 05/02/24 1454 05/03/24 0237 05/04/24 0044 05/05/24 0246 05/06/24 0323 05/07/24 0239  NA 141 142   < > 138 139 140  K 3.4* 3.2*   < > 4.3 3.9 4.0  CL 103 105   < > 102 105 105  CO2 26 27   < > 26 27 25   GLUCOSE 92 110*   < > 118* 107* 93  BUN 10 9   < > 8 7 8   CREATININE 0.74 0.80   < > 0.71 0.73 0.80  CALCIUM  9.7 8.7*   < > 8.9 8.7* 8.2*  PROT 6.7 5.7*   --  6.0*  --   --   ALBUMIN 4.3 3.7  --  3.7  --   --   AST 35 29  --  34  --   --   ALT 47* 43  --  43  --   --   ALKPHOS 53 45  --  47  --   --   BILITOT 0.9 0.7  --  0.6  --   --   GFRNONAA >60 >60   < > >60 >60 >60  ANIONGAP 12 10   < > 9 7 10    < > = values in this interval not displayed.     Hematology Recent Labs  Lab 05/05/24 0246 05/06/24 0323 05/07/24 0239  WBC 8.6 10.1 8.9  RBC 4.13 4.05 3.70*  HGB 11.4* 11.1* 10.3*  HCT 34.8* 34.5* 32.1*  MCV 84.3 85.2 86.8  MCH 27.6 27.4 27.8  MCHC 32.8 32.2 32.1  RDW 15.9* 16.2* 16.1*  PLT 183 188 164    Cardiac EnzymesNo results  for input(s): TROPONINI in the last 168 hours. No results for input(s): TROPIPOC in the last 168 hours.   BNP Recent Labs  Lab 05/02/24 1454  PROBNP 3,347.0*     DDimer No results for input(s): DDIMER in the last 168 hours.   Radiology    No results found.  Cardiac Studies   Echo   Patient Profile     39 y.o. female with acute exacerbation of heart failure  Assessment & Plan    Acute on chronic heart failure with reduced EF Hx of hypertension  S/p Cholecystectomy   She needs IV Lasix  but may not be able to tolerate this due to current hypotension. Agree with holding entresto .  Hopeful the her blood pressure may improve to allow for appropriate duiresis. In the meantime it will be beneficial to get a right heart cath , will plan for tomorrow. Once that this completed base on her numbers she may benefit from Advanced heart failure evaluation.   Informed Consent   Shared Decision Making/Informed Consent The risks, including but not limited to, [bleeding or vascular complications (1 in 500), pneumothorax (1 in 1600), arrhythmia (1 in 1000) and death (1 in 5000)], benefits (diagnostic support and/or management of heart failure, pulmonary hypertension) and alternatives of a right heart catheterization were discussed in detail with Ms. Henken and she is willing to proceed.       For now it will be beneficial to get a lactic acid level  Thankfully cr is still normal.     For questions or updates, please contact [facility] Please consult [email or web address] for contact info under Cardiology/STEMI.      Signed, Malayla Granberry, DO  05/07/2024, 11:18 AM    "

## 2024-05-07 NOTE — TOC Transition Note (Signed)
 Transition of Care [facility]) - Discharge Note   Patient Details  Name: Tara Bradshaw MRN: [id number] Date of Birth: 07/09/1985  [facility]) CM/SW Contact:  Waddell Barnie Rama, RN Phone Number: 05/07/2024, 10:22 AM   Clinical Narrative:    For possible dc today, follow up apt on AVS.  She has transport.         Patient Goals and CMS Choice            Discharge Placement                       Discharge Plan and Services Additional resources added to the After Visit Summary for                                       Social Drivers of Health (SDOH) Interventions SDOH Screenings   Food Insecurity: No Food Insecurity (05/02/2024)  Housing: Low Risk (05/02/2024)  Transportation Needs: No Transportation Needs (05/02/2024)  Utilities: Not At Risk (05/02/2024)  Depression (PHQ2-9): Low Risk (03/02/2024)  Financial Resource Strain: Low Risk (02/28/2024)  Physical Activity: Inactive (03/02/2024)  Social Connections: Moderately Integrated (03/02/2024)  Stress: No Stress Concern Present (03/02/2024)  Tobacco Use: Low Risk (05/04/2024)  Health Literacy: Adequate Health Literacy (03/02/2024)     Readmission Risk Interventions     No data to display

## 2024-05-07 NOTE — Plan of Care (Signed)
 " Problem: Education: Goal: Knowledge of General Education information will improve Description: Including pain rating scale, medication(s)/side effects and non-pharmacologic comfort measures 05/07/2024 1816 by Jannie Julian, RN Outcome: Progressing 05/07/2024 1814 by Jannie Julian, RN Outcome: Progressing   Problem: Health Behavior/Discharge Planning: Goal: Ability to manage health-related needs will improve 05/07/2024 1816 by Jannie Julian, RN Outcome: Progressing 05/07/2024 1814 by Jannie Julian, RN Outcome: Progressing   Problem: Clinical Measurements: Goal: Ability to maintain clinical measurements within normal limits will improve 05/07/2024 1816 by Jannie Julian, RN Outcome: Progressing 05/07/2024 1814 by Jannie Julian, RN Outcome: Progressing Goal: Will remain free from infection 05/07/2024 1816 by Jannie Julian, RN Outcome: Progressing 05/07/2024 1814 by Jannie Julian, RN Outcome: Progressing Goal: Diagnostic test results will improve 05/07/2024 1816 by Jannie Julian, RN Outcome: Progressing 05/07/2024 1814 by Jannie Julian, RN Outcome: Progressing Goal: Respiratory complications will improve 05/07/2024 1816 by Jannie Julian, RN Outcome: Progressing 05/07/2024 1814 by Jannie Julian, RN Outcome: Progressing Goal: Cardiovascular complication will be avoided 05/07/2024 1816 by Jannie Julian, RN Outcome: Progressing 05/07/2024 1814 by Jannie Julian, RN Outcome: Progressing   Problem: Activity: Goal: Risk for activity intolerance will decrease 05/07/2024 1816 by Jannie Julian, RN Outcome: Progressing 05/07/2024 1814 by Jannie Julian, RN Outcome: Progressing   Problem: Nutrition: Goal: Adequate nutrition will be maintained 05/07/2024 1816 by Jannie Julian, RN Outcome: Progressing 05/07/2024 1814 by Jannie Julian, RN Outcome: Progressing   Problem: Coping: Goal: Level of anxiety will decrease 05/07/2024 1816 by Jannie Julian, RN Outcome:  Progressing 05/07/2024 1814 by Jannie Julian, RN Outcome: Progressing   Problem: Elimination: Goal: Will not experience complications related to bowel motility 05/07/2024 1816 by Jannie Julian, RN Outcome: Progressing 05/07/2024 1814 by Jannie Julian, RN Outcome: Progressing Goal: Will not experience complications related to urinary retention 05/07/2024 1816 by Jannie Julian, RN Outcome: Progressing 05/07/2024 1814 by Jannie Julian, RN Outcome: Progressing   Problem: Pain Managment: Goal: General experience of comfort will improve and/or be controlled 05/07/2024 1816 by Jannie Julian, RN Outcome: Progressing 05/07/2024 1814 by Jannie Julian, RN Outcome: Progressing   Problem: Safety: Goal: Ability to remain free from injury will improve 05/07/2024 1816 by Jannie Julian, RN Outcome: Progressing 05/07/2024 1814 by Jannie Julian, RN Outcome: Progressing   Problem: Skin Integrity: Goal: Risk for impaired skin integrity will decrease 05/07/2024 1816 by Jannie Julian, RN Outcome: Progressing 05/07/2024 1814 by Jannie Julian, RN Outcome: Progressing   Problem: Education: Goal: Knowledge of the prescribed therapeutic regimen will improve 05/07/2024 1816 by Jannie Julian, RN Outcome: Progressing 05/07/2024 1814 by Jannie Julian, RN Outcome: Progressing   Problem: Bowel/Gastric: Goal: Gastrointestinal status for postoperative course will improve 05/07/2024 1816 by Jannie Julian, RN Outcome: Progressing 05/07/2024 1814 by Jannie Julian, RN Outcome: Progressing   Problem: Cardiac: Goal: Ability to maintain an adequate cardiac output 05/07/2024 1816 by Jannie Julian, RN Outcome: Progressing 05/07/2024 1814 by Jannie Julian, RN Outcome: Progressing Goal: Will show no evidence of cardiac arrhythmias 05/07/2024 1816 by Jannie Julian, RN Outcome: Progressing 05/07/2024 1814 by Jannie Julian, RN Outcome: Progressing   Problem: Nutritional: Goal: Will attain and maintain  optimal nutritional status 05/07/2024 1816 by Jannie Julian, RN Outcome: Progressing 05/07/2024 1814 by Jannie Julian, RN Outcome: Progressing   Problem: Neurological: Goal: Will regain or maintain usual level of consciousness 05/07/2024 1816 by Jannie Julian, RN Outcome: Progressing 05/07/2024 1814 by Jannie Julian, RN Outcome: Progressing   Problem: Clinical Measurements: Goal: Ability to maintain clinical measurements within normal limits 05/07/2024 1816 by Jannie Julian, RN Outcome: Progressing 05/07/2024  1814 by Jannie Julian, RN Outcome: Progressing Goal: Postoperative complications will be avoided or minimized 05/07/2024 1816 by Jannie Julian, RN Outcome: Progressing 05/07/2024 1814 by Jannie Julian, RN Outcome: Progressing   Problem: Respiratory: Goal: Will regain and/or maintain adequate ventilation 05/07/2024 1816 by Jannie Julian, RN Outcome: Progressing 05/07/2024 1814 by Jannie Julian, RN Outcome: Progressing Goal: Respiratory status will improve 05/07/2024 1816 by Jannie Julian, RN Outcome: Progressing 05/07/2024 1814 by Jannie Julian, RN Outcome: Progressing   Problem: Skin Integrity: Goal: Demonstrates signs of wound healing without infection 05/07/2024 1816 by Jannie Julian, RN Outcome: Progressing 05/07/2024 1814 by Jannie Julian, RN Outcome: Progressing   Problem: Urinary Elimination: Goal: Will remain free from infection 05/07/2024 1816 by Jannie Julian, RN Outcome: Progressing 05/07/2024 1814 by Jannie Julian, RN Outcome: Progressing Goal: Ability to achieve and maintain adequate urine output 05/07/2024 1816 by Jannie Julian, RN Outcome: Progressing 05/07/2024 1814 by Jannie Julian, RN Outcome: Progressing   Problem: Education: Goal: Understanding of CV disease, CV risk reduction, and recovery process will improve 05/07/2024 1816 by Jannie Julian, RN Outcome: Progressing 05/07/2024 1814 by Jannie Julian, RN Outcome: Progressing Goal:  Individualized Educational Video(s) 05/07/2024 1816 by Jannie Julian, RN Outcome: Progressing 05/07/2024 1814 by Jannie Julian, RN Outcome: Progressing   Problem: Activity: Goal: Ability to return to baseline activity level will improve 05/07/2024 1816 by Jannie Julian, RN Outcome: Progressing 05/07/2024 1814 by Jannie Julian, RN Outcome: Progressing   Problem: Cardiovascular: Goal: Ability to achieve and maintain adequate cardiovascular perfusion will improve 05/07/2024 1816 by Jannie Julian, RN Outcome: Progressing 05/07/2024 1814 by Jannie Julian, RN Outcome: Progressing Goal: Vascular access site(s) Level 0-1 will be maintained 05/07/2024 1816 by Jannie Julian, RN Outcome: Progressing 05/07/2024 1814 by Jannie Julian, RN Outcome: Progressing   Problem: Health Behavior/Discharge Planning: Goal: Ability to safely manage health-related needs after discharge will improve 05/07/2024 1816 by Jannie Julian, RN Outcome: Progressing 05/07/2024 1814 by Jannie Julian, RN Outcome: Progressing   Problem: Education: Goal: Understanding of CV disease, CV risk reduction, and recovery process will improve Outcome: Progressing Goal: Individualized Educational Video(s) Outcome: Progressing   Problem: Activity: Goal: Ability to return to baseline activity level will improve Outcome: Progressing   Problem: Cardiovascular: Goal: Ability to achieve and maintain adequate cardiovascular perfusion will improve Outcome: Progressing Goal: Vascular access site(s) Level 0-1 will be maintained Outcome: Progressing   Problem: Health Behavior/Discharge Planning: Goal: Ability to safely manage health-related needs after discharge will improve Outcome: Progressing   "

## 2024-05-08 ENCOUNTER — Encounter (HOSPITAL_COMMUNITY)
Admission: EM | Disposition: A | Discharge status: Home / Self Care | Payer: Self-pay | Source: Home / Self Care | Attending: Internal Medicine

## 2024-05-08 ENCOUNTER — Encounter (HOSPITAL_COMMUNITY): Payer: Self-pay | Admitting: *Deleted

## 2024-05-08 ENCOUNTER — Other Ambulatory Visit (HOSPITAL_COMMUNITY): Payer: Self-pay

## 2024-05-08 ENCOUNTER — Telehealth (HOSPITAL_COMMUNITY): Payer: Self-pay

## 2024-05-08 DIAGNOSIS — K810 Acute cholecystitis: Secondary | ICD-10-CM | POA: Diagnosis not present

## 2024-05-08 LAB — BASIC METABOLIC PANEL WITH GFR
Anion gap: 9 (ref 5–15)
BUN: 9 mg/dL (ref 6–20)
CO2: 28 mmol/L (ref 22–32)
Calcium: 8.8 mg/dL — ABNORMAL LOW (ref 8.9–10.3)
Chloride: 101 mmol/L (ref 98–111)
Creatinine, Ser: 0.87 mg/dL (ref 0.44–1.00)
GFR, Estimated: >60 mL/min
Glucose, Bld: 93 mg/dL (ref 70–99)
Potassium: 4 mmol/L (ref 3.5–5.1)
Sodium: 137 mmol/L (ref 135–145)

## 2024-05-08 LAB — POCT I-STAT EG7
Acid-Base Excess: 4 mmol/L — ABNORMAL HIGH (ref 0.0–2.0)
Acid-Base Excess: 6 mmol/L — ABNORMAL HIGH (ref 0.0–2.0)
Bicarbonate: 29.7 mmol/L — ABNORMAL HIGH (ref 20.0–28.0)
Bicarbonate: 31.5 mmol/L — ABNORMAL HIGH (ref 20.0–28.0)
Calcium, Ion: 1.19 mmol/L (ref 1.15–1.40)
Calcium, Ion: 1.32 mmol/L (ref 1.15–1.40)
HCT: 36 % (ref 36.0–46.0)
HCT: 37 % (ref 36.0–46.0)
Hemoglobin: 12.2 g/dL (ref 12.0–15.0)
Hemoglobin: 12.6 g/dL (ref 12.0–15.0)
O2 Saturation: 51 %
O2 Saturation: 54 %
Potassium: 4.1 mmol/L (ref 3.5–5.1)
Potassium: 4.2 mmol/L (ref 3.5–5.1)
Sodium: 140 mmol/L (ref 135–145)
Sodium: 141 mmol/L (ref 135–145)
TCO2: 31 mmol/L (ref 22–32)
TCO2: 33 mmol/L — ABNORMAL HIGH (ref 22–32)
pCO2, Ven: 48.2 mmHg (ref 44–60)
pCO2, Ven: 50.4 mmHg (ref 44–60)
pH, Ven: 7.397 (ref 7.25–7.43)
pH, Ven: 7.404 (ref 7.25–7.43)
pO2, Ven: 28 mmHg — CL (ref 32–45)
pO2, Ven: 29 mmHg — CL (ref 32–45)

## 2024-05-08 LAB — COMPREHENSIVE METABOLIC PANEL WITH GFR
ALT: 32 [IU]/L (ref 0–32)
AST: 25 [IU]/L (ref 0–40)
Albumin: 3.4 g/dL — ABNORMAL LOW (ref 3.9–4.9)
Alkaline Phosphatase: 53 [IU]/L (ref 41–116)
BUN/Creatinine Ratio: 11 (ref 9–23)
BUN: 7 mg/dL (ref 6–24)
Bilirubin Total: 0.3 mg/dL (ref 0.0–1.2)
CO2: 21 mmol/L (ref 20–29)
Calcium: 8.4 mg/dL — ABNORMAL LOW (ref 8.7–10.2)
Chloride: 103 mmol/L (ref 96–106)
Creatinine, Ser: 0.65 mg/dL (ref 0.57–1.00)
Globulin, Total: 1.9 g/dL (ref 1.5–4.5)
Glucose: 83 mg/dL (ref 70–99)
Potassium: 4.1 mmol/L (ref 3.5–5.2)
Sodium: 139 mmol/L (ref 134–144)
Total Protein: 5.3 g/dL — ABNORMAL LOW (ref 6.0–8.5)
eGFR: 114 mL/min/{1.73_m2}

## 2024-05-08 LAB — BRAIN NATRIURETIC PEPTIDE: BNP: 745.8 pg/mL — ABNORMAL HIGH (ref 0.0–100.0)

## 2024-05-08 LAB — MAGNESIUM: Magnesium: 2 mg/dL (ref 1.7–2.4)

## 2024-05-08 MED ORDER — FENTANYL CITRATE (PF) 100 MCG/2ML IJ SOLN
INTRAMUSCULAR | Status: DC | PRN
  Administered 2024-05-08: 25 ug via INTRAVENOUS

## 2024-05-08 MED ORDER — SPIRONOLACTONE 25 MG PO TABS: 12.5000 mg | ORAL_TABLET | Freq: Every day | ORAL | Status: AC

## 2024-05-08 MED ORDER — FUROSCIX 80 MG/10ML ~~LOC~~ CTKT: 80.0000 mg | CARTRIDGE | Freq: Every day | SUBCUTANEOUS | Status: AC

## 2024-05-08 MED ORDER — TORSEMIDE 20 MG PO TABS
40.0000 mg | ORAL_TABLET | Freq: Every day | ORAL | 5 refills | Status: DC
  Filled 2024-05-08: qty 60, 30d supply, fill #0

## 2024-05-08 MED ORDER — HYDRALAZINE HCL 20 MG/ML IJ SOLN: 10.0000 mg | INTRAMUSCULAR | Status: DC | PRN

## 2024-05-08 MED ORDER — ENOXAPARIN SODIUM 40 MG/0.4ML IJ SOSY
40.0000 mg | PREFILLED_SYRINGE | INTRAMUSCULAR | Status: DC
  Administered 2024-05-08: 40 mg via SUBCUTANEOUS
  Filled 2024-05-08: qty 0.4

## 2024-05-08 MED ORDER — DIPHENHYDRAMINE HCL 50 MG/ML IJ SOLN
25.0000 mg | Freq: Four times a day (QID) | INTRAMUSCULAR | Status: DC | PRN
  Administered 2024-05-08: 25 mg via INTRAVENOUS
  Filled 2024-05-08: qty 1

## 2024-05-08 MED ORDER — FENTANYL CITRATE (PF) 100 MCG/2ML IJ SOLN
INTRAMUSCULAR | Status: AC
  Filled 2024-05-08: qty 2

## 2024-05-08 MED ORDER — SPIRONOLACTONE 12.5 MG HALF TABLET
12.5000 mg | ORAL_TABLET | Freq: Every day | ORAL | Status: DC
  Administered 2024-05-08: 12.5 mg via ORAL
  Filled 2024-05-08: qty 1

## 2024-05-08 MED ORDER — SODIUM CHLORIDE 0.9 % IV SOLN: 250.0000 mL | INTRAVENOUS | Status: DC | PRN

## 2024-05-08 MED ORDER — LABETALOL HCL 5 MG/ML IV SOLN: 10.0000 mg | INTRAVENOUS | Status: DC | PRN

## 2024-05-08 MED ORDER — ACETAMINOPHEN 325 MG PO TABS: 650.0000 mg | ORAL_TABLET | ORAL | Status: DC | PRN

## 2024-05-08 MED ORDER — HEPARIN (PORCINE) IN NACL 1000-0.9 UT/500ML-% IV SOLN
INTRAVENOUS | Status: DC | PRN
  Administered 2024-05-08: 1000 mL

## 2024-05-08 MED ORDER — FUROSEMIDE 10 MG/ML IJ SOLN
80.0000 mg | Freq: Two times a day (BID) | INTRAMUSCULAR | Status: DC
  Administered 2024-05-08: 80 mg via INTRAVENOUS
  Filled 2024-05-08: qty 8

## 2024-05-08 MED ORDER — LIDOCAINE HCL (PF) 1 % IJ SOLN
INTRAMUSCULAR | Status: AC
  Filled 2024-05-08: qty 30

## 2024-05-08 MED ORDER — POTASSIUM CHLORIDE CRYS ER 20 MEQ PO TBCR
40.0000 meq | EXTENDED_RELEASE_TABLET | Freq: Once | ORAL | Status: AC
  Administered 2024-05-08: 40 meq via ORAL
  Filled 2024-05-08: qty 2

## 2024-05-08 MED ORDER — LIDOCAINE HCL (PF) 1 % IJ SOLN
INTRAMUSCULAR | Status: DC | PRN
  Administered 2024-05-08: 5 mL via INTRADERMAL

## 2024-05-08 MED ORDER — DAPAGLIFLOZIN PROPANEDIOL 10 MG PO TABS
10.0000 mg | ORAL_TABLET | Freq: Every day | ORAL | 0 refills | Status: DC
  Filled 2024-05-08: qty 30, 30d supply, fill #0

## 2024-05-08 MED ORDER — SODIUM CHLORIDE 0.9% FLUSH
3.0000 mL | Freq: Two times a day (BID) | INTRAVENOUS | Status: DC
  Administered 2024-05-08: 3 mL via INTRAVENOUS

## 2024-05-08 MED ORDER — POTASSIUM CHLORIDE CRYS ER 20 MEQ PO TBCR
40.0000 meq | EXTENDED_RELEASE_TABLET | Freq: Every day | ORAL | 2 refills | Status: DC
  Filled 2024-05-08: qty 200, 100d supply, fill #0

## 2024-05-08 MED ORDER — FUROSEMIDE 10 MG/ML IJ SOLN
80.0000 mg | Freq: Once | INTRAMUSCULAR | Status: AC
  Administered 2024-05-08: 80 mg via INTRAVENOUS
  Filled 2024-05-08: qty 8

## 2024-05-08 MED ORDER — SODIUM CHLORIDE 0.9% FLUSH: 3.0000 mL | INTRAVENOUS | Status: DC | PRN

## 2024-05-08 NOTE — Consult Note (Addendum)
 "   Advanced Heart Failure Team Consult Note   Primary Physician: Lendia Boby CROME, NP-C Cardiologist:  Toribio Fuel, MD HPI:    Tara Bradshaw is seen today for evaluation of A/C HFrEF at the request of Dr. Fairy.   Samuel Hartfield is a 39 y.o. female w/ nonischemic cardiomyopathy s/p SICD, HTN and HFrEF diagnosed in 06/26/21 w/ LVEF 20-25% , and s/p barostim 10/23.   Admitted in 3/20 with acute systolic HF with EF 20-25% in setting of recent viral illness and 72-month post-partum status. COVID testing negative.   ZioPatch 8/20 NSR. Occasional PVCs (3.5%) 9-beat run NSVT     R/LHC 10/20: severe NICM EF 20%, Normal coronaries, Well compensated hemodynamics with high cardiac output.    CPX 12/21 FVC 2.68 (93%)      FEV1 2.27 (93%)        FEV1/FVC 85 (100%)        MVV 84 (84%)    Resting HR: 70 Standing HR: 70 Peak HR: 135   (73% age predicted max HR)  BP rest: 148/94 Standing BP: 126/92 BP peak: 166/84  Peak VO2: 14.9 (76% predicted peak VO2) When adjusted to the patient's ideal body weight of 127.7 lb (57.9 kg) the peak VO2 is 29.4 ml/kg (ibw)/min (91% of the ibw-adjusted predicted).  VE/VCO2 slope:  27  Peak RER: 0.95    Follow up 1/24, volume overloaded with worse NYHA III symptoms, despite 1 week of increased diuresis. Arranged for RHC which showed RA 13, PA 49/33 (41), Fick CO/CI 6.3/3, Thermo CO/CI 6.4/3, PVR 2.3, PAPi 2.5. Biventricular heart failure with elevated filling pressures. Preserved CO.    Zio (2/24) showed mostly NSR with 12.5% PVC burden, 45 runs of NSVT   Follow up 5/24, volume up and weight up, REDs 47%. Instructed to take metolazone  2.5/40 KCL x 2 days and increase torsemide  to 40 bid. Anemia panel showed iron deficient and arranged for iron infusion.   Admitted 8/24 with multifocal pneumonia.Negative SARS2.  Treated with antibiotics. HF meds cut back due to soft BP, Toprol  XL 12.5 mg daily. Metolazone  and spironolactone  stopped.    Echo 4/25: EF 20-25%,  mild to moderate RV dysfunction. MR is more eccentric, moderate to severe MR, mild to moderate TR  Admitted last week with acute cholecystitis. Underwent cholecystectomy 05/04/24. HF meds were initially held in the setting of upcoming procedure. Post-op course later complicated by A/C HFrEF. Echo with EF <20%, LV with GHK, LV mildly-mod dilated, GIIDD, RV mod reduced, mod-severe MR, mod TR. Was also not tolerating GDMT with dizziness and soft BP. RHC arranged today with RA 12, PCWP 25, PA 60/31 (44), CI 2.1, PA sat 51%. AHF team asked to see.   Very tearful this morning when I told her she needed to stay for further IV diuresis and GDMT initiation if possible. She's very ready to go home and tired of being admitted. Otherwise she denies CP/SOB. Denies dizziness recently.    Home Medications Prior to Admission medications  Medication Sig Start Date End Date Taking? Authorizing Provider  acetaminophen  (TYLENOL ) 325 MG tablet Take 650 mg by mouth every 6 (six) hours as needed for mild pain or headache.   Yes [provider]  benzonatate  (TESSALON ) 200 MG capsule Take 1 capsule (200 mg total) by mouth 2 (two) times daily as needed for cough. 04/04/24  Yes Blair, Diane W, FNP  ENTRESTO  97-103 MG TAKE 1 TABLET BY MOUTH TWICE  DAILY 04/15/24  Yes Cindie Ole DASEN, MD  furosemide  (LASIX ) 40 MG tablet Take 2 tablets (80 mg total) by mouth 2 (two) times daily. 10/22/23  Yes Milford, Harlene HERO, FNP  gabapentin  (NEURONTIN ) 300 MG capsule Take 1 capsule (300 mg total) by mouth at bedtime. 04/15/24  Yes Henson, Vickie L, NP-C  meclizine  (ANTIVERT ) 25 MG tablet Take 1 tablet (25 mg total) by mouth 3 (three) times daily as needed for dizziness. 03/22/23  Yes Bensimhon, Toribio SAUNDERS, MD  Multiple Vitamins-Minerals (WOMENS MULTIVITAMIN PO) Take 1 tablet by mouth daily.   Yes [provider]  NON FORMULARY Pt uses a cpap nightly   Yes [provider]  ondansetron  (ZOFRAN -ODT) 4 MG disintegrating  tablet Take 1 tablet (4 mg total) by mouth every 8 (eight) hours as needed for nausea or vomiting. 02/11/24  Yes Bensimhon, Toribio SAUNDERS, MD  pantoprazole  (PROTONIX ) 40 MG tablet Take 1 tablet (40 mg total) by mouth daily. 04/16/24  Yes Henson, Vickie L, NP-C  potassium chloride  SA (KLOR-CON  M) 20 MEQ tablet TAKE 2 TABLETS BY MOUTH DAILY 11/27/23  Yes Cindie Ole DASEN, MD  spironolactone  (ALDACTONE ) 25 MG tablet Take 1 tablet (25 mg total) by mouth daily. 11/20/23  Yes Milford, Harlene HERO, FNP  ZEPBOUND  5 MG/0.5ML Pen Inject 5 mg into the skin once a week. 04/03/24  Yes Maccia, Melissa D, RPH-CPP  FARXIGA  10 MG TABS tablet TAKE 1 TABLET BY MOUTH DAILY  BEFORE BREAKFAST Patient not taking: Reported on 05/04/2024 11/27/23   Cindie Ole DASEN, MD  levonorgestrel  (MIRENA ) 20 MCG/24HR IUD 1 each by Intrauterine route once.    [provider]  oxyCODONE  (OXY IR/ROXICODONE ) 5 MG immediate release tablet Take 1 tablet (5 mg total) by mouth every 4 (four) hours as needed for moderate pain (pain score 4-6). 05/05/24   Ebbie Cough, MD    Past Medical History: Past Medical History:  Diagnosis Date   AICD (automatic cardioverter/defibrillator) present    Allergy    Anemia    CHRONIC   CHF (congestive heart failure) (HCC)    Chronic systolic dysfunction of left ventricle    Dyspnea    GERD (gastroesophageal reflux disease)    WITH PREGNANCY   Nonischemic cardiomyopathy (HCC)    Pneumonia    x 1   PVC's (premature ventricular contractions)    Sleep apnea     Past Surgical History: Past Surgical History:  Procedure Laterality Date   CARDIAC CATHETERIZATION     CHOLECYSTECTOMY N/A 05/04/2024   Procedure: LAPAROSCOPIC CHOLECYSTECTOMY;  Surgeon: Ebbie Cough, MD;  Location: [facility] OR;  Service: General;  Laterality: N/A;   DILATION AND EVACUATION N/A 11/30/2016   Procedure: DILATATION AND EVACUATION;  Surgeon: Fredirick Glenys RAMAN, MD;  Location: [facility] ORS;  Service: Gynecology;  Laterality: N/A;    ICD IMPLANT N/A 02/19/2019   Procedure: ICD IMPLANT;  Surgeon: Kelsie Agent, MD;  Location: [facility] INVASIVE CV LAB;  Service: Cardiovascular;  Laterality: N/A;   INDOCYANINE GREEN  FLUORESCENCE IMAGING (ICG) N/A 05/04/2024   Procedure: INDOCYANINE GREEN  FLUORESCENCE IMAGING (ICG);  Surgeon: Ebbie Cough, MD;  Location: [facility] OR;  Service: General;  Laterality: N/A;   LEAD REVISION/REPAIR N/A 04/16/2019   Procedure: LEAD REVISION/REPAIR;  Surgeon: Kelsie Agent, MD;  Location: [facility] INVAS[facility] CV LAB;  Service: Cardiovascular;  Laterality: N/A;   RIGHT HEART CATH N/A 04/24/2022   Procedure: RIGHT HEART CATH;  Surgeon: Cherrie Toribio SAUNDERS, MD;  Location: [facility] CV LAB;  Service: Cardiovascular;  Laterality: N/A;   RIGHT/LEFT HEART CATH AND CORONARY ANGIOGRAPHY N/A 01/22/2019  Procedure: RIGHT/LEFT HEART CATH AND CORONARY ANGIOGRAPHY;  Surgeon: Cherrie Toribio SAUNDERS, MD;  Location: [facility];  Service: Cardiovascular;  Laterality: N/A;   WISDOM TOOTH EXTRACTION     NO ANESTHESIA    Family History: Family History  Problem Relation Age of Onset   Epilepsy Mother    Asthma Mother    Vision loss Mother    Heart disease Father    Breast cancer Maternal Aunt    Cancer Maternal Aunt 87       BREAST   Hyperlipidemia Maternal Aunt    Hearing loss Maternal Aunt    Hearing loss Maternal Uncle    Hypertension Maternal Grandmother    Hearing loss Maternal Grandmother    Kidney disease Maternal Grandmother    Cancer Maternal Grandfather    Hearing loss Maternal Grandfather    Breast cancer Paternal Grandmother    Cancer Paternal Grandmother        BREAST   Heart disease Paternal Grandfather     Social History: Social History   Socioeconomic History   Marital status: Widowed    Spouse name: Not on file   Number of children: Not on file   Years of education:  14   Highest education level: Associate degree: occupational, scientist, product/process development, or vocational program  Occupational History    Occupation: SECURITY OFFICER    Employer: [employer]  Tobacco Use   Smoking status: Never    Passive exposure: Never   Smokeless tobacco: Never  Vaping Use   Vaping status: Never Used  Substance and Sexual Activity   Alcohol use: No   Drug use: No   Sexual activity: Yes    Partners: Male    Birth control/protection: I.U.D.  Other Topics Concern   Not on file  Social History Narrative   Lives in [place] with husband and children (6,1)   Unemployed    Previously worked in office manager.   Social Drivers of Health   Tobacco Use: Low Risk (05/04/2024)   Patient History    Smoking Tobacco Use: Never    Smokeless Tobacco Use: Never    Passive Exposure: Never  Financial Resource Strain: Low Risk (02/28/2024)   Overall Financial Resource Strain (CARDIA)    Difficulty of Paying Living Expenses: Not hard at all  Food Insecurity: No Food Insecurity (05/02/2024)   Epic    Worried About Programme Researcher, Broadcasting/film/video in the Last Year: Never true    Ran Out of Food in the Last Year: Never true  Transportation Needs: No Transportation Needs (05/02/2024)   Epic    Lack of Transportation (Medical): No    Lack of Transportation (Non-Medical): No  Physical Activity: Inactive (03/02/2024)   Exercise Vital Sign    Days of Exercise per Week: 0 days    Minutes of Exercise per Session: 0 min  Stress: No Stress Concern Present (03/02/2024)   [employer] of Occupational Health - Occupational Stress Questionnaire    Feeling of Stress: Not at all  Social Connections: Moderately Integrated (03/02/2024)   Social Connection and Isolation Panel    Frequency of Communication with Friends and Family: More than three times a week    Frequency of Social Gatherings with Friends and Family: More than three times a week    Attends Religious Services: More than 4 times per year    Active Member of [employer] or Organizations: Yes    Attends Banker Meetings: More than 4 times per year  Marital Status: Widowed  Depression (PHQ2-9): Low Risk (03/02/2024)   Depression (PHQ2-9)    PHQ-2 Score: 0  Alcohol Screen: Not on file  Housing: Low Risk (05/02/2024)   Epic    Unable to Pay for Housing in the Last Year: No    Number of Times Moved in the Last Year: 0    Homeless in the Last Year: No  Utilities: Not At Risk (05/02/2024)   Epic    Threatened with loss of utilities: No  Health Literacy: Adequate Health Literacy (03/02/2024)   B1300 Health Literacy    Frequency of need for help with medical instructions: Never    Allergies:  Allergies[1]  Objective:   Vital Signs:   Temp:  [97.8 F (36.6 C)-98.2 F (36.8 C)] 97.8 F (36.6 C) (01/30 0849) Pulse Rate:  [68-83] 83 (01/30 0956) Resp:  [16-18] 16 (01/30 0849) BP: (89-108)/(57-83) 107/78 (01/30 1100) SpO2:  [97 %-100 %] 100 % (01/30 0910) Weight:  [107.6 kg] 107.6 kg (01/30 0443) Last BM Date : 05/02/24  Weight change: Filed Weights   05/07/24 0300 05/07/24 2219 05/08/24 0443  Weight: 107.9 kg 107.6 kg 107.6 kg    Intake/Output:   Intake/Output Summary (Last 24 hours) at 05/08/2024 1134 Last data filed at 05/08/2024 0609 Gross per 24 hour  Intake 240 ml  Output 4000 ml  Net -3760 ml    Physical Exam  General:  well appearing.   Cor: Regular rate & rhythm. No murmurs. JVD difficult to see, ~8/9 cm.  Lungs: clear, diminished bases Extremities: non-pitting BLE edema, warm   Telemetry   NSR 80s with 8-20 PVCs/hr (Personally reviewed)    Labs   Basic Metabolic Panel: Recent Labs  Lab 05/02/24 1521 05/03/24 0237 05/04/24 0044 05/05/24 0246 05/06/24 0323 05/07/24 0239 05/08/24 0254  NA  --    < > 140 138 139  139 140 137  K  --    < > 4.0 4.3 4.1  3.9 4.0 4.0  CL  --    < > 104 102 103  105 105 101  CO2  --    < > 28 26 21  27 25 28   GLUCOSE  --    < > 99 118* 107*  83 93 93  BUN  --    < > 8 8 7  7 8 9   CREATININE  --    < > 0.88 0.71 0.65  0.73 0.80 0.87  CALCIUM   --    < > 8.4*  8.9 8.4*  8.7* 8.2* 8.8*  MG 1.7  --   --   --  2.0  --  2.0   < > = values in this interval not displayed.    Liver Function Tests: Recent Labs  Lab 05/02/24 1454 05/03/24 0237 05/05/24 0246 05/06/24 0323  AST 35 29 34 25  ALT 47* 43 43 32  ALKPHOS 53 45 47 53  BILITOT 0.9 0.7 0.6 0.3  PROT 6.7 5.7* 6.0* 5.3*  ALBUMIN 4.3 3.7 3.7 3.4*   Recent Labs  Lab 05/02/24 1454  LIPASE 17   No results for input(s): AMMONIA in the last 168 hours.  CBC: Recent Labs  Lab 05/02/24 1454 05/03/24 0237 05/05/24 0246 05/06/24 0323 05/07/24 0239  WBC 6.5 5.7 8.6 10.1 8.9  HGB 11.1* 10.1* 11.4* 11.1* 10.3*  HCT 35.0* 30.9* 34.8* 34.5* 32.1*  MCV 85.8 84.2 84.3 85.2 86.8  PLT 165 165 183 188 164    Cardiac Enzymes: No  results for input(s): CKTOTAL, CKMB, CKMBINDEX, TROPONINI in the last 168 hours.  BNP: BNP (last 3 results) Recent Labs    06/14/23 1254 10/22/23 1202 05/06/24 0323  BNP 791.5* 774.2* 745.8*    ProBNP (last 3 results) Recent Labs    05/02/24 1454  PROBNP 3,347.0*     CBG: No results for input(s): GLUCAP in the last 168 hours.  Coagulation Studies: No results for input(s): LABPROT, INR in the last 72 hours.   Imaging   CARDIAC CATHETERIZATION Result Date: 05/08/2024 1.  Fick cardiac output of 4.25 L/min and Fick cardiac index of 2.06 L/min/m with the following hemodynamics:  Right atrial pressure mean of 12 mmHg  Right ventricular pressure 60/5 with end-diastolic pressure of 13 mmHg  Wedge pressure mean of 25 mmHg with V waves to 37 mmHg  PA pressure of 60/31 with a mean of 44 mmHg  PVR 4.5 Woods units  PA pulsatility index of 2.4  PA saturation 51% Summary: Elevated filling pressures with mildly reduced cardiac output and index.    Medications:   Current Medications:  docusate sodium   100 mg Oral BID   empagliflozin   10 mg Oral Daily   furosemide   80 mg Intravenous BID   gabapentin   300 mg Oral QHS   pantoprazole   40 mg Oral  Daily   sodium chloride  flush  3 mL Intravenous Q12H    Infusions:  sodium chloride      Patient Profile   Tara Bradshaw is a 39 y.o. female w/ nonischemic cardiomyopathy s/p SICD, HTN and HFrEF diagnosed in 06/26/21 w/ LVEF 20-25% , and s/p barostim 10/23. Admitted with acute cholecystitis, now s/p cholecystectomy. Post-op course complicated by A/C HFrEF.   Assessment/Plan  Chronic HFrEF:  - Diagnosed 3/20. Echo LVEF 25% with moderate RV dysfunction - NICM. Either viral (had URI in week prior to admission and respiratory panel + rhinovirus) or post-partum or PVC cardiomyopathy.  - LHC 10/20 showed normal cors. RHC showed well compensated hemodynamics with high cardiac output. No evidence of intracardiac shunting.  - Echo 10/20 EF 25-30% - Echo 12/21 EF 25-30% - Echo 10/09/21 EF 25-30%  - CPX 1/22 Peak VO2: 14.9 (76% predicted peak VO2) adjusted to ibw pVO2 is 29.4 ml/kg (ibw)/min (91% of the ibw-adjusted predicted). VE/VCO2 slope:  27 - S/p barostim 10/23 - RHC (1/24): RA 13, PA 49/33 (41), Fick CO/CI 6.3/3, Thermo CO/CI 6.4/3, PVR 2.3, PAPi 2.5. Biventricular heart failure with elevated filling pressures. Preserved CO.  - Zio 2/24 NSR avg HR 82 bpm, 45 runs NSVT (longest 8 beats), 2 runs SVT (5 beats max), 12.5% PVCs - Echo 4/25: EF 20-25%, mild to moderate RV dysfunction. MR is more eccentric, moderate to severe MR, mild to moderate TR - Echo with EF <20%, LV with GHK, LV mildly-mod dilated, GIIDD, RV mod reduced, mod-severe MR, mod TR - RHC today with RA 12, PCWP 25, PA 60/31 (44), CI 2.1, PA sat 51 - NYHA II-IIIa on assessment.  - Volume does not appear significantly elevated on exam. Warm to touch. RHC numbers from today with high filling pressures. -4L yesterday with 80 PO lasix  yesterday. Agree with 80 IV BID today. Will check a ReDS clip for a baseline for today. Weight lowest its been in a while.  - Appears well perfused on exam, warm to touch. Will hold off on inotrope's at this  time.  - Continue Jardiance  10 mg daily - On minimal GDMT in the setting of volume overload and hypotension.  -  CPX 8/25 with mild-mod limitation/ major contributor to be obesity. There was also mild-mod HF limitation.  - Place TED hose.    HTN - BP soft, has not been tolerating GDMT.    PVCs  - Zio (2/24) showed 12.5 % PVC burden - Amio stopped 7/23 as PVC suppression did not improve EF, and favor not exposing her to long-term side effects   Valvular disease - Echo 4/25 with moderate to severe MR & mild to moderate TR - Echo this admission with mod-severe MR and mod TR - Follow   OSA - Moderate on sleep study (4/20) - Continue CPAP  Acute cholecystitis - On admission. Now s/p cholecystectomy - Surgery has signed off, stable from surgical standpoint.   Addendum 05/08/24 1:37 PM  Stable for discharge from AHF standpoint with close follow up in clinic. ReDS clip 51% but asymptomatic and ready to go home. Will give 160 IV lasix  prior to discharge and send her home with Furoscix  to use Saturday and Sunday. Plan to start Torsemide  40 mg daily on Monday. Will also start KDUR 40 mEq at discharge daily. No Torsemide  while using Furoscix . Plan discussed with Dr. Zenaida, Dr. Fairy made aware.   AHF meds at discharge: (please send meds to [facility]) Jardiance  10 mg daily Torsemide  40 mg daily (start 05/11/24) KDUR 40 mEq daily (start 05/09/24) Spiro 12.5 mg daily  FUROSCIX  prescribed  Patient viewed patient education video with QR code for FUROSCIX    QR code for FUROSCIX  placed on AVS  Call FUROSCIX  Direct at [phone] for questions regarding on body infuser.  Will arrange close follow up next week in clinic. Personally discussed with patient and family at the bedside.   Length of Stay: 5  Beckey LITTIE Coe, NP  05/08/2024, 11:34 AM  Advanced Heart Failure Team Pager [phone] (M-F; 7a - 5p)   Please visit [email or web address]: For overnight coverage please call cardiology fellow first. If fellow  not available call Shock/ECMO MD on call.  For ECMO / Mechanical Support (Impella, IABP, LVAD) issues call Shock / ECMO MD on call.       [1] No Known Allergies  "

## 2024-05-08 NOTE — Progress Notes (Signed)
 "  Rounding Note   Patient Name: Tara Bradshaw Date of Encounter: 05/08/2024  [facility] Cardiologist: Toribio Fuel, MD   Subjective BP 102/67. Cr 0.87.  Denies chest pain, reports dyspnea improving  Scheduled Meds:  docusate sodium   100 mg Oral BID   empagliflozin   10 mg Oral Daily   furosemide   80 mg Oral Daily   gabapentin   300 mg Oral QHS   pantoprazole   40 mg Oral Daily   sacubitril -valsartan   1 tablet Oral BID   Continuous Infusions:  PRN Meds: acetaminophen  **OR** acetaminophen , diphenhydrAMINE , meclizine , morphine  injection, ondansetron  (ZOFRAN ) IV, oxyCODONE    Vital Signs  Vitals:   05/08/24 0402 05/08/24 0443 05/08/24 0849 05/08/24 0910  BP: (!) 95/57  102/67   Pulse: 68     Resp: 17  16   Temp: 97.8 F (36.6 C)  97.8 F (36.6 C)   TempSrc: Oral  Oral   SpO2: 98%  100% 100%  Weight:  107.6 kg    Height:        Intake/Output Summary (Last 24 hours) at 05/08/2024 0953 Last data filed at 05/08/2024 9390 Gross per 24 hour  Intake 480 ml  Output 4000 ml  Net -3520 ml      05/08/2024    4:43 AM 05/07/2024   10:19 PM 05/07/2024    3:00 AM  Last 3 Weights  Weight (lbs) 237 lb 3.4 oz 237 lb 3.4 oz 237 lb 14 oz  Weight (kg) 107.6 kg 107.6 kg 107.9 kg      Telemetry NSR, NSVT up to 10 beats - Personally Reviewed  ECG  No new ECG - Personally Reviewed  Physical Exam  GEN: No acute distress.   Neck: + JVD Cardiac: RRR, no murmurs, rubs, or gallops.  Respiratory: Clear to auscultation bilaterally. GI: Soft, nontender, non-distended  MS: No edema; No deformity. Neuro:  Nonfocal  Psych: Normal affect   Labs High Sensitivity Troponin:  No results for input(s): TROPONINIHS in the last 720 hours.  Recent Labs  Lab 05/02/24 1454  TRNPT 6       Chemistry Recent Labs  Lab 05/02/24 1521 05/03/24 0237 05/04/24 0044 05/05/24 0246 05/06/24 0323 05/07/24 0239 05/08/24 0254  NA  --  142   < > 138 139  139 140 137  K  --  3.2*   < >  4.3 4.1  3.9 4.0 4.0  CL  --  105   < > 102 103  105 105 101  CO2  --  27   < > 26 21  27 25 28   GLUCOSE  --  110*   < > 118* 107*  83 93 93  BUN  --  9   < > 8 7  7 8 9   CREATININE  --  0.80   < > 0.71 0.65  0.73 0.80 0.87  CALCIUM   --  8.7*   < > 8.9 8.4*  8.7* 8.2* 8.8*  MG 1.7  --   --   --  2.0  --  2.0  PROT  --  5.7*  --  6.0* 5.3*  --   --   ALBUMIN  --  3.7  --  3.7 3.4*  --   --   AST  --  29  --  34 25  --   --   ALT  --  43  --  43 32  --   --   ALKPHOS  --  45  --  47 53  --   --   BILITOT  --  0.7  --  0.6 0.3  --   --   GFRNONAA  --  >60   < > >60 >60 >60 >60  ANIONGAP  --  10   < > 9 7 10 9    < > = values in this interval not displayed.    Lipids No results for input(s): CHOL, TRIG, HDL, LABVLDL, LDLCALC, CHOLHDL in the last 168 hours.  Hematology Recent Labs  Lab 05/05/24 0246 05/06/24 0323 05/07/24 0239  WBC 8.6 10.1 8.9  RBC 4.13 4.05 3.70*  HGB 11.4* 11.1* 10.3*  HCT 34.8* 34.5* 32.1*  MCV 84.3 85.2 86.8  MCH 27.6 27.4 27.8  MCHC 32.8 32.2 32.1  RDW 15.9* 16.2* 16.1*  PLT 183 188 164   Thyroid  No results for input(s): TSH, FREET4 in the last 168 hours.  BNP Recent Labs  Lab 05/02/24 1454 05/06/24 0323  BNP  --  745.8*  PROBNP 3,347.0*  --     DDimer No results for input(s): DDIMER in the last 168 hours.   Radiology  CARDIAC CATHETERIZATION Result Date: 05/08/2024 1.  Fick cardiac output of 4.25 L/min and Fick cardiac index of 2.06 L/min/m with the following hemodynamics:  Right atrial pressure mean of 12 mmHg  Right ventricular pressure 60/5 with end-diastolic pressure of 13 mmHg  Wedge pressure mean of 25 mmHg with V waves to 37 mmHg  PA pressure of 60/31 with a mean of 44 mmHg  PVR 4.5 Woods units  PA pulsatility index of 2.4  PA saturation 51% Summary: Elevated filling pressures with mildly reduced cardiac output and index.    Cardiac Studies   Patient Profile   39 y.o. female with nonischemic cardiomyopathy  status post SICD, systolic heart failure diagnosed 06/2021 with LVEF 20 to 25% status post Barostim 01/2022, hypertension presented with acute cholecystitis status post lap chole on 1/26  Assessment & Plan   Acute on chronic combined heart failure: Diagnosed 06/2021 with LVEF 20 to 25%.  LHC 01/2019 showed normal coronaries well compensated hemodynamics.  Status post SICD.  Status post Barostim 01/2022.  Presented with acute cholecystitis, now appears in decompensated heart failure.  Echo this admit with EF<20%. RHC today wit RA 12, RV 60/5, PCWP 25, PA 60/31/44, CI 2.1, PA sat 51% -RHC suggests she is cold and wet.  She has been on PO lasix , will switch to IV lasix  and consult Advanced Heart Failure, may need inotropic support -Entresto  has been held due to soft BP  Acute cholecystitis: Status post lap chole on 1/26.  Doing well from surgical standpoint, general surgery has signed off  Morbid obesity: Body mass index is 43.39 kg/m.  For questions or updates, please contact [facility] Please consult [email or web address] for contact info under     Signed, Lonni LITTIE Nanas, MD  05/08/2024, 9:53 AM    "

## 2024-05-08 NOTE — Interval H&P Note (Signed)
 History and Physical Interval Note:  05/08/2024 6:49 AM  Tara Bradshaw  has presented today for surgery, with the diagnosis of chf.  The various methods of treatment have been discussed with the patient and family. After consideration of risks, benefits and other options for treatment, the patient has consented to  Procedures: RIGHT HEART CATH (N/A) as a surgical intervention.  The patient's history has been reviewed, patient examined, no change in status, stable for surgery.  I have reviewed the patient's chart and labs.  Questions were answered to the patient's satisfaction.     Frederika Hukill K Sherlie Boyum

## 2024-05-08 NOTE — Telephone Encounter (Signed)
 Pharmacy Patient Advocate Encounter  Insurance verification completed.    The patient is insured through [employer]. Patient has Medicare and is not eligible for a copay card, but may be able to apply for patient assistance or Medicare RX Payment Plan (Patient Must reach out to their plan, if eligible for payment plan), if available.    Ran test claim for Jardiance  10mg  tablet and the current 30 day co-pay is $4.90.   This test claim was processed through [facility]- copay amounts may vary at other pharmacies due to pharmacy/plan contracts, or as the patient moves through the different stages of their insurance plan.

## 2024-05-08 NOTE — Progress Notes (Signed)
 REDS Clip  READING (normal 20-35%) = 51 %  CHEST RULER (in) =28 Clip Station =  A  Patient tolerated Reds reading well. Updated both Beckey Coe, NP and Dr. Zenaida of patients reading.   Stephane Haddock, BSN, Scientist, Clinical (histocompatibility And Immunogenetics) Only

## 2024-05-08 NOTE — Discharge Summary (Addendum)
 Physician Discharge Summary  Tara Bradshaw FMW:[id number] DOB: [birth year] DOA: 05/02/2024  PCP: Lendia Boby CROME, NP-C  Admit date: 05/02/2024 Discharge date: 05/08/2024  Time spent: 45 minutes  Recommendations for Outpatient Follow-up:  General Surgery, postop follow-up on 2/19 Advanced heart failure clinic next week, check BMP at follow-up   Discharge Diagnoses:  Principal Problem:   Acute cholecystitis Acute on chronic systolic CHF, severe BiV failure Severe pulmonary hypertension Moderate to severe mitral regurgitation   Morbid obesity (HCC)   GERD (gastroesophageal reflux disease)   Nonischemic cardiomyopathy (HCC)   ICD (implantable cardioverter-defibrillator) in place   Acute on chronic combined systolic and diastolic CHF (congestive heart failure) (HCC)   Primary hypertension   Discharge Condition: improved  Diet recommendation: heart healthy  Filed Weights   05/07/24 0300 05/07/24 2219 05/08/24 0443  Weight: 107.9 kg 107.6 kg 107.6 kg    History of present illness:  38/F with chronic systolic CHF, NICM, AICD, GERD, PVCs presented to [facility] with upper abdominal pain - Further workup she was noted to have acute cholecystitis - Seen by general surgery in consultation, underwent lap chole on 1/26 - Postop course complicated by CHF exacerbation, cards following, slowly restarting GDMT, limited by BP - RHC RA 12 wedge 25, CI 2.1  Hospital Course:   Acute cholecystitis - Status post lap chole on 1/26 - Slowly improving - Cleared by general surgery for discharge home, follow-up arranged   Acute on chronic systolic CHF, severe BiV failure Severe pulmonary hypertension Moderate to severe mitral regurgitation - Last echo 1/24 noted EF< 20%, grade 2 DD, moderately reduced RV, severely elevated PASP, mitral regurgitation - Cardiology following, improving with diuresis - Blood pressure still soft but overall improving, RHC today with RA 12 wedge 25, cardiac  index 2.1 - She is slightly volume up but extremely anxious to go home, tearful today, seen by heart failure team and recommended Furoscix  Saturday and Sunday and then resume torsemide  on Monday -Entresto  discontinued at this time in the setting of hypotension -Farxiga  continued, Aldactone  dose decreased to 12.5 mg -Follow-up with heart failure team next week   Moderate to severe mitral regurgitation - Needs follow-up   Obesity (HCC) - on Zepbound    ICD (implantable cardioverter-defibrillator) in place Noted.   GERD (gastroesophageal reflux disease) Resume PPI  Discharge Exam: Vitals:   05/08/24 1320 05/08/24 1400  BP: 105/82 99/80  Pulse:    Resp:    Temp:    SpO2: 99%    General exam: Pleasant, AO x 3, no distress HEENT positive JVD Respiratory system: Decreased breath sounds to bases Cardiovascular system: S1 & S2 heard, RRR.  Systolic murmur Abd: Soft, abdomen mildly tender, incisional sites unremarkable Central nervous system: Alert and oriented. No focal neurological deficits. Extremities: Trace edema Skin: No rashes Psychiatry:  Mood & affect appropriate.     Discharge Instructions    Allergies as of 05/08/2024   No Known Allergies      Medication List     STOP taking these medications    Entresto  97-103 MG Generic drug: sacubitril -valsartan    furosemide  40 MG tablet Commonly known as: LASIX        TAKE these medications    acetaminophen  325 MG tablet Commonly known as: TYLENOL  Take 650 mg by mouth every 6 (six) hours as needed for mild pain or headache.   benzonatate  200 MG capsule Commonly known as: TESSALON  Take 1 capsule (200 mg total) by mouth 2 (two) times daily as needed for cough.  Farxiga  10 MG Tabs tablet Generic drug: dapagliflozin  propanediol TAKE 1 TABLET BY MOUTH DAILY  BEFORE BREAKFAST   gabapentin  300 MG capsule Commonly known as: NEURONTIN  Take 1 capsule (300 mg total) by mouth at bedtime.   levonorgestrel  20  MCG/24HR IUD Commonly known as: MIRENA  1 each by Intrauterine route once.   meclizine  25 MG tablet Commonly known as: ANTIVERT  Take 1 tablet (25 mg total) by mouth 3 (three) times daily as needed for dizziness.   NON FORMULARY Pt uses a cpap nightly   ondansetron  4 MG disintegrating tablet Commonly known as: ZOFRAN -ODT Take 1 tablet (4 mg total) by mouth every 8 (eight) hours as needed for nausea or vomiting.   oxyCODONE  5 MG immediate release tablet Commonly known as: Oxy IR/ROXICODONE  Take 1 tablet (5 mg total) by mouth every 4 (four) hours as needed for moderate pain (pain score 4-6).   pantoprazole  40 MG tablet Commonly known as: PROTONIX  Take 1 tablet (40 mg total) by mouth daily.   potassium chloride  SA 20 MEQ tablet Commonly known as: KLOR-CON  M Take 2 tablets (40 mEq total) by mouth daily. Start taking on: May 09, 2024   spironolactone  25 MG tablet Commonly known as: ALDACTONE  Take 0.5 tablets (12.5 mg total) by mouth daily. What changed: how much to take   torsemide  20 MG tablet Commonly known as: DEMADEX  Take 2 tablets (40 mg total) by mouth daily. Start taking on: May 11, 2024   WOMENS MULTIVITAMIN PO Take 1 tablet by mouth daily.   Zepbound  5 MG/0.5ML Pen Generic drug: tirzepatide  Inject 5 mg into the skin once a week.       Allergies[1]  Follow-up Information     Maczis, Puja Gosai, PA-C Follow up on 05/28/2024.   Specialty: General Surgery Why: 1:45 pm, Arrive 30 minutes prior to your appointment time, Please bring your insurance card and photo ID Contact information: [address] [facility] [address] [place] [address] [facility] [place] [facility] [place] [address] [phone]         Lendia Boby CROME, NP-C Follow up on 05/14/2024.   Specialty: Family Medicine Why: 1:40 for hospital follow up Contact information: [address] [place] [address] [phone]                  The results of significant diagnostics from this  hospitalization (including imaging, microbiology, ancillary and laboratory) are listed below for reference.    Significant Diagnostic Studies: CARDIAC CATHETERIZATION Result Date: 05/08/2024 1.  Fick cardiac output of 4.25 L/min and Fick cardiac index of 2.06 L/min/m with the following hemodynamics:  Right atrial pressure mean of 12 mmHg  Right ventricular pressure 60/5 with end-diastolic pressure of 13 mmHg  Wedge pressure mean of 25 mmHg with V waves to 37 mmHg  PA pressure of 60/31 with a mean of 44 mmHg  PVR 4.5 Woods units  PA pulsatility index of 2.4  PA saturation 51% Summary: Elevated filling pressures with mildly reduced cardiac output and index.   CUP PACEART REMOTE DEVICE CHECK Result Date: 05/05/2024 ICD Scheduled remote reviewed. Normal device function.  Presenting rhythm:  Regular VS with ectopy Multiple brief NSVT, ventricular rates 174-289, no therapy HF diagnostics frequently abnormal this remote cycle Next remote transmission per protocol. LA, CVRS  ECHOCARDIOGRAM COMPLETE Result Date: 05/03/2024    ECHOCARDIOGRAM REPORT   Patient Name:   Tara Bradshaw Date of Exam: 05/03/2024 Medical Rec #:  [id number]      Height:       62.0  in Accession #:    [id number]     Weight:       238.2 lb Date of Birth:  [birth year]      BSA:          2.059 m Patient Age:    38 years       BP:           110/81 mmHg Patient Gender: F              HR:           84 bpm. Exam Location:  Inpatient Procedure: 2D Echo (Both Spectral and Color Flow Doppler were utilized during            procedure). Indications:    nonischemic cardiomyopathy  History:        Patient has prior history of Echocardiogram examinations, most                 recent 07/29/2023. Cardiomyopathy; Defibrillator.  Sonographer:    Tinnie Barefoot RDCS Referring Phys: [id number] KELLY A GRIFFITH IMPRESSIONS  1. Global hypokinesis with akinesis of the inferior wall. Left ventricular ejection fraction, by estimation, is <20%. The left ventricle has  severely decreased function. The left ventricle demonstrates global hypokinesis. The left ventricular internal cavity size was mildly to moderately dilated. Left ventricular diastolic parameters are consistent with Grade II diastolic dysfunction (pseudonormalization).  2. Right ventricular systolic function is moderately reduced. The right ventricular size is normal. There is severely elevated pulmonary artery systolic pressure. The estimated right ventricular systolic pressure is 65.7 mmHg.  3. Left atrial size was severely dilated.  4. Right atrial size was moderately dilated.  5. The mitral valve is normal in structure. Moderate to severe mitral valve regurgitation. No evidence of mitral stenosis.  6. Tricuspid valve regurgitation is moderate.  7. The aortic valve is normal in structure. Aortic valve regurgitation is not visualized. No aortic stenosis is present.  8. The inferior vena cava is normal in size with greater than 50% respiratory variability, suggesting right atrial pressure of 3 mmHg. FINDINGS  Left Ventricle: Global hypokinesis with akinesis of the inferior wall. Left ventricular ejection fraction, by estimation, is <20%. The left ventricle has severely decreased function. The left ventricle demonstrates global hypokinesis. The left ventricular internal cavity size was mildly to moderately dilated. There is no left ventricular hypertrophy. Left ventricular diastolic parameters are consistent with Grade II diastolic dysfunction (pseudonormalization). Right Ventricle: The right ventricular size is normal. No increase in right ventricular wall thickness. Right ventricular systolic function is moderately reduced. There is severely elevated pulmonary artery systolic pressure. The tricuspid regurgitant velocity is 3.56 m/s, and with an assumed right atrial pressure of 15 mmHg, the estimated right ventricular systolic pressure is 65.7 mmHg. Left Atrium: Left atrial size was severely dilated. Right Atrium:  Right atrial size was moderately dilated. Pericardium: There is no evidence of pericardial effusion. Mitral Valve: The mitral valve is normal in structure. Moderate to severe mitral valve regurgitation, with posteriorly-directed jet. No evidence of mitral valve stenosis. Tricuspid Valve: The tricuspid valve is normal in structure. Tricuspid valve regurgitation is moderate . No evidence of tricuspid stenosis. Aortic Valve: The aortic valve is normal in structure. Aortic valve regurgitation is not visualized. No aortic stenosis is present. Pulmonic Valve: The pulmonic valve was normal in structure. Pulmonic valve regurgitation is mild. No evidence of pulmonic stenosis. Aorta: The aortic root is normal in size and structure. Venous: The inferior vena cava is normal in  size with greater than 50% respiratory variability, suggesting right atrial pressure of 3 mmHg. IAS/Shunts: No atrial level shunt detected by color flow Doppler. Additional Comments: A device lead is visualized in the right ventricle.  LEFT VENTRICLE PLAX 2D LVIDd:         6.20 cm      Diastology LVIDs:         6.00 cm      LV e' medial:    4.24 cm/s LV PW:         1.10 cm      LV E/e' medial:  19.2 LV IVS:        1.00 cm      LV e' lateral:   7.62 cm/s LVOT diam:     2.30 cm      LV E/e' lateral: 10.7 LV SV:         32 LV SV Index:   15 LVOT Area:     4.15 cm LV IVRT:       77 msec  LV Volumes (MOD) LV vol d, MOD A2C: 193.0 ml LV vol d, MOD A4C: 168.0 ml LV vol s, MOD A2C: 144.0 ml LV vol s, MOD A4C: 123.0 ml LV SV MOD A2C:     49.0 ml LV SV MOD A4C:     168.0 ml LV SV MOD BP:      47.5 ml RIGHT VENTRICLE             IVC RV Basal diam:  3.65 cm     IVC diam: 2.40 cm RV S prime:     12.00 cm/s TAPSE (M-mode): 2.4 cm LEFT ATRIUM              Index        RIGHT ATRIUM           Index LA diam:        5.50 cm  2.67 cm/m   RA Area:     23.20 cm LA Vol (A2C):   81.5 ml  39.58 ml/m  RA Volume:   77.50 ml  37.64 ml/m LA Vol (A4C):   102.0 ml 49.54 ml/m LA  Biplane Vol: 98.3 ml  47.74 ml/m  AORTIC VALVE LVOT Vmax:   54.80 cm/s LVOT Vmean:  34.500 cm/s LVOT VTI:    0.076 m  AORTA Ao Root diam: 3.00 cm Ao Asc diam:  2.90 cm MITRAL VALVE               TRICUSPID VALVE MV Area (PHT): 7.16 cm    TR Peak grad:   50.7 mmHg MV Decel Time: 106 msec    TR Vmax:        356.00 cm/s MR PISA:        2.26 cm MR PISA Radius: 0.60 cm    SHUNTS MV E velocity: 81.20 cm/s  Systemic VTI:  0.08 m MV A velocity: 37.90 cm/s  Systemic Diam: 2.30 cm MV E/A ratio:  2.14 Toribio Fuel MD Electronically signed by Toribio Fuel MD Signature Date/Time: 05/03/2024/10:50:56 PM    Final    US  Abdomen Limited RUQ (LIVER/GB) Result Date: 05/02/2024 EXAM: Right Upper Quadrant Abdominal Ultrasound 05/02/2024 05:01:00 PM TECHNIQUE: Real-time ultrasonography of the right upper quadrant of the abdomen was performed. COMPARISON: CT abdomen and pelvis 05/02/2024. CLINICAL HISTORY: Upper abdominal pain. FINDINGS: LIVER: Normal echogenicity. Circumscribed focal hyperechoic liver lesions are demonstrated with the largest in the left lobe measuring 2 cm in maximal diameter, and the  right lobe measuring 1.1 cm. The appearance is consistent with cavernous hemangiomas. No intrahepatic biliary ductal dilatation. Hepatopetal flow in the portal vein. BILIARY SYSTEM: Cholelithiasis with multiple stones in the gallbladder. Gallbladder wall thickening to 0.7 cm. Pericholecystic edema. Murphy's sign is positive. Changes are consistent with acute cholecystitis in the appropriate clinical setting. The Common Bile Duct measures 0.3 cm. No bile duct dilatation. RIGHT KIDNEY: No hydronephrosis. No echogenic calculi. No mass. PANCREAS: Visualized portions of the pancreas are unremarkable. OTHER: No right upper quadrant ascites. IMPRESSION: 1. Acute cholecystitis with cholelithiasis. 2. No bile duct dilatation. Electronically signed by: Elsie Gravely MD 05/02/2024 05:09 PM EST RP Workstation: [id number]   CT ABDOMEN  PELVIS W CONTRAST Result Date: 05/02/2024 EXAM: CT ABDOMEN AND PELVIS WITH CONTRAST 05/02/2024 03:51:04 PM TECHNIQUE: CT of the abdomen and pelvis was performed with the administration of 100 mL of iohexol  (OMNIPAQUE ) 300 MG/ML solution. Multiplanar reformatted images are provided for review. Automated exposure control, iterative reconstruction, and/or weight-based adjustment of the mA/kV was utilized to reduce the radiation dose to as low as reasonably achievable. COMPARISON: Comparison with 07/18/2020. CLINICAL HISTORY: Upper abdominal pain, nausea, constipation, congestive heart failure. FINDINGS: LOWER CHEST: Trace bilateral pleural effusions. Patchy ground-glass infiltrates in the lung bases may represent edema or pneumonia. Cardiac enlargement. LIVER: Mild diffuse fatty infiltration of the liver. No focal liver lesions. GALLBLADDER AND BILE DUCTS: Cholelithiasis with gallbladder wall thickening and pericholecystic edema. Changes likely to indicate acute cholecystitis in the appropriate clinical setting. No bile duct dilatation. SPLEEN: The spleen is normal. PANCREAS: Pancreas is unremarkable. ADRENAL GLANDS: Adrenal glands are unremarkable. KIDNEYS, URETERS AND BLADDER: Kidneys, ureters, and the bladder are unremarkable. No stones in the kidneys or ureters. No hydronephrosis. No perinephric or periureteral stranding. Urinary bladder is unremarkable. GI AND BOWEL: Stomach, small bowel, and colon are not abnormally distended. No wall thickening or inflammatory stranding identified. Appendix is normal. There is no bowel obstruction. PERITONEUM AND RETROPERITONEUM: A small amount of free fluid in the pelvis could be reactive or physiologic. Small amount of free fluid in the right paracolic gutter is likely reactive. No free air. VASCULATURE: Normal caliber abdominal aorta. No dissection. LYMPH NODES: No significant lymphadenopathy. REPRODUCTIVE ORGANS: The uterus and ovaries are not significantly enlarged.  Nodular enhancement in the uterus consistent with fibroid. An intrauterine device is present with central positioning. BONES AND SOFT TISSUES: No acute bony abnormalities. No focal soft tissue abnormality. IMPRESSION: 1. Cholelithiasis with gallbladder wall thickening and pericholecystic edema, consistent with acute cholecystitis. No bile duct dilatation. 2. Trace bilateral pleural effusions with patchy ground-glass opacities in the lung bases, which may reflect edema or pneumonia. 3. Cardiomegaly. Electronically signed by: Elsie Gravely MD 05/02/2024 03:59 PM EST RP Workstation: [id number]   DG Chest 2 View Result Date: 05/02/2024 EXAM: 2 VIEW(S) XRAY OF THE CHEST 05/02/2024 02:47:21 PM COMPARISON: None available. CLINICAL HISTORY: Shortness of breath. FINDINGS: LINES, TUBES AND DEVICES: Neurostimulator device battery pack overlies right chest. Left chest AICD in place. LUNGS AND PLEURA: Indistinct pulmonary vasculature favoring pulmonary venous hypertension. No overt airspace edema. No blunting of the costophrenic angles. No focal pulmonary opacity. No pleural effusion. No pneumothorax. Lateral view blurred by motion artifact. HEART AND MEDIASTINUM: Cardiomegaly. BONES AND SOFT TISSUES: No acute osseous abnormality. IMPRESSION: 1. Indistinct pulmonary vasculature favoring pulmonary venous hypertension. No overt airspace edema or blunting of the costophrenic angles. 2. Cardiomegaly. 3. Left chest AICD and right chest neurostimulator device battery pack. Electronically signed by: Ryan Salvage MD 05/02/2024 03:10 PM  EST RP Workstation: [id number]    Microbiology: Recent Results (from the past 240 hours)  MRSA Next Gen by PCR, Nasal     Status: None   Collection Time: 05/04/24  9:28 AM   Specimen: Nasal Mucosa; Nasal Swab  Result Value Ref Range Status   MRSA by PCR Next Gen NOT DETECTED NOT DETECTED Final    Comment: (NOTE) The GeneXpert MRSA Assay (FDA approved for NASAL specimens only), is one  component of a comprehensive MRSA colonization surveillance program. It is not intended to diagnose MRSA infection nor to guide or monitor treatment for MRSA infections. Test performance is not FDA approved in patients less than 39 years old. Performed at [facility], [address], [place], [place] [address]      Labs: Basic Metabolic Panel: Recent Labs  Lab 05/02/24 1521 05/03/24 0237 05/04/24 0044 05/05/24 0246 05/06/24 0323 05/07/24 0239 05/08/24 0254  NA  --    < > 140 138 139  139 140 137  K  --    < > 4.0 4.3 4.1  3.9 4.0 4.0  CL  --    < > 104 102 103  105 105 101  CO2  --    < > 28 26 21  27 25 28   GLUCOSE  --    < > 99 118* 107*  83 93 93  BUN  --    < > 8 8 7  7 8 9   CREATININE  --    < > 0.88 0.71 0.65  0.73 0.80 0.87  CALCIUM   --    < > 8.4* 8.9 8.4*  8.7* 8.2* 8.8*  MG 1.7  --   --   --  2.0  --  2.0   < > = values in this interval not displayed.   Liver Function Tests: Recent Labs  Lab 05/02/24 1454 05/03/24 0237 05/05/24 0246 05/06/24 0323  AST 35 29 34 25  ALT 47* 43 43 32  ALKPHOS 53 45 47 53  BILITOT 0.9 0.7 0.6 0.3  PROT 6.7 5.7* 6.0* 5.3*  ALBUMIN 4.3 3.7 3.7 3.4*   Recent Labs  Lab 05/02/24 1454  LIPASE 17   No results for input(s): AMMONIA in the last 168 hours. CBC: Recent Labs  Lab 05/02/24 1454 05/03/24 0237 05/05/24 0246 05/06/24 0323 05/07/24 0239  WBC 6.5 5.7 8.6 10.1 8.9  HGB 11.1* 10.1* 11.4* 11.1* 10.3*  HCT 35.0* 30.9* 34.8* 34.5* 32.1*  MCV 85.8 84.2 84.3 85.2 86.8  PLT 165 165 183 188 164   Cardiac Enzymes: No results for input(s): CKTOTAL, CKMB, CKMBINDEX, TROPONINI in the last 168 hours. BNP: BNP (last 3 results) Recent Labs    06/14/23 1254 10/22/23 1202 05/06/24 0323  BNP 791.5* 774.2* 745.8*    ProBNP (last 3 results) Recent Labs    05/02/24 1454  PROBNP 3,347.0*    CBG: No results for input(s): GLUCAP in the last 168 hours.     Signed:  Sigurd Pac MD.   [facility] 05/08/2024, 2:44 PM        [1] No Known Allergies

## 2024-05-08 NOTE — Progress Notes (Signed)
 Provided patient education on Furoscix  using demo kits and Furoscix  video, QR code provided on AVS for further viewing. Letter with instructions for use provided, f/u appt sch for 2/3. Pt aware, agreeable, and verbalized understanding.  Powell Latino, RN, BSN, [facility] Specialty Coordinator [facility]

## 2024-05-08 NOTE — Progress Notes (Signed)
 " PROGRESS NOTE    Tara Bradshaw  FMW:[id number] DOB: Jul 16, 1985 DOA: 05/02/2024 PCP: Lendia Boby CROME, NP-C  39/F with chronic systolic CHF, NICM, AICD, GERD, PVCs presented to [facility] with upper abdominal pain - Further workup she was noted to have acute cholecystitis - Seen by general surgery in consultation, underwent lap chole on 1/26 - Postop course complicated by CHF exacerbation, cards following, slowly restarting GDMT, limited by BP - RHC RA 12 wedge 25, CI 2.1  Subjective: - Feels better, urinated more yesterday, off oxygen this morning  Assessment and Plan:  Acute cholecystitis - Status post lap chole on 1/26 - Slowly improving - Cleared by general surgery for discharge home, follow-up recommended  Acute on chronic systolic CHF, severe BiV failure Severe pulmonary hypertension Moderate to severe mitral regurgitation - Last echo 1/24 noted EF< 20%, grade 2 DD, moderately reduced RV, severely elevated PASP, mitral regurgitation - Cards following, - Did not get any Entresto  dose yesterday with low blood pressure -Diuresed well on oral Lasix  yesterday - Blood pressure still soft but overall improving, RHC today with RA 12 wedge 25, cardiac index 2.1 - Started on Lasix  80 mg twice daily, continue Jardiance , GDMT limited by hypotension  Moderate to severe mitral regurgitation - Needs follow-up  Obesity (HCC) - on Zepbound   ICD (implantable cardioverter-defibrillator) in place Noted. Monitor on telemetry.  GERD (gastroesophageal reflux disease) Resume PPI  DVT prophylaxis: SCDs, add Lovenox  Code Status: Full code Family Communication: No family at bedside Disposition Plan: Home pending stabilization and blood pressure and volume status  Antimicrobials:    Objective: Vitals:   05/08/24 1030 05/08/24 1045 05/08/24 1100 05/08/24 1130  BP: 97/68 108/75 107/78 106/84  Pulse:      Resp:      Temp:      TempSrc:      SpO2:      Weight:      Height:         Intake/Output Summary (Last 24 hours) at 05/08/2024 1220 Last data filed at 05/08/2024 0609 Gross per 24 hour  Intake 240 ml  Output 4000 ml  Net -3760 ml   Filed Weights   05/07/24 0300 05/07/24 2219 05/08/24 0443  Weight: 107.9 kg 107.6 kg 107.6 kg    Examination:  General exam: Pleasant, AO x 3, no distress HEENT positive JVD Respiratory system: Decreased breath sounds to bases Cardiovascular system: S1 & S2 heard, RRR.  Abd: Soft, abdomen mildly tender, incisional sites unremarkable Central nervous system: Alert and oriented. No focal neurological deficits. Extremities: Trace edema Skin: No rashes Psychiatry:  Mood & affect appropriate.     Data Reviewed:   CBC: Recent Labs  Lab 05/02/24 1454 05/03/24 0237 05/05/24 0246 05/06/24 0323 05/07/24 0239  WBC 6.5 5.7 8.6 10.1 8.9  HGB 11.1* 10.1* 11.4* 11.1* 10.3*  HCT 35.0* 30.9* 34.8* 34.5* 32.1*  MCV 85.8 84.2 84.3 85.2 86.8  PLT 165 165 183 188 164   Basic Metabolic Panel: Recent Labs  Lab 05/02/24 1521 05/03/24 0237 05/04/24 0044 05/05/24 0246 05/06/24 0323 05/07/24 0239 05/08/24 0254  NA  --    < > 140 138 139  139 140 137  K  --    < > 4.0 4.3 4.1  3.9 4.0 4.0  CL  --    < > 104 102 103  105 105 101  CO2  --    < > 28 26 21  27 25 28   GLUCOSE  --    < >  99 118* 107*  83 93 93  BUN  --    < > 8 8 7  7 8 9   CREATININE  --    < > 0.88 0.71 0.65  0.73 0.80 0.87  CALCIUM   --    < > 8.4* 8.9 8.4*  8.7* 8.2* 8.8*  MG 1.7  --   --   --  2.0  --  2.0   < > = values in this interval not displayed.   GFR: Estimated Creatinine Clearance: 99.2 mL/min (by C-G formula based on SCr of 0.87 mg/dL). Liver Function Tests: Recent Labs  Lab 05/02/24 1454 05/03/24 0237 05/05/24 0246 05/06/24 0323  AST 35 29 34 25  ALT 47* 43 43 32  ALKPHOS 53 45 47 53  BILITOT 0.9 0.7 0.6 0.3  PROT 6.7 5.7* 6.0* 5.3*  ALBUMIN 4.3 3.7 3.7 3.4*   Recent Labs  Lab 05/02/24 1454  LIPASE 17   No results for  input(s): AMMONIA in the last 168 hours. Coagulation Profile: Recent Labs  Lab 05/03/24 0237  INR 1.2   Cardiac Enzymes: No results for input(s): CKTOTAL, CKMB, CKMBINDEX, TROPONINI in the last 168 hours. BNP (last 3 results) Recent Labs    05/02/24 1454  PROBNP 3,347.0*   HbA1C: No results for input(s): HGBA1C in the last 72 hours. CBG: No results for input(s): GLUCAP in the last 168 hours. Lipid Profile: No results for input(s): CHOL, HDL, LDLCALC, TRIG, CHOLHDL, LDLDIRECT in the last 72 hours. Thyroid  Function Tests: No results for input(s): TSH, T4TOTAL, FREET4, T3FREE, THYROIDAB in the last 72 hours. Anemia Panel: No results for input(s): VITAMINB12, FOLATE, FERRITIN, TIBC, IRON, RETICCTPCT in the last 72 hours. Urine analysis:    Component Value Date/Time   COLORURINE YELLOW 11/10/2022 1830   APPEARANCEUR HAZY (A) 11/10/2022 1830   LABSPEC 1.025 11/10/2022 1830   PHURINE 5.0 11/10/2022 1830   GLUCOSEU NEGATIVE 11/10/2022 1830   HGBUR NEGATIVE 11/10/2022 1830   BILIRUBINUR NEGATIVE 11/10/2022 1830   BILIRUBINUR neg 04/21/2012 1530   KETONESUR NEGATIVE 11/10/2022 1830   PROTEINUR NEGATIVE 11/10/2022 1830   UROBILINOGEN 1.0 11/26/2016 1305   NITRITE NEGATIVE 11/10/2022 1830   LEUKOCYTESUR NEGATIVE 11/10/2022 1830   Sepsis Labs: @LABRCNTIP (procalcitonin:4,lacticidven:4)  ) Recent Results (from the past 240 hours)  MRSA Next Gen by PCR, Nasal     Status: None   Collection Time: 05/04/24  9:28 AM   Specimen: Nasal Mucosa; Nasal Swab  Result Value Ref Range Status   MRSA by PCR Next Gen NOT DETECTED NOT DETECTED Final    Comment: (NOTE) The GeneXpert MRSA Assay (FDA approved for NASAL specimens only), is one component of a comprehensive MRSA colonization surveillance program. It is not intended to diagnose MRSA infection nor to guide or monitor treatment for MRSA infections. Test performance is not FDA approved in  patients less than 12 years old. Performed at [facility], [address], [place], [place] [address]      Radiology Studies: CARDIAC CATHETERIZATION Result Date: 05/08/2024 1.  Fick cardiac output of 4.25 L/min and Fick cardiac index of 2.06 L/min/m with the following hemodynamics:  Right atrial pressure mean of 12 mmHg  Right ventricular pressure 60/5 with end-diastolic pressure of 13 mmHg  Wedge pressure mean of 25 mmHg with V waves to 37 mmHg  PA pressure of 60/31 with a mean of 44 mmHg  PVR 4.5 Woods units  PA pulsatility index of 2.4  PA saturation 51% Summary: Elevated filling  pressures with mildly reduced cardiac output and index.     Scheduled Meds:  docusate sodium   100 mg Oral BID   empagliflozin   10 mg Oral Daily   furosemide   80 mg Intravenous BID   gabapentin   300 mg Oral QHS   pantoprazole   40 mg Oral Daily   sodium chloride  flush  3 mL Intravenous Q12H   Continuous Infusions:  sodium chloride        LOS: 5 days    Time spent:    Sigurd Pac, MD [facility]   05/08/2024, 12:20 PM    "

## 2024-05-08 NOTE — Plan of Care (Signed)
   Problem: Education: Goal: Knowledge of General Education information will improve Description: Including pain rating scale, medication(s)/side effects and non-pharmacologic comfort measures Outcome: Progressing   Problem: Nutrition: Goal: Adequate nutrition will be maintained Outcome: Progressing   Problem: Coping: Goal: Level of anxiety will decrease Outcome: Progressing

## 2024-05-09 ENCOUNTER — Ambulatory Visit (HOSPITAL_COMMUNITY): Payer: Self-pay | Admitting: Cardiology

## 2024-05-09 ENCOUNTER — Encounter (HOSPITAL_COMMUNITY): Payer: Self-pay | Admitting: Internal Medicine

## 2024-05-11 ENCOUNTER — Telehealth: Payer: Self-pay

## 2024-05-11 ENCOUNTER — Telehealth (HOSPITAL_COMMUNITY): Payer: Self-pay

## 2024-05-11 ENCOUNTER — Other Ambulatory Visit (HOSPITAL_COMMUNITY): Payer: Self-pay

## 2024-05-11 DIAGNOSIS — I509 Heart failure, unspecified: Secondary | ICD-10-CM

## 2024-05-11 NOTE — Telephone Encounter (Signed)
 Called to confirm/remind patient of their appointment at [facility] on 05/12/24.   Appointment:   [] Confirmed  [] Left mess   [x] No answer/No voice mail  [] VM Full/unable to leave message  [] Phone not in service  And to bring in all medications and/or complete list.

## 2024-05-11 NOTE — Telephone Encounter (Signed)
 Patient left message on after hours line over the weekend stating  having cramps in her hand and feet,wants to speak to on call for some advise. Called the patient she stated it has gotten better. She has follow up tomorrow afternoon with the APP.

## 2024-05-12 ENCOUNTER — Encounter (HOSPITAL_COMMUNITY): Payer: Self-pay

## 2024-05-12 ENCOUNTER — Inpatient Hospital Stay (HOSPITAL_COMMUNITY)
Admission: RE | Admit: 2024-05-12 | Discharge: 2024-05-12 | Disposition: A | Source: Ambulatory Visit | Attending: Family Medicine | Admitting: Family Medicine

## 2024-05-12 ENCOUNTER — Telehealth (HOSPITAL_COMMUNITY): Payer: Self-pay

## 2024-05-12 ENCOUNTER — Other Ambulatory Visit (HOSPITAL_COMMUNITY): Payer: Self-pay | Admitting: Family Medicine

## 2024-05-12 ENCOUNTER — Telehealth: Payer: Self-pay

## 2024-05-12 ENCOUNTER — Ambulatory Visit (HOSPITAL_COMMUNITY): Payer: Self-pay | Admitting: Family Medicine

## 2024-05-12 ENCOUNTER — Inpatient Hospital Stay (HOSPITAL_COMMUNITY): Admit: 2024-05-12 | Discharge: 2024-05-12 | Attending: Family Medicine

## 2024-05-12 VITALS — BP 104/78 | HR 87 | Wt 231.0 lb

## 2024-05-12 DIAGNOSIS — I493 Ventricular premature depolarization: Secondary | ICD-10-CM

## 2024-05-12 DIAGNOSIS — I10 Essential (primary) hypertension: Secondary | ICD-10-CM

## 2024-05-12 DIAGNOSIS — I38 Endocarditis, valve unspecified: Secondary | ICD-10-CM | POA: Diagnosis not present

## 2024-05-12 DIAGNOSIS — G4733 Obstructive sleep apnea (adult) (pediatric): Secondary | ICD-10-CM | POA: Diagnosis not present

## 2024-05-12 DIAGNOSIS — I5022 Chronic systolic (congestive) heart failure: Secondary | ICD-10-CM

## 2024-05-12 LAB — BASIC METABOLIC PANEL WITH GFR
Anion gap: 12 (ref 5–15)
BUN: 18 mg/dL (ref 6–20)
CO2: 21 mmol/L — ABNORMAL LOW (ref 22–32)
Calcium: 9.6 mg/dL (ref 8.9–10.3)
Chloride: 102 mmol/L (ref 98–111)
Creatinine, Ser: 0.76 mg/dL (ref 0.44–1.00)
GFR, Estimated: >60 mL/min
Glucose, Bld: 92 mg/dL (ref 70–99)
Potassium: 4.5 mmol/L (ref 3.5–5.1)
Sodium: 135 mmol/L (ref 135–145)

## 2024-05-12 LAB — PRO BRAIN NATRIURETIC PEPTIDE: Pro Brain Natriuretic Peptide: 1234 pg/mL — ABNORMAL HIGH

## 2024-05-12 LAB — MAGNESIUM: Magnesium: 2 mg/dL (ref 1.7–2.4)

## 2024-05-12 MED ORDER — TORSEMIDE 20 MG PO TABS: 40.0000 mg | ORAL_TABLET | ORAL | Status: AC | PRN

## 2024-05-12 MED ORDER — POTASSIUM CHLORIDE CRYS ER 20 MEQ PO TBCR: 40.0000 meq | EXTENDED_RELEASE_TABLET | ORAL | Status: AC | PRN

## 2024-05-12 MED ORDER — DAPAGLIFLOZIN PROPANEDIOL 10 MG PO TABS: 10.0000 mg | ORAL_TABLET | Freq: Every day | ORAL | 1 refills | Status: AC

## 2024-05-12 NOTE — Progress Notes (Unsigned)
 Complex Care Management Note Care Guide Note  05/12/2024 Name: Tara Bradshaw MRN: [id number] DOB: 1986/01/28   Complex Care Management Outreach Attempts: An unsuccessful telephone outreach was attempted today to offer the patient information about available complex care management services.  Follow Up Plan:  Additional outreach attempts will be made to offer the patient complex care management information and services.   Encounter Outcome:  No Answer  Doyce [facility]  [facility], [facility] Guide Direct Dial: [phone]  Fax: [phone]

## 2024-05-12 NOTE — Patient Instructions (Addendum)
 Good to see you today!  RESTART Farxiga  10 mg daily  Change torsemide  40 mg to as needed  Change potassium 40 meq to as needed  Your provider has recommended that  you wear a Zio Patch for 14 days.  This monitor will record your heart rhythm for our review.  IF you have any symptoms while wearing the monitor please press the button.  If you have any issues with the patch or you notice a red or orange light on it please call the company at [phone].  Once you remove the patch please mail it back to the company as soon as possible so we can get the results.  Labs done today, your results will be available in MyChart, we will contact you for abnormal readings.  Your physician recommends that you schedule a follow-up appointment  on 2/10 /2026  If you have any questions or concerns before your next appointment please send us  a message through [place] or call our office at [phone].    TO LEAVE A MESSAGE FOR THE NURSE SELECT OPTION 2, PLEASE LEAVE A MESSAGE INCLUDING: YOUR NAME DATE OF BIRTH CALL BACK NUMBER REASON FOR CALL**this is important as we prioritize the call backs  YOU WILL RECEIVE A CALL BACK THE SAME DAY AS LONG AS YOU CALL BEFORE 4:00 PM At the [facility], you and your health needs are our priority. As part of our continuing mission to provide you with exceptional heart care, we have created designated Provider Care Teams. These Care Teams include your primary Cardiologist (physician) and Advanced Practice Providers (APPs- Physician Assistants and Nurse Practitioners) who all work together to provide you with the care you need, when you need it.   You may see any of the following providers on your designated Care Team at your next follow up: Dr Toribio Fuel Dr Ezra Shuck Dr. Morene Brownie Greig Mosses, NP Caffie Shed, [place] [facility] [place], [place] Beckey Coe, NP Jordan Lee, NP Ellouise Class, NP Tinnie Redman, PharmD Jaun Bash, PharmD   Please be sure to bring in all your medications bottles to every appointment.    Thank you for choosing [facility]

## 2024-05-13 NOTE — Progress Notes (Signed)
 Complex Care Management Note  Care Guide Note 05/13/2024 Name: Tara Bradshaw MRN: [id number] DOB: July 08, 1985  Tara Bradshaw is a 39 y.o. year old female who sees Christiana, [place] L, NP-C for primary care. I reached out to Brande Mirabal by phone today to offer complex care management services.  [name] was given information about Complex Care Management services today including:   The Complex Care Management services include support from the care team which includes your Nurse Care Manager, Clinical Social Worker, or Pharmacist.  The Complex Care Management team is here to help remove barriers to the health concerns and goals most important to you. Complex Care Management services are voluntary, and the patient may decline or stop services at any time by request to their care team member.   Complex Care Management Consent Status: Patient agreed to services and verbal consent obtained.   Follow up plan:  Telephone appointment with complex care management team member scheduled for:  06/01/24  Encounter Outcome:  Patient Scheduled

## 2024-05-14 ENCOUNTER — Ambulatory Visit

## 2024-05-14 ENCOUNTER — Inpatient Hospital Stay: Admitting: Family Medicine

## 2024-05-18 ENCOUNTER — Inpatient Hospital Stay: Admitting: Family Medicine

## 2024-05-19 ENCOUNTER — Ambulatory Visit (HOSPITAL_COMMUNITY)

## 2024-05-19 ENCOUNTER — Encounter

## 2024-05-26 ENCOUNTER — Ambulatory Visit: Admitting: Student

## 2024-05-29 ENCOUNTER — Ambulatory Visit: Payer: 59

## 2024-06-01 ENCOUNTER — Telehealth

## 2024-08-03 ENCOUNTER — Ambulatory Visit

## 2024-11-02 ENCOUNTER — Ambulatory Visit

## 2025-02-01 ENCOUNTER — Ambulatory Visit

## 2025-03-03 ENCOUNTER — Encounter: Admitting: Family Medicine

## 2025-03-03 ENCOUNTER — Ambulatory Visit

## 2025-05-03 ENCOUNTER — Ambulatory Visit
